# Patient Record
Sex: Female | Born: 1950
Health system: Southern US, Community
[De-identification: ages and names within clinical notes are randomized; demographics above are authoritative.]

## PROBLEM LIST (undated history)

## (undated) DIAGNOSIS — R519 Headache, unspecified: Secondary | ICD-10-CM

## (undated) DIAGNOSIS — C9000 Multiple myeloma not having achieved remission: Secondary | ICD-10-CM

## (undated) DIAGNOSIS — R51 Headache: Secondary | ICD-10-CM

## (undated) DIAGNOSIS — K56609 Unspecified intestinal obstruction, unspecified as to partial versus complete obstruction: Secondary | ICD-10-CM

## (undated) DIAGNOSIS — M545 Low back pain, unspecified: Secondary | ICD-10-CM

## (undated) DIAGNOSIS — D472 Monoclonal gammopathy: Secondary | ICD-10-CM

## (undated) HISTORY — PX: HERNIA REPAIR: SHX51

## (undated) HISTORY — PX: ABDOMINAL HYSTERECTOMY: SHX81

## (undated) HISTORY — PX: GASTRIC BYPASS: SHX52

## (undated) HISTORY — PX: BACK SURGERY: SHX140

## (undated) HISTORY — PX: TONSILLECTOMY: SUR1361

---

## 2008-10-15 DIAGNOSIS — K56609 Unspecified intestinal obstruction, unspecified as to partial versus complete obstruction: Secondary | ICD-10-CM

## 2008-10-15 HISTORY — DX: Unspecified intestinal obstruction, unspecified as to partial versus complete obstruction: K56.609

## 2009-06-04 HISTORY — PX: KNEE SURGERY: SHX244

## 2012-10-15 HISTORY — PX: OTHER SURGICAL HISTORY: SHX169

## 2014-03-04 ENCOUNTER — Ambulatory Visit: Payer: Self-pay | Admitting: Oncology

## 2014-03-09 LAB — URINE IEP, RANDOM

## 2014-03-10 LAB — KAPPA/LAMBDA FREE LIGHT CHAINS (ARMC)

## 2014-03-10 LAB — PROT IMMUNOELECTROPHORES(ARMC)

## 2014-03-15 ENCOUNTER — Ambulatory Visit: Payer: Self-pay | Admitting: Oncology

## 2014-05-26 ENCOUNTER — Encounter (HOSPITAL_COMMUNITY): Payer: Self-pay | Admitting: Emergency Medicine

## 2014-05-26 ENCOUNTER — Emergency Department (HOSPITAL_COMMUNITY)
Admission: EM | Admit: 2014-05-26 | Discharge: 2014-05-26 | Disposition: A | Discharge status: Home / Self Care | Attending: Emergency Medicine | Admitting: Emergency Medicine

## 2014-05-26 ENCOUNTER — Emergency Department (HOSPITAL_COMMUNITY)

## 2014-05-26 DIAGNOSIS — Z8719 Personal history of other diseases of the digestive system: Secondary | ICD-10-CM | POA: Insufficient documentation

## 2014-05-26 DIAGNOSIS — R519 Headache, unspecified: Secondary | ICD-10-CM

## 2014-05-26 DIAGNOSIS — R51 Headache: Secondary | ICD-10-CM | POA: Insufficient documentation

## 2014-05-26 DIAGNOSIS — Z8679 Personal history of other diseases of the circulatory system: Secondary | ICD-10-CM | POA: Insufficient documentation

## 2014-05-26 HISTORY — DX: Multiple myeloma not having achieved remission: C90.00

## 2014-05-26 HISTORY — DX: Unspecified intestinal obstruction, unspecified as to partial versus complete obstruction: K56.609

## 2014-05-26 HISTORY — DX: Monoclonal gammopathy: D47.2

## 2014-05-26 LAB — CBC WITH DIFFERENTIAL/PLATELET
BASOS ABS: 0 10*3/uL (ref 0.0–0.1)
Basophils Relative: 1 % (ref 0–1)
Eosinophils Absolute: 0.1 10*3/uL (ref 0.0–0.7)
Eosinophils Relative: 3 % (ref 0–5)
HEMATOCRIT: 34.9 % — AB (ref 36.0–46.0)
HEMOGLOBIN: 11.9 g/dL — AB (ref 12.0–15.0)
LYMPHS PCT: 47 % — AB (ref 12–46)
Lymphs Abs: 1.5 10*3/uL (ref 0.7–4.0)
MCH: 32.2 pg (ref 26.0–34.0)
MCHC: 34.1 g/dL (ref 30.0–36.0)
MCV: 94.3 fL (ref 78.0–100.0)
Monocytes Absolute: 0.2 10*3/uL (ref 0.1–1.0)
Monocytes Relative: 6 % (ref 3–12)
NEUTROS ABS: 1.3 10*3/uL — AB (ref 1.7–7.7)
Neutrophils Relative %: 43 % (ref 43–77)
Platelets: 151 10*3/uL (ref 150–400)
RBC: 3.7 MIL/uL — AB (ref 3.87–5.11)
RDW: 12.5 % (ref 11.5–15.5)
WBC: 3.1 10*3/uL — AB (ref 4.0–10.5)

## 2014-05-26 LAB — COMPREHENSIVE METABOLIC PANEL
ALT: 31 U/L (ref 0–35)
AST: 30 U/L (ref 0–37)
Albumin: 3.8 g/dL (ref 3.5–5.2)
Alkaline Phosphatase: 95 U/L (ref 39–117)
Anion gap: 10 (ref 5–15)
BILIRUBIN TOTAL: 0.6 mg/dL (ref 0.3–1.2)
BUN: 13 mg/dL (ref 6–23)
CHLORIDE: 105 meq/L (ref 96–112)
CO2: 25 meq/L (ref 19–32)
Calcium: 9.4 mg/dL (ref 8.4–10.5)
Creatinine, Ser: 0.9 mg/dL (ref 0.50–1.10)
GFR calc Af Amer: 78 mL/min — ABNORMAL LOW (ref >90)
GFR calc non Af Amer: 68 mL/min — ABNORMAL LOW (ref >90)
Glucose, Bld: 95 mg/dL (ref 70–99)
Potassium: 4.3 mEq/L (ref 3.7–5.3)
Sodium: 140 mEq/L (ref 137–147)
Total Protein: 7.3 g/dL (ref 6.0–8.3)

## 2014-05-26 MED ORDER — PREDNISONE 10 MG PO TABS: 20.0000 mg | ORAL_TABLET | Freq: Every day | ORAL | Status: DC

## 2014-05-26 NOTE — ED Provider Notes (Signed)
CSN: [id number]     Arrival date & time 05/26/14  4010 History  This chart was scribed for Madison Diego, MD by Starleen Arms, ED Scribe. This patient was seen in room APA04/APA04 and the patient's care was started at 9:06 AM.   Chief Complaint  Patient presents with  . Headache   Patient is a 63 y.o. female presenting with headaches. The history is provided by the patient. No language interpreter was used.  Headache Pain location:  R temporal Quality:  Sharp Radiates to:  R neck (right ear) Onset quality:  Gradual Duration:  1 day Timing:  Intermittent Progression:  Unchanged Chronicity:  New Similar to prior headaches: no (hx of migraines)   Relieved by:  NSAIDs Worsened by:  Nothing tried Associated symptoms: no abdominal pain, no back pain, no congestion, no cough, no diarrhea, no fatigue, no nausea, no photophobia, no seizures, no sinus pressure and no vomiting     HPI Comments: Carmeline Kowal is a 63 y.o. female with a history of migraines who presents to the Emergency Department complaining of intermittent (3 to 4 second episodes) of right temporal tenderness onset yesterday with associated "sharp waves" of pain that radiate to her left neck.  She states the radiation has changed to "shock waves" moving to her ears from her temple.  She also describes the pain as a light pressure.   Past Medical History  Diagnosis Date  . SBO (small bowel obstruction)    Past Surgical History  Procedure Laterality Date  . Knee surgery      both  . Abdominal hysterectomy    . Gastric bypass    . Hernia repair    . Abdominal surgery     No family history on file. History  Substance Use Topics  . Smoking status: Never Smoker   . Smokeless tobacco: Not on file  . Alcohol Use: Yes     Comment: occ   OB History   Grav Para Term Preterm Abortions TAB SAB Ect Mult Living                 Review of Systems  Constitutional: Negative for appetite change and fatigue.  HENT: Negative  for congestion, ear discharge and sinus pressure.   Eyes: Negative for photophobia, discharge and visual disturbance.  Respiratory: Negative for cough.   Cardiovascular: Negative for chest pain.  Gastrointestinal: Negative for nausea, vomiting, abdominal pain and diarrhea.  Genitourinary: Negative for frequency and hematuria.  Musculoskeletal: Negative for back pain.  Skin: Negative for rash.  Neurological: Positive for headaches. Negative for seizures.  Psychiatric/Behavioral: Negative for hallucinations.      Allergies  Review of patient's allergies indicates no known allergies.  Home Medications   Prior to Admission medications   Not on File   BP 165/95  Pulse 94  Temp(Src) 97.9 F (36.6 C) (Oral)  Resp 17  Ht 5\' 3"  (1.6 m)  Wt 230 lb (104.327 kg)  BMI 40.75 kg/m2  SpO2 99% Physical Exam  Nursing note and vitals reviewed. Constitutional: She is oriented to person, place, and time. She appears well-developed.  HENT:  Head: Normocephalic.  Eyes: Conjunctivae and EOM are normal. No scleral icterus.  Neck: Neck supple. No thyromegaly present.  Cardiovascular: Normal rate and regular rhythm.  Exam reveals no gallop and no friction rub.   No murmur heard. Pulmonary/Chest: No stridor. She has no wheezes. She has no rales. She exhibits no tenderness.  Abdominal: She exhibits no distension. There is  no tenderness. There is no rebound.  Musculoskeletal: Normal range of motion. She exhibits no edema.  Lymphadenopathy:    She has no cervical adenopathy.  Neurological: She is oriented to person, place, and time. She exhibits normal muscle tone. Coordination normal.  Skin: No rash noted. No erythema.  Psychiatric: She has a normal mood and affect. Her behavior is normal.    ED Course  Procedures (including critical care time)  DIAGNOSTIC STUDIES: Oxygen Saturation is 99% on RA, normal by my interpretation.    COORDINATION OF CARE:  9:13 AM Will order labs and imaging.   Patient acknowledges and agrees with plan.     Labs Review Labs Reviewed  CBC WITH DIFFERENTIAL  COMPREHENSIVE METABOLIC PANEL    Imaging Review No results found.   EKG Interpretation None      MDM   Final diagnoses:  None    The chart was scribed for me under my direct supervision.  I personally performed the history, physical, and medical decision making and all procedures in the evaluation of this patient.Madison Diego, MD 05/26/14 [phone]

## 2014-05-26 NOTE — ED Notes (Signed)
Pain and pressure to right side of head/temple starting yesterday.  Took ibuprofen with some relief, but did not go completely away.  Denies visual disturbances, denies n/v, denies light/sound sensitivity.

## 2014-05-26 NOTE — Discharge Instructions (Signed)
Follow up as needed with your md °

## 2014-06-03 ENCOUNTER — Ambulatory Visit: Payer: Self-pay | Admitting: Oncology

## 2014-06-03 LAB — BASIC METABOLIC PANEL
Anion Gap: 9 (ref 7–16)
BUN: 8 mg/dL (ref 7–18)
CHLORIDE: 105 mmol/L (ref 98–107)
Calcium, Total: 9.2 mg/dL (ref 8.5–10.1)
Co2: 28 mmol/L (ref 21–32)
Creatinine: 0.98 mg/dL (ref 0.60–1.30)
EGFR (African American): >60
EGFR (Non-African Amer.): >60
Glucose: 88 mg/dL (ref 65–99)
Osmolality: 281 (ref 275–301)
Potassium: 4 mmol/L (ref 3.5–5.1)
SODIUM: 142 mmol/L (ref 136–145)

## 2014-06-03 LAB — CBC CANCER CENTER
Basophil #: 0 x10 3/mm (ref 0.0–0.1)
Basophil %: 0.6 %
EOS ABS: 0.1 x10 3/mm (ref 0.0–0.7)
Eosinophil %: 2.3 %
HCT: 38.4 % (ref 35.0–47.0)
HGB: 12.5 g/dL (ref 12.0–16.0)
LYMPHS PCT: 40.3 %
Lymphocyte #: 1.3 x10 3/mm (ref 1.0–3.6)
MCH: 31.8 pg (ref 26.0–34.0)
MCHC: 32.5 g/dL (ref 32.0–36.0)
MCV: 98 fL (ref 80–100)
MONO ABS: 0.2 x10 3/mm (ref 0.2–0.9)
MONOS PCT: 7.6 %
Neutrophil #: 1.6 x10 3/mm (ref 1.4–6.5)
Neutrophil %: 49.2 %
PLATELETS: 171 x10 3/mm (ref 150–440)
RBC: 3.92 10*6/uL (ref 3.80–5.20)
RDW: 13.2 % (ref 11.5–14.5)
WBC: 3.2 x10 3/mm — ABNORMAL LOW (ref 3.6–11.0)

## 2014-06-04 LAB — URINE IEP, RANDOM

## 2014-06-07 LAB — PROT IMMUNOELECTROPHORES(ARMC)

## 2014-06-07 LAB — KAPPA/LAMBDA FREE LIGHT CHAINS (ARMC)

## 2014-06-15 ENCOUNTER — Ambulatory Visit: Payer: Self-pay | Admitting: Oncology

## 2014-08-17 ENCOUNTER — Other Ambulatory Visit (HOSPITAL_COMMUNITY): Payer: Self-pay | Admitting: Family Medicine

## 2014-08-17 DIAGNOSIS — Z1231 Encounter for screening mammogram for malignant neoplasm of breast: Secondary | ICD-10-CM

## 2014-08-27 ENCOUNTER — Ambulatory Visit (HOSPITAL_COMMUNITY)

## 2014-09-02 ENCOUNTER — Ambulatory Visit (HOSPITAL_COMMUNITY)

## 2014-09-28 ENCOUNTER — Ambulatory Visit: Payer: Self-pay | Admitting: Family Medicine

## 2014-12-09 ENCOUNTER — Ambulatory Visit: Payer: Self-pay | Admitting: Oncology

## 2014-12-14 ENCOUNTER — Ambulatory Visit
Admit: 2014-12-14 | Disposition: A | Discharge status: Home / Self Care | Payer: Self-pay | Attending: Oncology | Admitting: Oncology

## 2015-01-14 ENCOUNTER — Ambulatory Visit
Admit: 2015-01-14 | Disposition: A | Discharge status: Home / Self Care | Payer: Self-pay | Attending: Oncology | Admitting: Oncology

## 2015-06-15 ENCOUNTER — Other Ambulatory Visit: Payer: Self-pay | Admitting: Oncology

## 2015-06-15 DIAGNOSIS — D472 Monoclonal gammopathy: Secondary | ICD-10-CM

## 2015-06-16 ENCOUNTER — Inpatient Hospital Stay: Attending: Oncology

## 2015-07-28 ENCOUNTER — Inpatient Hospital Stay: Attending: Oncology

## 2015-07-28 DIAGNOSIS — C9000 Multiple myeloma not having achieved remission: Secondary | ICD-10-CM | POA: Diagnosis present

## 2015-07-28 DIAGNOSIS — D472 Monoclonal gammopathy: Secondary | ICD-10-CM

## 2015-07-28 LAB — BASIC METABOLIC PANEL
Anion gap: 6 (ref 5–15)
BUN: 13 mg/dL (ref 6–20)
CALCIUM: 8.6 mg/dL — AB (ref 8.9–10.3)
CO2: 24 mmol/L (ref 22–32)
CREATININE: 0.89 mg/dL (ref 0.44–1.00)
Chloride: 107 mmol/L (ref 101–111)
GFR calc non Af Amer: >60 mL/min (ref >60)
Glucose, Bld: 100 mg/dL — ABNORMAL HIGH (ref 65–99)
Potassium: 3.9 mmol/L (ref 3.5–5.1)
SODIUM: 137 mmol/L (ref 135–145)

## 2015-07-28 LAB — CBC
HCT: 35.4 % (ref 35.0–47.0)
Hemoglobin: 11.5 g/dL — ABNORMAL LOW (ref 12.0–16.0)
MCH: 30.7 pg (ref 26.0–34.0)
MCHC: 32.5 g/dL (ref 32.0–36.0)
MCV: 94.4 fL (ref 80.0–100.0)
Platelets: 137 10*3/uL — ABNORMAL LOW (ref 150–440)
RBC: 3.75 MIL/uL — ABNORMAL LOW (ref 3.80–5.20)
RDW: 13.8 % (ref 11.5–14.5)
WBC: 2.6 10*3/uL — ABNORMAL LOW (ref 3.6–11.0)

## 2015-07-29 LAB — KAPPA/LAMBDA LIGHT CHAINS
KAPPA, LAMDA LIGHT CHAIN RATIO: 19.41 — AB (ref 0.26–1.65)
Kappa free light chain: 321.83 mg/L — ABNORMAL HIGH (ref 3.30–19.40)
Lambda free light chains: 16.58 mg/L (ref 5.71–26.30)

## 2015-08-01 LAB — PROTEIN ELECTROPHORESIS, SERUM
A/G Ratio: 1.2 (ref 0.7–1.7)
ALPHA-2-GLOBULIN: 0.6 g/dL (ref 0.4–1.0)
Albumin ELP: 3.5 g/dL (ref 2.9–4.4)
Alpha-1-Globulin: 0.2 g/dL (ref 0.0–0.4)
Beta Globulin: 1.1 g/dL (ref 0.7–1.3)
GAMMA GLOBULIN: 1.1 g/dL (ref 0.4–1.8)
Globulin, Total: 3 g/dL (ref 2.2–3.9)
TOTAL PROTEIN ELP: 6.5 g/dL (ref 6.0–8.5)

## 2015-08-02 ENCOUNTER — Other Ambulatory Visit (HOSPITAL_COMMUNITY): Payer: Self-pay | Admitting: Family Medicine

## 2015-08-02 ENCOUNTER — Other Ambulatory Visit: Payer: Self-pay | Admitting: Family Medicine

## 2015-08-02 DIAGNOSIS — N951 Menopausal and female climacteric states: Secondary | ICD-10-CM

## 2015-08-02 DIAGNOSIS — Z1231 Encounter for screening mammogram for malignant neoplasm of breast: Secondary | ICD-10-CM

## 2015-08-31 ENCOUNTER — Telehealth: Payer: Self-pay | Admitting: *Deleted

## 2015-08-31 ENCOUNTER — Encounter (HOSPITAL_COMMUNITY): Payer: Self-pay | Admitting: Emergency Medicine

## 2015-08-31 ENCOUNTER — Emergency Department (HOSPITAL_COMMUNITY)
Admission: EM | Admit: 2015-08-31 | Discharge: 2015-08-31 | Disposition: A | Discharge status: Home / Self Care | Attending: Emergency Medicine | Admitting: Emergency Medicine

## 2015-08-31 ENCOUNTER — Emergency Department (HOSPITAL_COMMUNITY)

## 2015-08-31 DIAGNOSIS — Z8579 Personal history of other malignant neoplasms of lymphoid, hematopoietic and related tissues: Secondary | ICD-10-CM | POA: Diagnosis not present

## 2015-08-31 DIAGNOSIS — Z791 Long term (current) use of non-steroidal anti-inflammatories (NSAID): Secondary | ICD-10-CM | POA: Insufficient documentation

## 2015-08-31 DIAGNOSIS — N12 Tubulo-interstitial nephritis, not specified as acute or chronic: Secondary | ICD-10-CM | POA: Diagnosis not present

## 2015-08-31 DIAGNOSIS — R51 Headache: Secondary | ICD-10-CM | POA: Diagnosis not present

## 2015-08-31 DIAGNOSIS — R509 Fever, unspecified: Secondary | ICD-10-CM | POA: Diagnosis present

## 2015-08-31 HISTORY — DX: Headache, unspecified: R51.9

## 2015-08-31 HISTORY — DX: Low back pain: M54.5

## 2015-08-31 HISTORY — DX: Low back pain, unspecified: M54.50

## 2015-08-31 HISTORY — DX: Headache: R51

## 2015-08-31 LAB — CBC WITH DIFFERENTIAL/PLATELET
BASOS PCT: 0 %
Basophils Absolute: 0 10*3/uL (ref 0.0–0.1)
EOS ABS: 0 10*3/uL (ref 0.0–0.7)
EOS PCT: 0 %
HCT: 31.5 % — ABNORMAL LOW (ref 36.0–46.0)
Hemoglobin: 10.7 g/dL — ABNORMAL LOW (ref 12.0–15.0)
LYMPHS ABS: 1 10*3/uL (ref 0.7–4.0)
Lymphocytes Relative: 16 %
MCH: 32.6 pg (ref 26.0–34.0)
MCHC: 34 g/dL (ref 30.0–36.0)
MCV: 96 fL (ref 78.0–100.0)
MONOS PCT: 9 %
Monocytes Absolute: 0.6 10*3/uL (ref 0.1–1.0)
Neutro Abs: 4.6 10*3/uL (ref 1.7–7.7)
Neutrophils Relative %: 75 %
PLATELETS: 136 10*3/uL — AB (ref 150–400)
RBC: 3.28 MIL/uL — ABNORMAL LOW (ref 3.87–5.11)
RDW: 13.8 % (ref 11.5–15.5)
WBC: 6.1 10*3/uL (ref 4.0–10.5)

## 2015-08-31 LAB — URINALYSIS, ROUTINE W REFLEX MICROSCOPIC
BILIRUBIN URINE: NEGATIVE
Glucose, UA: NEGATIVE mg/dL
KETONES UR: 15 mg/dL — AB
Nitrite: NEGATIVE
Protein, ur: 100 mg/dL — AB
Specific Gravity, Urine: 1.025 (ref 1.005–1.030)
pH: 5.5 (ref 5.0–8.0)

## 2015-08-31 LAB — URINE MICROSCOPIC-ADD ON

## 2015-08-31 LAB — COMPREHENSIVE METABOLIC PANEL
ALBUMIN: 3.5 g/dL (ref 3.5–5.0)
ALK PHOS: 146 U/L — AB (ref 38–126)
ALT: 106 U/L — ABNORMAL HIGH (ref 14–54)
ANION GAP: 11 (ref 5–15)
AST: 84 U/L — ABNORMAL HIGH (ref 15–41)
BUN: 15 mg/dL (ref 6–20)
CALCIUM: 9.1 mg/dL (ref 8.9–10.3)
CHLORIDE: 102 mmol/L (ref 101–111)
CO2: 23 mmol/L (ref 22–32)
Creatinine, Ser: 0.85 mg/dL (ref 0.44–1.00)
GFR calc non Af Amer: >60 mL/min (ref >60)
Glucose, Bld: 114 mg/dL — ABNORMAL HIGH (ref 65–99)
POTASSIUM: 3.7 mmol/L (ref 3.5–5.1)
SODIUM: 136 mmol/L (ref 135–145)
Total Bilirubin: 1.4 mg/dL — ABNORMAL HIGH (ref 0.3–1.2)
Total Protein: 6.9 g/dL (ref 6.5–8.1)

## 2015-08-31 LAB — I-STAT CG4 LACTIC ACID, ED: Lactic Acid, Venous: 0.8 mmol/L (ref 0.5–2.0)

## 2015-08-31 MED ORDER — ACETAMINOPHEN 500 MG PO TABS
1000.0000 mg | ORAL_TABLET | Freq: Once | ORAL | Status: AC
  Administered 2015-08-31: 1000 mg via ORAL
  Filled 2015-08-31: qty 2

## 2015-08-31 MED ORDER — ONDANSETRON HCL 4 MG/2ML IJ SOLN
4.0000 mg | Freq: Once | INTRAMUSCULAR | Status: AC
  Administered 2015-08-31: 4 mg via INTRAVENOUS
  Filled 2015-08-31: qty 2

## 2015-08-31 MED ORDER — DEXTROSE 5 % IV SOLN
1.0000 g | Freq: Once | INTRAVENOUS | Status: AC
  Administered 2015-08-31: 1 g via INTRAVENOUS
  Filled 2015-08-31: qty 10

## 2015-08-31 MED ORDER — MORPHINE SULFATE (PF) 4 MG/ML IV SOLN
4.0000 mg | INTRAVENOUS | Status: DC | PRN
  Administered 2015-08-31 (×2): 4 mg via INTRAVENOUS
  Filled 2015-08-31 (×2): qty 1

## 2015-08-31 MED ORDER — ONDANSETRON 4 MG PO TBDP: 4.0000 mg | ORAL_TABLET | Freq: Three times a day (TID) | ORAL | Status: DC | PRN

## 2015-08-31 MED ORDER — ONDANSETRON HCL 4 MG/2ML IJ SOLN
INTRAMUSCULAR | Status: AC
  Filled 2015-08-31: qty 2

## 2015-08-31 MED ORDER — OXYCODONE-ACETAMINOPHEN 5-325 MG PO TABS: 2.0000 | ORAL_TABLET | ORAL | Status: DC | PRN

## 2015-08-31 MED ORDER — CIPROFLOXACIN HCL 500 MG PO TABS: 500.0000 mg | ORAL_TABLET | Freq: Two times a day (BID) | ORAL | Status: DC

## 2015-08-31 MED ORDER — SODIUM CHLORIDE 0.9 % IV BOLUS (SEPSIS)
1000.0000 mL | Freq: Once | INTRAVENOUS | Status: AC
  Administered 2015-08-31: 1000 mL via INTRAVENOUS

## 2015-08-31 MED ORDER — HYDROMORPHONE HCL 1 MG/ML IJ SOLN: 1.0000 mg | Freq: Once | INTRAMUSCULAR | Status: DC

## 2015-08-31 MED ORDER — SODIUM CHLORIDE 0.9 % IV SOLN
Freq: Once | INTRAVENOUS | Status: DC
Start: 2015-08-31 — End: 2015-08-31

## 2015-08-31 NOTE — Telephone Encounter (Signed)
Patient called back and states that she has had pain for a while, but it has been getting progressively worse and it is so bad this morning that she almost passed out with the pain in her low back

## 2015-08-31 NOTE — ED Notes (Signed)
Patient states to writer she does not need anything else for pain prior to d/c. Dr. Jeneen Rinks aware. No new orders given.

## 2015-08-31 NOTE — ED Provider Notes (Signed)
CSN: [id number]     Arrival date & time 08/31/15  1739 History   First MD Initiated Contact with Patient 08/31/15 1749     Chief Complaint  Patient presents with  . Back Pain  . Fever  . Headache      HPI  Patient persist evaluation of back pain fever, and headache.  Reports back pain for the last several months. Is followed with her primary care physician regarding this. Takes Celebrex daily. She reports that it has been worsening over the last several months. She attributes some of the pain to a bone biopsy obtained for evaluation for multiple myeloma a few years ago. She states she has been told that she has "smoldering multiple myeloma". Apparently they're following this with serial labs and has been stable.  Last 3 days she's had shakes and chills and fatigue. Has had headache since yesterday as well. Shakes and chills today home and her husband checked her temperature and found it was "100.7". Presents here 101.5 complaining of headache back pain and weakness.  She denies any pain with movement of her neck her neck difficulties. No sore throat no cough or difficult breathing no GI complaints.  Sates her urine had a strong odor this morning and yesterday as well.  Past Medical History  Diagnosis Date  . SBO (small bowel obstruction) ([place])   . Smoldering multiple myeloma ([place])   . Low back pain   . Headache    Past Surgical History  Procedure Laterality Date  . Knee surgery      both  . Abdominal hysterectomy    . Gastric bypass    . Hernia repair    . Abdominal surgery     History reviewed. No pertinent family history. Social History  Substance Use Topics  . Smoking status: Never Smoker   . Smokeless tobacco: None  . Alcohol Use: Yes     Comment: occ   OB History    No data available     Review of Systems  Constitutional: Positive for fever, chills and fatigue. Negative for diaphoresis and appetite change.  HENT: Negative for mouth sores, sore throat and  trouble swallowing.   Eyes: Negative for visual disturbance.  Respiratory: Negative for cough, chest tightness, shortness of breath and wheezing.   Cardiovascular: Negative for chest pain.  Gastrointestinal: Negative for nausea, vomiting, abdominal pain, diarrhea and abdominal distention.  Endocrine: Negative for polydipsia, polyphagia and polyuria.  Genitourinary: Negative for dysuria, frequency and hematuria.  Musculoskeletal: Positive for back pain. Negative for gait problem.  Skin: Negative for color change, pallor and rash.  Neurological: Positive for headaches. Negative for dizziness, syncope and light-headedness.  Hematological: Does not bruise/bleed easily.  Psychiatric/Behavioral: Negative for behavioral problems and confusion.      Allergies  Codeine  Home Medications   Prior to Admission medications   Medication Sig Start Date End Date Taking? Authorizing Provider  celecoxib (CELEBREX) 200 MG capsule Take 200 mg by mouth daily. 07/29/15  Yes Historical Provider, MD  ciprofloxacin (CIPRO) 500 MG tablet Take 1 tablet (500 mg total) by mouth every 12 (twelve) hours. 08/31/15   Tanna Furry, MD  ondansetron (ZOFRAN ODT) 4 MG disintegrating tablet Take 1 tablet (4 mg total) by mouth every 8 (eight) hours as needed for nausea. 08/31/15   Tanna Furry, MD  oxyCODONE-acetaminophen (PERCOCET/ROXICET) 5-325 MG tablet Take 2 tablets by mouth every 4 (four) hours as needed. 08/31/15   Tanna Furry, MD  predniSONE (DELTASONE) 10 MG tablet  Take 2 tablets (20 mg total) by mouth daily. Patient not taking: Reported on 08/31/2015 05/26/14   Milton Ferguson, MD   BP 142/68 mmHg  Pulse 117  Temp(Src) 101.5 F (38.6 C) (Oral)  Resp 18  Ht _0  (1.6 m)  Wt 235 lb (106.595 kg)  BMI 41.64 kg/m2  SpO2 100% Physical Exam  Constitutional: She is oriented to person, place, and time. She appears well-developed and well-nourished. No distress.  HENT:  Head: Normocephalic.  Normal HEENT exam. Reports  bifrontal headache. Supple neck. Normal pharynx.  Eyes: Conjunctivae are normal. Pupils are equal, round, and reactive to light. No scleral icterus.  Neck: Normal range of motion. Neck supple. No thyromegaly present.  No meningismus. Freely moves her neck during conversation, and with exam.  Cardiovascular: Normal rate and regular rhythm.  Exam reveals no gallop and no friction rub.   No murmur heard. Pulmonary/Chest: Effort normal and breath sounds normal. No respiratory distress. She has no wheezes. She has no rales.  Clear bilateral breath sounds. No focal diminished breath sound. No crackles or rales.  Abdominal: Soft. Bowel sounds are normal. She exhibits no distension. There is no tenderness. There is no rebound.  Musculoskeletal: Normal range of motion.       Back:  Diffuse paraspinal low back pain. No frank CVA tenderness.  Neurological: She is alert and oriented to person, place, and time.  Strength and sensation of the 4 extremity. Normal gait.  Skin: Skin is warm and dry. No rash noted.  Psychiatric: She has a normal mood and affect. Her behavior is normal.    ED Course  Procedures (including critical care time) Labs Review Labs Reviewed  CBC WITH DIFFERENTIAL/PLATELET - Abnormal; Notable for the following:    RBC 3.28 (*)    Hemoglobin 10.7 (*)    HCT 31.5 (*)    Platelets 136 (*)    All other components within normal limits  COMPREHENSIVE METABOLIC PANEL - Abnormal; Notable for the following:    Glucose, Bld 114 (*)    AST 84 (*)    ALT 106 (*)    Alkaline Phosphatase 146 (*)    Total Bilirubin 1.4 (*)    All other components within normal limits  URINALYSIS, ROUTINE W REFLEX MICROSCOPIC (NOT AT [facility]) - Abnormal; Notable for the following:    APPearance HAZY (*)    Hgb urine dipstick LARGE (*)    Ketones, ur 15 (*)    Protein, ur 100 (*)    Leukocytes, UA SMALL (*)    All other components within normal limits  URINE MICROSCOPIC-ADD ON - Abnormal; Notable for  the following:    Squamous Epithelial / LPF 0-5 (*)    Bacteria, UA MANY (*)    All other components within normal limits  URINE CULTURE  I-STAT CG4 LACTIC ACID, ED    Imaging Review Dg Chest 2 View  08/31/2015  CLINICAL DATA:  Fever, back pain. EXAM: CHEST  2 VIEW COMPARISON:  None. FINDINGS: The heart size and mediastinal contours are within normal limits. Both lungs are clear. No pneumothorax or pleural effusion is noted. The visualized skeletal structures are unremarkable. IMPRESSION: No active cardiopulmonary disease. Electronically Signed   By: Marijo Conception, M.D.   On: 08/31/2015 18:38   I have personally reviewed and evaluated these images and lab results as part of my medical decision-making.   EKG Interpretation None      MDM   Final diagnoses:  Pyelonephritis  Supple neck. No pain with movement. Doubt meningitis. Clear lungs. Normal x-ray. No evidence of pneumonia. Not hypoxemic. Benign abdomen. Her pain in her back seems chronic. She isn't have neurological symptoms. Urine appears infected and she is symptomatic describes frequency and abnormal odor. Given IV Rocephin. Plan will be home, rest, push fluids, pain control Percocet, primary care follow-up regarding her chronic back pain, Cipro for UTI.    Tanna Furry, MD 08/31/15 2018

## 2015-08-31 NOTE — Discharge Instructions (Signed)

## 2015-08-31 NOTE — Telephone Encounter (Signed)
Patient has not been seen since December 16, 2014, at which point it appeared that her MM was either in remission or  minimal disease.  She did not complain of back pain at that visit. She also requested less frequent follow up at that time.  Because her pain has been progressive and she nearly passed out, I think she should be evaluated by her PCP or an Urgent Care/ER.

## 2015-08-31 NOTE — Telephone Encounter (Signed)
Called Answering service at [phone] to report extreme back pain. I attempted calling her back and got her VM

## 2015-08-31 NOTE — Telephone Encounter (Signed)
Called patietn adn advised her to either go to ER, PCP or Urgent care per Dr Grayland Ormond and she thanked me for calling and said she will go get checked

## 2015-08-31 NOTE — ED Notes (Signed)
Having lower back pain for last 2 weeks, rates pain 9/10.  Seen by PCP in Oct for back pain and was given oxycodone and pt says she only took med three without relief.  C/o headache for last three days and fever today 100.0 at home.  Currently 101.5.

## 2015-09-02 LAB — URINE CULTURE

## 2015-10-03 ENCOUNTER — Ambulatory Visit

## 2015-10-03 ENCOUNTER — Encounter

## 2015-10-13 ENCOUNTER — Other Ambulatory Visit

## 2015-10-13 ENCOUNTER — Ambulatory Visit

## 2015-10-19 ENCOUNTER — Ambulatory Visit
Admission: RE | Admit: 2015-10-19 | Discharge: 2015-10-19 | Disposition: A | Discharge status: Home / Self Care | Source: Ambulatory Visit | Attending: Family Medicine | Admitting: Family Medicine

## 2015-10-19 DIAGNOSIS — N959 Unspecified menopausal and perimenopausal disorder: Secondary | ICD-10-CM | POA: Insufficient documentation

## 2015-10-19 DIAGNOSIS — Z1231 Encounter for screening mammogram for malignant neoplasm of breast: Secondary | ICD-10-CM | POA: Insufficient documentation

## 2015-10-19 DIAGNOSIS — N951 Menopausal and female climacteric states: Secondary | ICD-10-CM

## 2015-11-22 DIAGNOSIS — M25512 Pain in left shoulder: Secondary | ICD-10-CM | POA: Insufficient documentation

## 2015-11-22 DIAGNOSIS — M25552 Pain in left hip: Secondary | ICD-10-CM | POA: Insufficient documentation

## 2015-11-22 DIAGNOSIS — M47816 Spondylosis without myelopathy or radiculopathy, lumbar region: Secondary | ICD-10-CM | POA: Insufficient documentation

## 2015-11-22 DIAGNOSIS — D649 Anemia, unspecified: Secondary | ICD-10-CM | POA: Insufficient documentation

## 2015-12-08 ENCOUNTER — Inpatient Hospital Stay: Attending: Oncology

## 2015-12-08 ENCOUNTER — Other Ambulatory Visit: Payer: Self-pay | Admitting: *Deleted

## 2015-12-08 DIAGNOSIS — C9000 Multiple myeloma not having achieved remission: Secondary | ICD-10-CM | POA: Diagnosis not present

## 2015-12-08 LAB — BASIC METABOLIC PANEL
Anion gap: 6 (ref 5–15)
BUN: 10 mg/dL (ref 6–20)
CALCIUM: 9.2 mg/dL (ref 8.9–10.3)
CO2: 24 mmol/L (ref 22–32)
CREATININE: 0.76 mg/dL (ref 0.44–1.00)
Chloride: 107 mmol/L (ref 101–111)
GFR calc Af Amer: >60 mL/min (ref >60)
GLUCOSE: 97 mg/dL (ref 65–99)
POTASSIUM: 3.7 mmol/L (ref 3.5–5.1)
SODIUM: 137 mmol/L (ref 135–145)

## 2015-12-08 LAB — CBC WITH DIFFERENTIAL/PLATELET
Basophils Absolute: 0 10*3/uL (ref 0–0.1)
Basophils Relative: 1 %
EOS ABS: 0 10*3/uL (ref 0–0.7)
EOS PCT: 1 %
HCT: 33.4 % — ABNORMAL LOW (ref 35.0–47.0)
Hemoglobin: 11.1 g/dL — ABNORMAL LOW (ref 12.0–16.0)
LYMPHS ABS: 1.2 10*3/uL (ref 1.0–3.6)
LYMPHS PCT: 41 %
MCH: 31.4 pg (ref 26.0–34.0)
MCHC: 33.2 g/dL (ref 32.0–36.0)
MCV: 94.6 fL (ref 80.0–100.0)
MONO ABS: 0.2 10*3/uL (ref 0.2–0.9)
MONOS PCT: 6 %
Neutro Abs: 1.5 10*3/uL (ref 1.4–6.5)
Neutrophils Relative %: 51 %
PLATELETS: 140 10*3/uL — AB (ref 150–440)
RBC: 3.53 MIL/uL — ABNORMAL LOW (ref 3.80–5.20)
RDW: 14.6 % — AB (ref 11.5–14.5)
WBC: 2.9 10*3/uL — AB (ref 3.6–11.0)

## 2015-12-09 LAB — KAPPA/LAMBDA LIGHT CHAINS
Kappa free light chain: 310.31 mg/L — ABNORMAL HIGH (ref 3.30–19.40)
Kappa, lambda light chain ratio: 24.38 — ABNORMAL HIGH (ref 0.26–1.65)
Lambda free light chains: 12.73 mg/L (ref 5.71–26.30)

## 2015-12-09 LAB — PROTEIN ELECTROPHORESIS, SERUM
A/G Ratio: 1.3 (ref 0.7–1.7)
ALPHA-1-GLOBULIN: 0.2 g/dL (ref 0.0–0.4)
ALPHA-2-GLOBULIN: 0.7 g/dL (ref 0.4–1.0)
Albumin ELP: 3.5 g/dL (ref 2.9–4.4)
BETA GLOBULIN: 1.1 g/dL (ref 0.7–1.3)
GAMMA GLOBULIN: 0.8 g/dL (ref 0.4–1.8)
Globulin, Total: 2.8 g/dL (ref 2.2–3.9)
Total Protein ELP: 6.3 g/dL (ref 6.0–8.5)

## 2015-12-09 LAB — PROTEIN ELECTRO, RANDOM URINE
ALBUMIN ELP UR: 10.4 %
ALPHA-1-GLOBULIN, U: 1.9 %
ALPHA-2-GLOBULIN, U: 8.6 %
BETA GLOBULIN, U: 21.5 %
Gamma Globulin, U: 57.6 %
M SPIKE UR: 43.9 % — AB
Total Protein, Urine: 17.5 mg/dL

## 2015-12-15 ENCOUNTER — Inpatient Hospital Stay: Payer: PRIVATE HEALTH INSURANCE | Admitting: Oncology

## 2016-01-03 ENCOUNTER — Inpatient Hospital Stay: Admitting: Oncology

## 2016-01-18 DIAGNOSIS — M1612 Unilateral primary osteoarthritis, left hip: Secondary | ICD-10-CM | POA: Insufficient documentation

## 2016-02-02 DIAGNOSIS — Z9884 Bariatric surgery status: Secondary | ICD-10-CM | POA: Insufficient documentation

## 2016-11-08 ENCOUNTER — Ambulatory Visit (HOSPITAL_COMMUNITY)
Admission: RE | Admit: 2016-11-08 | Discharge: 2016-11-08 | Disposition: A | Discharge status: Home / Self Care | Source: Ambulatory Visit | Attending: Hematology | Admitting: Hematology

## 2016-11-08 ENCOUNTER — Ambulatory Visit (HOSPITAL_BASED_OUTPATIENT_CLINIC_OR_DEPARTMENT_OTHER): Admitting: Hematology

## 2016-11-08 ENCOUNTER — Telehealth: Payer: Self-pay | Admitting: Hematology

## 2016-11-08 ENCOUNTER — Encounter: Payer: Self-pay | Admitting: *Deleted

## 2016-11-08 ENCOUNTER — Encounter: Payer: Self-pay | Admitting: Hematology

## 2016-11-08 ENCOUNTER — Ambulatory Visit (HOSPITAL_BASED_OUTPATIENT_CLINIC_OR_DEPARTMENT_OTHER)

## 2016-11-08 VITALS — BP 141/79 | HR 76 | Temp 97.8°F | Resp 18 | Ht 63.0 in | Wt 215.8 lb

## 2016-11-08 DIAGNOSIS — C9000 Multiple myeloma not having achieved remission: Secondary | ICD-10-CM | POA: Diagnosis not present

## 2016-11-08 DIAGNOSIS — M899 Disorder of bone, unspecified: Secondary | ICD-10-CM | POA: Diagnosis not present

## 2016-11-08 DIAGNOSIS — M160 Bilateral primary osteoarthritis of hip: Secondary | ICD-10-CM | POA: Diagnosis not present

## 2016-11-08 DIAGNOSIS — Z9889 Other specified postprocedural states: Secondary | ICD-10-CM | POA: Diagnosis not present

## 2016-11-08 DIAGNOSIS — D709 Neutropenia, unspecified: Secondary | ICD-10-CM | POA: Diagnosis not present

## 2016-11-08 DIAGNOSIS — D472 Monoclonal gammopathy: Secondary | ICD-10-CM

## 2016-11-08 DIAGNOSIS — E559 Vitamin D deficiency, unspecified: Secondary | ICD-10-CM | POA: Diagnosis not present

## 2016-11-08 DIAGNOSIS — D649 Anemia, unspecified: Secondary | ICD-10-CM

## 2016-11-08 DIAGNOSIS — E039 Hypothyroidism, unspecified: Secondary | ICD-10-CM

## 2016-11-08 DIAGNOSIS — M5417 Radiculopathy, lumbosacral region: Secondary | ICD-10-CM | POA: Diagnosis not present

## 2016-11-08 LAB — CBC & DIFF AND RETIC
BASO%: 0.4 % (ref 0.0–2.0)
BASOS ABS: 0 10*3/uL (ref 0.0–0.1)
EOS%: 1.1 % (ref 0.0–7.0)
Eosinophils Absolute: 0 10*3/uL (ref 0.0–0.5)
HCT: 32.8 % — ABNORMAL LOW (ref 34.8–46.6)
HGB: 10.8 g/dL — ABNORMAL LOW (ref 11.6–15.9)
Immature Retic Fract: 10.3 % — ABNORMAL HIGH (ref 1.60–10.00)
LYMPH#: 1.4 10*3/uL (ref 0.9–3.3)
LYMPH%: 49.8 % — ABNORMAL HIGH (ref 14.0–49.7)
MCH: 30.7 pg (ref 25.1–34.0)
MCHC: 32.9 g/dL (ref 31.5–36.0)
MCV: 93.2 fL (ref 79.5–101.0)
MONO#: 0.1 10*3/uL (ref 0.1–0.9)
MONO%: 4.4 % (ref 0.0–14.0)
NEUT%: 44.3 % (ref 38.4–76.8)
NEUTROS ABS: 1.2 10*3/uL — AB (ref 1.5–6.5)
Platelets: 100 10*3/uL — ABNORMAL LOW (ref 145–400)
RBC: 3.52 10*6/uL — ABNORMAL LOW (ref 3.70–5.45)
RDW: 13.2 % (ref 11.2–14.5)
RETIC %: 1 % (ref 0.70–2.10)
RETIC CT ABS: 35.2 10*3/uL (ref 33.70–90.70)
WBC: 2.7 10*3/uL — AB (ref 3.9–10.3)

## 2016-11-08 LAB — COMPREHENSIVE METABOLIC PANEL
ALT: 21 U/L (ref 0–55)
AST: 25 U/L (ref 5–34)
Albumin: 4.3 g/dL (ref 3.5–5.0)
Alkaline Phosphatase: 117 U/L (ref 40–150)
Anion Gap: 11 mEq/L (ref 3–11)
BUN: 9.2 mg/dL (ref 7.0–26.0)
CHLORIDE: 107 meq/L (ref 98–109)
CO2: 22 meq/L (ref 22–29)
CREATININE: 0.9 mg/dL (ref 0.6–1.1)
Calcium: 9.7 mg/dL (ref 8.4–10.4)
EGFR: 70 mL/min/{1.73_m2} — ABNORMAL LOW (ref >90)
Glucose: 81 mg/dl (ref 70–140)
Potassium: 4.2 mEq/L (ref 3.5–5.1)
SODIUM: 139 meq/L (ref 136–145)
Total Bilirubin: 0.83 mg/dL (ref 0.20–1.20)
Total Protein: 7.4 g/dL (ref 6.4–8.3)

## 2016-11-08 LAB — LACTATE DEHYDROGENASE: LDH: 220 U/L (ref 125–245)

## 2016-11-08 LAB — FERRITIN: Ferritin: 112 ng/ml (ref 9–269)

## 2016-11-08 NOTE — Progress Notes (Signed)
Mar[facility] Kitchen    HEMATOLOGY/ONCOLOGY CONSULTATION NOTE  Date of Service: 11/08/2016  PCP: Marzetta Board MD  CHIEF COMPLAINTS/PURPOSE OF CONSULTATION:  Smoldering multiple myeloma  HISTORY OF PRESENTING ILLNESS:   Madison Parker is a wonderful 66 y.o. 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  Patient has a history of smoldering multiple myeloma which she reports she was diagnosed with in 2011 when she was evaluated by a hematologist in [place]. She reports that she presented with low blood counts (low WBC)   and after significant workup she had a bone marrow examination based on which she was told that she has smoldering multiple myeloma. Patient notes that she had a bone survey at that time which was negative. She was monitored there closely and then moved to [facility] in 2015 to stay with her son whose wife was being treated for breast cancer. Patient was following with Dr. Delight Hoh in [place]. She reports not having had any treatment for multiple myeloma.  Her last labs from about a year ago showed a SPEP with no OBSERVED M spike . Serum free light chains showed an elevation of kappa Free light chain 310 and lambda free light chain of 12.7 with an abnormal Kappa/ Lambda ratio of 24.38 ( up from 19 previously). Random urine showed an M protein complement of 44%.  She lives in [place] and requested transfer of care to [place]. She was offered follow-up but [place] in [place]  But prefers to  follow [place] in [place]. Patient reports her energy levels been stable. She has chronic back pain which has improved since the patient had her spinal surgery in April 2017 (L4-L5 interbody fusion). Patient notes she has had some chronic left hip pain which is somewhat more bothersome with the navy on the lateral aspect of her upper thighs that's painful. She also notes some right hip pain.  Over the last few weeks he notes some upper neck pain especially when bending her neck backwards and tingling numbness in her right upper extremity.  Has been working on losing some weight voluntarily.  No acute other new symptoms.   MEDICAL HISTORY:  Past Medical History:  Diagnosis Date  . Headache   . Low back pain   . SBO (small bowel obstruction)   . Smoldering multiple myeloma ([facility])   Previous history of hypothyroidism- not currently medications Anxiety Obesity .Body mass index is 38.23 kg/m. Vitamin D deficiency Smoldering multiple myeloma diagnosed in 2011 Lumbosacral radiculopathy Left hip pain  SURGICAL HISTORY: Past Surgical History:  Procedure Laterality Date  . ABDOMINAL HYSTERECTOMY    . ABDOMINAL SURGERY    . GASTRIC BYPASS    . HERNIA REPAIR    . KNEE SURGERY     both  Spinal surgery April 2017  SOCIAL HISTORY: Social History   Social History  . Marital status: Married    Spouse name: N/A  . Number of children: N/A  . Years of education: N/A   Occupational History  . Not on file.   Social History Main Topics  . Smoking status: Never Smoker  . Smokeless tobacco: Not on file  . Alcohol use Yes     Comment: occ  . Drug use: No  . Sexual activity: Not on file   Other Topics Concern  . Not on file   Social History Narrative  . No narrative on file  Occasional alcohol use  Former smoker and smoked 1 pack per week for about 9 years has since quit.  FAMILY HISTORY: No family history on file.  ALLERGIES:  is allergic to codeine.  MEDICATIONS:  Current Outpatient Prescriptions  Medication Sig Dispense Refill  . celecoxib (CELEBREX) 200 MG capsule Take 200 mg by mouth daily.    . ciprofloxacin (CIPRO) 500 MG tablet Take 1 tablet (500 mg total) by mouth every 12 (twelve) hours. 20 tablet 0  . ondansetron (ZOFRAN ODT) 4 MG disintegrating tablet Take 1 tablet (4 mg total) by mouth every 8 (eight) hours as needed for nausea. 6 tablet 0  .  oxyCODONE-acetaminophen (PERCOCET/ROXICET) 5-325 MG tablet Take 2 tablets by mouth every 4 (four) hours as needed. 20 tablet 0  . predniSONE (DELTASONE) 10 MG tablet Take 2 tablets (20 mg total) by mouth daily. (Patient not taking: Reported on 08/31/2015) 6 tablet 0   No current facility-administered medications for this visit.     REVIEW OF SYSTEMS:    10 Point review of Systems was done is negative except as noted above.  PHYSICAL EXAMINATION: ECOG PERFORMANCE STATUS: 1 - Symptomatic but completely ambulatory  . Vitals:   11/08/16 1115  BP: (!) 141/79  Pulse: 76  Resp: 18  Temp: 97.8 F (36.6 C)   Filed Weights   11/08/16 1115  Weight: 215 lb 12.8 oz (97.9 kg)   .Body mass index is 38.23 kg/m.  GENERAL:alert, in no acute distress and comfortable SKIN: skin color, texture, turgor are normal, no rashes or significant lesions EYES: normal, conjunctiva are pink and non-injected, sclera clear OROPHARYNX:no exudate, no erythema and lips, buccal mucosa, and tongue normal  NECK: supple, no JVD, thyroid normal size, non-tender, without nodularity LYMPH:  no palpable lymphadenopathy in the cervical, axillary or inguinal LUNGS: clear to auscultation with normal respiratory effort HEART: regular rate & rhythm,  no murmurs and no lower extremity edema ABDOMEN: abdomen soft, non-tender, normoactive bowel sounds  Musculoskeletal: no cyanosis of digits and no clubbing  PSYCH: alert & oriented x 3 with fluent speech NEURO: no focal motor/sensory deficits  LABORATORY DATA:  I have reviewed the data as listed  . CBC Latest Ref Rng & Units 12/08/2015 08/31/2015 07/28/2015  WBC 3.6 - 11.0 K/uL 2.9(L) 6.1 2.6(L)  Hemoglobin 12.0 - 16.0 g/dL 11.1(L) 10.7(L) 11.5(L)  Hematocrit 35.0 - 47.0 % 33.4(L) 31.5(L) 35.4  Platelets 150 - 440 K/uL 140(L) 136(L) 137(L)    . CMP Latest Ref Rng & Units 12/08/2015 08/31/2015 07/28/2015  Glucose 65 - 99 mg/dL 97 114(H) 100(H)  BUN 6 - 20 mg/dL 10 15  13   Creatinine 0.44 - 1.00 mg/dL 0.76 0.85 0.89  Sodium 135 - 145 mmol/L 137 136 137  Potassium 3.5 - 5.1 mmol/L 3.7 3.7 3.9  Chloride 101 - 111 mmol/L 107 102 107  CO2 22 - 32 mmol/L 24 23 24   Calcium 8.9 - 10.3 mg/dL 9.2 9.1 8.6(L)  Total Protein 6.5 - 8.1 g/dL - 6.9 -  Total Bilirubin 0.3 - 1.2 mg/dL - 1.4(H) -  Alkaline Phos 38 - 126 U/L - 146(H) -  AST 15 - 41 U/L - 84(H) -  ALT 14 - 54 U/L - 106(H) -         RADIOGRAPHIC STUDIES: I have personally reviewed the radiological images as listed and agreed with the findings in the report. No results found.  ASSESSMENT & PLAN:   66 year old very pleasant lady with history of   1) Smoldering Multiple Myeloma - (  appears light chain producing) This was apparently diagnosed in 2011 by her hematologist in [place]. Last couple of SPEP's showed no M spike but she had Increased serum kappa light chains with a K/L ratio of 24. UPEP with M protein. No treatment so far. Recent labs a primary care physician from 10/28/2015 included a CBC with hemoglobin of 12.4, WBC count of 2.5k with an ANC of 875, platelet count of 182k. CMP showed all normal. No 0.87, no hypercalcemia with calcium of 9.7 total protein 7.6 albumin 4.9. 2) Chronic leukopenia - ? Related to SMM vs other nutritional deficiencies (h/o gastric bypass surgery put her at risk for nutritional deficiencies) PLAN -We'll get repeat labs including CBC, CMP -Myeloma panel, serum free light chains, LDH, beta-2 microglobulin. -24h UPEP -Given her neck pain and hip pains will get a whole body skeletal survey. -Will try to contact her hematologist at [facility] to try to get her previous hematology records and on marrow biopsy results. -Given any concern on labs or skeletal survey might need to repeat a bone marrow biopsy.  We will set her up for return to clinic with Dr. Irene Limbo in 4 months, possibly earlier based on results of her labs and all body  survey.  All of the patients questions were answered with apparent satisfaction. The patient knows to call the clinic with any problems, questions or concerns.  I spent 45 minutes counseling the patient face to face. The total time spent in the appointment was 60 minutes and more than 50% was on counseling and direct patient cares.    Sullivan Lone MD [place] AAHIVMS [facility] Hematology/Oncology Physician [facility]  (Office):       [phone] (Work cell):  [phone] (Fax):           [phone]  11/08/2016 11:33 AM

## 2016-11-08 NOTE — Telephone Encounter (Signed)
Appointments scheduled per 1/25 LOS. Patient given AVS report and calendars with future scheduled appointments. °

## 2016-11-09 LAB — VITAMIN B12: VITAMIN B 12: 867 pg/mL (ref 232–1245)

## 2016-11-09 LAB — KAPPA/LAMBDA LIGHT CHAINS
Ig Kappa Free Light Chain: 423 mg/L — ABNORMAL HIGH (ref 3.3–19.4)
Ig Lambda Free Light Chain: 18.4 mg/L (ref 5.7–26.3)
KAPPA/LAMBDA FLC RATIO: 22.99 — AB (ref 0.26–1.65)

## 2016-11-09 LAB — SEDIMENTATION RATE: SED RATE: 17 mm/h (ref 0–40)

## 2016-11-09 LAB — BETA 2 MICROGLOBULIN, SERUM: Beta-2: 2.5 mg/L — ABNORMAL HIGH (ref 0.6–2.4)

## 2016-11-13 LAB — MULTIPLE MYELOMA PANEL, SERUM
ALBUMIN/GLOB SERPL: 1.3 (ref 0.7–1.7)
Albumin SerPl Elph-Mcnc: 3.8 g/dL (ref 2.9–4.4)
Alpha 1: 0.2 g/dL (ref 0.0–0.4)
Alpha2 Glob SerPl Elph-Mcnc: 0.6 g/dL (ref 0.4–1.0)
B-GLOBULIN SERPL ELPH-MCNC: 1 g/dL (ref 0.7–1.3)
Gamma Glob SerPl Elph-Mcnc: 1 g/dL (ref 0.4–1.8)
Globulin, Total: 3 g/dL (ref 2.2–3.9)
IgA, Qn, Serum: 76 mg/dL — ABNORMAL LOW (ref 87–352)
IgG, Qn, Serum: 953 mg/dL (ref 700–1600)
IgM, Qn, Serum: 46 mg/dL (ref 26–217)
M PROTEIN SERPL ELPH-MCNC: 0.3 g/dL — AB
TOTAL PROTEIN: 6.8 g/dL (ref 6.0–8.5)

## 2016-11-13 LAB — UPEP/TP, 24-HR URINE
ALBUMIN, U: 8.7 %
ALPHA-1-GLOBULIN, U: 0.8 %
Alpha-2-Globulin, U: 9.9 %
BETA GLOBULIN, U: 22.7 %
GAMMA GLOBULIN, U: 57.9 %
M-Spike, mg/24 hr: 47.1 mg/24 hr — ABNORMAL HIGH
M-spike, %: 41.2 % — ABNORMAL HIGH
PROTEIN 24H UR: 114 mg/(24.h) (ref 30–150)
PROTEIN,TOTAL,URINE: 17.6 mg/dL

## 2016-11-14 LAB — COPPER, SERUM: Copper: 120 ug/dL (ref 72–166)

## 2016-12-18 ENCOUNTER — Other Ambulatory Visit: Payer: Self-pay | Admitting: Family Medicine

## 2016-12-18 DIAGNOSIS — Z1231 Encounter for screening mammogram for malignant neoplasm of breast: Secondary | ICD-10-CM

## 2016-12-21 ENCOUNTER — Ambulatory Visit
Admission: RE | Admit: 2016-12-21 | Discharge: 2016-12-21 | Disposition: A | Discharge status: Home / Self Care | Source: Ambulatory Visit | Attending: Family Medicine | Admitting: Family Medicine

## 2016-12-21 DIAGNOSIS — Z1231 Encounter for screening mammogram for malignant neoplasm of breast: Secondary | ICD-10-CM

## 2017-01-06 ENCOUNTER — Other Ambulatory Visit: Payer: Self-pay | Admitting: Hematology

## 2017-01-06 DIAGNOSIS — D4989 Neoplasm of unspecified behavior of other specified sites: Secondary | ICD-10-CM

## 2017-01-07 ENCOUNTER — Other Ambulatory Visit: Payer: Self-pay | Admitting: *Deleted

## 2017-01-07 ENCOUNTER — Telehealth: Payer: Self-pay | Admitting: *Deleted

## 2017-01-07 ENCOUNTER — Telehealth: Payer: Self-pay | Admitting: Hematology

## 2017-01-07 DIAGNOSIS — C9000 Multiple myeloma not having achieved remission: Secondary | ICD-10-CM

## 2017-01-07 NOTE — Telephone Encounter (Signed)
Scheduled appt per schedule message from [facility]. Patient is aware of new appt date and time.

## 2017-01-07 NOTE — Telephone Encounter (Signed)
Per MD staff message, pt contacted to inform of lab results.  Pt agreed to biopsy, already scheduled for 4/5.  Informed pt to return to Dr. Irene Limbo about 1 week following biopsy to review results.  Pt verbalized understanding/thankful for call.

## 2017-01-16 ENCOUNTER — Other Ambulatory Visit: Payer: Self-pay | Admitting: Student

## 2017-01-17 ENCOUNTER — Encounter (HOSPITAL_COMMUNITY): Payer: Self-pay

## 2017-01-17 ENCOUNTER — Ambulatory Visit (HOSPITAL_COMMUNITY)
Admission: RE | Admit: 2017-01-17 | Discharge: 2017-01-17 | Disposition: A | Discharge status: Home / Self Care | Source: Ambulatory Visit | Attending: Hematology | Admitting: Hematology

## 2017-01-17 DIAGNOSIS — D61818 Other pancytopenia: Secondary | ICD-10-CM | POA: Insufficient documentation

## 2017-01-17 DIAGNOSIS — C9000 Multiple myeloma not having achieved remission: Secondary | ICD-10-CM | POA: Insufficient documentation

## 2017-01-17 DIAGNOSIS — D4989 Neoplasm of unspecified behavior of other specified sites: Secondary | ICD-10-CM

## 2017-01-17 DIAGNOSIS — D72822 Plasmacytosis: Secondary | ICD-10-CM | POA: Insufficient documentation

## 2017-01-17 DIAGNOSIS — Z87891 Personal history of nicotine dependence: Secondary | ICD-10-CM | POA: Insufficient documentation

## 2017-01-17 LAB — PROTIME-INR
INR: 0.88
Prothrombin Time: 11.9 seconds (ref 11.4–15.2)

## 2017-01-17 LAB — CBC
HEMATOCRIT: 33.8 % — AB (ref 36.0–46.0)
HEMOGLOBIN: 11.2 g/dL — AB (ref 12.0–15.0)
MCH: 31.1 pg (ref 26.0–34.0)
MCHC: 33.1 g/dL (ref 30.0–36.0)
MCV: 93.9 fL (ref 78.0–100.0)
Platelets: 147 10*3/uL — ABNORMAL LOW (ref 150–400)
RBC: 3.6 MIL/uL — AB (ref 3.87–5.11)
RDW: 14.3 % (ref 11.5–15.5)
WBC: 2.8 10*3/uL — AB (ref 4.0–10.5)

## 2017-01-17 LAB — APTT: aPTT: 32 seconds (ref 24–36)

## 2017-01-17 MED ORDER — SODIUM CHLORIDE 0.9 % IV SOLN
INTRAVENOUS | Status: DC
  Administered 2017-01-17: 09:00:00 via INTRAVENOUS

## 2017-01-17 MED ORDER — MIDAZOLAM HCL 2 MG/2ML IJ SOLN
INTRAMUSCULAR | Status: AC
  Filled 2017-01-17: qty 4

## 2017-01-17 MED ORDER — FENTANYL CITRATE (PF) 100 MCG/2ML IJ SOLN
INTRAMUSCULAR | Status: AC
  Filled 2017-01-17: qty 2

## 2017-01-17 MED ORDER — ACETAMINOPHEN 500 MG PO TABS
500.0000 mg | ORAL_TABLET | ORAL | Status: DC | PRN
  Filled 2017-01-17: qty 1

## 2017-01-17 MED ORDER — MIDAZOLAM HCL 2 MG/2ML IJ SOLN
INTRAMUSCULAR | Status: AC | PRN
  Administered 2017-01-17 (×2): 1 mg via INTRAVENOUS

## 2017-01-17 MED ORDER — FENTANYL CITRATE (PF) 100 MCG/2ML IJ SOLN
INTRAMUSCULAR | Status: AC | PRN
  Administered 2017-01-17 (×2): 50 ug via INTRAVENOUS

## 2017-01-17 NOTE — Procedures (Signed)
CT guided bone marrow biopsy.  2 aspirations and 1 core.  Minimal blood loss and no immediate complication.

## 2017-01-17 NOTE — Discharge Instructions (Signed)
Moderate Conscious Sedation, Adult, Care After These instructions provide you with information about caring for yourself after your procedure. Your health care provider may also give you more specific instructions. Your treatment has been planned according to current medical practices, but problems sometimes occur. Call your health care provider if you have any problems or questions after your procedure. What can I expect after the procedure? After your procedure, it is common:  To feel sleepy for several hours.  To feel clumsy and have poor balance for several hours.  To have poor judgment for several hours.  To vomit if you eat too soon. Follow these instructions at home: For at least 24 hours after the procedure:    Do not:  Participate in activities where you could fall or become injured.  Drive.  Use heavy machinery.  Drink alcohol.  Take sleeping pills or medicines that cause drowsiness.  Make important decisions or sign legal documents.  Take care of children on your own.  Rest. Eating and drinking   Follow the diet recommended by your health care provider.  If you vomit:  Drink water, juice, or soup when you can drink without vomiting.  Make sure you have little or no nausea before eating solid foods. General instructions   Have a responsible adult stay with you until you are awake and alert.  Take over-the-counter and prescription medicines only as told by your health care provider.  If you smoke, do not smoke without supervision.  Keep all follow-up visits as told by your health care provider. This is important. Contact a health care provider if:  You keep feeling nauseous or you keep vomiting.  You feel light-headed.  You develop a rash.  You have a fever. Get help right away if:  You have trouble breathing. This information is not intended to replace advice given to you by your health care provider. Make sure you discuss any questions you  have with your health care provider. Document Released: 07/22/2013 Document Revised: 03/05/2016 Document Reviewed: 01/21/2016 Elsevier Interactive Patient Education  2017 [place]. Bone Marrow Aspiration and Bone Marrow Biopsy, Adult, Care After This sheet gives you information about how to care for yourself after your procedure. Your health care provider may also give you more specific instructions. If you have problems or questions, contact your health care provider. What can I expect after the procedure? After the procedure, it is common to have:  Mild pain and tenderness.  Swelling.  Bruising. Follow these instructions at home:  Take over-the-counter or prescription medicines only as told by your health care provider.  Do not take baths, swim, or use a hot tub until your health care provider approves. Ask if you can take a shower or have a sponge bath.  Follow instructions from your health care provider about how to take care of the puncture site. Make sure you:  Wash your hands with soap and water before you change your bandage (dressing). If soap and water are not available, use hand sanitizer.  Change your dressing as told by your health care provider.  Check your puncture siteevery day for signs of infection. Check for:  More redness, swelling, or pain.  More fluid or blood.  Warmth.  Pus or a bad smell.  Return to your normal activities as told by your health care provider. Ask your health care provider what activities are safe for you.  Do not drive for 24 hours if you were given a medicine to help you relax (  sedative). °· Keep all follow-up visits as told by your health care provider. This is important. °Contact a health care provider if: °· You have more redness, swelling, or pain around the puncture site. °· You have more fluid or blood coming from the puncture site. °· Your puncture site feels warm to the touch. °· You have pus or a bad smell coming from the  puncture site. °· You have a fever. °· Your pain is not controlled with medicine. °This information is not intended to replace advice given to you by your health care provider. Make sure you discuss any questions you have with your health care provider. °Document Released: 04/20/2005 Document Revised: 04/20/2016 Document Reviewed: 03/14/2016 °Elsevier Interactive Patient Education © 2017 Elsevier Inc. ° °

## 2017-01-17 NOTE — H&P (Signed)
    Chief Complaint: multiple myeloma  Referring Physician:Dr. Sullivan Lone   Supervising Physician: Markus Daft  Patient Status: [facility] - Out-pt  HPI: Madison Parker is an 66 y.o. 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  She did not require treatment there apparently.  She moved to [facility] in 2015 and has been continued to be monitored.  She is now seeing Dr. Irene Parker.  She had a recent bone survey and blood work.  A request has been made for a bone marrow biopsy.  Therefore, the patient presents today for this procedure.  She denies any complaints such as CP, SOB, fevers, or chills.  Past Medical History:  Past Medical History:  Diagnosis Date  . Headache   . Low back pain   . SBO (small bowel obstruction)   . Smoldering multiple myeloma ([place])     Past Surgical History:  Past Surgical History:  Procedure Laterality Date  . ABDOMINAL HYSTERECTOMY    . ABDOMINAL SURGERY    . BACK SURGERY    . GASTRIC BYPASS    . HERNIA REPAIR    . KNEE SURGERY     both    Family History: History reviewed. No pertinent family history.  Social History:  reports that she has quit smoking. She has never used smokeless tobacco. She reports that she drinks alcohol. She reports that she does not use drugs.  Allergies:  Allergies  Allergen Reactions  . Codeine Anxiety    Medications: Medications reviewed in epic  Please HPI for pertinent positives, otherwise complete 10 system ROS negative.  Mallampati Score: MD Evaluation Airway: WNL Heart: WNL Abdomen: WNL Chest/ Lungs: WNL ASA  Classification: 2 Mallampati/Airway Score: One  Physical Exam: BP 136/72 (BP Location: Right Wrist)   Pulse 71   Temp 97.7 F (36.5 C) (Oral)   Resp 18   Ht '5\' 1"'$  (1.549 m)   Wt 209 lb 3.2 oz (94.9 kg)   SpO2 100%   BMI 39.53 kg/m  Body mass index is 39.53 kg/m. General: pleasant, WD, WN black female who is laying in bed in NAD HEENT: head is normocephalic, atraumatic.  Sclera are  noninjected.  PERRL.  Ears and nose without any masses or lesions.  Mouth is pink and moist Heart: regular, rate, and rhythm.  Normal s1,s2. No obvious murmurs, gallops, or rubs noted.  Palpable radial and pedal pulses bilaterally Lungs: CTAB, no wheezes, rhonchi, or rales noted.  Respiratory effort nonlabored Abd: soft, NT, ND, +BS, no masses, hernias, or organomegaly Psych: A&Ox3 with an appropriate affect.   Labs: Pending  Imaging: No results found.  Assessment/Plan 1. Smoldering multiple myeloma We will plan to proceed today with a bone marrow biopsy.  Her labs are currently pending, but her vitals have been reviewed. Risks and Benefits discussed with the patient including, but not limited to bleeding, infection, damage to adjacent structures or low yield requiring additional tests. All of the patient's questions were answered, patient is agreeable to proceed. Consent signed and in chart.   Thank you for this interesting consult.  I greatly enjoyed meeting Madison Parker and look forward to participating in their care.  A copy of this report was sent to the requesting provider on this date.  Electronically Signed: Henreitta Cea 01/17/2017, 9:34 AM   I spent a total of  30 Minutes   in face to face in clinical consultation, greater than 50% of which was counseling/coordinating care for smoldering multiple myeloma

## 2017-01-23 ENCOUNTER — Telehealth: Payer: Self-pay | Admitting: Hematology

## 2017-01-23 NOTE — Telephone Encounter (Signed)
[place] covering AP 5/25 - moved lab/fu to 5/24, however new order recently sent for lab/fu 4/17. Appointments for 5/24 will be addressed at next visit 4/17.

## 2017-01-29 ENCOUNTER — Encounter: Payer: Self-pay | Admitting: Hematology

## 2017-01-29 ENCOUNTER — Ambulatory Visit (HOSPITAL_BASED_OUTPATIENT_CLINIC_OR_DEPARTMENT_OTHER): Admitting: Hematology

## 2017-01-29 ENCOUNTER — Telehealth: Payer: Self-pay | Admitting: Hematology

## 2017-01-29 ENCOUNTER — Other Ambulatory Visit (HOSPITAL_BASED_OUTPATIENT_CLINIC_OR_DEPARTMENT_OTHER)

## 2017-01-29 VITALS — BP 134/65 | HR 65 | Temp 97.9°F | Resp 18 | Ht 61.0 in | Wt 209.1 lb

## 2017-01-29 DIAGNOSIS — D72818 Other decreased white blood cell count: Secondary | ICD-10-CM | POA: Diagnosis not present

## 2017-01-29 DIAGNOSIS — D472 Monoclonal gammopathy: Secondary | ICD-10-CM

## 2017-01-29 DIAGNOSIS — C9000 Multiple myeloma not having achieved remission: Secondary | ICD-10-CM

## 2017-01-29 DIAGNOSIS — M899 Disorder of bone, unspecified: Secondary | ICD-10-CM

## 2017-01-29 LAB — CBC WITH DIFFERENTIAL/PLATELET
BASO%: 0.7 % (ref 0.0–2.0)
Basophils Absolute: 0 10*3/uL (ref 0.0–0.1)
EOS%: 1.9 % (ref 0.0–7.0)
Eosinophils Absolute: 0.1 10*3/uL (ref 0.0–0.5)
HCT: 34.9 % (ref 34.8–46.6)
HGB: 11.3 g/dL — ABNORMAL LOW (ref 11.6–15.9)
LYMPH#: 1.2 10*3/uL (ref 0.9–3.3)
LYMPH%: 38.8 % (ref 14.0–49.7)
MCH: 31.2 pg (ref 25.1–34.0)
MCHC: 32.4 g/dL (ref 31.5–36.0)
MCV: 96.3 fL (ref 79.5–101.0)
MONO#: 0.2 10*3/uL (ref 0.1–0.9)
MONO%: 5.9 % (ref 0.0–14.0)
NEUT%: 52.7 % (ref 38.4–76.8)
NEUTROS ABS: 1.7 10*3/uL (ref 1.5–6.5)
PLATELETS: 160 10*3/uL (ref 145–400)
RBC: 3.62 10*6/uL — ABNORMAL LOW (ref 3.70–5.45)
RDW: 15.2 % — AB (ref 11.2–14.5)
WBC: 3.2 10*3/uL — ABNORMAL LOW (ref 3.9–10.3)

## 2017-01-29 LAB — COMPREHENSIVE METABOLIC PANEL
ALBUMIN: 4.3 g/dL (ref 3.5–5.0)
ALK PHOS: 119 U/L (ref 40–150)
ALT: 23 U/L (ref 0–55)
ANION GAP: 13 meq/L — AB (ref 3–11)
AST: 21 U/L (ref 5–34)
BILIRUBIN TOTAL: 0.67 mg/dL (ref 0.20–1.20)
BUN: 14.5 mg/dL (ref 7.0–26.0)
CALCIUM: 10.2 mg/dL (ref 8.4–10.4)
CO2: 22 meq/L (ref 22–29)
Chloride: 106 mEq/L (ref 98–109)
Creatinine: 0.8 mg/dL (ref 0.6–1.1)
EGFR: 78 mL/min/{1.73_m2} — ABNORMAL LOW (ref >90)
Glucose: 83 mg/dl (ref 70–140)
Potassium: 3.6 mEq/L (ref 3.5–5.1)
Sodium: 141 mEq/L (ref 136–145)
TOTAL PROTEIN: 7.6 g/dL (ref 6.4–8.3)

## 2017-01-29 LAB — CHROMOSOME ANALYSIS, BONE MARROW

## 2017-01-29 LAB — TISSUE HYBRIDIZATION (BONE MARROW)-NCBH

## 2017-01-29 NOTE — Telephone Encounter (Signed)
Appointments scheduled per 4.17.18 LOS. Patient given AVS report and calendars with future scheduled appointments. °

## 2017-01-30 LAB — KAPPA/LAMBDA LIGHT CHAINS
Ig Kappa Free Light Chain: 350.9 mg/L — ABNORMAL HIGH (ref 3.3–19.4)
Ig Lambda Free Light Chain: 18.5 mg/L (ref 5.7–26.3)
KAPPA/LAMBDA FLC RATIO: 18.97 — AB (ref 0.26–1.65)

## 2017-02-04 LAB — MULTIPLE MYELOMA PANEL, SERUM
ALBUMIN SERPL ELPH-MCNC: 4.2 g/dL (ref 2.9–4.4)
ALPHA 1: 0.2 g/dL (ref 0.0–0.4)
ALPHA2 GLOB SERPL ELPH-MCNC: 0.6 g/dL (ref 0.4–1.0)
Albumin/Glob SerPl: 1.6 (ref 0.7–1.7)
B-GLOBULIN SERPL ELPH-MCNC: 1 g/dL (ref 0.7–1.3)
Gamma Glob SerPl Elph-Mcnc: 0.9 g/dL (ref 0.4–1.8)
Globulin, Total: 2.7 g/dL (ref 2.2–3.9)
IGG (IMMUNOGLOBIN G), SERUM: 943 mg/dL (ref 700–1600)
IGM (IMMUNOGLOBIN M), SRM: 50 mg/dL (ref 26–217)
IgA, Qn, Serum: 91 mg/dL (ref 87–352)
M PROTEIN SERPL ELPH-MCNC: 0.3 g/dL — AB
TOTAL PROTEIN: 6.9 g/dL (ref 6.0–8.5)

## 2017-02-04 NOTE — Progress Notes (Signed)
[facility]    HEMATOLOGY/ONCOLOGY CLINIC NOTE  Date of Service: 02/04/2017  PCP: Marzetta Board MD  CHIEF COMPLAINTS/PURPOSE OF CONSULTATION:  Smoldering multiple myeloma  HISTORY OF PRESENTING ILLNESS:   Madison Parker is a wonderful 66 y.o. 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  Patient has a history of smoldering multiple myeloma which she reports she was diagnosed with in 2011 when she was evaluated by a hematologist in [place]. She reports that she presented with low blood counts (low WBC)   and after significant workup she had a bone marrow examination based on which she was told that she has smoldering multiple myeloma. Patient notes that she had a bone survey at that time which was negative. She was monitored there closely and then moved to [facility] in 2015 to stay with her son whose wife was being treated for breast cancer. Patient was following with Dr. Delight Hoh in [place]. She reports not having had any treatment for multiple myeloma.  Her last labs from about a year ago showed a SPEP with no OBSERVED M spike . Serum free light chains showed an elevation of kappa Free light chain 310 and lambda free light chain of 12.7 with an abnormal Kappa/ Lambda ratio of 24.38 ( up from 19 previously). Random urine showed an M protein complement of 44%.  She lives in [place] and requested transfer of care to [place]. She was offered follow-up but [facility] in [place]  But prefers to  follow [place] in [place]. Patient reports her energy levels been stable. She has chronic back pain which has improved since the patient had her spinal surgery in April 2017 (L4-L5 interbody fusion). Patient notes she has had some chronic left hip pain which is somewhat more bothersome with the navy on the lateral aspect of her upper thighs that's painful. She also notes some right hip pain. Over the  last few weeks he notes some upper neck pain especially when bending her neck backwards and tingling numbness in her right upper extremity.  Has been working on losing some weight voluntarily.  No acute other new symptoms.  INTERVAL HISTORY  Patient is here for f/u for her myeloma workup and BM examination. Bone survey showed possible small lytic lesions in the rt and left scapula. BM Bx results were discussed in details.  She notes no acute new symptoms.  MEDICAL HISTORY:  Past Medical History:  Diagnosis Date  . Headache   . Low back pain   . SBO (small bowel obstruction) ([place])   . Smoldering multiple myeloma ([facility])   Previous history of hypothyroidism- not currently medications Anxiety Obesity .Body mass index is 39.51 kg/m. Vitamin D deficiency Smoldering multiple myeloma diagnosed in 2011 Lumbosacral radiculopathy Left hip pain  SURGICAL HISTORY: Past Surgical History:  Procedure Laterality Date  . ABDOMINAL HYSTERECTOMY    . ABDOMINAL SURGERY    . BACK SURGERY    . GASTRIC BYPASS    . HERNIA REPAIR    . KNEE SURGERY     both  Spinal surgery April 2017  SOCIAL HISTORY: Social History   Social History  . Marital status: Married    Spouse name: N/A  . Number of children: N/A  . Years of education: N/A   Occupational History  . Not on file.   Social History Main Topics  . Smoking status: Former Research scientist (life sciences)  . Smokeless tobacco: Never Used  .  Alcohol use Yes     Comment: occ  . Drug use: No  . Sexual activity: Not on file   Other Topics Concern  . Not on file   Social History Narrative  . No narrative on file  Occasional alcohol use Former smoker and smoked 1 pack per week for about 9 years has since quit.  FAMILY HISTORY: History reviewed. No pertinent family history.  ALLERGIES:  is allergic to codeine.  MEDICATIONS:  Current Outpatient Prescriptions  Medication Sig Dispense Refill  . acetaminophen (TYLENOL) 650 MG CR tablet Take 650 mg by mouth  as needed.     . Calcium Carbonate-Vitamin D (CALCIUM 600+D) 600-200 MG-UNIT TABS Take by mouth.    . Cholecalciferol (VITAMIN D3) 2000 units TABS Take by mouth.    . gabapentin (NEURONTIN) 300 MG capsule     . vitamin B-12 (CYANOCOBALAMIN) 100 MCG tablet Take 100 mcg by mouth daily.     No current facility-administered medications for this visit.     REVIEW OF SYSTEMS:    10 Point review of Systems was done is negative except as noted above.  PHYSICAL EXAMINATION: ECOG PERFORMANCE STATUS: 1 - Symptomatic but completely ambulatory  . Vitals:   01/29/17 1348  BP: 134/65  Pulse: 65  Resp: 18  Temp: 97.9 F (36.6 C)   Filed Weights   01/29/17 1348  Weight: 209 lb 1.6 oz (94.8 kg)   .Body mass index is 39.51 kg/m.  GENERAL:alert, in no acute distress and comfortable SKIN: skin color, texture, turgor are normal, no rashes or significant lesions EYES: normal, conjunctiva are pink and non-injected, sclera clear OROPHARYNX:no exudate, no erythema and lips, buccal mucosa, and tongue normal  NECK: supple, no JVD, thyroid normal size, non-tender, without nodularity LYMPH:  no palpable lymphadenopathy in the cervical, axillary or inguinal LUNGS: clear to auscultation with normal respiratory effort HEART: regular rate & rhythm,  no murmurs and no lower extremity edema ABDOMEN: abdomen soft, non-tender, normoactive bowel sounds  Musculoskeletal: no cyanosis of digits and no clubbing  PSYCH: alert & oriented x 3 with fluent speech NEURO: no focal motor/sensory deficits  LABORATORY DATA:  I have reviewed the data as listed  . CBC Latest Ref Rng & Units 01/29/2017 01/17/2017 11/08/2016  WBC 3.9 - 10.3 10e3/uL 3.2(L) 2.8(L) 2.7(L)  Hemoglobin 11.6 - 15.9 g/dL 11.3(L) 11.2(L) 10.8(L)  Hematocrit 34.8 - 46.6 % 34.9 33.8(L) 32.8(L)  Platelets 145 - 400 10e3/uL 160 147(L) 100(L)    . CMP Latest Ref Rng & Units 01/29/2017 01/29/2017 11/08/2016  Glucose 70 - 140 mg/dl 83 - 81  BUN 7.0 -  26.0 mg/dL 14.5 - 9.2  Creatinine 0.6 - 1.1 mg/dL 0.8 - 0.9  Sodium 136 - 145 mEq/L 141 - 139  Potassium 3.5 - 5.1 mEq/L 3.6 - 4.2  Chloride 101 - 111 mmol/L - - -  CO2 22 - 29 mEq/L 22 - 22  Calcium 8.4 - 10.4 mg/dL 10.2 - 9.7  Total Protein 6.0 - 8.5 g/dL 7.6 6.9 7.4  Total Bilirubin 0.20 - 1.20 mg/dL 0.67 - 0.83  Alkaline Phos 40 - 150 U/L 119 - 117  AST 5 - 34 U/L 21 - 25  ALT 0 - 55 U/L 23 - 21   Component     Latest Ref Rng & Units 11/08/2016  LDH     125 - 245 U/L 220  Beta 2     0.6 - 2.4 mg/L 2.5 (H)  RADIOGRAPHIC STUDIES: I have personally reviewed the radiological images as listed and agreed with the findings in the report. Ct Biopsy  Result Date: 01/17/2017 INDICATION: 66 year old with smoldering multiple myeloma. EXAM: CT GUIDED BONE MARROW ASPIRATES AND BIOPSY Physician: Stephan Minister. Anselm Pancoast, MD MEDICATIONS: None. ANESTHESIA/SEDATION: Fentanyl 2.0 mcg IV; Versed 100 mg IV Moderate Sedation Time:  21 minutes The patient was continuously monitored during the procedure by the interventional radiology nurse under my direct supervision. COMPLICATIONS: None immediate. PROCEDURE: The procedure was explained to the patient. The risks and benefits of the procedure were discussed and the patient's questions were addressed. Informed consent was obtained from the patient. The patient was placed prone on CT scan. Images of the pelvis were obtained. The right side of back was prepped and draped in sterile fashion. The skin and right posterior iliac bone were anesthetized with 1% lidocaine. 11 gauge bone needle was directed into the right iliac bone with CT guidance. Two aspirates and two core biopsies obtained. Bandage placed over the puncture site. FINDINGS: Bone needle directed into the posterior right ilium. IMPRESSION: CT guided bone marrow aspirates and core biopsy. Electronically Signed   By: Markus Daft M.D.   On: 01/17/2017 17:37   Ct Bone Marrow Biopsy & Aspiration  Result  Date: 01/17/2017 INDICATION: 66 year old with smoldering multiple myeloma. EXAM: CT GUIDED BONE MARROW ASPIRATES AND BIOPSY Physician: Stephan Minister. Anselm Pancoast, MD MEDICATIONS: None. ANESTHESIA/SEDATION: Fentanyl 2.0 mcg IV; Versed 100 mg IV Moderate Sedation Time:  21 minutes The patient was continuously monitored during the procedure by the interventional radiology nurse under my direct supervision. COMPLICATIONS: None immediate. PROCEDURE: The procedure was explained to the patient. The risks and benefits of the procedure were discussed and the patient's questions were addressed. Informed consent was obtained from the patient. The patient was placed prone on CT scan. Images of the pelvis were obtained. The right side of back was prepped and draped in sterile fashion. The skin and right posterior iliac bone were anesthetized with 1% lidocaine. 11 gauge bone needle was directed into the right iliac bone with CT guidance. Two aspirates and two core biopsies obtained. Bandage placed over the puncture site. FINDINGS: Bone needle directed into the posterior right ilium. IMPRESSION: CT guided bone marrow aspirates and core biopsy. Electronically Signed   By: Markus Daft M.D.   On: 01/17/2017 17:37   Bone survey 11/08/2016 IMPRESSION: 1. Small lytic lesion in the right scapula. 2. Possible small lytic lesion in the left scapula. 3. Postoperative changes. 4. Significant degenerative changes in both hips, left greater than right. 5. No evidence for acute fracture   Electronically Signed   By: Nolon Nations M.D.   On: 11/08/2016 16:19   ASSESSMENT & PLAN:   66 year old very pleasant lady with history of   1) Smoldering Multiple Myeloma vs Multiple myeloma (concern for small lytic lesions in left and right scapulae) - (appears light chain producing) This was apparently diagnosed in 2011 by her hematologist in [place]. Last couple of SPEP's showed no M spike but she had Increased serum kappa light  chains with a K/L ratio of 24. Improved a little today. UPEP with M protein. No anemia/renal failure/hypercalcemia. Bone survey with concern fr possible small lytic lesions in B/L scapulae Bm Bx with 17%kappa restriction plasma cells consistent with plasma cell neoplasm. No treatment so far.  2) Chronic leukopenia - ? Related to SMM vs other nutritional deficiencies (h/o gastric bypass surgery put her at risk for nutritional deficiencies) PLAN -  lab and BM Bx , skeletal survey results discussed in details with the patient and her spouse. -skeletal lesions are indeterminate and would need to be evaluated further with a PET/CT since if bone lesions are present that was make this MM and would require consideration of immediate treatment. -PET/CT in 1 week RTC with Dr Irene Limbo in 3 months with labs. Earlier based on PET/CT results.  All of the patients questions were answered with apparent satisfaction. The patient knows to call the clinic with any problems, questions or concerns.  I spent 25 minutes counseling the patient face to face. The total time spent in the appointment was 30 minutes and more than 50% was on counseling and direct patient cares.    Sullivan Lone MD [place] AAHIVMS [facility] Hematology/Oncology Physician [facility]  (Office):       [phone] (Work cell):  [phone] (Fax):           [phone]

## 2017-02-08 ENCOUNTER — Encounter (HOSPITAL_COMMUNITY): Payer: Self-pay

## 2017-03-04 ENCOUNTER — Encounter (HOSPITAL_COMMUNITY)

## 2017-03-07 ENCOUNTER — Encounter: Payer: Self-pay | Admitting: Hematology

## 2017-03-07 ENCOUNTER — Telehealth: Payer: Self-pay | Admitting: Hematology

## 2017-03-07 ENCOUNTER — Other Ambulatory Visit (HOSPITAL_BASED_OUTPATIENT_CLINIC_OR_DEPARTMENT_OTHER)

## 2017-03-07 ENCOUNTER — Ambulatory Visit (HOSPITAL_BASED_OUTPATIENT_CLINIC_OR_DEPARTMENT_OTHER): Admitting: Hematology

## 2017-03-07 VITALS — BP 112/81 | HR 75 | Temp 97.9°F | Resp 18 | Ht 61.0 in | Wt 203.5 lb

## 2017-03-07 DIAGNOSIS — E039 Hypothyroidism, unspecified: Secondary | ICD-10-CM

## 2017-03-07 DIAGNOSIS — D649 Anemia, unspecified: Secondary | ICD-10-CM

## 2017-03-07 DIAGNOSIS — D472 Monoclonal gammopathy: Secondary | ICD-10-CM

## 2017-03-07 DIAGNOSIS — G629 Polyneuropathy, unspecified: Secondary | ICD-10-CM

## 2017-03-07 DIAGNOSIS — C9000 Multiple myeloma not having achieved remission: Secondary | ICD-10-CM | POA: Diagnosis not present

## 2017-03-07 DIAGNOSIS — R5383 Other fatigue: Secondary | ICD-10-CM

## 2017-03-07 DIAGNOSIS — M899 Disorder of bone, unspecified: Secondary | ICD-10-CM

## 2017-03-07 LAB — COMPREHENSIVE METABOLIC PANEL
ALBUMIN: 4.1 g/dL (ref 3.5–5.0)
ALK PHOS: 107 U/L (ref 40–150)
ALT: 26 U/L (ref 0–55)
AST: 22 U/L (ref 5–34)
Anion Gap: 7 mEq/L (ref 3–11)
BUN: 21.1 mg/dL (ref 7.0–26.0)
CHLORIDE: 109 meq/L (ref 98–109)
CO2: 24 mEq/L (ref 22–29)
Calcium: 9.8 mg/dL (ref 8.4–10.4)
Creatinine: 0.8 mg/dL (ref 0.6–1.1)
EGFR: 75 mL/min/{1.73_m2} — AB (ref >90)
GLUCOSE: 94 mg/dL (ref 70–140)
POTASSIUM: 3.8 meq/L (ref 3.5–5.1)
SODIUM: 140 meq/L (ref 136–145)
Total Bilirubin: 0.71 mg/dL (ref 0.20–1.20)
Total Protein: 7.1 g/dL (ref 6.4–8.3)

## 2017-03-07 LAB — CBC & DIFF AND RETIC
BASO%: 0.3 % (ref 0.0–2.0)
Basophils Absolute: 0 10*3/uL (ref 0.0–0.1)
EOS%: 1.2 % (ref 0.0–7.0)
Eosinophils Absolute: 0 10*3/uL (ref 0.0–0.5)
HCT: 32.6 % — ABNORMAL LOW (ref 34.8–46.6)
HEMOGLOBIN: 10.5 g/dL — AB (ref 11.6–15.9)
Immature Retic Fract: 10.4 % — ABNORMAL HIGH (ref 1.60–10.00)
LYMPH%: 39.8 % (ref 14.0–49.7)
MCH: 31.1 pg (ref 25.1–34.0)
MCHC: 32.2 g/dL (ref 31.5–36.0)
MCV: 96.4 fL (ref 79.5–101.0)
MONO#: 0.2 10*3/uL (ref 0.1–0.9)
MONO%: 7.1 % (ref 0.0–14.0)
NEUT%: 51.6 % (ref 38.4–76.8)
NEUTROS ABS: 1.7 10*3/uL (ref 1.5–6.5)
Platelets: 142 10*3/uL — ABNORMAL LOW (ref 145–400)
RBC: 3.38 10*6/uL — ABNORMAL LOW (ref 3.70–5.45)
RDW: 14.2 % (ref 11.2–14.5)
Retic %: 1.81 % (ref 0.70–2.10)
Retic Ct Abs: 61.18 10*3/uL (ref 33.70–90.70)
WBC: 3.2 10*3/uL — AB (ref 3.9–10.3)
lymph#: 1.3 10*3/uL (ref 0.9–3.3)

## 2017-03-07 MED ORDER — GABAPENTIN 300 MG PO CAPS: 300.0000 mg | ORAL_CAPSULE | Freq: Every day | ORAL | 1 refills | Status: DC

## 2017-03-07 MED ORDER — TRAMADOL HCL 50 MG PO TABS: 50.0000 mg | ORAL_TABLET | Freq: Four times a day (QID) | ORAL | 0 refills | Status: DC | PRN

## 2017-03-07 NOTE — Progress Notes (Signed)
Marland Kitchen    HEMATOLOGY/ONCOLOGY CLINIC NOTE  Date of Service: 03/07/2017  PCP: Marzetta Board MD  CHIEF COMPLAINTS/PURPOSE OF CONSULTATION:  Smoldering multiple myeloma  HISTORY OF PRESENTING ILLNESS:   Madison Parker is a wonderful 66 y.o. 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  Patient has a history of smoldering multiple myeloma which she reports she was diagnosed with in 2011 when she was evaluated by a hematologist in [place]. She reports that she presented with low blood counts (low WBC)   and after significant workup she had a bone marrow examination based on which she was told that she has smoldering multiple myeloma. Patient notes that she had a bone survey at that time which was negative. She was monitored there closely and then moved to [facility] in 2015 to stay with her son whose wife was being treated for breast cancer. Patient was following with Dr. Delight Hoh in [place]. She reports not having had any treatment for multiple myeloma.  Her last labs from about a year ago showed a SPEP with no OBSERVED M spike . Serum free light chains showed an elevation of kappa Free light chain 310 and lambda free light chain of 12.7 with an abnormal Kappa/ Lambda ratio of 24.38 ( up from 19 previously). Random urine showed an M protein complement of 44%.  She lives in [place] and requested transfer of care to [place]. She was offered follow-up but [facility] in [place]  But prefers to  follow [place] in [place]. Patient reports her energy levels been stable. She has chronic back pain which has improved since the patient had her spinal surgery in April 2017 (L4-L5 interbody fusion). Patient notes she has had some chronic left hip pain which is somewhat more bothersome with the navy on the lateral aspect of her upper thighs that's painful. She also notes some right hip pain. Over the  last few weeks he notes some upper neck pain especially when bending her neck backwards and tingling numbness in her right upper extremity.  Has been working on losing some weight voluntarily.  No acute other new symptoms.  INTERVAL HISTORY  Patient is here for f/u for her smoldering multiple myeloma. Labs show no hypercalcemia or renal failure. She notes some increased fatigue which is partly related to uncontrolled left hip pain for which she has been recommended surgery by orthopedics. Some increase in anemia with a hemoglobin down to 10.5. Has not been taking her B12 and B complex regularly and was recommended to do so. Previously Bone survey showed possible small lytic lesions in the rt and left scapula and a PET/CT was ordered which was delayed due to insurance approval issues but has been scheduled for 03/12/2017. Myeloma panel and serum free light chain from today are pending. She notes significant cramping in her left lower extremity related to her radiculopathy. Was previously on gabapentin but this was discontinued by orthopedic doctors and has made her much more uncomfortable. She was given a refill on her gabapentin and given tramadol for left hip pain and asked to follow-up with her primary care physician for continued management. BM Bx results were discussed in details.  She notes no acute new symptoms.   MEDICAL HISTORY:  Past Medical History:  Diagnosis Date  . Headache   . Low back pain   . SBO (small bowel obstruction) ([place])   . Smoldering multiple myeloma ([place])  Previous history of hypothyroidism- not currently medications Anxiety Obesity .Body mass index is 38.45 kg/m. Vitamin D deficiency Smoldering multiple myeloma diagnosed in 2011 Lumbosacral radiculopathy Left hip pain  SURGICAL HISTORY: Past Surgical History:  Procedure Laterality Date  . ABDOMINAL HYSTERECTOMY    . ABDOMINAL SURGERY    . BACK SURGERY    . GASTRIC BYPASS    . HERNIA REPAIR    . KNEE  SURGERY     both  Spinal surgery April 2017  SOCIAL HISTORY: Social History   Social History  . Marital status: Married    Spouse name: N/A  . Number of children: N/A  . Years of education: N/A   Occupational History  . Not on file.   Social History Main Topics  . Smoking status: Former Games developer  . Smokeless tobacco: Never Used  . Alcohol use Yes     Comment: occ  . Drug use: No  . Sexual activity: Not on file   Other Topics Concern  . Not on file   Social History Narrative  . No narrative on file  Occasional alcohol use Former smoker and smoked 1 pack per week for about 9 years has since quit.  FAMILY HISTORY: History reviewed. No pertinent family history.  ALLERGIES:  is allergic to codeine.  MEDICATIONS:  Current Outpatient Prescriptions  Medication Sig Dispense Refill  . acetaminophen (TYLENOL) 650 MG CR tablet Take 650 mg by mouth as needed.     . Calcium Carbonate-Vitamin D (CALCIUM 600+D) 600-200 MG-UNIT TABS Take by mouth.    . Cholecalciferol (VITAMIN D3) 2000 units TABS Take by mouth.    . vitamin B-12 (CYANOCOBALAMIN) 100 MCG tablet Take 100 mcg by mouth daily.     No current facility-administered medications for this visit.     REVIEW OF SYSTEMS:    10 Point review of Systems was done is negative except as noted above.  PHYSICAL EXAMINATION: ECOG PERFORMANCE STATUS: 1 - Symptomatic but completely ambulatory  . Vitals:   03/07/17 1038  BP: 112/81  Pulse: 75  Resp: 18  Temp: 97.9 F (36.6 C)   Filed Weights   03/07/17 1038  Weight: 203 lb 8 oz (92.3 kg)   .Body mass index is 38.45 kg/m.  GENERAL:alert, in no acute distress and comfortable SKIN: skin color, texture, turgor are normal, no rashes or significant lesions EYES: normal, conjunctiva are pink and non-injected, sclera clear OROPHARYNX:no exudate, no erythema and lips, buccal mucosa, and tongue normal  NECK: supple, no JVD, thyroid normal size, non-tender, without  nodularity LYMPH:  no palpable lymphadenopathy in the cervical, axillary or inguinal LUNGS: clear to auscultation with normal respiratory effort HEART: regular rate & rhythm,  no murmurs and no lower extremity edema ABDOMEN: abdomen soft, non-tender, normoactive bowel sounds  Musculoskeletal: no cyanosis of digits and no clubbing  PSYCH: alert & oriented x 3 with fluent speech NEURO: no focal motor/sensory deficits  LABORATORY DATA:  I have reviewed the data as listed  . CBC Latest Ref Rng & Units 03/07/2017 01/29/2017 01/17/2017  WBC 3.9 - 10.3 10e3/uL 3.2(L) 3.2(L) 2.8(L)  Hemoglobin 11.6 - 15.9 g/dL 10.5(L) 11.3(L) 11.2(L)  Hematocrit 34.8 - 46.6 % 32.6(L) 34.9 33.8(L)  Platelets 145 - 400 10e3/uL 142(L) 160 147(L)    . CMP Latest Ref Rng & Units 03/07/2017 01/29/2017 01/29/2017  Glucose 70 - 140 mg/dl 94 83 -  BUN 7.0 - 33.9 mg/dL 47.9 84.8 -  Creatinine 0.6 - 1.1 mg/dL 0.8 0.8 -  Sodium  136 - 145 mEq/L 140 141 -  Potassium 3.5 - 5.1 mEq/L 3.8 3.6 -  Chloride 101 - 111 mmol/L - - -  CO2 22 - 29 mEq/L 24 22 -  Calcium 8.4 - 10.4 mg/dL 9.8 10.2 -  Total Protein 6.4 - 8.3 g/dL 7.1 7.6 6.9  Total Bilirubin 0.20 - 1.20 mg/dL 0.71 0.67 -  Alkaline Phos 40 - 150 U/L 107 119 -  AST 5 - 34 U/L 22 21 -  ALT 0 - 55 U/L 26 23 -   Component     Latest Ref Rng & Units 11/08/2016  LDH     125 - 245 U/L 220  Beta 2     0.6 - 2.4 mg/L 2.5 (H)           RADIOGRAPHIC STUDIES: I have personally reviewed the radiological images as listed and agreed with the findings in the report. No results found. Bone survey 11/08/2016 IMPRESSION: 1. Small lytic lesion in the right scapula. 2. Possible small lytic lesion in the left scapula. 3. Postoperative changes. 4. Significant degenerative changes in both hips, left greater than right. 5. No evidence for acute fracture   Electronically Signed   By: Nolon Nations M.D.   On: 11/08/2016 16:19   ASSESSMENT & PLAN:   66 year old  very pleasant lady with history of   1) Smoldering Multiple Myeloma vs Multiple myeloma (concern for small lytic lesions in left and right scapulae) - (appears light chain producing) This was apparently diagnosed in 2011 by her hematologist in [place]. Last couple of SPEP's showed no M spike but she had Increased serum kappa light chains with a K/L ratio of 19. Improved a little today. UPEP with M protein. Patient has some mild but increasing anemia with a hemoglobin of 10.5 today and some increased fatigue. No renal failure/hypercalcemia. Bone survey with concern fr possible small lytic lesions in B/L scapulae. PET/CT scan pending due to insurance delays now scheduled for 03/12/2017. Bm Bx with 17%kappa restriction plasma cells consistent with plasma cell neoplasm. No treatment so far.  2) Chronic leukopenia - ? Related to SMM vs other nutritional deficiencies (h/o gastric bypass surgery put her at risk for nutritional deficiencies) PLAN -lab results discussed with the patient. -Increasing anemia and fatigue are somewhat concerning. However she has other etiologies for anemia including possible nutritional deficiencies in the setting of her gastric bypass surgery. -She was recommended to take her B complex and B12 regularly. -We will follow up on her myeloma panel and serum free light chains labs from today which are currently pending. -She will follow-up for her PET/CT scan as per plan on 03/12/2017. -If she has obvious lytic lesions on the PET scan then would meet the criteria to start treating for multiple myeloma. -Follow-up and optimize treatment for hypothyroidism with primary care physician.  3) Neuropathy/radiculopathy -Given a refill on her gabapentin 300 mg by mouth at bedtime -Tramadol 50 mg by mouth every 6 hours as needed for her left hip arthritis pain and neuropathy/radiculopathy.  RTC with Dr Irene Limbo in 2 months with labs. Earlier based on PET/CT results.  All of the  patients questions were answered with apparent satisfaction. The patient knows to call the clinic with any problems, questions or concerns.  I spent 25 minutes counseling the patient face to face. The total time spent in the appointment was 30 minutes and more than 50% was on counseling and direct patient cares.    Sullivan Lone MD MS  AAHIVMS [facility] Hematology/Oncology Physician [place]  (Office):       [phone] (Work cell):  [phone] (Fax):           [phone]

## 2017-03-07 NOTE — Telephone Encounter (Signed)
Appointments scheduled per 03/07/17 los. Patient was given a copy of the AVS report and appointment schedule per 03/07/17 los. °

## 2017-03-08 ENCOUNTER — Ambulatory Visit: Admitting: Hematology

## 2017-03-08 ENCOUNTER — Other Ambulatory Visit

## 2017-03-08 LAB — KAPPA/LAMBDA LIGHT CHAINS
IG KAPPA FREE LIGHT CHAIN: 365.7 mg/L — AB (ref 3.3–19.4)
Ig Lambda Free Light Chain: 15.8 mg/L (ref 5.7–26.3)
Kappa/Lambda FluidC Ratio: 23.15 — ABNORMAL HIGH (ref 0.26–1.65)

## 2017-03-11 LAB — MULTIPLE MYELOMA PANEL, SERUM
ALBUMIN SERPL ELPH-MCNC: 3.7 g/dL (ref 2.9–4.4)
Albumin/Glob SerPl: 1.3 (ref 0.7–1.7)
Alpha 1: 0.3 g/dL (ref 0.0–0.4)
Alpha2 Glob SerPl Elph-Mcnc: 0.6 g/dL (ref 0.4–1.0)
B-Globulin SerPl Elph-Mcnc: 1.1 g/dL (ref 0.7–1.3)
GAMMA GLOB SERPL ELPH-MCNC: 1 g/dL (ref 0.4–1.8)
GLOBULIN, TOTAL: 3 g/dL (ref 2.2–3.9)
IGA/IMMUNOGLOBULIN A, SERUM: 76 mg/dL — AB (ref 87–352)
IGM (IMMUNOGLOBIN M), SRM: 47 mg/dL (ref 26–217)
IgG, Qn, Serum: 869 mg/dL (ref 700–1600)
M Protein SerPl Elph-Mcnc: 0.2 g/dL — ABNORMAL HIGH
Total Protein: 6.7 g/dL (ref 6.0–8.5)

## 2017-03-12 ENCOUNTER — Encounter (HOSPITAL_COMMUNITY)
Admission: RE | Admit: 2017-03-12 | Discharge: 2017-03-12 | Disposition: A | Discharge status: Home / Self Care | Source: Ambulatory Visit | Attending: Hematology | Admitting: Hematology

## 2017-03-12 DIAGNOSIS — M899 Disorder of bone, unspecified: Secondary | ICD-10-CM | POA: Insufficient documentation

## 2017-03-12 DIAGNOSIS — C9000 Multiple myeloma not having achieved remission: Secondary | ICD-10-CM | POA: Diagnosis not present

## 2017-03-12 DIAGNOSIS — D472 Monoclonal gammopathy: Secondary | ICD-10-CM

## 2017-03-12 LAB — GLUCOSE, CAPILLARY: Glucose-Capillary: 82 mg/dL (ref 65–99)

## 2017-03-12 MED ORDER — FLUDEOXYGLUCOSE F - 18 (FDG) INJECTION
11.6000 | Freq: Once | INTRAVENOUS | Status: AC | PRN
  Administered 2017-03-12: 11.6 via INTRAVENOUS

## 2017-03-25 ENCOUNTER — Telehealth: Payer: Self-pay | Admitting: *Deleted

## 2017-03-25 NOTE — Telephone Encounter (Signed)
Informed patient of information below. Patient verbalized understanding.

## 2017-03-25 NOTE — Telephone Encounter (Signed)
-----   Message from Brunetta Genera, MD sent at 03/22/2017  1:02 PM EDT ----- Plz let patient know when you get a chance that her PET/CT did not show any concerning bone lesions suggestive of active myeloma at this time. thx GK

## 2017-04-24 ENCOUNTER — Telehealth: Payer: Self-pay

## 2017-04-24 ENCOUNTER — Other Ambulatory Visit: Payer: Self-pay

## 2017-04-24 MED ORDER — GABAPENTIN 300 MG PO CAPS: 300.0000 mg | ORAL_CAPSULE | Freq: Every day | ORAL | 0 refills | Status: DC

## 2017-04-24 NOTE — Telephone Encounter (Signed)
Gabapentin refilled and sent to preferred pharmacy. Pt verbalized understanding.

## 2017-04-30 ENCOUNTER — Telehealth: Payer: Self-pay | Admitting: Hematology

## 2017-04-30 ENCOUNTER — Other Ambulatory Visit (HOSPITAL_BASED_OUTPATIENT_CLINIC_OR_DEPARTMENT_OTHER)

## 2017-04-30 ENCOUNTER — Encounter: Payer: Self-pay | Admitting: Hematology

## 2017-04-30 ENCOUNTER — Ambulatory Visit (HOSPITAL_BASED_OUTPATIENT_CLINIC_OR_DEPARTMENT_OTHER): Admitting: Hematology

## 2017-04-30 VITALS — BP 139/70 | HR 76 | Temp 98.5°F | Resp 16 | Wt 206.1 lb

## 2017-04-30 DIAGNOSIS — D709 Neutropenia, unspecified: Secondary | ICD-10-CM | POA: Diagnosis not present

## 2017-04-30 DIAGNOSIS — D649 Anemia, unspecified: Secondary | ICD-10-CM

## 2017-04-30 DIAGNOSIS — G629 Polyneuropathy, unspecified: Secondary | ICD-10-CM | POA: Diagnosis not present

## 2017-04-30 DIAGNOSIS — C9000 Multiple myeloma not having achieved remission: Secondary | ICD-10-CM

## 2017-04-30 DIAGNOSIS — D472 Monoclonal gammopathy: Secondary | ICD-10-CM

## 2017-04-30 DIAGNOSIS — E538 Deficiency of other specified B group vitamins: Secondary | ICD-10-CM

## 2017-04-30 DIAGNOSIS — M899 Disorder of bone, unspecified: Secondary | ICD-10-CM

## 2017-04-30 DIAGNOSIS — M1612 Unilateral primary osteoarthritis, left hip: Secondary | ICD-10-CM

## 2017-04-30 LAB — COMPREHENSIVE METABOLIC PANEL
ALBUMIN: 4.1 g/dL (ref 3.5–5.0)
ALK PHOS: 101 U/L (ref 40–150)
ALT: 23 U/L (ref 0–55)
ANION GAP: 10 meq/L (ref 3–11)
AST: 25 U/L (ref 5–34)
BUN: 14.6 mg/dL (ref 7.0–26.0)
CALCIUM: 9.9 mg/dL (ref 8.4–10.4)
CO2: 23 mEq/L (ref 22–29)
Chloride: 108 mEq/L (ref 98–109)
Creatinine: 1 mg/dL (ref 0.6–1.1)
EGFR: 63 mL/min/{1.73_m2} — AB (ref >90)
GLUCOSE: 90 mg/dL (ref 70–140)
POTASSIUM: 3.7 meq/L (ref 3.5–5.1)
SODIUM: 142 meq/L (ref 136–145)
TOTAL PROTEIN: 7.2 g/dL (ref 6.4–8.3)
Total Bilirubin: 0.67 mg/dL (ref 0.20–1.20)

## 2017-04-30 LAB — CBC & DIFF AND RETIC
BASO%: 0.3 % (ref 0.0–2.0)
BASOS ABS: 0 10*3/uL (ref 0.0–0.1)
EOS ABS: 0 10*3/uL (ref 0.0–0.5)
EOS%: 0.8 % (ref 0.0–7.0)
HEMATOCRIT: 32.9 % — AB (ref 34.8–46.6)
HEMOGLOBIN: 10.6 g/dL — AB (ref 11.6–15.9)
IMMATURE RETIC FRACT: 15.4 % — AB (ref 1.60–10.00)
LYMPH%: 30.7 % (ref 14.0–49.7)
MCH: 30.5 pg (ref 25.1–34.0)
MCHC: 32.2 g/dL (ref 31.5–36.0)
MCV: 94.5 fL (ref 79.5–101.0)
MONO#: 0.2 10*3/uL (ref 0.1–0.9)
MONO%: 5.2 % (ref 0.0–14.0)
NEUT#: 2.4 10*3/uL (ref 1.5–6.5)
NEUT%: 63 % (ref 38.4–76.8)
NRBC: 0 % (ref 0–0)
PLATELETS: 139 10*3/uL — AB (ref 145–400)
RBC: 3.48 10*6/uL — ABNORMAL LOW (ref 3.70–5.45)
RDW: 14.7 % — AB (ref 11.2–14.5)
Retic %: 2.65 % — ABNORMAL HIGH (ref 0.70–2.10)
Retic Ct Abs: 92.22 10*3/uL — ABNORMAL HIGH (ref 33.70–90.70)
WBC: 3.9 10*3/uL (ref 3.9–10.3)
lymph#: 1.2 10*3/uL (ref 0.9–3.3)

## 2017-04-30 LAB — LACTATE DEHYDROGENASE: LDH: 186 U/L (ref 125–245)

## 2017-04-30 MED ORDER — TRAMADOL HCL 50 MG PO TABS: 50.0000 mg | ORAL_TABLET | Freq: Four times a day (QID) | ORAL | 0 refills | Status: DC | PRN

## 2017-04-30 NOTE — Telephone Encounter (Signed)
Gvpt appt for 07/30/17.

## 2017-05-01 LAB — BETA 2 MICROGLOBULIN, SERUM: Beta-2: 1.9 mg/L (ref 0.6–2.4)

## 2017-05-01 LAB — KAPPA/LAMBDA LIGHT CHAINS
Ig Kappa Free Light Chain: 274.6 mg/L — ABNORMAL HIGH (ref 3.3–19.4)
Ig Lambda Free Light Chain: 14.8 mg/L (ref 5.7–26.3)
Kappa/Lambda FluidC Ratio: 18.55 — ABNORMAL HIGH (ref 0.26–1.65)

## 2017-05-03 LAB — MULTIPLE MYELOMA PANEL, SERUM
ALPHA2 GLOB SERPL ELPH-MCNC: 0.6 g/dL (ref 0.4–1.0)
Albumin SerPl Elph-Mcnc: 3.9 g/dL (ref 2.9–4.4)
Albumin/Glob SerPl: 1.4 (ref 0.7–1.7)
Alpha 1: 0.3 g/dL (ref 0.0–0.4)
B-Globulin SerPl Elph-Mcnc: 1 g/dL (ref 0.7–1.3)
Gamma Glob SerPl Elph-Mcnc: 0.9 g/dL (ref 0.4–1.8)
Globulin, Total: 2.8 g/dL (ref 2.2–3.9)
IGA/IMMUNOGLOBULIN A, SERUM: 85 mg/dL — AB (ref 87–352)
IGG (IMMUNOGLOBIN G), SERUM: 906 mg/dL (ref 700–1600)
IGM (IMMUNOGLOBIN M), SRM: 48 mg/dL (ref 26–217)
M Protein SerPl Elph-Mcnc: 0.2 g/dL — ABNORMAL HIGH
TOTAL PROTEIN: 6.7 g/dL (ref 6.0–8.5)

## 2017-05-06 NOTE — Progress Notes (Signed)
Marland Kitchen    HEMATOLOGY/ONCOLOGY CLINIC NOTE  Date of Service: 04/30/2017  PCP: Marzetta Board MD  CHIEF COMPLAINTS/PURPOSE OF CONSULTATION:  Smoldering multiple myeloma  HISTORY OF PRESENTING ILLNESS:   Madison Parker is a wonderful 66 y.o. 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  Patient has a history of smoldering multiple myeloma which she reports she was diagnosed with in 2011 when she was evaluated by a hematologist in [place]. She reports that she presented with low blood counts (low WBC)   and after significant workup she had a bone marrow examination based on which she was told that she has smoldering multiple myeloma. Patient notes that she had a bone survey at that time which was negative. She was monitored there closely and then moved to [facility] in 2015 to stay with her son whose wife was being treated for breast cancer. Patient was following with Dr. Delight Hoh in [place]. She reports not having had any treatment for multiple myeloma.  Her last labs from about a year ago showed a SPEP with no OBSERVED M spike . Serum free light chains showed an elevation of kappa Free light chain 310 and lambda free light chain of 12.7 with an abnormal Kappa/ Lambda ratio of 24.38 ( up from 19 previously). Random urine showed an M protein complement of 44%.  She lives in [place] and requested transfer of care to [place]. She was offered follow-up but [facility] in [place]  But prefers to  follow [place] in [place]. Patient reports her energy levels been stable. She has chronic back pain which has improved since the patient had her spinal surgery in April 2017 (L4-L5 interbody fusion). Patient notes she has had some chronic left hip pain which is somewhat more bothersome with the navy on the lateral aspect of her upper thighs that's painful. She also notes some right hip pain. Over  the last few weeks he notes some upper neck pain especially when bending her neck backwards and tingling numbness in her right upper extremity.  Has been working on losing some weight voluntarily.  No acute other new symptoms.  INTERVAL HISTORY  Patient is here for f/u for her smoldering multiple myeloma. Labs show no hypercalcemia or renal failure.  No worsening/new anemia. Hemoglobin is stable in the mid-10 range. Previous PET CT scan on 03/13/2007 showed no evidence of concerning bone lesions suggestive of multiple myeloma. She notes no acute new symptoms.   MEDICAL HISTORY:  Past Medical History:  Diagnosis Date  . Headache   . Low back pain   . SBO (small bowel obstruction) ([place])   . Smoldering multiple myeloma ([facility])   Previous history of hypothyroidism- not currently medications Anxiety Obesity .Body mass index is 38.94 kg/m. Vitamin D deficiency Smoldering multiple myeloma diagnosed in 2011 Lumbosacral radiculopathy Left hip pain  SURGICAL HISTORY: Past Surgical History:  Procedure Laterality Date  . ABDOMINAL HYSTERECTOMY    . ABDOMINAL SURGERY    . BACK SURGERY    . GASTRIC BYPASS    . HERNIA REPAIR    . KNEE SURGERY     both  Spinal surgery April 2017  SOCIAL HISTORY: Social History   Social History  . Marital status: Married    Spouse name: N/A  . Number of children: N/A  . Years of education: N/A   Occupational History  . Not on file.   Social History Main  Topics  . Smoking status: Former Research scientist (life sciences)  . Smokeless tobacco: Never Used  . Alcohol use Yes     Comment: occ  . Drug use: No  . Sexual activity: Not on file   Other Topics Concern  . Not on file   Social History Narrative  . No narrative on file  Occasional alcohol use Former smoker and smoked 1 pack per week for about 9 years has since quit.  FAMILY HISTORY: History reviewed. No pertinent family history.  ALLERGIES:  is allergic to codeine.  MEDICATIONS:  Current Outpatient  Prescriptions  Medication Sig Dispense Refill  . acetaminophen (TYLENOL) 650 MG CR tablet Take 650 mg by mouth as needed.     . Calcium Carbonate-Vitamin D (CALCIUM 600+D) 600-200 MG-UNIT TABS Take by mouth.    . Cholecalciferol (VITAMIN D3) 2000 units TABS Take by mouth.    . diclofenac sodium (VOLTAREN) 1 % GEL Apply topically 4 (four) times daily.    Marland Kitchen gabapentin (NEURONTIN) 300 MG capsule Take 1 capsule (300 mg total) by mouth at bedtime. 30 capsule 0  . traMADol (ULTRAM) 50 MG tablet Take 1 tablet (50 mg total) by mouth every 6 (six) hours as needed. 60 tablet 0  . vitamin B-12 (CYANOCOBALAMIN) 100 MCG tablet Take 100 mcg by mouth daily.     No current facility-administered medications for this visit.     REVIEW OF SYSTEMS:    10 Point review of Systems was done is negative except as noted above.  PHYSICAL EXAMINATION: ECOG PERFORMANCE STATUS: 1 - Symptomatic but completely ambulatory  . Vitals:   04/30/17 1234  BP: 139/70  Pulse: 76  Resp: 16  Temp: 98.5 F (36.9 C)   Filed Weights   04/30/17 1234  Weight: 206 lb 1.6 oz (93.5 kg)   .Body mass index is 38.94 kg/m.  GENERAL:alert, in no acute distress and comfortable SKIN: skin color, texture, turgor are normal, no rashes or significant lesions EYES: normal, conjunctiva are pink and non-injected, sclera clear OROPHARYNX:no exudate, no erythema and lips, buccal mucosa, and tongue normal  NECK: supple, no JVD, thyroid normal size, non-tender, without nodularity LYMPH:  no palpable lymphadenopathy in the cervical, axillary or inguinal LUNGS: clear to auscultation with normal respiratory effort HEART: regular rate & rhythm,  no murmurs and no lower extremity edema ABDOMEN: abdomen soft, non-tender, normoactive bowel sounds  Musculoskeletal: no cyanosis of digits and no clubbing  PSYCH: alert & oriented x 3 with fluent speech NEURO: no focal motor/sensory deficits  LABORATORY DATA:  I have reviewed the data as  listed  . CBC Latest Ref Rng & Units 04/30/2017 03/07/2017 01/29/2017  WBC 3.9 - 10.3 10e3/uL 3.9 3.2(L) 3.2(L)  Hemoglobin 11.6 - 15.9 g/dL 10.6(L) 10.5(L) 11.3(L)  Hematocrit 34.8 - 46.6 % 32.9(L) 32.6(L) 34.9  Platelets 145 - 400 10e3/uL 139(L) 142(L) 160   . CBC    Component Value Date/Time   WBC 3.9 04/30/2017 1124   WBC 2.8 (L) 01/17/2017 0919   RBC 3.48 (L) 04/30/2017 1124   RBC 3.60 (L) 01/17/2017 0919   HGB 10.6 (L) 04/30/2017 1124   HCT 32.9 (L) 04/30/2017 1124   PLT 139 (L) 04/30/2017 1124   MCV 94.5 04/30/2017 1124   MCH 30.5 04/30/2017 1124   MCH 31.1 01/17/2017 0919   MCHC 32.2 04/30/2017 1124   MCHC 33.1 01/17/2017 0919   RDW 14.7 (H) 04/30/2017 1124   LYMPHSABS 1.2 04/30/2017 1124   MONOABS 0.2 04/30/2017 1124   EOSABS 0.0  04/30/2017 1124   EOSABS 0.1 06/03/2014 1003   BASOSABS 0.0 04/30/2017 1124     . CMP Latest Ref Rng & Units 04/30/2017 04/30/2017 03/07/2017  Glucose 70 - 140 mg/dl 90 - 94  BUN 7.0 - 26.0 mg/dL 14.6 - 21.1  Creatinine 0.6 - 1.1 mg/dL 1.0 - 0.8  Sodium 136 - 145 mEq/L 142 - 140  Potassium 3.5 - 5.1 mEq/L 3.7 - 3.8  Chloride 101 - 111 mmol/L - - -  CO2 22 - 29 mEq/L 23 - 24  Calcium 8.4 - 10.4 mg/dL 9.9 - 9.8  Total Protein 6.4 - 8.3 g/dL 7.2 6.7 7.1  Total Bilirubin 0.20 - 1.20 mg/dL 0.67 - 0.71  Alkaline Phos 40 - 150 U/L 101 - 107  AST 5 - 34 U/L 25 - 22  ALT 0 - 55 U/L 23 - 26   Component     Latest Ref Rng & Units 11/08/2016  LDH     125 - 245 U/L 220  Beta 2     0.6 - 2.4 mg/L 2.5 (H)           RADIOGRAPHIC STUDIES: I have personally reviewed the radiological images as listed and agreed with the findings in the report. No results found. Bone survey 11/08/2016 IMPRESSION: 1. Small lytic lesion in the right scapula. 2. Possible small lytic lesion in the left scapula. 3. Postoperative changes. 4. Significant degenerative changes in both hips, left greater than right. 5. No evidence for acute  fracture   Electronically Signed   By: Nolon Nations M.D.   On: 11/08/2016 16:19   ASSESSMENT & PLAN:   66 year old very pleasant lady with history of   1) Smoldering Multiple Myeloma vs Multiple myeloma (concern for small lytic lesions in left and right scapulae) - (appears light chain producing) This was apparently diagnosed in 2011 by her hematologist in [place]. Last couple of SPEP's showed no M spike but she had Increased serum kappa light chains with a K/L ratio of 19. Improved a little today. UPEP with M protein. Patient has some mild stable anemia with a hemoglobin of 10.5 today and some increased fatigue. No renal failure/hypercalcemia. Bone survey with concern fr possible small lytic lesions in B/L scapulae. PET/CT scan on 03/12/2017 showed no concerning bone lesions. Bm Bx with 17% kappa restriction plasma cells consistent with plasma cell neoplasm. No treatment so far.  2) IgG Lambda M spike of 0.2g/dl 3) Increased Kappa SFLC with increased K/L ratio 4) Chronic leukopenia - ? Related to SMM vs other nutritional deficiencies (h/o gastric bypass surgery put her at risk for nutritional deficiencies) PLAN -lab results discussed with the patient. -anemia and fatigue likely other etiologies for anemia including possible nutritional deficiencies in the setting of her gastric bypass surgery 0 B12 and ferritin ordered Thyroid function tests -She was recommended to take her B complex and B12 regularly. -Follow-up and optimize treatment for hypothyroidism with primary care physician.  3) Neuropathy/radiculopathy -continue on her gabapentin 300 mg by mouth at bedtime -Tramadol 50 mg by mouth every 6 hours as needed for her left hip arthritis pain and neuropathy/radiculopathy.  RTC with Dr Irene Limbo with labs in 73month  All of the patients questions were answered with apparent satisfaction. The patient knows to call the clinic with any problems, questions or  concerns.  I spent 20 minutes counseling the patient face to face. The total time spent in the appointment was 25 minutes and more than 50% was on counseling  and direct patient cares.    Sullivan Lone MD [place] AAHIVMS [facility] Hematology/Oncology Physician [facility]  (Office):       [phone] (Work cell):  [phone] (Fax):           [phone]

## 2017-07-02 DIAGNOSIS — M1612 Unilateral primary osteoarthritis, left hip: Secondary | ICD-10-CM | POA: Diagnosis not present

## 2017-07-10 DIAGNOSIS — Z01818 Encounter for other preprocedural examination: Secondary | ICD-10-CM | POA: Diagnosis not present

## 2017-07-12 ENCOUNTER — Other Ambulatory Visit: Payer: Self-pay

## 2017-07-12 DIAGNOSIS — C9000 Multiple myeloma not having achieved remission: Secondary | ICD-10-CM

## 2017-07-12 DIAGNOSIS — D472 Monoclonal gammopathy: Secondary | ICD-10-CM

## 2017-07-15 ENCOUNTER — Other Ambulatory Visit: Payer: Self-pay | Admitting: Hematology

## 2017-07-15 ENCOUNTER — Telehealth: Payer: Self-pay | Admitting: Hematology

## 2017-07-15 DIAGNOSIS — D472 Monoclonal gammopathy: Secondary | ICD-10-CM

## 2017-07-15 DIAGNOSIS — C9000 Multiple myeloma not having achieved remission: Secondary | ICD-10-CM

## 2017-07-15 NOTE — Telephone Encounter (Signed)
Scheduled appts per 9/28 sch msg. Spoke with patient regarding these apprs.

## 2017-07-18 ENCOUNTER — Telehealth: Payer: Self-pay

## 2017-07-18 ENCOUNTER — Other Ambulatory Visit: Payer: Self-pay | Admitting: Hematology

## 2017-07-18 NOTE — Telephone Encounter (Signed)
Spoke with patient regarding clearance for ortho surgery. Dr. Irene Limbo would like to see the results of her labs prior to clearing her. Pt rescheduled for 10/5 at 0915. Left VM with [facility] at [phone] to let them know that we received the fax for clearance and what Dr. Irene Limbo would like to review prior to clearing the pt for surgery.   Message sent to lab for labs ordered on 10/1 to be used for lab draw tomorrow (10/5).

## 2017-07-19 ENCOUNTER — Other Ambulatory Visit (HOSPITAL_BASED_OUTPATIENT_CLINIC_OR_DEPARTMENT_OTHER): Payer: Medicare Other

## 2017-07-19 DIAGNOSIS — C9000 Multiple myeloma not having achieved remission: Secondary | ICD-10-CM

## 2017-07-19 DIAGNOSIS — D72818 Other decreased white blood cell count: Secondary | ICD-10-CM | POA: Diagnosis not present

## 2017-07-19 DIAGNOSIS — D709 Neutropenia, unspecified: Secondary | ICD-10-CM | POA: Diagnosis not present

## 2017-07-19 DIAGNOSIS — D649 Anemia, unspecified: Secondary | ICD-10-CM | POA: Diagnosis not present

## 2017-07-19 DIAGNOSIS — R5383 Other fatigue: Secondary | ICD-10-CM | POA: Diagnosis not present

## 2017-07-19 DIAGNOSIS — D472 Monoclonal gammopathy: Secondary | ICD-10-CM

## 2017-07-19 DIAGNOSIS — E039 Hypothyroidism, unspecified: Secondary | ICD-10-CM

## 2017-07-19 LAB — COMPREHENSIVE METABOLIC PANEL
ALT: 42 U/L (ref 0–55)
ANION GAP: 9 meq/L (ref 3–11)
AST: 36 U/L — ABNORMAL HIGH (ref 5–34)
Albumin: 4.3 g/dL (ref 3.5–5.0)
Alkaline Phosphatase: 96 U/L (ref 40–150)
BILIRUBIN TOTAL: 1.03 mg/dL (ref 0.20–1.20)
BUN: 14.8 mg/dL (ref 7.0–26.0)
CALCIUM: 9.9 mg/dL (ref 8.4–10.4)
CO2: 25 mEq/L (ref 22–29)
CREATININE: 0.8 mg/dL (ref 0.6–1.1)
Chloride: 106 mEq/L (ref 98–109)
EGFR: 81 mL/min/{1.73_m2} — ABNORMAL LOW (ref >90)
Glucose: 86 mg/dl (ref 70–140)
Potassium: 3.7 mEq/L (ref 3.5–5.1)
Sodium: 140 mEq/L (ref 136–145)
TOTAL PROTEIN: 7.4 g/dL (ref 6.4–8.3)

## 2017-07-19 LAB — CBC & DIFF AND RETIC
BASO%: 0 % (ref 0.0–2.0)
BASOS ABS: 0 10*3/uL (ref 0.0–0.1)
EOS%: 0.8 % (ref 0.0–7.0)
Eosinophils Absolute: 0 10*3/uL (ref 0.0–0.5)
HEMATOCRIT: 34 % — AB (ref 34.8–46.6)
HGB: 11.3 g/dL — ABNORMAL LOW (ref 11.6–15.9)
IMMATURE RETIC FRACT: 11.6 % — AB (ref 1.60–10.00)
LYMPH#: 1.2 10*3/uL (ref 0.9–3.3)
LYMPH%: 51.3 % — ABNORMAL HIGH (ref 14.0–49.7)
MCH: 31.6 pg (ref 25.1–34.0)
MCHC: 33.2 g/dL (ref 31.5–36.0)
MCV: 95 fL (ref 79.5–101.0)
MONO#: 0.1 10*3/uL (ref 0.1–0.9)
MONO%: 5.1 % (ref 0.0–14.0)
NEUT#: 1 10*3/uL — ABNORMAL LOW (ref 1.5–6.5)
NEUT%: 42.8 % (ref 38.4–76.8)
PLATELETS: 143 10*3/uL — AB (ref 145–400)
RBC: 3.58 10*6/uL — ABNORMAL LOW (ref 3.70–5.45)
RDW: 13.6 % (ref 11.2–14.5)
RETIC CT ABS: 71.24 10*3/uL (ref 33.70–90.70)
Retic %: 1.99 % (ref 0.70–2.10)
WBC: 2.4 10*3/uL — ABNORMAL LOW (ref 3.9–10.3)

## 2017-07-19 LAB — LACTATE DEHYDROGENASE: LDH: 184 U/L (ref 125–245)

## 2017-07-19 LAB — PROTIME-INR
INR: 0.9 — AB (ref 2.00–3.50)
PROTIME: 10.8 s (ref 10.6–13.4)

## 2017-07-19 LAB — FERRITIN: Ferritin: 92 ng/ml (ref 9–269)

## 2017-07-19 LAB — TSH: TSH: 0.709 m(IU)/L (ref 0.308–3.960)

## 2017-07-20 LAB — T4, FREE: FREE T4: 1.19 ng/dL (ref 0.82–1.77)

## 2017-07-20 LAB — VITAMIN B12: Vitamin B12: 690 pg/mL (ref 232–1245)

## 2017-07-20 LAB — T3 UPTAKE
FREE THYROXINE INDEX: 1.8 (ref 1.2–4.9)
T3 Uptake Ratio: 29 % (ref 24–39)

## 2017-07-20 LAB — APTT: aPTT: 26 s (ref 24–33)

## 2017-07-20 LAB — T4: T4 TOTAL: 6.3 ug/dL (ref 4.5–12.0)

## 2017-07-21 LAB — THROMBIN TIME: Thrombin Time: 16.6 s (ref 0.0–23.0)

## 2017-07-22 LAB — KAPPA/LAMBDA LIGHT CHAINS
IG KAPPA FREE LIGHT CHAIN: 316.9 mg/L — AB (ref 3.3–19.4)
IG LAMBDA FREE LIGHT CHAIN: 14.2 mg/L (ref 5.7–26.3)
KAPPA/LAMBDA FLC RATIO: 22.32 — AB (ref 0.26–1.65)

## 2017-07-23 ENCOUNTER — Other Ambulatory Visit

## 2017-07-24 ENCOUNTER — Telehealth: Payer: Self-pay

## 2017-07-24 LAB — MULTIPLE MYELOMA PANEL, SERUM
ALPHA 1: 0.3 g/dL (ref 0.0–0.4)
ALPHA2 GLOB SERPL ELPH-MCNC: 0.6 g/dL (ref 0.4–1.0)
Albumin SerPl Elph-Mcnc: 3.9 g/dL (ref 2.9–4.4)
Albumin/Glob SerPl: 1.3 (ref 0.7–1.7)
B-GLOBULIN SERPL ELPH-MCNC: 1.2 g/dL (ref 0.7–1.3)
GLOBULIN, TOTAL: 3.1 g/dL (ref 2.2–3.9)
Gamma Glob SerPl Elph-Mcnc: 1 g/dL (ref 0.4–1.8)
IGG (IMMUNOGLOBIN G), SERUM: 856 mg/dL (ref 700–1600)
IgA, Qn, Serum: 78 mg/dL — ABNORMAL LOW (ref 87–352)
IgM, Qn, Serum: 43 mg/dL (ref 26–217)
M PROTEIN SERPL ELPH-MCNC: 0.3 g/dL — AB
Total Protein: 7 g/dL (ref 6.0–8.5)

## 2017-07-24 NOTE — Telephone Encounter (Signed)
Spoke with Madison Parker at [facility]. Not all lab values have resulted yet. Dr. Irene Limbo to discuss lab results and assessment with pt prior to providing clearance for surgery. Appt with Dr. Irene Limbo is scheduled for 10/16.

## 2017-07-30 ENCOUNTER — Encounter: Payer: Self-pay | Admitting: Hematology

## 2017-07-30 ENCOUNTER — Other Ambulatory Visit

## 2017-07-30 ENCOUNTER — Ambulatory Visit (HOSPITAL_BASED_OUTPATIENT_CLINIC_OR_DEPARTMENT_OTHER): Payer: Medicare Other | Admitting: Hematology

## 2017-07-30 ENCOUNTER — Ambulatory Visit (HOSPITAL_BASED_OUTPATIENT_CLINIC_OR_DEPARTMENT_OTHER): Payer: Medicare Other

## 2017-07-30 ENCOUNTER — Telehealth: Payer: Self-pay | Admitting: Hematology

## 2017-07-30 VITALS — BP 161/89 | HR 83 | Temp 98.3°F | Resp 18 | Ht 61.0 in | Wt 196.6 lb

## 2017-07-30 DIAGNOSIS — C9000 Multiple myeloma not having achieved remission: Secondary | ICD-10-CM

## 2017-07-30 DIAGNOSIS — D472 Monoclonal gammopathy: Secondary | ICD-10-CM

## 2017-07-30 DIAGNOSIS — E538 Deficiency of other specified B group vitamins: Secondary | ICD-10-CM | POA: Diagnosis not present

## 2017-07-30 DIAGNOSIS — D649 Anemia, unspecified: Secondary | ICD-10-CM

## 2017-07-30 LAB — CBC & DIFF AND RETIC
BASO%: 0.4 % (ref 0.0–2.0)
Basophils Absolute: 0 10*3/uL (ref 0.0–0.1)
EOS ABS: 0 10*3/uL (ref 0.0–0.5)
EOS%: 0.9 % (ref 0.0–7.0)
HCT: 34.4 % — ABNORMAL LOW (ref 34.8–46.6)
HEMOGLOBIN: 11.2 g/dL — AB (ref 11.6–15.9)
IMMATURE RETIC FRACT: 6.7 % (ref 1.60–10.00)
LYMPH#: 1.3 10*3/uL (ref 0.9–3.3)
LYMPH%: 56.2 % — ABNORMAL HIGH (ref 14.0–49.7)
MCH: 31.2 pg (ref 25.1–34.0)
MCHC: 32.6 g/dL (ref 31.5–36.0)
MCV: 95.8 fL (ref 79.5–101.0)
MONO#: 0.1 10*3/uL (ref 0.1–0.9)
MONO%: 4 % (ref 0.0–14.0)
NEUT%: 38.5 % (ref 38.4–76.8)
NEUTROS ABS: 0.9 10*3/uL — AB (ref 1.5–6.5)
Platelets: 145 10*3/uL (ref 145–400)
RBC: 3.59 10*6/uL — AB (ref 3.70–5.45)
RDW: 13.3 % (ref 11.2–14.5)
RETIC %: 1.15 % (ref 0.70–2.10)
RETIC CT ABS: 41.29 10*3/uL (ref 33.70–90.70)
WBC: 2.3 10*3/uL — AB (ref 3.9–10.3)

## 2017-07-30 LAB — COMPREHENSIVE METABOLIC PANEL
ALBUMIN: 4 g/dL (ref 3.5–5.0)
ALK PHOS: 99 U/L (ref 40–150)
ALT: 20 U/L (ref 0–55)
AST: 18 U/L (ref 5–34)
Anion Gap: 8 mEq/L (ref 3–11)
BILIRUBIN TOTAL: 0.88 mg/dL (ref 0.20–1.20)
BUN: 15.2 mg/dL (ref 7.0–26.0)
CHLORIDE: 108 meq/L (ref 98–109)
CO2: 23 mEq/L (ref 22–29)
CREATININE: 0.8 mg/dL (ref 0.6–1.1)
Calcium: 9.7 mg/dL (ref 8.4–10.4)
Glucose: 99 mg/dl (ref 70–140)
Potassium: 3.6 mEq/L (ref 3.5–5.1)
SODIUM: 139 meq/L (ref 136–145)
TOTAL PROTEIN: 7.2 g/dL (ref 6.4–8.3)

## 2017-07-30 LAB — FERRITIN: FERRITIN: 81 ng/mL (ref 9–269)

## 2017-07-30 NOTE — Progress Notes (Signed)
Marland Kitchen    HEMATOLOGY/ONCOLOGY CLINIC NOTE  Date of Service: 07/30/17   PCP: Marzetta Board MD  CHIEF COMPLAINTS/PURPOSE OF CONSULTATION:  Smoldering multiple myeloma  HISTORY OF PRESENTING ILLNESS:   Madison Parker is a wonderful 66 y.o. 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  Patient has a history of smoldering multiple myeloma which she reports she was diagnosed with in 2011 when she was evaluated by a hematologist in [place]. She reports that she presented with low blood counts (low WBC)   and after significant workup she had a bone marrow examination based on which she was told that she has smoldering multiple myeloma. Patient notes that she had a bone survey at that time which was negative. She was monitored there closely and then moved to [facility] in 2015 to stay with her son whose wife was being treated for breast cancer. Patient was following with Dr. Delight Hoh in [place]. She reports not having had any treatment for multiple myeloma.  Her last labs from about a year ago showed a SPEP with no OBSERVED M spike . Serum free light chains showed an elevation of kappa Free light chain 310 and lambda free light chain of 12.7 with an abnormal Kappa/ Lambda ratio of 24.38 ( up from 19 previously). Random urine showed an M protein complement of 44%.  She lives in [place] and requested transfer of care to [place]. She was offered follow-up but [facility] in [place]  But prefers to  follow [place] in [place]. Patient reports her energy levels been stable. She has chronic back pain which has improved since the patient had her spinal surgery in April 2017 (L4-L5 interbody fusion). Patient notes she has had some chronic left hip pain which is somewhat more bothersome with the navy on the lateral aspect of her upper thighs that's painful. She also notes some right hip pain. Over  the last few weeks he notes some upper neck pain especially when bending her neck backwards and tingling numbness in her right upper extremity.  Has been working on losing some weight voluntarily.  No acute other new symptoms.  INTERVAL HISTORY  Madison Parker is here for f/u for her smoldering multiple myeloma. She reports that she is doing well overall. She reports leg cramps and significant left hip discomfort due to severe arthritis. She has tried PT which did not relieve the pain. She will have an operation on her left hip first. Labs are stable and with no evidence of MM progression at this time. She was given Voltaren and is no longer taking it since 1 month ago. She is also off of the gabapentin for a month now. Has chronic potentially worsened by NSAIDS. SHe is very desirous to proceed with left hip surgery and paperwork for this was sent to her orthopedic surgeon.  On review of systems, pt reports severe hip pain, and denies fever, chills and any other accompanying symptoms.    MEDICAL HISTORY:  Past Medical History:  Diagnosis Date  . Headache   . Low back pain   . SBO (small bowel obstruction) ([place])   . Smoldering multiple myeloma (HCC)   Previous history of hypothyroidism- not currently medications Anxiety Obesity .Body mass index is 37.15 kg/m. Vitamin D deficiency Smoldering multiple myeloma diagnosed in 2011 Lumbosacral radiculopathy Left hip pain  SURGICAL HISTORY: Past Surgical History:  Procedure Laterality Date  . ABDOMINAL HYSTERECTOMY    .  ABDOMINAL SURGERY    . BACK SURGERY    . GASTRIC BYPASS    . HERNIA REPAIR    . KNEE SURGERY     both  Spinal surgery April 2017  SOCIAL HISTORY: Social History   Social History  . Marital status: Married    Spouse name: N/A  . Number of children: N/A  . Years of education: N/A   Occupational History  . Not on file.   Social History Main Topics  . Smoking status: Former Research scientist (life sciences)  . Smokeless tobacco: Never  Used  . Alcohol use Yes     Comment: occ  . Drug use: No  . Sexual activity: Not on file   Other Topics Concern  . Not on file   Social History Narrative  . No narrative on file  Occasional alcohol use Former smoker and smoked 1 pack per week for about 9 years has since quit.  FAMILY HISTORY: No family history on file.  ALLERGIES:  is allergic to codeine.  MEDICATIONS:  Current Outpatient Prescriptions  Medication Sig Dispense Refill  . acetaminophen (TYLENOL) 650 MG CR tablet Take 650 mg by mouth as needed.     . Calcium Carbonate-Vitamin D (CALCIUM 600+D) 600-200 MG-UNIT TABS Take by mouth.    . Cholecalciferol (VITAMIN D3) 2000 units TABS Take by mouth.    . diclofenac sodium (VOLTAREN) 1 % GEL Apply topically 4 (four) times daily.    Marland Kitchen gabapentin (NEURONTIN) 300 MG capsule Take 1 capsule (300 mg total) by mouth at bedtime. 30 capsule 0  . traMADol (ULTRAM) 50 MG tablet Take 1 tablet (50 mg total) by mouth every 6 (six) hours as needed. 60 tablet 0  . vitamin B-12 (CYANOCOBALAMIN) 100 MCG tablet Take 100 mcg by mouth daily.     No current facility-administered medications for this visit.     REVIEW OF SYSTEMS:    10 Point review of Systems was done is negative except as noted above.  PHYSICAL EXAMINATION: ECOG PERFORMANCE STATUS: 1 - Symptomatic but completely ambulatory  . Vitals:   07/30/17 1042  BP: (!) 161/89  Pulse: 83  Resp: 18  Temp: 98.3 F (36.8 C)  SpO2: 100%   Filed Weights   07/30/17 1042  Weight: 196 lb 9.6 oz (89.2 kg)   .Body mass index is 37.15 kg/m.  GENERAL:alert, in no acute distress and comfortable SKIN: skin color, texture, turgor are normal, no rashes or significant lesions EYES: normal, conjunctiva are pink and non-injected, sclera clear OROPHARYNX:no exudate, no erythema and lips, buccal mucosa, and tongue normal  NECK: supple, no JVD, thyroid normal size, non-tender, without nodularity LYMPH:  no palpable lymphadenopathy in the  cervical, axillary or inguinal LUNGS: clear to auscultation with normal respiratory effort HEART: regular rate & rhythm,  no murmurs and no lower extremity edema ABDOMEN: abdomen soft, non-tender, normoactive bowel sounds  Musculoskeletal: no cyanosis of digits and no clubbing  PSYCH: alert & oriented x 3 with fluent speech NEURO: no focal motor/sensory deficits  LABORATORY DATA:  I have reviewed the data as listed  . CBC Latest Ref Rng & Units 07/30/2017 07/19/2017 04/30/2017  WBC 3.9 - 10.3 10e3/uL 2.3(L) 2.4(L) 3.9  Hemoglobin 11.6 - 15.9 g/dL 11.2(L) 11.3(L) 10.6(L)  Hematocrit 34.8 - 46.6 % 34.4(L) 34.0(L) 32.9(L)  Platelets 145 - 400 10e3/uL 145 143(L) 139(L)  ANC 900 . CBC    Component Value Date/Time   WBC 2.3 (L) 07/30/2017 1131   WBC 2.8 (L) 01/17/2017 7353  RBC 3.59 (L) 07/30/2017 1131   RBC 3.60 (L) 01/17/2017 0919   HGB 11.2 (L) 07/30/2017 1131   HCT 34.4 (L) 07/30/2017 1131   PLT 145 07/30/2017 1131   MCV 95.8 07/30/2017 1131   MCH 31.2 07/30/2017 1131   MCH 31.1 01/17/2017 0919   MCHC 32.6 07/30/2017 1131   MCHC 33.1 01/17/2017 0919   RDW 13.3 07/30/2017 1131   LYMPHSABS 1.3 07/30/2017 1131   MONOABS 0.1 07/30/2017 1131   EOSABS 0.0 07/30/2017 1131   EOSABS 0.1 06/03/2014 1003   BASOSABS 0.0 07/30/2017 1131     . CMP Latest Ref Rng & Units 07/30/2017 07/30/2017 07/19/2017  Glucose 70 - 140 mg/dl 99 - 86  BUN 7.0 - 26.0 mg/dL 15.2 - 14.8  Creatinine 0.6 - 1.1 mg/dL 0.8 - 0.8  Sodium 136 - 145 mEq/L 139 - 140  Potassium 3.5 - 5.1 mEq/L 3.6 - 3.7  Chloride 101 - 111 mmol/L - - -  CO2 22 - 29 mEq/L 23 - 25  Calcium 8.4 - 10.4 mg/dL 9.7 - 9.9  Total Protein 6.4 - 8.3 g/dL 7.2 6.8 7.4  Total Bilirubin 0.20 - 1.20 mg/dL 0.88 - 1.03  Alkaline Phos 40 - 150 U/L 99 - 96  AST 5 - 34 U/L 18 - 36(H)  ALT 0 - 55 U/L 20 - 42   Component     Latest Ref Rng & Units 11/08/2016  LDH     125 - 245 U/L 220  Beta 2     0.6 - 2.4 mg/L 2.5 (H)   Component      Latest Ref Rng & Units 07/30/2017  Ferritin     9 - 269 ng/ml 81  Vitamin B12     232 - 1,245 pg/mL 594           RADIOGRAPHIC STUDIES: I have personally reviewed the radiological images as listed and agreed with the findings in the report. No results found. Bone survey 11/08/2016 IMPRESSION: 1. Small lytic lesion in the right scapula. 2. Possible small lytic lesion in the left scapula. 3. Postoperative changes. 4. Significant degenerative changes in both hips, left greater than right. 5. No evidence for acute fracture   Electronically Signed   By: Nolon Nations M.D.   On: 11/08/2016 16:19   ASSESSMENT & PLAN:   66 year old very pleasant lady with history of   1) Smoldering Multiple Myeloma vs Multiple myeloma (concern for small lytic lesions in left and right scapulae) - (appears light chain producing) This was apparently diagnosed in 2011 by her hematologist in [place]. Patient has some mild stable anemia with a hemoglobin of 11.2 today  No renal failure/hypercalcemia. Bone survey with concern fr possible small lytic lesions in B/L scapulae. PET/CT scan on 03/12/2017 showed no concerning bone lesions. Bm Bx with 17% kappa restriction plasma cells consistent with plasma cell neoplasm. No treatment so far.  2) IgG Lambda M spike of 0.2g/dl 3)Stable Kappa Lambda SFLC with increased K/L ratio 4) Chronic leukopenia - ? Related to SMM vs other nutritional deficiencies (h/o gastric bypass surgery put her at risk for nutritional deficiencies) B12 levels WNL Ferritin adequate ?additional factor -recent NSAID use. ?element of benign ethnic neutropenia. Has not had any issues with frequent infection. Anticipate will rise appropriately with surgery. PLAN -lab results discussed with the patient. -no overt contraindication to the patient upcoming Left hip THA. -she understand given SMM - she might be at higher risk of post-op infection and VTE  and  anemia. -would recommend appropriate peri-operative VTE prophylaxis -She was recommended to take her B complex and B12 regularly. -reasonable to take Iron polysaccharide 156m po daily to maintain Iron stores given anticipated blood loss with surgery. -Follow-up and optimize treatment for hypothyroidism with primary care physician.  3) Neuropathy/radiculopathy -continue on her gabapentin 300 mg by mouth at bedtime -Tramadol 50 mg by mouth every 6 hours as needed for her left hip arthritis pain and neuropathy/radiculopathy.   RTC with Dr KIrene Limbowith labs in 212monthwith rpt labs  Parker of the patients questions were answered with apparent satisfaction. The patient knows to call the clinic with any problems, questions or concerns.  I spent 20 minutes counseling the patient face to face. The total time spent in the appointment was 25 minutes and more than 50% was on counseling and direct patient cares.    GaSullivan LoneD MSAtlantisAHIVMS SCMillennium Surgery CenterTHardin County General Hospitalematology/Oncology Physician CoNeosho [place](Office):       [phone] cell):  [phone]):           [phone] document serves as a record of services personally performed by GaSullivan LoneMD. It was created on her behalf by LaAlean Rinnea trained medical scribe. The creation of this record is based on the scribe's personal observations and the provider's statements to them. This document has been checked and approved by the attending provider.

## 2017-07-30 NOTE — Telephone Encounter (Signed)
Gave avs and calendar for December  °

## 2017-07-31 LAB — VITAMIN B12: Vitamin B12: 594 pg/mL (ref 232–1245)

## 2017-08-05 LAB — MULTIPLE MYELOMA PANEL, SERUM
ALBUMIN SERPL ELPH-MCNC: 3.7 g/dL (ref 2.9–4.4)
ALPHA2 GLOB SERPL ELPH-MCNC: 0.7 g/dL (ref 0.4–1.0)
Albumin/Glob SerPl: 1.2 (ref 0.7–1.7)
Alpha 1: 0.2 g/dL (ref 0.0–0.4)
B-Globulin SerPl Elph-Mcnc: 1.1 g/dL (ref 0.7–1.3)
Gamma Glob SerPl Elph-Mcnc: 1 g/dL (ref 0.4–1.8)
Globulin, Total: 3.1 g/dL (ref 2.2–3.9)
IGG (IMMUNOGLOBIN G), SERUM: 847 mg/dL (ref 700–1600)
IGM (IMMUNOGLOBIN M), SRM: 43 mg/dL (ref 26–217)
IgA, Qn, Serum: 80 mg/dL — ABNORMAL LOW (ref 87–352)
M PROTEIN SERPL ELPH-MCNC: 0.2 g/dL — AB
TOTAL PROTEIN: 6.8 g/dL (ref 6.0–8.5)

## 2017-08-08 ENCOUNTER — Telehealth: Payer: Self-pay

## 2017-08-08 NOTE — Telephone Encounter (Signed)
Left VM with Anderson Malta, Network engineer at [employer] [place] [phone]. Pt requesting update on surgical clearance from Dr. Grier Mitts office. Pt met with physician and was cleared. Paperwork faxed to [facility] on 10/19 at 1120. Expecting a call back from office to confirm receipt and get back in touch with the pt.

## 2017-08-09 ENCOUNTER — Telehealth: Payer: Self-pay

## 2017-08-09 NOTE — Telephone Encounter (Signed)
"  This is [place] calling to notify nurse for Dr. Irene Limbo we did not receive the fax."  Call transferred.  Advised to confirm fax number.

## 2017-08-09 NOTE — Telephone Encounter (Signed)
Left VM with Anderson Malta, Network engineer to confirm fax number for Lacretia Nicks to send Surgical Clearance for pt. Attempt to send fax to [phone] and fax failed. Received call from patient and nurse today.

## 2017-08-09 NOTE — Telephone Encounter (Signed)
Left VM with secretary Anderson Malta to have Lacretia Nicks return my call to confirm fax number of [phone]. Attempted fax on 10/25 and fax failed. Continuing to receive calls during the day from pt and [place] at [employer]. No return call at this time from [place].

## 2017-08-12 ENCOUNTER — Telehealth: Payer: Self-pay | Admitting: *Deleted

## 2017-08-12 NOTE — Telephone Encounter (Signed)
Pt called to verify surgical clearance paper work was sent to [employer].  Per 10/26 phone note, waiting on fax number confirmation due to rejection of previously sent fax.  Pt returned call with correct fax number.  Paper work faxed to requested number, confirmation received.  Pt verbalized understanding.

## 2017-08-13 ENCOUNTER — Other Ambulatory Visit: Payer: Self-pay | Admitting: Orthopedic Surgery

## 2017-08-13 NOTE — Patient Instructions (Addendum)
Madison Parker  08/13/2017   Your procedure is scheduled on: Friday 08/16/2017  Report to [facility] Main  Entrance Take [place]  elevators to 3rd floor to  [place] at 0830  AM.     Call this number if you have problems the morning of surgery [phone]    Remember: ONLY 1 PERSON MAY GO WITH YOU TO SHORT STAY TO GET  READY MORNING OF [place].  Do not eat food or drink liquids :After Midnight.     Take these medicines the morning of surgery with A SIP OF WATER: NONE                                You may not have any metal on your body including hair pins and              piercings  Do not wear jewelry, make-up, lotions, powders or perfumes, deodorant             Do not wear nail polish.  Do not shave  48 hours prior to surgery.              Men may shave face and neck.   Do not bring valuables to the hospital. [place].  Contacts, dentures or bridgework may not be worn into surgery.  Leave suitcase in the car. After surgery it may be brought to your room.                  Please read over the following fact sheets you were given: _____________________________________________________________________             [facility] - Preparing for Surgery Before surgery, you can play an important role.  Because skin is not sterile, your skin needs to be as free of germs as possible.  You can reduce the number of germs on your skin by washing with CHG (chlorahexidine gluconate) soap before surgery.  CHG is an antiseptic cleaner which kills germs and bonds with the skin to continue killing germs even after washing. Please DO NOT use if you have an allergy to CHG or antibacterial soaps.  If your skin becomes reddened/irritated stop using the CHG and inform your nurse when you arrive at Short Stay. Do not shave (including legs and underarms) for at least 48 hours prior to the first CHG shower.  You may  shave your face/neck. Please follow these instructions carefully:  1.  Shower with CHG Soap the night before surgery and the  morning of Surgery.  2.  If you choose to wash your hair, wash your hair first as usual with your  normal  shampoo.  3.  After you shampoo, rinse your hair and body thoroughly to remove the  shampoo.                           4.  Use CHG as you would any other liquid soap.  You can apply chg directly  to the skin and wash                       Gently with a scrungie or clean washcloth.  5.  Apply the CHG Soap to your body ONLY FROM THE NECK DOWN.   Do not use on face/ open                           Wound or open sores. Avoid contact with eyes, ears mouth and genitals (private parts).                       Wash face,  Genitals (private parts) with your normal soap.             6.  Wash thoroughly, paying special attention to the area where your surgery  will be performed.  7.  Thoroughly rinse your body with warm water from the neck down.  8.  DO NOT shower/wash with your normal soap after using and rinsing off  the CHG Soap.                9.  Pat yourself dry with a clean towel.            10.  Wear clean pajamas.            11.  Place clean sheets on your bed the night of your first shower and do not  sleep with pets. Day of Surgery : Do not apply any lotions/deodorants the morning of surgery.  Please wear clean clothes to the hospital/surgery center.  FAILURE TO FOLLOW THESE INSTRUCTIONS MAY RESULT IN THE CANCELLATION OF YOUR SURGERY PATIENT SIGNATURE_________________________________  NURSE SIGNATURE__________________________________  ________________________________________________________________________   Madison Parker  An incentive spirometer is a tool that can help keep your lungs clear and active. This tool measures how well you are filling your lungs with each breath. Taking long deep breaths may help reverse or decrease the chance of developing  breathing (pulmonary) problems (especially infection) following:  A long period of time when you are unable to move or be active. BEFORE THE PROCEDURE   If the spirometer includes an indicator to show your best effort, your nurse or respiratory therapist will set it to a desired goal.  If possible, sit up straight or lean slightly forward. Try not to slouch.  Hold the incentive spirometer in an upright position. INSTRUCTIONS FOR USE  1. Sit on the edge of your bed if possible, or sit up as far as you can in bed or on a chair. 2. Hold the incentive spirometer in an upright position. 3. Breathe out normally. 4. Place the mouthpiece in your mouth and seal your lips tightly around it. 5. Breathe in slowly and as deeply as possible, raising the piston or the ball toward the top of the column. 6. Hold your breath for 3-5 seconds or for as long as possible. Allow the piston or ball to fall to the bottom of the column. 7. Remove the mouthpiece from your mouth and breathe out normally. 8. Rest for a few seconds and repeat Steps 1 through 7 at least 10 times every 1-2 hours when you are awake. Take your time and take a few normal breaths between deep breaths. 9. The spirometer may include an indicator to show your best effort. Use the indicator as a goal to work toward during each repetition. 10. After each set of 10 deep breaths, practice coughing to be sure your lungs are clear. If you have an incision (the cut made at the time of surgery), support your incision when coughing by placing a pillow or  rolled up towels firmly against it. Once you are able to get out of bed, walk around indoors and cough well. You may stop using the incentive spirometer when instructed by your caregiver.  RISKS AND COMPLICATIONS  Take your time so you do not get dizzy or light-headed.  If you are in pain, you may need to take or ask for pain medication before doing incentive spirometry. It is harder to take a deep  breath if you are having pain. AFTER USE  Rest and breathe slowly and easily.  It can be helpful to keep track of a log of your progress. Your caregiver can provide you with a simple table to help with this. If you are using the spirometer at home, follow these instructions: [place] IF:   You are having difficultly using the spirometer.  You have trouble using the spirometer as often as instructed.  Your pain medication is not giving enough relief while using the spirometer.  You develop fever of 100.5 F (38.1 C) or higher. SEEK IMMEDIATE MEDICAL CARE IF:   You cough up bloody sputum that had not been present before.  You develop fever of 102 F (38.9 C) or greater.  You develop worsening pain at or near the incision site. MAKE SURE YOU:   Understand these instructions.  Will watch your condition.  Will get help right away if you are not doing well or get worse. Document Released: 02/11/2007 Document Revised: 12/24/2011 Document Reviewed: 04/14/2007 ExitCare Patient Information 2014 ExitCare, [place].   ________________________________________________________________________  WHAT IS A BLOOD TRANSFUSION? Blood Transfusion Information  A transfusion is the replacement of blood or some of its parts. Blood is made up of multiple cells which provide different functions.  Red blood cells carry oxygen and are used for blood loss replacement.  White blood cells fight against infection.  Platelets control bleeding.  Plasma helps clot blood.  Other blood products are available for specialized needs, such as hemophilia or other clotting disorders. BEFORE THE TRANSFUSION  Who gives blood for transfusions?   Healthy volunteers who are fully evaluated to make sure their blood is safe. This is blood bank blood. Transfusion therapy is the safest it has ever been in the practice of medicine. Before blood is taken from a donor, a complete history is taken to make sure  that person has no history of diseases nor engages in risky social behavior (examples are intravenous drug use or sexual activity with multiple partners). The donor's travel history is screened to minimize risk of transmitting infections, such as malaria. The donated blood is tested for signs of infectious diseases, such as HIV and hepatitis. The blood is then tested to be sure it is compatible with you in order to minimize the chance of a transfusion reaction. If you or a relative donates blood, this is often done in anticipation of surgery and is not appropriate for emergency situations. It takes many days to process the donated blood. RISKS AND COMPLICATIONS Although transfusion therapy is very safe and saves many lives, the main dangers of transfusion include:   Getting an infectious disease.  Developing a transfusion reaction. This is an allergic reaction to something in the blood you were given. Every precaution is taken to prevent this. The decision to have a blood transfusion has been considered carefully by your caregiver before blood is given. Blood is not given unless the benefits outweigh the risks. AFTER THE TRANSFUSION  Right after receiving a blood transfusion, you will usually  feel much better and more energetic. This is especially true if your red blood cells have gotten low (anemic). The transfusion raises the level of the red blood cells which carry oxygen, and this usually causes an energy increase.  The nurse administering the transfusion will monitor you carefully for complications. HOME CARE INSTRUCTIONS  No special instructions are needed after a transfusion. You may find your energy is better. Speak with your caregiver about any limitations on activity for underlying diseases you may have. SEEK MEDICAL CARE IF:   Your condition is not improving after your transfusion.  You develop redness or irritation at the intravenous (IV) site. SEEK IMMEDIATE MEDICAL CARE IF:  Any of  the following symptoms occur over the next 12 hours:  Shaking chills.  You have a temperature by mouth above 102 F (38.9 C), not controlled by medicine.  Chest, back, or muscle pain.  People around you feel you are not acting correctly or are confused.  Shortness of breath or difficulty breathing.  Dizziness and fainting.  You get a rash or develop hives.  You have a decrease in urine output.  Your urine turns a dark color or changes to pink, red, or brown. Any of the following symptoms occur over the next 10 days:  You have a temperature by mouth above 102 F (38.9 C), not controlled by medicine.  Shortness of breath.  Weakness after normal activity.  The white part of the eye turns yellow (jaundice).  You have a decrease in the amount of urine or are urinating less often.  Your urine turns a dark color or changes to pink, red, or brown. Document Released: 09/28/2000 Document Revised: 12/24/2011 Document Reviewed: 05/17/2008 [facility] Patient Information 2014 [place], [place].  _______________________________________________________________________

## 2017-08-13 NOTE — Progress Notes (Signed)
07/10/2017- EKG on chart from [facility] 07/30/2017- noted in EPIC- labs- CBC and diff., Retic, Ferritin, Multiple myeloma, Vitamin B12, CMP

## 2017-08-14 ENCOUNTER — Encounter (HOSPITAL_COMMUNITY)
Admission: RE | Admit: 2017-08-14 | Discharge: 2017-08-14 | Disposition: A | Discharge status: Home / Self Care | Payer: Medicare Other | Source: Ambulatory Visit | Attending: Orthopedic Surgery | Admitting: Orthopedic Surgery

## 2017-08-14 ENCOUNTER — Other Ambulatory Visit (HOSPITAL_COMMUNITY): Payer: Self-pay | Admitting: *Deleted

## 2017-08-14 ENCOUNTER — Encounter (HOSPITAL_COMMUNITY): Payer: Self-pay

## 2017-08-14 LAB — CBC
HEMATOCRIT: 32.8 % — AB (ref 36.0–46.0)
HEMOGLOBIN: 10.9 g/dL — AB (ref 12.0–15.0)
MCH: 31.3 pg (ref 26.0–34.0)
MCHC: 33.2 g/dL (ref 30.0–36.0)
MCV: 94.3 fL (ref 78.0–100.0)
Platelets: 135 10*3/uL — ABNORMAL LOW (ref 150–400)
RBC: 3.48 MIL/uL — ABNORMAL LOW (ref 3.87–5.11)
RDW: 13.1 % (ref 11.5–15.5)
WBC: 3.1 10*3/uL — ABNORMAL LOW (ref 4.0–10.5)

## 2017-08-14 LAB — SURGICAL PCR SCREEN
MRSA, PCR: NEGATIVE
Staphylococcus aureus: NEGATIVE

## 2017-08-14 LAB — BASIC METABOLIC PANEL
Anion gap: 7 (ref 5–15)
BUN: 21 mg/dL — AB (ref 6–20)
CHLORIDE: 111 mmol/L (ref 101–111)
CO2: 24 mmol/L (ref 22–32)
Calcium: 9.5 mg/dL (ref 8.9–10.3)
Creatinine, Ser: 0.74 mg/dL (ref 0.44–1.00)
GFR calc Af Amer: >60 mL/min (ref >60)
GFR calc non Af Amer: >60 mL/min (ref >60)
GLUCOSE: 89 mg/dL (ref 65–99)
POTASSIUM: 4.4 mmol/L (ref 3.5–5.1)
SODIUM: 142 mmol/L (ref 135–145)

## 2017-08-14 LAB — ABO/RH: ABO/RH(D): B POS

## 2017-08-14 NOTE — Progress Notes (Signed)
ONCOLOGY SURGICAL CLEARANCE NOTE DR Irene Limbo ON CHART FOR 08-16-17 SURGERY

## 2017-08-15 NOTE — H&P (Addendum)
TOTAL HIP ADMISSION H&P  Patient is admitted for left total hip arthroplasty.  Subjective:  Chief Complaint: left hip pain  HPI: Madison Parker, 66 y.o. female, has a history of pain and functional disability in the left hip(s) due to arthritis and patient has failed non-surgical conservative treatments for greater than 12 weeks to include NSAID's and/or analgesics, flexibility and strengthening excercises, weight reduction as appropriate and activity modification.  Onset of symptoms was gradual starting 3 years ago with gradually worsening course since that time.The patient noted no past surgery on the left hip(s).  Patient currently rates pain in the left hip at 9 out of 10 with activity. Patient has night pain, worsening of pain with activity and weight bearing, trendelenberg gait, pain that interfers with activities of daily living, pain with passive range of motion, crepitus and joint swelling. Patient has evidence of subchondral cysts, subchondral sclerosis, joint subluxation and joint space narrowing by imaging studies. This condition presents safety issues increasing the risk of falls. This patient has had failure of all reasonable conservative care.  There is no current active infection.  Patient Active Problem List   Diagnosis Date Noted  . Hypothyroidism 11/08/2016  . Smoldering multiple myeloma ([place]) 11/08/2016  . H/O bariatric surgery 02/02/2016  . Degenerative joint disease of left hip 01/18/2016  . Anemia 11/22/2015  . Left hip pain 11/22/2015  . Left shoulder pain 11/22/2015  . Lumbar spondylosis 11/22/2015   Past Medical History:  Diagnosis Date  . Headache   . Low back pain   . SBO (small bowel obstruction) ([place]) 2010  . Smoldering multiple myeloma ([place])     Past Surgical History:  Procedure Laterality Date  . ABDOMINAL HYSTERECTOMY     complete  . BACK SURGERY     lower at [facility]  . colonscopy  2014  . GASTRIC BYPASS  yrs ago  . HERNIA REPAIR    . KNEE SURGERY   06/04/2009   both knees replaced   . TONSILLECTOMY  age 40    No current facility-administered medications for this encounter.    Current Outpatient Prescriptions  Medication Sig Dispense Refill Last Dose  . B Complex-C (B-COMPLEX WITH VITAMIN C) tablet Take 1 tablet by mouth daily.     . Calcium Carbonate-Vitamin D (CALCIUM 600+D) 600-200 MG-UNIT TABS Take 1 tablet by mouth daily.    Taking  . Cholecalciferol (VITAMIN D3) 2000 units TABS Take 2,000 Units by mouth daily.    Taking  . ibuprofen (ADVIL,MOTRIN) 200 MG tablet Take 200 mg by mouth 2 (two) times daily as needed (for pain.).     Marland Kitchen Potassium 99 MG TABS Take 99 mg by mouth daily.     . vitamin B-12 (CYANOCOBALAMIN) 1000 MCG tablet Take 1,000 mcg by mouth daily.     Marland Kitchen gabapentin (NEURONTIN) 300 MG capsule Take 1 capsule (300 mg total) by mouth at bedtime. (Patient not taking: Reported on 07/30/2017) 30 capsule 0 Not Taking  . traMADol (ULTRAM) 50 MG tablet Take 1 tablet (50 mg total) by mouth every 6 (six) hours as needed. (Patient not taking: Reported on 08/13/2017) 60 tablet 0 Not Taking at Unknown time   Allergies  Allergen Reactions  . Codeine Anxiety    Social History  Substance Use Topics  . Smoking status: Former Smoker    Packs/day: 0.50    Years: 10.00    Types: Cigarettes  . Smokeless tobacco: Never Used     Comment: quit 1977  . Alcohol use  Yes     Comment: occ    No family history on file.   ROS ROS: I have reviewed the patient's review of systems thoroughly and there are no positive responses as relates to the HPI. Objective:  Physical Exam  Vital signs in last 24 hours:   There were no vitals filed for this visit.  Well-developed well-nourished patient in no acute distress. Alert and oriented x3 HEENT:within normal limits Cardiac: Regular rate and rhythm Pulmonary: Lungs clear to auscultation Abdomen: Soft and nontender.  Normal active bowel sounds  Musculoskeletal: (Left hip: Limited internal  rotation.  Painful internal rotation.  Limited range of motion.  Neurovascular intact distally. Labs: Recent Results (from the past 2160 hour(s))  Ferritin     Status: None   Collection Time: 07/19/17  9:42 AM  Result Value Ref Range   Ferritin 92 9 - 269 ng/ml  CBC & Diff and Retic     Status: Abnormal   Collection Time: 07/19/17  9:42 AM  Result Value Ref Range   WBC 2.4 (L) 3.9 - 10.3 10e3/uL   NEUT# 1.0 (L) 1.5 - 6.5 10e3/uL   HGB 11.3 (L) 11.6 - 15.9 g/dL   HCT 34.0 (L) 34.8 - 46.6 %   Platelets 143 (L) 145 - 400 10e3/uL   MCV 95.0 79.5 - 101.0 fL   MCH 31.6 25.1 - 34.0 pg   MCHC 33.2 31.5 - 36.0 g/dL   RBC 3.58 (L) 3.70 - 5.45 10e6/uL   RDW 13.6 11.2 - 14.5 %   lymph# 1.2 0.9 - 3.3 10e3/uL   MONO# 0.1 0.1 - 0.9 10e3/uL   Eosinophils Absolute 0.0 0.0 - 0.5 10e3/uL   Basophils Absolute 0.0 0.0 - 0.1 10e3/uL   NEUT% 42.8 38.4 - 76.8 %   LYMPH% 51.3 (H) 14.0 - 49.7 %   MONO% 5.1 0.0 - 14.0 %   EOS% 0.8 0.0 - 7.0 %   BASO% 0.0 0.0 - 2.0 %   Retic % 1.99 0.70 - 2.10 %   Retic Ct Abs 71.24 33.70 - 90.70 10e3/uL   Immature Retic Fract 11.60 (H) 1.60 - 10.00 %  Protime-INR     Status: Abnormal   Collection Time: 07/19/17  9:42 AM  Result Value Ref Range   Protime 10.8 10.6 - 13.4 Seconds   INR 0.90 (L) 2.00 - 3.50    Comment: INR is useful only to assess adequacy of anticoagulation with coumadin when comparing results from different labs. It should not be used to estimate bleeding risk or presence/abscense of coagulopathy in patients not on coumadin. Expected INR ranges for  nontherapeutic patients is 0.88 - 1.12.    Lovenox No   Lactate dehydrogenase     Status: None   Collection Time: 07/19/17  9:42 AM  Result Value Ref Range   LDH 184 125 - 245 U/L  TSH     Status: None   Collection Time: 07/19/17  9:42 AM  Result Value Ref Range   TSH 0.709 0.308 - 3.960 m(IU)/L  Vitamin B12     Status: None   Collection Time: 07/19/17  9:43 AM  Result Value Ref Range   Vitamin  B12 690 232 - 1,245 pg/mL  APTT     Status: None   Collection Time: 07/19/17  9:43 AM  Result Value Ref Range   aPTT 26 24 - 33 sec    Comment: This test has not been validated for monitoring unfractionated heparin therapy. aPTT-based therapeutic ranges for  unfractionated heparin therapy have not been established. For general guidelines on Heparin monitoring, refer to the [employer].   Thrombin time     Status: None   Collection Time: 07/19/17  9:43 AM  Result Value Ref Range   Thrombin Time 16.6 0.0 - 23.0 sec  Multiple Myeloma Panel (SPEP&IFE w/QIG)     Status: Abnormal   Collection Time: 07/19/17  9:43 AM  Result Value Ref Range   IgG, Qn, Serum 856 700 - 1,600 mg/dL   IgA, Qn, Serum 78 (L) 87 - 352 mg/dL   IgM, Qn, Serum 43 26 - 217 mg/dL   Total Protein 7.0 6.0 - 8.5 g/dL   Albumin SerPl Elph-Mcnc 3.9 2.9 - 4.4 g/dL   Alpha 1 0.3 0.0 - 0.4 g/dL   Alpha2 Glob SerPl Elph-Mcnc 0.6 0.4 - 1.0 g/dL   B-Globulin SerPl Elph-Mcnc 1.2 0.7 - 1.3 g/dL   Gamma Glob SerPl Elph-Mcnc 1.0 0.4 - 1.8 g/dL   M Protein SerPl Elph-Mcnc 0.3 (H) Not Observed g/dL   Globulin, Total 3.1 2.2 - 3.9 g/dL   Albumin/Glob SerPl 1.3 0.7 - 1.7   IFE 1 Comment     Comment: Immunofixation shows IgG monoclonal protein with lambda light chain specificity.    Please Note Comment     Comment: Protein electrophoresis scan will follow via computer, mail, or courier delivery.   Kappa/lambda light chains     Status: Abnormal   Collection Time: 07/19/17  9:43 AM  Result Value Ref Range   Ig Kappa Free Light Chain 316.9 (H) 3.3 - 19.4 mg/L   Ig Lambda Free Light Chain 14.2 5.7 - 26.3 mg/L   Kappa/Lambda FluidC Ratio 22.32 (H) 0.26 - 1.65  T3 uptake     Status: None   Collection Time: 07/19/17  9:43 AM  Result Value Ref Range   T3 Uptake Ratio 29 24 - 39 %   Free Thyroxine Index 1.8 1.2 - 4.9  T4     Status: None   Collection Time: 07/19/17  9:43 AM  Result Value Ref Range   Thyroxine  (T4) 6.3 4.5 - 12.0 ug/dL  T4, free     Status: None   Collection Time: 07/19/17  9:43 AM  Result Value Ref Range   T4,Free(Direct) 1.19 0.82 - 1.77 ng/dL  Comprehensive metabolic panel     Status: Abnormal   Collection Time: 07/19/17  9:44 AM  Result Value Ref Range   Sodium 140 136 - 145 mEq/L   Potassium 3.7 3.5 - 5.1 mEq/L   Chloride 106 98 - 109 mEq/L   CO2 25 22 - 29 mEq/L   Glucose 86 70 - 140 mg/dl    Comment: Glucose reference range is for nonfasting patients. Fasting glucose reference range is 70- 100.   BUN 14.8 7.0 - 26.0 mg/dL   Creatinine 0.8 0.6 - 1.1 mg/dL   Total Bilirubin 1.03 0.20 - 1.20 mg/dL   Alkaline Phosphatase 96 40 - 150 U/L   AST 36 (H) 5 - 34 U/L   ALT 42 0 - 55 U/L   Total Protein 7.4 6.4 - 8.3 g/dL   Albumin 4.3 3.5 - 5.0 g/dL   Calcium 9.9 8.4 - 10.4 mg/dL   Anion Gap 9 3 - 11 mEq/L   EGFR 81 (L) >90 ml/min/1.73 m2    Comment: eGFR is calculated using the CKD-EPI Creatinine Equation (2009)  CBC & Diff and Retic     Status: Abnormal  Collection Time: 07/30/17 11:31 AM  Result Value Ref Range   WBC 2.3 (L) 3.9 - 10.3 10e3/uL   NEUT# 0.9 (L) 1.5 - 6.5 10e3/uL   HGB 11.2 (L) 11.6 - 15.9 g/dL   HCT 34.4 (L) 34.8 - 46.6 %   Platelets 145 145 - 400 10e3/uL   MCV 95.8 79.5 - 101.0 fL   MCH 31.2 25.1 - 34.0 pg   MCHC 32.6 31.5 - 36.0 g/dL   RBC 3.59 (L) 3.70 - 5.45 10e6/uL   RDW 13.3 11.2 - 14.5 %   lymph# 1.3 0.9 - 3.3 10e3/uL   MONO# 0.1 0.1 - 0.9 10e3/uL   Eosinophils Absolute 0.0 0.0 - 0.5 10e3/uL   Basophils Absolute 0.0 0.0 - 0.1 10e3/uL   NEUT% 38.5 38.4 - 76.8 %   LYMPH% 56.2 (H) 14.0 - 49.7 %   MONO% 4.0 0.0 - 14.0 %   EOS% 0.9 0.0 - 7.0 %   BASO% 0.4 0.0 - 2.0 %   Retic % 1.15 0.70 - 2.10 %   Retic Ct Abs 41.29 33.70 - 90.70 10e3/uL   Immature Retic Fract 6.70 1.60 - 10.00 %  Ferritin     Status: None   Collection Time: 07/30/17 11:31 AM  Result Value Ref Range   Ferritin 81 9 - 269 ng/ml  Multiple Myeloma Panel (SPEP&IFE w/QIG)      Status: Abnormal   Collection Time: 07/30/17 11:31 AM  Result Value Ref Range   IgG, Qn, Serum 847 700 - 1,600 mg/dL   IgA, Qn, Serum 80 (L) 87 - 352 mg/dL   IgM, Qn, Serum 43 26 - 217 mg/dL   Total Protein 6.8 6.0 - 8.5 g/dL   Albumin SerPl Elph-Mcnc 3.7 2.9 - 4.4 g/dL   Alpha 1 0.2 0.0 - 0.4 g/dL   Alpha2 Glob SerPl Elph-Mcnc 0.7 0.4 - 1.0 g/dL   B-Globulin SerPl Elph-Mcnc 1.1 0.7 - 1.3 g/dL   Gamma Glob SerPl Elph-Mcnc 1.0 0.4 - 1.8 g/dL   M Protein SerPl Elph-Mcnc 0.2 (H) Not Observed g/dL   Globulin, Total 3.1 2.2 - 3.9 g/dL   Albumin/Glob SerPl 1.2 0.7 - 1.7   IFE 1 Comment     Comment: Immunofixation shows IgG monoclonal protein with lambda light chain specificity.    Please Note Comment     Comment: Protein electrophoresis scan will follow via computer, mail, or courier delivery.   Vitamin B12     Status: None   Collection Time: 07/30/17 11:31 AM  Result Value Ref Range   Vitamin B12 594 232 - 1,245 pg/mL  Comprehensive metabolic panel     Status: None   Collection Time: 07/30/17 11:31 AM  Result Value Ref Range   Sodium 139 136 - 145 mEq/L   Potassium 3.6 3.5 - 5.1 mEq/L   Chloride 108 98 - 109 mEq/L   CO2 23 22 - 29 mEq/L   Glucose 99 70 - 140 mg/dl    Comment: Glucose reference range is for nonfasting patients. Fasting glucose reference range is 70- 100.   BUN 15.2 7.0 - 26.0 mg/dL   Creatinine 0.8 0.6 - 1.1 mg/dL   Total Bilirubin 0.88 0.20 - 1.20 mg/dL   Alkaline Phosphatase 99 40 - 150 U/L   AST 18 5 - 34 U/L   ALT 20 0 - 55 U/L   Total Protein 7.2 6.4 - 8.3 g/dL   Albumin 4.0 3.5 - 5.0 g/dL   Calcium 9.7 8.4 - 10.4 mg/dL  Anion Gap 8 3 - 11 mEq/L   EGFR >60 >60 ml/min/1.73 m2    Comment: eGFR is calculated using the CKD-EPI Creatinine Equation (2009)  Surgical pcr screen     Status: None   Collection Time: 08/14/17  3:00 PM  Result Value Ref Range   MRSA, PCR NEGATIVE NEGATIVE   Staphylococcus aureus NEGATIVE NEGATIVE    Comment: (NOTE) The  Xpert SA Assay (FDA approved for NASAL specimens in patients 22 years of age and older), is one component of a comprehensive surveillance program. It is not intended to diagnose infection nor to guide or monitor treatment.   CBC     Status: Abnormal   Collection Time: 08/14/17  3:34 PM  Result Value Ref Range   WBC 3.1 (L) 4.0 - 10.5 K/uL   RBC 3.48 (L) 3.87 - 5.11 MIL/uL   Hemoglobin 10.9 (L) 12.0 - 15.0 g/dL   HCT 32.8 (L) 36.0 - 46.0 %   MCV 94.3 78.0 - 100.0 fL   MCH 31.3 26.0 - 34.0 pg   MCHC 33.2 30.0 - 36.0 g/dL   RDW 13.1 11.5 - 15.5 %   Platelets 135 (L) 150 - 400 K/uL  Basic metabolic panel     Status: Abnormal   Collection Time: 08/14/17  3:34 PM  Result Value Ref Range   Sodium 142 135 - 145 mmol/L   Potassium 4.4 3.5 - 5.1 mmol/L   Chloride 111 101 - 111 mmol/L   CO2 24 22 - 32 mmol/L   Glucose, Bld 89 65 - 99 mg/dL   BUN 21 (H) 6 - 20 mg/dL   Creatinine, Ser 0.74 0.44 - 1.00 mg/dL   Calcium 9.5 8.9 - 10.3 mg/dL   GFR calc non Af Amer >60 >60 mL/min   GFR calc Af Amer >60 >60 mL/min    Comment: (NOTE) The eGFR has been calculated using the CKD EPI equation. This calculation has not been validated in all clinical situations. eGFR's persistently <60 mL/min signify possible Chronic Kidney Disease.    Anion gap 7 5 - 15  Type and screen All Cardiac and thoracic surgeries, spinal fusions, myomectomies, craniotomies, colon & liver resections, total joint revisions, same day c-section with placenta previa or accreta.     Status: None   Collection Time: 08/14/17  3:34 PM  Result Value Ref Range   ABO/RH(D) B POS    Antibody Screen NEG    Sample Expiration 08/28/2017    Extend sample reason NO TRANSFUSIONS OR PREGNANCY IN THE PAST 3 MONTHS   ABO/Rh     Status: None   Collection Time: 08/14/17  3:34 PM  Result Value Ref Range   ABO/RH(D) B POS     Estimated body mass index is 36.09 kg/m as calculated from the following:   Height as of 08/14/17: 5' 1"  (1.549  m).   Weight as of 08/14/17: 86.6 kg (191 lb).   Imaging Review Plain radiographs demonstrate severe degenerative joint disease of the left hip(s). The bone quality appears to be fair for age and reported activity level.  Assessment/Plan:  End stage arthritis, left hip(s)  The patient history, physical examination, clinical judgement of the provider and imaging studies are consistent with end stage degenerative joint disease of the left hip(s) and total hip arthroplasty is deemed medically necessary. The treatment options including medical management, injection therapy, arthroscopy and arthroplasty were discussed at length. The risks and benefits of total hip arthroplasty were presented and reviewed. The risks due  to aseptic loosening, infection, stiffness, dislocation/subluxation,  thromboembolic complications and other imponderables were discussed.  The patient acknowledged the explanation, agreed to proceed with the plan and consent was signed. Patient is being admitted for inpatient treatment for surgery, pain control, PT, OT, prophylactic antibiotics, VTE prophylaxis, progressive ambulation and ADL's and discharge planning.The patient is planning to be discharged home with home health services   No interval history change.

## 2017-08-16 ENCOUNTER — Encounter (HOSPITAL_COMMUNITY): Payer: Self-pay | Admitting: *Deleted

## 2017-08-16 ENCOUNTER — Inpatient Hospital Stay (HOSPITAL_COMMUNITY): Payer: Medicare Other

## 2017-08-16 ENCOUNTER — Inpatient Hospital Stay (HOSPITAL_COMMUNITY): Payer: Medicare Other | Admitting: Certified Registered Nurse Anesthetist

## 2017-08-16 ENCOUNTER — Inpatient Hospital Stay (HOSPITAL_COMMUNITY)
Admission: RE | Admit: 2017-08-16 | Discharge: 2017-08-18 | DRG: 470 | Disposition: A | Discharge status: Home Health | Payer: Medicare Other | Source: Ambulatory Visit | Attending: Orthopedic Surgery | Admitting: Orthopedic Surgery

## 2017-08-16 ENCOUNTER — Encounter (HOSPITAL_COMMUNITY)
Admission: RE | Disposition: A | Discharge status: Home Health | Payer: Self-pay | Source: Ambulatory Visit | Attending: Orthopedic Surgery

## 2017-08-16 DIAGNOSIS — Z87891 Personal history of nicotine dependence: Secondary | ICD-10-CM

## 2017-08-16 DIAGNOSIS — Z96642 Presence of left artificial hip joint: Secondary | ICD-10-CM | POA: Diagnosis not present

## 2017-08-16 DIAGNOSIS — Z471 Aftercare following joint replacement surgery: Secondary | ICD-10-CM | POA: Diagnosis not present

## 2017-08-16 DIAGNOSIS — C9000 Multiple myeloma not having achieved remission: Secondary | ICD-10-CM | POA: Diagnosis not present

## 2017-08-16 DIAGNOSIS — Z8639 Personal history of other endocrine, nutritional and metabolic disease: Secondary | ICD-10-CM

## 2017-08-16 DIAGNOSIS — E039 Hypothyroidism, unspecified: Secondary | ICD-10-CM | POA: Diagnosis not present

## 2017-08-16 DIAGNOSIS — Z96653 Presence of artificial knee joint, bilateral: Secondary | ICD-10-CM | POA: Diagnosis present

## 2017-08-16 DIAGNOSIS — M1612 Unilateral primary osteoarthritis, left hip: Principal | ICD-10-CM | POA: Diagnosis present

## 2017-08-16 DIAGNOSIS — M545 Low back pain: Secondary | ICD-10-CM | POA: Diagnosis present

## 2017-08-16 DIAGNOSIS — Z79899 Other long term (current) drug therapy: Secondary | ICD-10-CM

## 2017-08-16 DIAGNOSIS — M47816 Spondylosis without myelopathy or radiculopathy, lumbar region: Secondary | ICD-10-CM | POA: Diagnosis not present

## 2017-08-16 DIAGNOSIS — Z885 Allergy status to narcotic agent status: Secondary | ICD-10-CM

## 2017-08-16 DIAGNOSIS — Z9884 Bariatric surgery status: Secondary | ICD-10-CM | POA: Diagnosis not present

## 2017-08-16 DIAGNOSIS — Z419 Encounter for procedure for purposes other than remedying health state, unspecified: Secondary | ICD-10-CM

## 2017-08-16 DIAGNOSIS — D649 Anemia, unspecified: Secondary | ICD-10-CM | POA: Diagnosis not present

## 2017-08-16 HISTORY — PX: TOTAL HIP ARTHROPLASTY: SHX124

## 2017-08-16 LAB — TYPE AND SCREEN
ABO/RH(D): B POS
Antibody Screen: NEGATIVE

## 2017-08-16 LAB — URINALYSIS, ROUTINE W REFLEX MICROSCOPIC
BILIRUBIN URINE: NEGATIVE
GLUCOSE, UA: NEGATIVE mg/dL
Ketones, ur: 20 mg/dL — AB
LEUKOCYTES UA: NEGATIVE
NITRITE: NEGATIVE
PROTEIN: NEGATIVE mg/dL
Specific Gravity, Urine: 1.025 (ref 1.005–1.030)
pH: 5 (ref 5.0–8.0)

## 2017-08-16 SURGERY — ARTHROPLASTY, HIP, TOTAL, ANTERIOR APPROACH
Anesthesia: Spinal | Site: Hip | Laterality: Left

## 2017-08-16 MED ORDER — BISACODYL 5 MG PO TBEC: 5.0000 mg | DELAYED_RELEASE_TABLET | Freq: Every day | ORAL | Status: DC | PRN

## 2017-08-16 MED ORDER — PHENYLEPHRINE HCL 10 MG/ML IJ SOLN
INTRAVENOUS | Status: DC | PRN
  Administered 2017-08-16: 50 ug/min via INTRAVENOUS

## 2017-08-16 MED ORDER — SODIUM CHLORIDE 0.9 % IV SOLN
INTRAVENOUS | Status: AC | PRN
  Administered 2017-08-16: 2000 mg via TOPICAL

## 2017-08-16 MED ORDER — ALUM & MAG HYDROXIDE-SIMETH 200-200-20 MG/5ML PO SUSP: 30.0000 mL | ORAL | Status: DC | PRN

## 2017-08-16 MED ORDER — GABAPENTIN 300 MG PO CAPS
300.0000 mg | ORAL_CAPSULE | Freq: Two times a day (BID) | ORAL | Status: DC
  Administered 2017-08-16 – 2017-08-18 (×4): 300 mg via ORAL
  Filled 2017-08-16 (×4): qty 1

## 2017-08-16 MED ORDER — HYDROMORPHONE HCL 1 MG/ML IJ SOLN
INTRAMUSCULAR | Status: AC
  Administered 2017-08-16: 0.5 mg via INTRAVENOUS
  Filled 2017-08-16: qty 1

## 2017-08-16 MED ORDER — FENTANYL CITRATE (PF) 100 MCG/2ML IJ SOLN
INTRAMUSCULAR | Status: AC
  Filled 2017-08-16: qty 2

## 2017-08-16 MED ORDER — TRANEXAMIC ACID 1000 MG/10ML IV SOLN
1000.0000 mg | Freq: Once | INTRAVENOUS | Status: AC
  Administered 2017-08-16: 17:00:00 1000 mg via INTRAVENOUS
  Filled 2017-08-16: qty 1100

## 2017-08-16 MED ORDER — ASPIRIN EC 325 MG PO TBEC: 325.0000 mg | DELAYED_RELEASE_TABLET | Freq: Two times a day (BID) | ORAL | 0 refills | Status: DC

## 2017-08-16 MED ORDER — MIDAZOLAM HCL 2 MG/2ML IJ SOLN
INTRAMUSCULAR | Status: AC
  Filled 2017-08-16: qty 2

## 2017-08-16 MED ORDER — CELECOXIB 200 MG PO CAPS
200.0000 mg | ORAL_CAPSULE | Freq: Two times a day (BID) | ORAL | Status: DC
  Administered 2017-08-16 – 2017-08-18 (×4): 200 mg via ORAL
  Filled 2017-08-16 (×4): qty 1

## 2017-08-16 MED ORDER — MIDAZOLAM HCL 5 MG/5ML IJ SOLN
INTRAMUSCULAR | Status: DC | PRN
  Administered 2017-08-16 (×2): 1 mg via INTRAVENOUS

## 2017-08-16 MED ORDER — DEXAMETHASONE SODIUM PHOSPHATE 10 MG/ML IJ SOLN
INTRAMUSCULAR | Status: AC
  Filled 2017-08-16: qty 1

## 2017-08-16 MED ORDER — CEFAZOLIN SODIUM-DEXTROSE 2-4 GM/100ML-% IV SOLN
2.0000 g | Freq: Four times a day (QID) | INTRAVENOUS | Status: AC
  Administered 2017-08-16 (×2): 2 g via INTRAVENOUS
  Filled 2017-08-16 (×2): qty 100

## 2017-08-16 MED ORDER — OXYCODONE HCL 5 MG PO TABS
5.0000 mg | ORAL_TABLET | ORAL | Status: DC | PRN
  Administered 2017-08-16 – 2017-08-17 (×7): 10 mg via ORAL
  Administered 2017-08-18 (×2): 5 mg via ORAL
  Filled 2017-08-16 (×4): qty 2
  Filled 2017-08-16: qty 1
  Filled 2017-08-16 (×4): qty 2

## 2017-08-16 MED ORDER — DIPHENHYDRAMINE HCL 12.5 MG/5ML PO ELIX: 12.5000 mg | ORAL_SOLUTION | ORAL | Status: DC | PRN

## 2017-08-16 MED ORDER — PROMETHAZINE HCL 25 MG/ML IJ SOLN: 6.2500 mg | INTRAMUSCULAR | Status: DC | PRN

## 2017-08-16 MED ORDER — FENTANYL CITRATE (PF) 100 MCG/2ML IJ SOLN
50.0000 ug | INTRAMUSCULAR | Status: DC | PRN
  Administered 2017-08-16 (×2): 50 ug via INTRAVENOUS

## 2017-08-16 MED ORDER — PHENYLEPHRINE 40 MCG/ML (10ML) SYRINGE FOR IV PUSH (FOR BLOOD PRESSURE SUPPORT)
PREFILLED_SYRINGE | INTRAVENOUS | Status: AC
  Filled 2017-08-16: qty 10

## 2017-08-16 MED ORDER — DEXAMETHASONE SODIUM PHOSPHATE 10 MG/ML IJ SOLN
INTRAMUSCULAR | Status: DC | PRN
  Administered 2017-08-16: 10 mg via INTRAVENOUS

## 2017-08-16 MED ORDER — PROPOFOL 10 MG/ML IV BOLUS
INTRAVENOUS | Status: AC
  Filled 2017-08-16: qty 20

## 2017-08-16 MED ORDER — PROPOFOL 10 MG/ML IV BOLUS
INTRAVENOUS | Status: DC | PRN
  Administered 2017-08-16 (×4): 10 mg via INTRAVENOUS
  Administered 2017-08-16: 30 mg via INTRAVENOUS
  Administered 2017-08-16: 10 mg via INTRAVENOUS

## 2017-08-16 MED ORDER — ONDANSETRON HCL 4 MG/2ML IJ SOLN
INTRAMUSCULAR | Status: AC
  Filled 2017-08-16: qty 2

## 2017-08-16 MED ORDER — BUPIVACAINE HCL (PF) 0.25 % IJ SOLN
INTRAMUSCULAR | Status: DC | PRN
  Administered 2017-08-16: 30 mL

## 2017-08-16 MED ORDER — LIDOCAINE 2% (20 MG/ML) 5 ML SYRINGE
INTRAMUSCULAR | Status: AC
  Filled 2017-08-16: qty 5

## 2017-08-16 MED ORDER — TRANEXAMIC ACID 1000 MG/10ML IV SOLN
1000.0000 mg | INTRAVENOUS | Status: AC
  Administered 2017-08-16: 1000 mg via INTRAVENOUS
  Filled 2017-08-16: qty 1100

## 2017-08-16 MED ORDER — DEXTROSE 5 % IV SOLN
500.0000 mg | Freq: Four times a day (QID) | INTRAVENOUS | Status: DC | PRN
  Administered 2017-08-16 – 2017-08-17 (×2): 500 mg via INTRAVENOUS
  Filled 2017-08-16 (×2): qty 550

## 2017-08-16 MED ORDER — STERILE WATER FOR IRRIGATION IR SOLN
Status: DC | PRN
  Administered 2017-08-16: 2000 mL

## 2017-08-16 MED ORDER — DOCUSATE SODIUM 100 MG PO CAPS
100.0000 mg | ORAL_CAPSULE | Freq: Two times a day (BID) | ORAL | Status: DC
  Administered 2017-08-16 – 2017-08-18 (×4): 100 mg via ORAL
  Filled 2017-08-16 (×4): qty 1

## 2017-08-16 MED ORDER — CHLORHEXIDINE GLUCONATE 4 % EX LIQD: 60.0000 mL | Freq: Once | CUTANEOUS | Status: DC

## 2017-08-16 MED ORDER — ACETAMINOPHEN 650 MG RE SUPP: 650.0000 mg | RECTAL | Status: DC | PRN

## 2017-08-16 MED ORDER — ACETAMINOPHEN 325 MG PO TABS
650.0000 mg | ORAL_TABLET | ORAL | Status: DC | PRN
  Administered 2017-08-16 – 2017-08-17 (×2): 650 mg via ORAL
  Filled 2017-08-16 (×2): qty 2

## 2017-08-16 MED ORDER — BUPIVACAINE HCL (PF) 0.25 % IJ SOLN
INTRAMUSCULAR | Status: AC
  Filled 2017-08-16: qty 30

## 2017-08-16 MED ORDER — ONDANSETRON HCL 4 MG PO TABS: 4.0000 mg | ORAL_TABLET | Freq: Four times a day (QID) | ORAL | Status: DC | PRN

## 2017-08-16 MED ORDER — SODIUM CHLORIDE 0.9 % IR SOLN
Status: DC | PRN
  Administered 2017-08-16: 1000 mL

## 2017-08-16 MED ORDER — MAGNESIUM CITRATE PO SOLN: 1.0000 | Freq: Once | ORAL | Status: DC | PRN

## 2017-08-16 MED ORDER — DOCUSATE SODIUM 100 MG PO CAPS: 100.0000 mg | ORAL_CAPSULE | Freq: Two times a day (BID) | ORAL | 0 refills | Status: DC

## 2017-08-16 MED ORDER — POLYETHYLENE GLYCOL 3350 17 G PO PACK: 17.0000 g | PACK | Freq: Every day | ORAL | Status: DC | PRN

## 2017-08-16 MED ORDER — HYDROMORPHONE HCL 1 MG/ML IJ SOLN
0.5000 mg | INTRAMUSCULAR | Status: DC | PRN
  Administered 2017-08-16 (×2): 0.5 mg via INTRAVENOUS
  Administered 2017-08-17: 1 mg via INTRAVENOUS
  Filled 2017-08-16 (×4): qty 1

## 2017-08-16 MED ORDER — TIZANIDINE HCL 2 MG PO TABS: 2.0000 mg | ORAL_TABLET | Freq: Three times a day (TID) | ORAL | 0 refills | Status: DC | PRN

## 2017-08-16 MED ORDER — HYDROMORPHONE HCL 1 MG/ML IJ SOLN
0.2500 mg | INTRAMUSCULAR | Status: DC | PRN
  Administered 2017-08-16 (×3): 0.5 mg via INTRAVENOUS

## 2017-08-16 MED ORDER — PHENYLEPHRINE HCL 10 MG/ML IJ SOLN
INTRAMUSCULAR | Status: AC
  Filled 2017-08-16: qty 1

## 2017-08-16 MED ORDER — ASPIRIN EC 325 MG PO TBEC
325.0000 mg | DELAYED_RELEASE_TABLET | Freq: Two times a day (BID) | ORAL | Status: DC
  Administered 2017-08-17 – 2017-08-18 (×3): 325 mg via ORAL
  Filled 2017-08-16 (×3): qty 1

## 2017-08-16 MED ORDER — BUPIVACAINE IN DEXTROSE 0.75-8.25 % IT SOLN
INTRATHECAL | Status: DC | PRN
  Administered 2017-08-16: 2 mL via INTRATHECAL

## 2017-08-16 MED ORDER — METHOCARBAMOL 500 MG PO TABS
500.0000 mg | ORAL_TABLET | Freq: Four times a day (QID) | ORAL | Status: DC | PRN
  Administered 2017-08-16 – 2017-08-18 (×4): 500 mg via ORAL
  Filled 2017-08-16 (×4): qty 1

## 2017-08-16 MED ORDER — PROPOFOL 10 MG/ML IV BOLUS
INTRAVENOUS | Status: AC
  Filled 2017-08-16: qty 40

## 2017-08-16 MED ORDER — LACTATED RINGERS IV SOLN
INTRAVENOUS | Status: DC
  Administered 2017-08-16 (×2): via INTRAVENOUS

## 2017-08-16 MED ORDER — SODIUM CHLORIDE 0.9 % IV SOLN
INTRAVENOUS | Status: DC
  Administered 2017-08-16 (×2): via INTRAVENOUS

## 2017-08-16 MED ORDER — CEFAZOLIN SODIUM-DEXTROSE 2-4 GM/100ML-% IV SOLN
2.0000 g | INTRAVENOUS | Status: AC
  Administered 2017-08-16: 2 g via INTRAVENOUS
  Filled 2017-08-16: qty 100

## 2017-08-16 MED ORDER — TRANEXAMIC ACID 1000 MG/10ML IV SOLN
2000.0000 mg | INTRAVENOUS | Status: DC
  Filled 2017-08-16: qty 20

## 2017-08-16 MED ORDER — PROPOFOL 500 MG/50ML IV EMUL
INTRAVENOUS | Status: DC | PRN
  Administered 2017-08-16: 50 ug/kg/min via INTRAVENOUS

## 2017-08-16 MED ORDER — PHENYLEPHRINE HCL 10 MG/ML IJ SOLN
INTRAMUSCULAR | Status: DC | PRN
  Administered 2017-08-16 (×3): 80 ug via INTRAVENOUS
  Administered 2017-08-16: 40 ug via INTRAVENOUS
  Administered 2017-08-16: 120 ug via INTRAVENOUS
  Administered 2017-08-16: 40 ug via INTRAVENOUS
  Administered 2017-08-16: 120 ug via INTRAVENOUS

## 2017-08-16 MED ORDER — OXYCODONE-ACETAMINOPHEN 5-325 MG PO TABS: 1.0000 | ORAL_TABLET | ORAL | 0 refills | Status: DC | PRN

## 2017-08-16 MED ORDER — DEXAMETHASONE SODIUM PHOSPHATE 10 MG/ML IJ SOLN
10.0000 mg | Freq: Two times a day (BID) | INTRAMUSCULAR | Status: AC
  Administered 2017-08-16 – 2017-08-17 (×3): 10 mg via INTRAVENOUS
  Filled 2017-08-16 (×3): qty 1

## 2017-08-16 MED ORDER — ONDANSETRON HCL 4 MG/2ML IJ SOLN
4.0000 mg | Freq: Four times a day (QID) | INTRAMUSCULAR | Status: DC | PRN
  Administered 2017-08-16: 4 mg via INTRAVENOUS
  Filled 2017-08-16: qty 2

## 2017-08-16 MED ORDER — LIDOCAINE HCL (CARDIAC) 20 MG/ML IV SOLN
INTRAVENOUS | Status: DC | PRN
  Administered 2017-08-16: 50 mg via INTRAVENOUS

## 2017-08-16 SURGICAL SUPPLY — 39 items
BAG ZIPLOCK 12X15 (MISCELLANEOUS) ×3 IMPLANT
BENZOIN TINCTURE PRP APPL 2/3 (GAUZE/BANDAGES/DRESSINGS) ×3 IMPLANT
BLADE SAW SGTL 18X1.27X75 (BLADE) ×2 IMPLANT
BLADE SAW SGTL 18X1.27X75MM (BLADE) ×1
CAPT HIP TOTAL 2 ×3 IMPLANT
CELLS DAT CNTRL 66122 CELL SVR (MISCELLANEOUS) ×1 IMPLANT
CLOSURE WOUND 1/2 X4 (GAUZE/BANDAGES/DRESSINGS) ×1
COVER PERINEAL POST (MISCELLANEOUS) ×3 IMPLANT
COVER SURGICAL LIGHT HANDLE (MISCELLANEOUS) ×3 IMPLANT
DRAPE STERI IOBAN 125X83 (DRAPES) ×3 IMPLANT
DRAPE U-SHAPE 47X51 STRL (DRAPES) ×6 IMPLANT
DRESSING AQUACEL AG SP 3.5X6 (GAUZE/BANDAGES/DRESSINGS) ×1 IMPLANT
DRSG AQUACEL AG SP 3.5X6 (GAUZE/BANDAGES/DRESSINGS) ×3
DURAPREP 26ML APPLICATOR (WOUND CARE) ×3 IMPLANT
ELECT REM PT RETURN 15FT ADLT (MISCELLANEOUS) ×3 IMPLANT
GLOVE BIOGEL PI IND STRL 7.0 (GLOVE) ×3 IMPLANT
GLOVE BIOGEL PI IND STRL 7.5 (GLOVE) ×2 IMPLANT
GLOVE BIOGEL PI IND STRL 8 (GLOVE) ×2 IMPLANT
GLOVE BIOGEL PI INDICATOR 7.0 (GLOVE) ×6
GLOVE BIOGEL PI INDICATOR 7.5 (GLOVE) ×4
GLOVE BIOGEL PI INDICATOR 8 (GLOVE) ×4
GLOVE ECLIPSE 6.5 STRL STRAW (GLOVE) ×3 IMPLANT
GLOVE ECLIPSE 7.5 STRL STRAW (GLOVE) ×6 IMPLANT
GLOVE INDICATOR 6.5 STRL GRN (GLOVE) ×3 IMPLANT
GOWN STRL REUS W/TWL XL LVL3 (GOWN DISPOSABLE) ×12 IMPLANT
HOLDER FOLEY CATH W/STRAP (MISCELLANEOUS) ×3 IMPLANT
HOOD PEEL AWAY FLYTE STAYCOOL (MISCELLANEOUS) ×6 IMPLANT
PACK ANTERIOR HIP CUSTOM (KITS) ×3 IMPLANT
RTRCTR WOUND ALEXIS 18CM MED (MISCELLANEOUS) ×3
STAPLER VISISTAT 35W (STAPLE) IMPLANT
STRIP CLOSURE SKIN 1/2X4 (GAUZE/BANDAGES/DRESSINGS) ×2 IMPLANT
SUT ETHIBOND NAB CT1 #1 30IN (SUTURE) ×6 IMPLANT
SUT MNCRL AB 3-0 PS2 18 (SUTURE) IMPLANT
SUT VIC AB 0 CT1 36 (SUTURE) ×3 IMPLANT
SUT VIC AB 1 CT1 36 (SUTURE) ×6 IMPLANT
SUT VIC AB 2-0 CT1 27 (SUTURE) ×4
SUT VIC AB 2-0 CT1 TAPERPNT 27 (SUTURE) ×2 IMPLANT
TRAY FOLEY CATH SILVER 14FR (SET/KITS/TRAYS/PACK) ×3 IMPLANT
YANKAUER SUCT BULB TIP NO VENT (SUCTIONS) ×3 IMPLANT

## 2017-08-16 NOTE — Anesthesia Postprocedure Evaluation (Signed)
Anesthesia Post Note  Patient: Madison Parker  Procedure(s) Performed: LEFT TOTAL HIP ARTHROPLASTY ANTERIOR APPROACH (Left Hip)     Patient location during evaluation: PACU Anesthesia Type: Spinal Level of consciousness: oriented and awake and alert Pain management: pain level controlled Vital Signs Assessment: post-procedure vital signs reviewed and stable Respiratory status: spontaneous breathing, respiratory function stable and patient connected to nasal cannula oxygen Cardiovascular status: blood pressure returned to baseline and stable Postop Assessment: no headache, no backache and no apparent nausea or vomiting Anesthetic complications: no    Last Vitals:  Vitals:   08/16/17 1330 08/16/17 1345  BP: 100/65 104/68  Pulse: 70 64  Resp: 19 16  Temp:    SpO2: 98% 100%    Last Pain:  Vitals:   08/16/17 1323  PainSc: 0-No pain    LLE Motor Response: Purposeful movement (08/16/17 1345) LLE Sensation: Numbness (08/16/17 1345) RLE Motor Response: Purposeful movement (08/16/17 1345) RLE Sensation: Numbness (08/16/17 1345) L Sensory Level: L1-Inguinal (groin) region (08/16/17 1345) R Sensory Level: L2-Upper inner thigh, upper buttock (08/16/17 1345)  Felice Deem S

## 2017-08-16 NOTE — Transfer of Care (Signed)
Immediate Anesthesia Transfer of Care Note  Patient: Madison Parker  Procedure(s) Performed: Procedure(s): LEFT TOTAL HIP ARTHROPLASTY ANTERIOR APPROACH (Left)  Patient Location: PACU  Anesthesia Type:MAC and Spinal  Level of Consciousness: Patient easily awoken, sedated, comfortable, cooperative, following commands, responds to stimulation.   Airway & Oxygen Therapy: Patient spontaneously breathing, ventilating well, oxygen via simple oxygen mask.  Post-op Assessment: Report given to PACU RN, vital signs reviewed and stable.   Post vital signs: Reviewed and stable.  Complications: No apparent anesthesia complications  Last Vitals: There were no vitals filed for this visit.  Last Pain:  Vitals:   08/16/17 1100  PainSc: 5       Patients Stated Pain Goal: 4 (34/35/68 6168)  Complications: No apparent anesthesia complications

## 2017-08-16 NOTE — Anesthesia Procedure Notes (Signed)
Spinal  Patient location during procedure: OR Start time: 08/16/2017 11:25 AM End time: 08/16/2017 11:32 AM Staffing Anesthesiologist: Luciano Cinquemani, Iona Beard Performed: anesthesiologist  Preanesthetic Checklist Completed: patient identified, site marked, surgical consent, pre-op evaluation, timeout performed, IV checked, risks and benefits discussed and monitors and equipment checked Spinal Block Patient position: sitting Prep: Betadine Patient monitoring: heart rate, continuous pulse ox and blood pressure Location: L3-4 Injection technique: single-shot Needle Needle type: Spinocan  Needle gauge: 22 G Needle length: 9 cm Additional Notes Expiration date of kit checked and confirmed. Patient tolerated procedure well, without complications.

## 2017-08-16 NOTE — Discharge Instructions (Signed)

## 2017-08-16 NOTE — Anesthesia Preprocedure Evaluation (Signed)
Anesthesia Evaluation  Patient identified by MRN, date of birth, ID band Patient awake    Reviewed: Allergy & Precautions, NPO status , Patient's Chart, lab work & pertinent test results  Airway Mallampati: II  TM Distance: >3 FB Neck ROM: Full    Dental no notable dental hx.    Pulmonary neg pulmonary ROS, former smoker,    Pulmonary exam normal breath sounds clear to auscultation       Cardiovascular negative cardio ROS Normal cardiovascular exam Rhythm:Regular Rate:Normal     Neuro/Psych negative neurological ROS  negative psych ROS   GI/Hepatic negative GI ROS, Neg liver ROS,   Endo/Other  Hypothyroidism   Renal/GU negative Renal ROS  negative genitourinary   Musculoskeletal negative musculoskeletal ROS (+)   Abdominal   Peds negative pediatric ROS (+)  Hematology  (+) anemia ,   Anesthesia Other Findings   Reproductive/Obstetrics negative OB ROS                             Anesthesia Physical Anesthesia Plan  ASA: II  Anesthesia Plan: Spinal   Post-op Pain Management:    Induction: Intravenous  PONV Risk Score and Plan: 1 and Ondansetron and Dexamethasone  Airway Management Planned: Simple Face Mask  Additional Equipment:   Intra-op Plan:   Post-operative Plan:   Informed Consent: I have reviewed the patients History and Physical, chart, labs and discussed the procedure including the risks, benefits and alternatives for the proposed anesthesia with the patient or authorized representative who has indicated his/her understanding and acceptance.   Dental advisory given  Plan Discussed with: CRNA and Surgeon  Anesthesia Plan Comments:         Anesthesia Quick Evaluation

## 2017-08-17 LAB — CBC
HEMATOCRIT: 28.9 % — AB (ref 36.0–46.0)
HEMOGLOBIN: 9.8 g/dL — AB (ref 12.0–15.0)
MCH: 31.5 pg (ref 26.0–34.0)
MCHC: 33.9 g/dL (ref 30.0–36.0)
MCV: 92.9 fL (ref 78.0–100.0)
Platelets: 114 10*3/uL — ABNORMAL LOW (ref 150–400)
RBC: 3.11 MIL/uL — ABNORMAL LOW (ref 3.87–5.11)
RDW: 12.6 % (ref 11.5–15.5)
WBC: 5.5 10*3/uL (ref 4.0–10.5)

## 2017-08-17 NOTE — Progress Notes (Signed)
   08/17/17 1400  PT Visit Information  Last PT Received On 08/17/17  History of Present Illness s/p L DA THA  Pt is progressing very well, with some d/o pain but able to work with therapy; ready for D/C from PT standpoint; practiced up/down one step with husband present; reviewed HEP; all questions answered  Subjective Data  Patient Stated Goal home soon, less pain in hip with activity  Precautions  Precautions Fall  Restrictions  Weight Bearing Restrictions No  Other Position/Activity Restrictions WBAT  Pain Assessment  Pain Assessment 0-10  Pain Score 6  Pain Location L hip  Pain Descriptors / Indicators Sore;Throbbing  Pain Intervention(s) Monitored during session;Premedicated before session  Cognition  Arousal/Alertness Awake/alert  Behavior During Therapy WFL for tasks assessed/performed  Overall Cognitive Status Within Functional Limits for tasks assessed  Bed Mobility  Overal bed mobility Needs Assistance  Bed Mobility Supine to Sit  Supine to sit Min guard  General bed mobility comments facilitation LLE--husband assisting as needed  Transfers  Overall transfer level Needs assistance  Equipment used Rolling walker (2 wheeled)  Transfers Sit to/from Stand  Sit to Stand Min guard;Supervision  General transfer comment cues for hand placement  Ambulation/Gait  Ambulation/Gait assistance Min guard;Supervision  Ambulation Distance (Feet) 85 Feet (10' more)  Assistive device Rolling walker (2 wheeled)  Gait Pattern/deviations Step-through pattern;Step-to pattern;Antalgic  General Gait Details cues for sequence, RW position and progression to step through  Stairs Yes  Stairs assistance Min guard  Stair Management No rails;Step to pattern;Forwards;With walker  Number of Stairs 1  General stair comments cues for sequence adn safe technique  Total Joint Exercises  Long Arc Quad AROM;Left;10 reps;Seated  PT - End of Session  Equipment Utilized During Treatment Gait belt   Activity Tolerance Patient tolerated treatment well  Patient left in bed;with call bell/phone within reach;with family/visitor present  PT - Assessment/Plan  PT Plan Current plan remains appropriate  PT Visit Diagnosis Difficulty in walking, not elsewhere classified (R26.2);Pain  Pain - Right/Left Left  Pain - part of body Hip  PT Frequency (ACUTE ONLY) 7X/week  Follow Up Recommendations DC plan and follow up therapy as arranged by surgeon  PT equipment None recommended by PT  AM-PAC PT "6 Clicks" Daily Activity Outcome Measure  Difficulty turning over in bed (including adjusting bedclothes, sheets and blankets)? 1  Difficulty moving from lying on back to sitting on the side of the bed?  1  Difficulty sitting down on and standing up from a chair with arms (e.g., wheelchair, bedside commode, etc,.)? 1  Help needed moving to and from a bed to chair (including a wheelchair)? 4  Help needed walking in hospital room? 3  Help needed climbing 3-5 steps with a railing?  3  6 Click Score 13  Mobility G Code  CK  PT Goal Progression  Progress towards PT goals Progressing toward goals  Acute Rehab PT Goals  PT Goal Formulation With patient  Time For Goal Achievement 08/19/17  Potential to Achieve Goals Good  PT Time Calculation  PT Start Time (ACUTE ONLY) 1354  PT Stop Time (ACUTE ONLY) 1420  PT Time Calculation (min) (ACUTE ONLY) 26 min  PT General Charges  $$ ACUTE PT VISIT 1 Visit  PT Treatments  $Gait Training 23-37 mins

## 2017-08-17 NOTE — Evaluation (Signed)
Physical Therapy Evaluation Patient Details Name: Madison Parker MRN: [id number] DOB: 1951/10/15 Today's Date: 08/17/2017   History of Present Illness  s/p L DA THA  Clinical Impression  Pt is s/p THA resulting in the deficits listed below (see PT Problem List).  Pt will benefit from skilled PT to increase their independence and safety with mobility to allow discharge to the venue listed below.      Follow Up Recommendations DC plan and follow up therapy as arranged by surgeon    Equipment Recommendations  None recommended by PT    Recommendations for Other Services       Precautions / Restrictions Precautions Precautions: Fall Restrictions Weight Bearing Restrictions: No Other Position/Activity Restrictions: WBAT      Mobility  Bed Mobility Overal bed mobility: Needs Assistance Bed Mobility: Supine to Sit     Supine to sit: Min guard     General bed mobility comments: facilitation LLE  Transfers Overall transfer level: Needs assistance Equipment used: Rolling walker (2 wheeled) Transfers: Sit to/from Stand Sit to Stand: Min guard         General transfer comment: cues for hand placement  Ambulation/Gait Ambulation/Gait assistance: Min guard Ambulation Distance (Feet): 80 Feet Assistive device: Rolling walker (2 wheeled) Gait Pattern/deviations: Step-through pattern;Step-to pattern;Antalgic     General Gait Details: cues for sequence, RW position and progression to step through  Stairs            Wheelchair Mobility    Modified Rankin (Stroke Patients Only)       Balance Overall balance assessment: Needs assistance   Sitting balance-Leahy Scale: Good       Standing balance-Leahy Scale: Fair                               Pertinent Vitals/Pain Pain Assessment: 0-10 Pain Score: 4  Pain Location: L hip Pain Descriptors / Indicators: Sore Pain Intervention(s): Limited activity within patient's tolerance;Monitored during  session;Premedicated before session;Ice applied    Home Living Family/patient expects to be discharged to:: Private residence Living Arrangements: Spouse/significant other Available Help at Discharge: Family Type of Home: House Home Access: Stairs to enter   Technical brewer of Steps: 1 Home Layout: One level Home Equipment: Environmental consultant - 2 wheels      Prior Function Level of Independence: Independent               Hand Dominance        Extremity/Trunk Assessment   Upper Extremity Assessment Upper Extremity Assessment: Overall WFL for tasks assessed    Lower Extremity Assessment Lower Extremity Assessment: LLE deficits/detail LLE Deficits / Details: hip flexion 2+/5; knee 3/5; AAROM grossly WFL        Communication   Communication: No difficulties  Cognition Arousal/Alertness: Awake/alert Behavior During Therapy: WFL for tasks assessed/performed Overall Cognitive Status: Within Functional Limits for tasks assessed                                        General Comments      Exercises Total Joint Exercises Ankle Circles/Pumps: AROM;Both;10 reps Quad Sets: Both;5 reps;AROM Heel Slides: AROM;5 reps;Left   Assessment/Plan    PT Assessment Patient needs continued PT services  PT Problem List Decreased strength;Decreased range of motion;Decreased activity tolerance;Decreased mobility;Pain       PT Treatment Interventions DME instruction;Gait  training;Stair training;Functional mobility training;Therapeutic exercise;Therapeutic activities;Patient/family education    PT Goals (Current goals can be found in the Care Plan section)  Acute Rehab PT Goals Patient Stated Goal: home soon, less pain in hip with activity PT Goal Formulation: With patient Time For Goal Achievement: 08/19/17 Potential to Achieve Goals: Good    Frequency 7X/week   Barriers to discharge        Co-evaluation               AM-PAC PT "6 Clicks" Daily  Activity  Outcome Measure Difficulty turning over in bed (including adjusting bedclothes, sheets and blankets)?: Unable Difficulty moving from lying on back to sitting on the side of the bed? : Unable Difficulty sitting down on and standing up from a chair with arms (e.g., wheelchair, bedside commode, etc,.)?: Unable Help needed moving to and from a bed to chair (including a wheelchair)?: A Little Help needed walking in hospital room?: A Little Help needed climbing 3-5 steps with a railing? : A Little 6 Click Score: 12    End of Session Equipment Utilized During Treatment: Gait belt Activity Tolerance: Patient tolerated treatment well Patient left: with call bell/phone within reach;in chair;with family/visitor present   PT Visit Diagnosis: Difficulty in walking, not elsewhere classified (R26.2);Pain Pain - Right/Left: Left Pain - part of body: Hip    Time: 6962-9528 PT Time Calculation (min) (ACUTE ONLY): 21 min   Charges:   PT Evaluation $PT Eval Low Complexity: 1 Low     PT G Codes:          Elhadji Pecore 2017/08/23, 1:22 PM

## 2017-08-17 NOTE — Progress Notes (Addendum)
Subjective: 1 Day Post-Op Procedure(s) (LRB): LEFT TOTAL HIP ARTHROPLASTY ANTERIOR APPROACH (Left) Patient reports pain as mild.  Some nausea this morning.  Has not been up yet.  Foley catheter in place.  Getting ready to be removed.  Objective: Vital signs in last 24 hours: Temp:  [97.4 F (36.3 C)-98 F (36.7 C)] 97.5 F (36.4 C) (11/03 0620) Pulse Rate:  [57-83] 76 (11/03 0620) Resp:  [12-25] 16 (11/03 0620) BP: (96-130)/(53-76) 106/53 (11/03 0620) SpO2:  [96 %-100 %] 100 % (11/03 0620) Weight:  [86.6 kg (191 lb)] 86.6 kg (191 lb) (11/02 1600)  Intake/Output from previous day: 11/02 0701 - 11/03 0700 In: 4110 [P.O.:480; I.V.:3475; IV Piggyback:155] Out: 1749 [Urine:1425; Blood:300] Intake/Output this shift: No intake/output data recorded.   Recent Labs  08/14/17 1534 08/17/17 0647  HGB 10.9* 9.8*    Recent Labs  08/14/17 1534 08/17/17 0647  WBC 3.1* 5.5  RBC 3.48* 3.11*  HCT 32.8* 28.9*  PLT 135* 114*    Recent Labs  08/14/17 1534  NA 142  K 4.4  CL 111  CO2 24  BUN 21*  CREATININE 0.74  GLUCOSE 89  CALCIUM 9.5   No results for input(s): LABPT, INR in the last 72 hours. Left hip exam: Neurovascular intact Sensation intact distally Intact pulses distally Dorsiflexion/Plantar flexion intact Incision: dressing C/D/I Compartment soft  Assessment/Plan: 1 Day Post-Op Procedure(s) (LRB): LEFT TOTAL HIP ARTHROPLASTY ANTERIOR APPROACH (Left) Plan: Discontinue Foley this a.m. Weight-bear as tolerated on left without hip precautions. Aspirin 325 mg twice daily for DVT prophylaxis. Up with therapy Discharge home with home health if passes physical therapy this morning. If not able to ambulate safely we will keep her until Sunday morning and then discharge her home.  Lyden Redner G 08/17/2017, 8:57 AM

## 2017-08-18 ENCOUNTER — Other Ambulatory Visit: Payer: Self-pay

## 2017-08-18 LAB — CBC
HEMATOCRIT: 29.4 % — AB (ref 36.0–46.0)
HEMOGLOBIN: 9.9 g/dL — AB (ref 12.0–15.0)
MCH: 31.2 pg (ref 26.0–34.0)
MCHC: 33.7 g/dL (ref 30.0–36.0)
MCV: 92.7 fL (ref 78.0–100.0)
Platelets: 119 10*3/uL — ABNORMAL LOW (ref 150–400)
RBC: 3.17 MIL/uL — AB (ref 3.87–5.11)
RDW: 12.9 % (ref 11.5–15.5)
WBC: 6 10*3/uL (ref 4.0–10.5)

## 2017-08-18 NOTE — Plan of Care (Signed)
All progression outcomes are met or adequate for discharge.

## 2017-08-18 NOTE — Discharge Summary (Signed)
Patient ID: Madison Parker MRN: [id number] DOB/AGE: November 25, 1950 66 y.o.  Admit date: 08/16/2017 Discharge date: 08/18/2017  Admission Diagnoses:  Principal Problem:   Primary osteoarthritis of left hip   Discharge Diagnoses:  Same  Past Medical History:  Diagnosis Date  . Headache   . Low back pain   . SBO (small bowel obstruction) ([place]) 2010  . Smoldering multiple myeloma ([place])     Surgeries: Procedure(s): LEFT TOTAL HIP ARTHROPLASTY ANTERIOR APPROACH on 08/16/2017   Discharged Condition: Improved  Hospital Course: Madison Parker is an 66 y.o. female who was admitted 08/16/2017 for operative treatment ofPrimary osteoarthritis of left hip. Patient has severe unremitting pain that affects sleep, daily activities, and work/hobbies. After pre-op clearance the patient was taken to the operating room on 08/16/2017 and underwent  Procedure(s): LEFT TOTAL HIP ARTHROPLASTY ANTERIOR APPROACH.    Patient was given perioperative antibiotics:  Anti-infectives (From admission, onward)   Start     Dose/Rate Route Frequency Ordered Stop   08/16/17 1700  ceFAZolin (ANCEF) IVPB 2g/100 mL premix     2 g 200 mL/hr over 30 Minutes Intravenous Every 6 hours 08/16/17 1609 08/16/17 2347   08/16/17 0911  ceFAZolin (ANCEF) IVPB 2g/100 mL premix     2 g 200 mL/hr over 30 Minutes Intravenous On call to O.R. 08/16/17 0911 08/16/17 1116       Patient was given sequential compression devices, early ambulation, and chemoprophylaxis to prevent DVT.  Patient benefited maximally from hospital stay and there were no complications.    Recent vital signs:  Patient Vitals for the past 24 hrs:  BP Temp Temp src Pulse Resp SpO2  08/17/17 2151 (!) 88/60 97.8 F (36.6 C) Oral 60 14 97 %  08/17/17 1500 (!) 109/59 97.6 F (36.4 C) Oral 68 17 96 %  08/17/17 0920 (!) 119/57 97.6 F (36.4 C) Oral 72 16 100 %     Recent laboratory studies:  Recent Labs    08/17/17 0647 08/18/17 0610  WBC 5.5 6.0  HGB 9.8*  9.9*  HCT 28.9* 29.4*  PLT 114* 119*     Discharge Medications:   Allergies as of 08/18/2017      Reactions   Codeine Anxiety      Medication List    STOP taking these medications   gabapentin 300 MG capsule Commonly known as:  NEURONTIN   ibuprofen 200 MG tablet Commonly known as:  ADVIL,MOTRIN   traMADol 50 MG tablet Commonly known as:  ULTRAM     TAKE these medications   aspirin EC 325 MG tablet Take 1 tablet (325 mg total) by mouth 2 (two) times daily after a meal. Take x 1 month post op to decrease risk of blood clots.   B-complex with vitamin C tablet Take 1 tablet by mouth daily.   CALCIUM 600+D 600-200 MG-UNIT Tabs Generic drug:  Calcium Carbonate-Vitamin D Take 1 tablet by mouth daily.   docusate sodium 100 MG capsule Commonly known as:  COLACE Take 1 capsule (100 mg total) by mouth 2 (two) times daily.   oxyCODONE-acetaminophen 5-325 MG tablet Commonly known as:  PERCOCET/ROXICET Take 1-2 tablets by mouth every 4 (four) hours as needed for severe pain.   Potassium 99 MG Tabs Take 99 mg by mouth daily.   tiZANidine 2 MG tablet Commonly known as:  ZANAFLEX Take 1 tablet (2 mg total) by mouth every 8 (eight) hours as needed for muscle spasms.   vitamin B-12 1000 MCG tablet Commonly known as:  CYANOCOBALAMIN Take 1,000 mcg by mouth daily.   Vitamin D3 2000 units Tabs Take 2,000 Units by mouth daily.            Discharge Care Instructions  (From admission, onward)        Start     Ordered   08/18/17 0000  Weight bearing as tolerated    Question Answer Comment  Laterality left   Extremity Lower      08/18/17 0832      Diagnostic Studies: Dg C-arm 1-60 Min-no Report  Result Date: 08/16/2017 Fluoroscopy was utilized by the requesting physician.  No radiographic interpretation.   Dg Hip Operative Unilat W Or W/o Pelvis Left  Result Date: 08/16/2017 CLINICAL DATA:  Status post left hip arthroplasty. EXAM: OPERATIVE left HIP (WITH  PELVIS IF PERFORMED) 2 VIEWS TECHNIQUE: Fluoroscopic spot image(s) were submitted for interpretation post-operatively. FLUOROSCOPY TIME:  16 seconds. COMPARISON:  None. FINDINGS: Two intraoperative fluoroscopic images demonstrate the patient be status post left total hip arthroplasty. The femoral and acetabular components appear to be well situated. Expected postoperative changes are noted in the surrounding soft tissues. IMPRESSION: Status post left total hip arthroplasty. Electronically Signed   By: Marijo Conception, M.D.   On: 08/16/2017 13:06    Disposition: 01-Home or Self Care  Discharge Instructions    Call MD / Call 911   Complete by:  As directed    If you experience chest pain or shortness of breath, CALL 911 and be transported to the hospital emergency room.  If you develope a fever above 101 F, pus (white drainage) or increased drainage or redness at the wound, or calf pain, call your surgeon's office.   Constipation Prevention   Complete by:  As directed    Drink plenty of fluids.  Prune juice may be helpful.  You may use a stool softener, such as Colace (over the counter) 100 mg twice a day.  Use MiraLax (over the counter) for constipation as needed.   Diet general   Complete by:  As directed    Do not sit on low chairs, stoools or toilet seats, as it may be difficult to get up from low surfaces   Complete by:  As directed    Increase activity slowly as tolerated   Complete by:  As directed    Weight bearing as tolerated   Complete by:  As directed    Laterality:  left   Extremity:  Lower      Follow-up Information    Dorna Leitz, MD. Schedule an appointment as soon as possible for a visit in 2 weeks.   Specialty:  Orthopedic Surgery Contact information: [place] [address] [phone]            Signed: Erlene Senters 08/18/2017, 8:32 AM

## 2017-08-18 NOTE — Brief Op Note (Signed)
08/16/2017  8:09 AM  PATIENT:  Madison Parker  66 y.o. female  PRE-OPERATIVE DIAGNOSIS:  OSTEOARTHRITIS LEFT HIP  POST-OPERATIVE DIAGNOSIS:  OSTEOARTHRITIS LEFT HIP  PROCEDURE:  Procedure(s): LEFT TOTAL HIP ARTHROPLASTY ANTERIOR APPROACH (Left)  SURGEON:  Surgeon(s) and Role:    Dorna Leitz, MD - Primary  PHYSICIAN ASSISTANT:   ASSISTANTS: bethune   ANESTHESIA:   spinal  EBL:  300 mL   BLOOD ADMINISTERED:none  DRAINS: none   LOCAL MEDICATIONS USED:  MARCAINE     SPECIMEN:  No Specimen  DISPOSITION OF SPECIMEN:  N/A  COUNTS:  YES  TOURNIQUET:  * No tourniquets in log *  DICTATION: .Other Dictation: Dictation Number [id number]  PLAN OF CARE: Admit to inpatient   PATIENT DISPOSITION:  PACU - hemodynamically stable.   Delay start of Pharmacological VTE agent (>24hrs) due to surgical blood loss or risk of bleeding: no

## 2017-08-18 NOTE — Plan of Care (Signed)
  Education: Knowledge of the prescribed therapeutic regimen will improve 08/18/2017 1101 - Completed/Met by Lenice Pressman, RN   Education: Understanding of discharge needs will improve 08/18/2017 1101 - Completed/Met by Lenice Pressman, RN

## 2017-08-18 NOTE — Progress Notes (Signed)
Discharge instructions reviewed with patient using teach back method no questions at this time.  Patient  Being discharged to home.

## 2017-08-18 NOTE — Progress Notes (Signed)
Subjective: 2 Days Post-Op Procedure(s) (LRB): LEFT TOTAL HIP ARTHROPLASTY ANTERIOR APPROACH (Left) Patient reports pain as mild.  Taking by mouth and voiding okay.  Making good progress with physical therapy.  Objective: Vital signs in last 24 hours: Temp:  [97.6 F (36.4 C)-97.8 F (36.6 C)] 97.8 F (36.6 C) (11/03 2151) Pulse Rate:  [60-72] 60 (11/03 2151) Resp:  [14-17] 14 (11/03 2151) BP: (88-119)/(57-60) 88/60 (11/03 2151) SpO2:  [96 %-100 %] 97 % (11/03 2151)  Intake/Output from previous day: 11/03 0701 - 11/04 0700 In: 2248.3 [P.O.:1060; I.V.:1188.3] Out: 900 [Urine:900] Intake/Output this shift: No intake/output data recorded.  Recent Labs    08/17/17 0647 08/18/17 0610  HGB 9.8* 9.9*   Recent Labs    08/17/17 0647 08/18/17 0610  WBC 5.5 6.0  RBC 3.11* 3.17*  HCT 28.9* 29.4*  PLT 114* 119*   No results for input(s): NA, K, CL, CO2, BUN, CREATININE, GLUCOSE, CALCIUM in the last 72 hours. No results for input(s): LABPT, INR in the last 72 hours. Eft hip exam: Neurovascular intact Sensation intact distally Intact pulses distally Dorsiflexion/Plantar flexion intact Incision: dressing C/D/I Compartment soft  Assessment/Plan: 2 Days Post-Op Procedure(s) (LRB): LEFT TOTAL HIP ARTHROPLASTY ANTERIOR APPROACH (Left) Plan: Weight-bear as tolerated on left without hip precautions. Aspirin 325 mg enteric-coated twice daily times 1 month postop for DVT prophylaxis. Up with therapy Discharge home with home health Follow-up with Dr. Berenice Primas in the office in 2 weeks.  Madison Parker 08/18/2017, 8:29 AM

## 2017-08-18 NOTE — Care Management Note (Signed)
Case Management Note  Patient Details  Name: Madison Parker MRN: [id number] Date of Birth: 1951-09-26  Subjective/Objective:                  LEFT TOTAL HIP ARTHROPLASTY    Action/Plan: CM spoke with the patient at the bedside. Patient states she was set up pre-operatively with home health PT by her surgeon's office. She is unable to recall the name of the home health provider but states they are on [address] in [place]. She is able to verbalize her discharge plan. States she will have 5 in-home PT visits, then follow-up with her surgeon and transition to outpatient physical therapy. She has a rolling walker at home. SCDs and a 3N1 will be delivered to her home tomorrow. She denies any additional needs.   Expected Discharge Date:  08/18/17               Expected Discharge Plan:  [place]  In-House Referral:     Discharge planning Services  CM Consult  Post Acute Care Choice:  NA Choice offered to:  NA  DME Arranged:  N/A DME Agency:  NA  HH Arranged:  NA HH Agency:  NA  Status of Service:  Completed, signed off  If discussed at [place] of Stay Meetings, dates discussed:    Additional Comments:  Apolonio Schneiders, RN 08/18/2017, 8:48 AM

## 2017-08-18 NOTE — Progress Notes (Addendum)
Physical Therapy Treatment Patient Details Name: Madison Parker MRN: [id number] DOB: 1951/08/19 Today's Date: 08/18/2017    History of Present Illness s/p L DA THA    PT Comments    Pt progressing well; pt and husband feel comfortable with plan for D.C today; husband very supportive and return demo's pt assist as needed  Follow Up Recommendations  DC plan and follow up therapy as arranged by surgeon     Equipment Recommendations  None recommended by PT    Recommendations for Other Services       Precautions / Restrictions Precautions Precautions: Fall Restrictions Weight Bearing Restrictions: No Other Position/Activity Restrictions: WBAT    Mobility  Bed Mobility         Supine to sit: Min guard;Supervision     General bed mobility comments: facilitation LLE--husband assisting as needed  Transfers Overall transfer level: Needs assistance Equipment used: Rolling walker (2 wheeled) Transfers: Sit to/from Stand Sit to Stand: Supervision;Modified independent (Device/Increase time)         General transfer comment: initial cues for hand placement  Ambulation/Gait Ambulation/Gait assistance: Supervision Ambulation Distance (Feet): 120 Feet Assistive device: Rolling walker (2 wheeled) Gait Pattern/deviations: Step-through pattern;Step-to pattern;Antalgic     General Gait Details: cues for sequence, RW position and progression to step through, equal wt shift   Stairs            Wheelchair Mobility    Modified Rankin (Stroke Patients Only)       Balance                                            Cognition Arousal/Alertness: Awake/alert Behavior During Therapy: WFL for tasks assessed/performed Overall Cognitive Status: Within Functional Limits for tasks assessed                                        Exercises Total Joint Exercises Quad Sets: Both;5 reps;AROM Gluteal Sets: AROM;Both;5 reps Hip  ABduction/ADduction: AROM;Strengthening;Left;10 reps;Standing Knee Flexion: AROM;Strengthening;Left;10 reps;Standing Marching in Standing: AROM;Left;10 reps;Standing;Strengthening Standing Hip Extension: AROM;Left;Strengthening;10 reps;Standing    General Comments        Pertinent Vitals/Pain Pain Assessment: 0-10 Pain Score: 4  Pain Location: L hip Pain Descriptors / Indicators: Sore Pain Intervention(s): Limited activity within patient's tolerance;Monitored during session;Premedicated before session;Ice applied    Home Living                      Prior Function            PT Goals (current goals can now be found in the care plan section) Acute Rehab PT Goals Patient Stated Goal: home soon, less pain in hip with activity PT Goal Formulation: With patient Time For Goal Achievement: 08/19/17 Potential to Achieve Goals: Good Progress towards PT goals: Progressing toward goals    Frequency    7X/week      PT Plan Current plan remains appropriate    Co-evaluation              AM-PAC PT "6 Clicks" Daily Activity  Outcome Measure  Difficulty turning over in bed (including adjusting bedclothes, sheets and blankets)?: A Little Difficulty moving from lying on back to sitting on the side of the bed? : A Little Difficulty sitting down on and  standing up from a chair with arms (e.g., wheelchair, bedside commode, etc,.)?: A Little Help needed moving to and from a bed to chair (including a wheelchair)?: A Little Help needed walking in hospital room?: A Little Help needed climbing 3-5 steps with a railing? : A Little 6 Click Score: 18    End of Session Equipment Utilized During Treatment: Gait belt Activity Tolerance: Patient tolerated treatment well Patient left: Other (comment);with call bell/phone within reach;with family/visitor present(EOB)   PT Visit Diagnosis: Difficulty in walking, not elsewhere classified (R26.2);Pain Pain - Right/Left: Left Pain -  part of body: Hip     Time: 2575-0518 PT Time Calculation (min) (ACUTE ONLY): 23 min  Charges:   $Therapeutic Exercise: 8-22 mins                    G Codes:          Kinston Magnan 09-16-2017, 1:06 PM

## 2017-08-19 DIAGNOSIS — D649 Anemia, unspecified: Secondary | ICD-10-CM | POA: Diagnosis not present

## 2017-08-19 DIAGNOSIS — M47816 Spondylosis without myelopathy or radiculopathy, lumbar region: Secondary | ICD-10-CM | POA: Diagnosis not present

## 2017-08-19 DIAGNOSIS — Z96642 Presence of left artificial hip joint: Secondary | ICD-10-CM | POA: Diagnosis not present

## 2017-08-19 DIAGNOSIS — C9000 Multiple myeloma not having achieved remission: Secondary | ICD-10-CM | POA: Diagnosis not present

## 2017-08-19 DIAGNOSIS — Z471 Aftercare following joint replacement surgery: Secondary | ICD-10-CM | POA: Diagnosis not present

## 2017-08-19 DIAGNOSIS — Z96653 Presence of artificial knee joint, bilateral: Secondary | ICD-10-CM | POA: Diagnosis not present

## 2017-08-19 DIAGNOSIS — E039 Hypothyroidism, unspecified: Secondary | ICD-10-CM | POA: Diagnosis not present

## 2017-08-19 DIAGNOSIS — Z9884 Bariatric surgery status: Secondary | ICD-10-CM | POA: Diagnosis not present

## 2017-08-19 NOTE — Op Note (Signed)
NAME:  MIOSHA, BEHE                    ACCOUNT NO.:  MEDICAL RECORD NO.:  [id number]  LOCATION:                                 FACILITY:  PHYSICIAN:  Al[facility] Corning, M.D.        DATE OF BIRTH:  DATE OF PROCEDURE:  08/16/2017 DATE OF DISCHARGE:                              OPERATIVE REPORT   PREOPERATIVE DIAGNOSIS:  End-stage degenerative joint disease, left hip.  POSTOPERATIVE DIAGNOSIS:  End-stage degenerative joint disease, left hip.  PROCEDURE: 1. Left total hip replacement with a Corail stem size 10 standard     offset, 52 mm Gription porous-coated cup, 36 mm +5 delta ceramic     hip ball, and a 36 mm neutral liner for a 52 mm cup. 2. Interpretation of multiple intraoperative fluoroscopic images.  SURGEON:  Alta Corning, M.D.  ASSISTANCE:  Gary Fleet, P.A.  ANESTHESIA:  General.  BRIEF HISTORY:  Ms. Hunn is a 66 year old female with a long history of significant complaints of left hip pain.  She had been treated conservatively for prolonged period of time.  X-ray showed severe end- stage degenerative joint disease, with bone-on-bone change.  The patient was having night pain and light activity pain.  She was taken to the operating room for evaluation and procedure.  DESCRIPTION OF PROCEDURE:  The patient was taken to the operating room. After adequate anesthesia was obtained with spinal anesthetic, the patient was placed on the Hana bed and all bony prominences were well padded.  Attention was then turned to the left hip where after routine prep and drape, an incision was made for an anterior approach to the hip, subcutaneous tissue down the level of the tensor fascia, which was identified.  Incision was made in the fascia and the muscle was finger dissected free.  Retractors were put in place above and below the hip and the vessels along the anterior aspect of the hip were cauterized at length.  When this was completed, attention was turned to the  anterior hip where the capsule was opened and tagged.  The provisional neck cut was made and the head removed.  Retractors were put in place around the acetabulum.  It was sequentially reamed to a level of 51 mm, and a 52 mm Gription cup was hammered into place 45 degrees of lateral opening, 30 degrees of anteversion.  Once this was completed, attention was turned towards putting the hip in an extended and externally rotated position. Posterior capsule was released and the stem side was sequentially rasped up to a level of a Corail size 10.  A standard neck was chosen with a +0 ball and we made a trial reduction.  The leg lengths were assessed at that time, and felt to be just slightly short.  At that point, we went ahead and dislocated the hip.  The 10 showed an excellent fit.  A 10 size K was opened in place.  We then trialed a +5 ball and looked at that leg length and felt that was more symmetric and she did have an arthritic hip on the other side and we are going to have to lengthen  her slightly on the other side at some point, so we did go with the +5 ball. At this point, the hip was irrigated thoroughly, suctioned dry.  The final ball was placed after a dislocation and placement of the new ball. Final fluoro images were taken.  At this point, the hip capsule was closed with 1 Vicryl running, this tensor fascia was closed with 1 Vicryl running, skin with 0 and 2-0 Vicryl and 3-0 Monocryl subcuticular.  Benzoin and Steri- Strips were applied.  Sterile compressive dressing was applied.  The patient was taken to the recovery room and she was noted to be in satisfactory condition.  Estimated blood loss for the procedure was minimal.     [facility] Corning, M.D.     Corliss Skains  D:  08/18/2017  T:  08/18/2017  Job:  [id number]

## 2017-08-21 DIAGNOSIS — Z96642 Presence of left artificial hip joint: Secondary | ICD-10-CM | POA: Diagnosis not present

## 2017-08-21 DIAGNOSIS — M47816 Spondylosis without myelopathy or radiculopathy, lumbar region: Secondary | ICD-10-CM | POA: Diagnosis not present

## 2017-08-21 DIAGNOSIS — C9000 Multiple myeloma not having achieved remission: Secondary | ICD-10-CM | POA: Diagnosis not present

## 2017-08-21 DIAGNOSIS — E039 Hypothyroidism, unspecified: Secondary | ICD-10-CM | POA: Diagnosis not present

## 2017-08-21 DIAGNOSIS — Z471 Aftercare following joint replacement surgery: Secondary | ICD-10-CM | POA: Diagnosis not present

## 2017-08-21 DIAGNOSIS — D649 Anemia, unspecified: Secondary | ICD-10-CM | POA: Diagnosis not present

## 2017-08-23 DIAGNOSIS — E039 Hypothyroidism, unspecified: Secondary | ICD-10-CM | POA: Diagnosis not present

## 2017-08-23 DIAGNOSIS — M47816 Spondylosis without myelopathy or radiculopathy, lumbar region: Secondary | ICD-10-CM | POA: Diagnosis not present

## 2017-08-23 DIAGNOSIS — C9000 Multiple myeloma not having achieved remission: Secondary | ICD-10-CM | POA: Diagnosis not present

## 2017-08-23 DIAGNOSIS — D649 Anemia, unspecified: Secondary | ICD-10-CM | POA: Diagnosis not present

## 2017-08-23 DIAGNOSIS — Z96642 Presence of left artificial hip joint: Secondary | ICD-10-CM | POA: Diagnosis not present

## 2017-08-23 DIAGNOSIS — Z471 Aftercare following joint replacement surgery: Secondary | ICD-10-CM | POA: Diagnosis not present

## 2017-08-26 DIAGNOSIS — M25652 Stiffness of left hip, not elsewhere classified: Secondary | ICD-10-CM | POA: Diagnosis not present

## 2017-08-26 DIAGNOSIS — M1612 Unilateral primary osteoarthritis, left hip: Secondary | ICD-10-CM | POA: Diagnosis not present

## 2017-08-26 DIAGNOSIS — M25462 Effusion, left knee: Secondary | ICD-10-CM | POA: Diagnosis not present

## 2017-08-26 DIAGNOSIS — Z96642 Presence of left artificial hip joint: Secondary | ICD-10-CM | POA: Diagnosis not present

## 2017-08-26 DIAGNOSIS — M25552 Pain in left hip: Secondary | ICD-10-CM | POA: Diagnosis not present

## 2017-08-28 DIAGNOSIS — M25462 Effusion, left knee: Secondary | ICD-10-CM | POA: Diagnosis not present

## 2017-08-28 DIAGNOSIS — M25652 Stiffness of left hip, not elsewhere classified: Secondary | ICD-10-CM | POA: Diagnosis not present

## 2017-08-28 DIAGNOSIS — Z96642 Presence of left artificial hip joint: Secondary | ICD-10-CM | POA: Diagnosis not present

## 2017-08-28 DIAGNOSIS — M25552 Pain in left hip: Secondary | ICD-10-CM | POA: Diagnosis not present

## 2017-08-30 DIAGNOSIS — M25652 Stiffness of left hip, not elsewhere classified: Secondary | ICD-10-CM | POA: Diagnosis not present

## 2017-08-30 DIAGNOSIS — M25462 Effusion, left knee: Secondary | ICD-10-CM | POA: Diagnosis not present

## 2017-08-30 DIAGNOSIS — Z96642 Presence of left artificial hip joint: Secondary | ICD-10-CM | POA: Diagnosis not present

## 2017-08-30 DIAGNOSIS — M25552 Pain in left hip: Secondary | ICD-10-CM | POA: Diagnosis not present

## 2017-09-02 DIAGNOSIS — Z96642 Presence of left artificial hip joint: Secondary | ICD-10-CM | POA: Diagnosis not present

## 2017-09-02 DIAGNOSIS — M25462 Effusion, left knee: Secondary | ICD-10-CM | POA: Diagnosis not present

## 2017-09-02 DIAGNOSIS — M25552 Pain in left hip: Secondary | ICD-10-CM | POA: Diagnosis not present

## 2017-09-02 DIAGNOSIS — M25652 Stiffness of left hip, not elsewhere classified: Secondary | ICD-10-CM | POA: Diagnosis not present

## 2017-09-04 DIAGNOSIS — Z96642 Presence of left artificial hip joint: Secondary | ICD-10-CM | POA: Diagnosis not present

## 2017-09-04 DIAGNOSIS — M25552 Pain in left hip: Secondary | ICD-10-CM | POA: Diagnosis not present

## 2017-09-04 DIAGNOSIS — M25462 Effusion, left knee: Secondary | ICD-10-CM | POA: Diagnosis not present

## 2017-09-04 DIAGNOSIS — M25652 Stiffness of left hip, not elsewhere classified: Secondary | ICD-10-CM | POA: Diagnosis not present

## 2017-09-09 DIAGNOSIS — M25552 Pain in left hip: Secondary | ICD-10-CM | POA: Diagnosis not present

## 2017-09-09 DIAGNOSIS — Z9889 Other specified postprocedural states: Secondary | ICD-10-CM | POA: Diagnosis not present

## 2017-09-09 DIAGNOSIS — Z96642 Presence of left artificial hip joint: Secondary | ICD-10-CM | POA: Diagnosis not present

## 2017-09-09 DIAGNOSIS — M25652 Stiffness of left hip, not elsewhere classified: Secondary | ICD-10-CM | POA: Diagnosis not present

## 2017-09-09 DIAGNOSIS — M25462 Effusion, left knee: Secondary | ICD-10-CM | POA: Diagnosis not present

## 2017-09-09 DIAGNOSIS — M1612 Unilateral primary osteoarthritis, left hip: Secondary | ICD-10-CM | POA: Diagnosis not present

## 2017-09-11 DIAGNOSIS — M25552 Pain in left hip: Secondary | ICD-10-CM | POA: Diagnosis not present

## 2017-09-11 DIAGNOSIS — M25452 Effusion, left hip: Secondary | ICD-10-CM | POA: Diagnosis not present

## 2017-09-11 DIAGNOSIS — M25652 Stiffness of left hip, not elsewhere classified: Secondary | ICD-10-CM | POA: Diagnosis not present

## 2017-09-11 DIAGNOSIS — Z96642 Presence of left artificial hip joint: Secondary | ICD-10-CM | POA: Diagnosis not present

## 2017-09-16 DIAGNOSIS — Z96642 Presence of left artificial hip joint: Secondary | ICD-10-CM | POA: Diagnosis not present

## 2017-09-16 DIAGNOSIS — M25452 Effusion, left hip: Secondary | ICD-10-CM | POA: Diagnosis not present

## 2017-09-16 DIAGNOSIS — M25652 Stiffness of left hip, not elsewhere classified: Secondary | ICD-10-CM | POA: Diagnosis not present

## 2017-09-16 DIAGNOSIS — M25552 Pain in left hip: Secondary | ICD-10-CM | POA: Diagnosis not present

## 2017-09-18 DIAGNOSIS — M25452 Effusion, left hip: Secondary | ICD-10-CM | POA: Diagnosis not present

## 2017-09-18 DIAGNOSIS — M25552 Pain in left hip: Secondary | ICD-10-CM | POA: Diagnosis not present

## 2017-09-18 DIAGNOSIS — M25652 Stiffness of left hip, not elsewhere classified: Secondary | ICD-10-CM | POA: Diagnosis not present

## 2017-09-18 DIAGNOSIS — Z96642 Presence of left artificial hip joint: Secondary | ICD-10-CM | POA: Diagnosis not present

## 2017-09-25 DIAGNOSIS — M25552 Pain in left hip: Secondary | ICD-10-CM | POA: Diagnosis not present

## 2017-09-25 DIAGNOSIS — M25452 Effusion, left hip: Secondary | ICD-10-CM | POA: Diagnosis not present

## 2017-09-25 DIAGNOSIS — M25652 Stiffness of left hip, not elsewhere classified: Secondary | ICD-10-CM | POA: Diagnosis not present

## 2017-09-25 DIAGNOSIS — Z96642 Presence of left artificial hip joint: Secondary | ICD-10-CM | POA: Diagnosis not present

## 2017-09-27 DIAGNOSIS — Z96642 Presence of left artificial hip joint: Secondary | ICD-10-CM | POA: Diagnosis not present

## 2017-09-27 DIAGNOSIS — L03019 Cellulitis of unspecified finger: Secondary | ICD-10-CM | POA: Diagnosis not present

## 2017-09-27 DIAGNOSIS — Z6834 Body mass index (BMI) 34.0-34.9, adult: Secondary | ICD-10-CM | POA: Diagnosis not present

## 2017-09-30 ENCOUNTER — Ambulatory Visit (HOSPITAL_BASED_OUTPATIENT_CLINIC_OR_DEPARTMENT_OTHER): Payer: Medicare Other | Admitting: Hematology

## 2017-09-30 ENCOUNTER — Encounter: Payer: Self-pay | Admitting: Hematology

## 2017-09-30 ENCOUNTER — Telehealth: Payer: Self-pay | Admitting: Hematology

## 2017-09-30 ENCOUNTER — Other Ambulatory Visit (HOSPITAL_BASED_OUTPATIENT_CLINIC_OR_DEPARTMENT_OTHER): Payer: Medicare Other

## 2017-09-30 VITALS — BP 136/75 | HR 87 | Temp 98.5°F | Resp 18 | Ht 63.0 in | Wt 199.3 lb

## 2017-09-30 DIAGNOSIS — M25652 Stiffness of left hip, not elsewhere classified: Secondary | ICD-10-CM | POA: Diagnosis not present

## 2017-09-30 DIAGNOSIS — D472 Monoclonal gammopathy: Secondary | ICD-10-CM

## 2017-09-30 DIAGNOSIS — C9000 Multiple myeloma not having achieved remission: Secondary | ICD-10-CM

## 2017-09-30 DIAGNOSIS — Z96642 Presence of left artificial hip joint: Secondary | ICD-10-CM | POA: Diagnosis not present

## 2017-09-30 DIAGNOSIS — T4145XA Adverse effect of unspecified anesthetic, initial encounter: Secondary | ICD-10-CM | POA: Diagnosis not present

## 2017-09-30 DIAGNOSIS — D649 Anemia, unspecified: Secondary | ICD-10-CM | POA: Diagnosis not present

## 2017-09-30 DIAGNOSIS — M25552 Pain in left hip: Secondary | ICD-10-CM | POA: Diagnosis not present

## 2017-09-30 DIAGNOSIS — M545 Low back pain: Secondary | ICD-10-CM | POA: Diagnosis not present

## 2017-09-30 DIAGNOSIS — Z9889 Other specified postprocedural states: Secondary | ICD-10-CM | POA: Diagnosis not present

## 2017-09-30 DIAGNOSIS — M899 Disorder of bone, unspecified: Secondary | ICD-10-CM | POA: Diagnosis not present

## 2017-09-30 DIAGNOSIS — M25452 Effusion, left hip: Secondary | ICD-10-CM | POA: Diagnosis not present

## 2017-09-30 DIAGNOSIS — M1612 Unilateral primary osteoarthritis, left hip: Secondary | ICD-10-CM | POA: Diagnosis not present

## 2017-09-30 LAB — CBC & DIFF AND RETIC
BASO%: 0.3 % (ref 0.0–2.0)
Basophils Absolute: 0 10*3/uL (ref 0.0–0.1)
EOS%: 0.7 % (ref 0.0–7.0)
Eosinophils Absolute: 0 10*3/uL (ref 0.0–0.5)
HCT: 35.1 % (ref 34.8–46.6)
HGB: 11.3 g/dL — ABNORMAL LOW (ref 11.6–15.9)
Immature Retic Fract: 10.4 % — ABNORMAL HIGH (ref 1.60–10.00)
LYMPH#: 1.3 10*3/uL (ref 0.9–3.3)
LYMPH%: 42.6 % (ref 14.0–49.7)
MCH: 31.4 pg (ref 25.1–34.0)
MCHC: 32.2 g/dL (ref 31.5–36.0)
MCV: 97.5 fL (ref 79.5–101.0)
MONO#: 0.2 10*3/uL (ref 0.1–0.9)
MONO%: 5.1 % (ref 0.0–14.0)
NEUT%: 51.3 % (ref 38.4–76.8)
NEUTROS ABS: 1.5 10*3/uL (ref 1.5–6.5)
Platelets: 172 10*3/uL (ref 145–400)
RBC: 3.6 10*6/uL — AB (ref 3.70–5.45)
RDW: 14 % (ref 11.2–14.5)
RETIC CT ABS: 65.88 10*3/uL (ref 33.70–90.70)
Retic %: 1.83 % (ref 0.70–2.10)
WBC: 3 10*3/uL — AB (ref 3.9–10.3)

## 2017-09-30 LAB — COMPREHENSIVE METABOLIC PANEL
ALBUMIN: 4.2 g/dL (ref 3.5–5.0)
ALK PHOS: 134 U/L (ref 40–150)
ALT: 16 U/L (ref 0–55)
AST: 19 U/L (ref 5–34)
Anion Gap: 9 mEq/L (ref 3–11)
BUN: 18.9 mg/dL (ref 7.0–26.0)
CHLORIDE: 108 meq/L (ref 98–109)
CO2: 23 mEq/L (ref 22–29)
Calcium: 9.5 mg/dL (ref 8.4–10.4)
Creatinine: 0.8 mg/dL (ref 0.6–1.1)
GLUCOSE: 82 mg/dL (ref 70–140)
POTASSIUM: 3.9 meq/L (ref 3.5–5.1)
SODIUM: 140 meq/L (ref 136–145)
Total Bilirubin: 0.63 mg/dL (ref 0.20–1.20)
Total Protein: 7.4 g/dL (ref 6.4–8.3)

## 2017-09-30 NOTE — Telephone Encounter (Signed)
Gave avs and calendar for April 2019 °

## 2017-10-01 LAB — THROMBIN TIME: THROMBIN TIME: 16.2 s (ref 0.0–23.0)

## 2017-10-01 LAB — KAPPA/LAMBDA LIGHT CHAINS
IG KAPPA FREE LIGHT CHAIN: 315.1 mg/L — AB (ref 3.3–19.4)
IG LAMBDA FREE LIGHT CHAIN: 10.7 mg/L (ref 5.7–26.3)
Kappa/Lambda FluidC Ratio: 29.45 — ABNORMAL HIGH (ref 0.26–1.65)

## 2017-10-02 DIAGNOSIS — M25452 Effusion, left hip: Secondary | ICD-10-CM | POA: Diagnosis not present

## 2017-10-02 DIAGNOSIS — M25552 Pain in left hip: Secondary | ICD-10-CM | POA: Diagnosis not present

## 2017-10-02 DIAGNOSIS — Z96642 Presence of left artificial hip joint: Secondary | ICD-10-CM | POA: Diagnosis not present

## 2017-10-02 DIAGNOSIS — M25652 Stiffness of left hip, not elsewhere classified: Secondary | ICD-10-CM | POA: Diagnosis not present

## 2017-10-02 LAB — PTT FACTOR INHIBITOR (MIXING STUDY)

## 2017-10-04 LAB — MULTIPLE MYELOMA PANEL, SERUM
ALBUMIN/GLOB SERPL: 1.4 (ref 0.7–1.7)
Albumin SerPl Elph-Mcnc: 3.9 g/dL (ref 2.9–4.4)
Alpha 1: 0.2 g/dL (ref 0.0–0.4)
Alpha2 Glob SerPl Elph-Mcnc: 0.7 g/dL (ref 0.4–1.0)
B-GLOBULIN SERPL ELPH-MCNC: 1.1 g/dL (ref 0.7–1.3)
GAMMA GLOB SERPL ELPH-MCNC: 0.8 g/dL (ref 0.4–1.8)
GLOBULIN, TOTAL: 2.9 g/dL (ref 2.2–3.9)
IgA, Qn, Serum: 59 mg/dL — ABNORMAL LOW (ref 87–352)
IgG, Qn, Serum: 823 mg/dL (ref 700–1600)
IgM, Qn, Serum: 44 mg/dL (ref 26–217)
Total Protein: 6.8 g/dL (ref 6.0–8.5)

## 2017-10-07 DIAGNOSIS — M25452 Effusion, left hip: Secondary | ICD-10-CM | POA: Diagnosis not present

## 2017-10-07 DIAGNOSIS — Z96642 Presence of left artificial hip joint: Secondary | ICD-10-CM | POA: Diagnosis not present

## 2017-10-07 DIAGNOSIS — M25552 Pain in left hip: Secondary | ICD-10-CM | POA: Diagnosis not present

## 2017-10-07 DIAGNOSIS — M25652 Stiffness of left hip, not elsewhere classified: Secondary | ICD-10-CM | POA: Diagnosis not present

## 2017-10-14 DIAGNOSIS — M25452 Effusion, left hip: Secondary | ICD-10-CM | POA: Diagnosis not present

## 2017-10-14 DIAGNOSIS — M25652 Stiffness of left hip, not elsewhere classified: Secondary | ICD-10-CM | POA: Diagnosis not present

## 2017-10-14 DIAGNOSIS — M25552 Pain in left hip: Secondary | ICD-10-CM | POA: Diagnosis not present

## 2017-10-14 DIAGNOSIS — Z96642 Presence of left artificial hip joint: Secondary | ICD-10-CM | POA: Diagnosis not present

## 2017-10-14 NOTE — Progress Notes (Signed)
[facility]    HEMATOLOGY/ONCOLOGY CLINIC NOTE  Date of Service: .09/30/2017   PCP: Marzetta Board MD  CHIEF COMPLAINTS/PURPOSE OF CONSULTATION:  Smoldering multiple myeloma  HISTORY OF PRESENTING ILLNESS:   Madison Parker is a wonderful 66 y.o. 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  Patient has a history of smoldering multiple myeloma which she reports she was diagnosed with in 2011 when she was evaluated by a hematologist in [place]. She reports that she presented with low blood counts (low WBC)   and after significant workup she had a bone marrow examination based on which she was told that she has smoldering multiple myeloma. Patient notes that she had a bone survey at that time which was negative. She was monitored there closely and then moved to [facility] in 2015 to stay with her son whose wife was being treated for breast cancer. Patient was following with Dr. Delight Hoh in [place]. She reports not having had any treatment for multiple myeloma.  Her last labs from about a year ago showed a SPEP with no OBSERVED M spike . Serum free light chains showed an elevation of kappa Free light chain 310 and lambda free light chain of 12.7 with an abnormal Kappa/ Lambda ratio of 24.38 ( up from 19 previously). Random urine showed an M protein complement of 44%.  She lives in [place] and requested transfer of care to [place]. She was offered follow-up but [facility] in [place]  But prefers to  follow [place] in [place]. Patient reports her energy levels been stable. She has chronic back pain which has improved since the patient had her spinal surgery in April 2017 (L4-L5 interbody fusion). Patient notes she has had some chronic left hip pain which is somewhat more bothersome with the navy on the lateral aspect of her upper thighs that's painful. She also notes some right hip pain. Over  the last few weeks he notes some upper neck pain especially when bending her neck backwards and tingling numbness in her right upper extremity.  Has been working on losing some weight voluntarily.  No acute other new symptoms.  INTERVAL HISTORY  [name] is here for f/u for her smoldering multiple myeloma. She reports that she is doing well overall and has been recovering very well after her left THA. She notes no issues with operative complications.Working with PT and notes that she is ambulating better. In good spirits. No other acute new focal symptoms.  On review of systems, pt reports severe hip pain, and denies fever, chills and any other accompanying symptoms.    MEDICAL HISTORY:  Past Medical History:  Diagnosis Date  . Headache   . Low back pain   . SBO (small bowel obstruction) ([facility]) 2010  . Smoldering multiple myeloma ([facility])   Previous history of hypothyroidism- not currently medications Anxiety Obesity .Body mass index is 35.3 kg/m. Vitamin D deficiency Smoldering multiple myeloma diagnosed in 2011 Lumbosacral radiculopathy Left hip pain  SURGICAL HISTORY: Past Surgical History:  Procedure Laterality Date  . ABDOMINAL HYSTERECTOMY     complete  . BACK SURGERY     lower at [facility]  . colonscopy  2014  . GASTRIC BYPASS  yrs ago  . HERNIA REPAIR    . KNEE SURGERY  06/04/2009   both knees replaced   . TONSILLECTOMY  age 89  . TOTAL HIP ARTHROPLASTY Left 08/16/2017   Procedure:  LEFT TOTAL HIP ARTHROPLASTY ANTERIOR APPROACH;  Surgeon: Dorna Leitz, MD;  Location: [facility];  Service: Orthopedics;  Laterality: Left;  Spinal surgery April 2017  SOCIAL HISTORY: Social History   Socioeconomic History  . Marital status: Married    Spouse name: Not on file  . Number of children: Not on file  . Years of education: Not on file  . Highest education level: Not on file  Social Needs  . Financial resource strain: Not on file  . Food insecurity - worry: Not on file    . Food insecurity - inability: Not on file  . Transportation needs - medical: Not on file  . Transportation needs - non-medical: Not on file  Occupational History  . Not on file  Tobacco Use  . Smoking status: Former Smoker    Packs/day: 0.50    Years: 10.00    Pack years: 5.00    Types: Cigarettes  . Smokeless tobacco: Never Used  . Tobacco comment: quit 1977  Substance and Sexual Activity  . Alcohol use: Yes    Comment: occ  . Drug use: No  . Sexual activity: Not on file  Other Topics Concern  . Not on file  Social History Narrative  . Not on file  Occasional alcohol use Former smoker and smoked 1 pack per week for about 9 years has since quit.  FAMILY HISTORY: History reviewed. No pertinent family history.  ALLERGIES:  is allergic to codeine.  MEDICATIONS:  Current Outpatient Medications  Medication Sig Dispense Refill  . aspirin EC 325 MG tablet Take 1 tablet (325 mg total) by mouth 2 (two) times daily after a meal. Take x 1 month post op to decrease risk of blood clots. 60 tablet 0  . B Complex-C (B-COMPLEX WITH VITAMIN C) tablet Take 1 tablet by mouth daily.    . Calcium Carbonate-Vitamin D (CALCIUM 600+D) 600-200 MG-UNIT TABS Take 1 tablet by mouth daily.     . Cholecalciferol (VITAMIN D3) 2000 units TABS Take 2,000 Units by mouth daily.     . Potassium 99 MG TABS Take 99 mg by mouth daily.    . vitamin B-12 (CYANOCOBALAMIN) 1000 MCG tablet Take 1,000 mcg by mouth daily.     No current facility-administered medications for this visit.     REVIEW OF SYSTEMS:    10 Point review of Systems was done is negative except as noted above.  PHYSICAL EXAMINATION: ECOG PERFORMANCE STATUS: 1 - Symptomatic but completely ambulatory  . Vitals:   09/30/17 1249  BP: 136/75  Pulse: 87  Resp: 18  Temp: 98.5 F (36.9 C)  SpO2: 100%   Filed Weights   09/30/17 1249  Weight: 199 lb 4.8 oz (90.4 kg)   .Body mass index is 35.3 kg/m.  GENERAL:alert, in no acute  distress and comfortable SKIN: skin color, texture, turgor are normal, no rashes or significant lesions EYES: normal, conjunctiva are pink and non-injected, sclera clear OROPHARYNX:no exudate, no erythema and lips, buccal mucosa, and tongue normal  NECK: supple, no JVD, thyroid normal size, non-tender, without nodularity LYMPH:  no palpable lymphadenopathy in the cervical, axillary or inguinal LUNGS: clear to auscultation with normal respiratory effort HEART: regular rate & rhythm,  no murmurs and no lower extremity edema ABDOMEN: abdomen soft, non-tender, normoactive bowel sounds  Musculoskeletal: no cyanosis of digits and no clubbing  PSYCH: alert & oriented x 3 with fluent speech NEURO: no focal motor/sensory deficits  LABORATORY DATA:  I have reviewed the data as  listed  . CBC Latest Ref Rng & Units 09/30/2017 08/18/2017 08/17/2017  WBC 3.9 - 10.3 10e3/uL 3.0(L) 6.0 5.5  Hemoglobin 11.6 - 15.9 g/dL 11.3(L) 9.9(L) 9.8(L)  Hematocrit 34.8 - 46.6 % 35.1 29.4(L) 28.9(L)  Platelets 145 - 400 10e3/uL 172 119(L) 114(L)  ANC 1500 . CBC    Component Value Date/Time   WBC 3.0 (L) 09/30/2017 1145   WBC 6.0 08/18/2017 0610   RBC 3.60 (L) 09/30/2017 1145   RBC 3.17 (L) 08/18/2017 0610   HGB 11.3 (L) 09/30/2017 1145   HCT 35.1 09/30/2017 1145   PLT 172 09/30/2017 1145   MCV 97.5 09/30/2017 1145   MCH 31.4 09/30/2017 1145   MCH 31.2 08/18/2017 0610   MCHC 32.2 09/30/2017 1145   MCHC 33.7 08/18/2017 0610   RDW 14.0 09/30/2017 1145   LYMPHSABS 1.3 09/30/2017 1145   MONOABS 0.2 09/30/2017 1145   EOSABS 0.0 09/30/2017 1145   EOSABS 0.1 06/03/2014 1003   BASOSABS 0.0 09/30/2017 1145     . CMP Latest Ref Rng & Units 09/30/2017 09/30/2017 08/14/2017  Glucose 70 - 140 mg/dl 82 - 89  BUN 7.0 - 26.0 mg/dL 18.9 - 21(H)  Creatinine 0.6 - 1.1 mg/dL 0.8 - 0.74  Sodium 136 - 145 mEq/L 140 - 142  Potassium 3.5 - 5.1 mEq/L 3.9 - 4.4  Chloride 101 - 111 mmol/L - - 111  CO2 22 - 29 mEq/L 23 -  24  Calcium 8.4 - 10.4 mg/dL 9.5 - 9.5  Total Protein 6.0 - 8.5 g/dL 7.4 6.8 -  Total Bilirubin 0.20 - 1.20 mg/dL 0.63 - -  Alkaline Phos 40 - 150 U/L 134 - -  AST 5 - 34 U/L 19 - -  ALT 0 - 55 U/L 16 - -   Component     Latest Ref Rng & Units 11/08/2016  LDH     125 - 245 U/L 220  Beta 2     0.6 - 2.4 mg/L 2.5 (H)   Component     Latest Ref Rng & Units 07/30/2017  Ferritin     9 - 269 ng/ml 81  Vitamin B12     232 - 1,245 pg/mL 594           RADIOGRAPHIC STUDIES: I have personally reviewed the radiological images as listed and agreed with the findings in the report. No results found.   Bone survey 11/08/2016 IMPRESSION: 1. Small lytic lesion in the right scapula. 2. Possible small lytic lesion in the left scapula. 3. Postoperative changes. 4. Significant degenerative changes in both hips, left greater than right. 5. No evidence for acute fracture   Electronically Signed   By: Nolon Nations M.D.   On: 11/08/2016 16:19   ASSESSMENT & PLAN:   66 year old very pleasant lady with history of   1) Smoldering Multiple Myeloma vs Multiple myeloma (concern for small lytic lesions in left and right scapulae) - (appears light chain producing) This was apparently diagnosed in 2011 by her hematologist in [place]. Patient has some mild stable anemia with a hemoglobin of 11.2 today  No renal failure/hypercalcemia. Bone survey with concern fr possible small lytic lesions in B/L scapulae. PET/CT scan on 03/12/2017 showed no concerning bone lesions. Bm Bx with 17% kappa restriction plasma cells consistent with plasma cell neoplasm. No treatment so far.  2) IgG Lambda M spike of 0.2g/dl -- on labs today M spike -- undetectable. 3)Stable Kappa Lambda SFLC with increased K/L ratio 4) Chronic  leukopenia - ? Related to SMM vs other nutritional deficiencies (h/o gastric bypass surgery put her at risk for nutritional deficiencies) B12 levels WNL Ferritin  adequate ?additional factor -recent NSAID use. ?element of benign ethnic neutropenia. Has not had any issues with frequent infection. Anticipate will rise appropriately with surgery. ANC post-operative normalized today at 1500 PLAN -lab results discussed with the patient. -she is doing well after her Left hip THA and not had issues with bleeding/clotting or infectious issues. -no overt evidence ofprogression of her SMM at this time to warrant treatment considerations -would recommend appropriate peri-operative VTE prophylaxis -She was recommended to take her B complex and B12 regularly. -reasonable to take Iron polysaccharide 162m po daily to maintain Iron stores given anticipated blood loss with surgery. -Follow-up and optimize treatment for hypothyroidism with primary care physician.  3) Neuropathy/radiculopathy -continue on her gabapentin 300 mg by mouth at bedtime -Tramadol 50 mg by mouth every 6 hours as needed for her left hip arthritis pain and neuropathy/radiculopathy.   RTC with Dr KIrene Limbowith labs in 445monthwith rpt labs  All of the patients questions were answered with apparent satisfaction. The patient knows to call the clinic with any problems, questions or concerns.  I spent 20 minutes counseling the patient face to face. The total time spent in the appointment was 25 minutes and more than 50% was on counseling and direct patient cares.    GaSullivan LoneD MSCoral SpringsAHIVMS SCDurham Va Medical CenterTFaith Community Hospitalematology/Oncology Physician [facility](Office):       [phone] cell):  [phone]):           [phone]

## 2017-10-17 DIAGNOSIS — E039 Hypothyroidism, unspecified: Secondary | ICD-10-CM | POA: Diagnosis not present

## 2017-10-17 DIAGNOSIS — Z0000 Encounter for general adult medical examination without abnormal findings: Secondary | ICD-10-CM | POA: Diagnosis not present

## 2017-10-18 DIAGNOSIS — C9000 Multiple myeloma not having achieved remission: Secondary | ICD-10-CM | POA: Diagnosis not present

## 2017-10-18 DIAGNOSIS — E7800 Pure hypercholesterolemia, unspecified: Secondary | ICD-10-CM | POA: Diagnosis not present

## 2017-10-18 DIAGNOSIS — M169 Osteoarthritis of hip, unspecified: Secondary | ICD-10-CM | POA: Diagnosis not present

## 2017-10-18 DIAGNOSIS — Z6834 Body mass index (BMI) 34.0-34.9, adult: Secondary | ICD-10-CM | POA: Diagnosis not present

## 2017-10-18 DIAGNOSIS — E039 Hypothyroidism, unspecified: Secondary | ICD-10-CM | POA: Diagnosis not present

## 2017-10-18 DIAGNOSIS — G9009 Other idiopathic peripheral autonomic neuropathy: Secondary | ICD-10-CM | POA: Diagnosis not present

## 2018-01-22 NOTE — Progress Notes (Signed)
Marland Kitchen    HEMATOLOGY/ONCOLOGY CLINIC NOTE  Date of Service: 01/23/18   PCP: Marzetta Board MD  CHIEF COMPLAINTS/PURPOSE OF CONSULTATION:  Continued f/u for smoldering myeloma  HISTORY OF PRESENTING ILLNESS:   Madison Parker is a wonderful 67 y.o. 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  Patient has a history of smoldering multiple myeloma which she reports she was diagnosed with in 2011 when she was evaluated by a hematologist in [place]. She reports that she presented with low blood counts (low WBC)   and after significant workup she had a bone marrow examination based on which she was told that she has smoldering multiple myeloma. Patient notes that she had a bone survey at that time which was negative. She was monitored there closely and then moved to [facility] in 2015 to stay with her son whose wife was being treated for breast cancer. Patient was following with Dr. Delight Hoh in [place]. She reports not having had any treatment for multiple myeloma.  Her last labs from about a year ago showed a SPEP with no OBSERVED M spike . Serum free light chains showed an elevation of kappa Free light chain 310 and lambda free light chain of 12.7 with an abnormal Kappa/ Lambda ratio of 24.38 ( up from 19 previously). Random urine showed an M protein complement of 44%.  She lives in [place] and requested transfer of care to [place]. She was offered follow-up but [facility] in [place]  But prefers to  follow [place] in [place]. Patient reports her energy levels been stable. She has chronic back pain which has improved since the patient had her spinal surgery in April 2017 (L4-L5 interbody fusion). Patient notes she has had some chronic left hip pain which is somewhat more bothersome with the navy on the lateral aspect of her upper thighs that's painful. She also notes some right hip pain.  Over the last few weeks he notes some upper neck pain especially when bending her neck backwards and tingling numbness in her right upper extremity.  Has been working on losing some weight voluntarily.  No acute other new symptoms.  INTERVAL HISTORY  Madison Parker is here for f/u for her smoldering multiple myeloma. The patient's last visit with [place] was on 09/30/17. She is accompanied today by her husband. The pt reports that she is doing well overall.   The pt reports no new concerns and has had no medication changes.   Lab results today (01/23/18) of CBC, CMP, and Reticulocytes is as follows: all values are WNL except for WBC at 3.4k, RBC at 3.58, Platelets at 142k.  MMP and Kappa/Lambda 01/23/18 are both pending.   On review of systems, pt reports stable weight, remaining active, and denies new bone pains, bowel irregularities, changes in appetite, pain along the spine, headaches, abdominal pain, leg swelling, and any other symptoms.  MEDICAL HISTORY:  Past Medical History:  Diagnosis Date  . Headache   . Low back pain   . SBO (small bowel obstruction) ([facility]) 2010  . Smoldering multiple myeloma ([facility])   Previous history of hypothyroidism- not currently medications Anxiety Obesity .Body mass index is 36.54 kg/m. Vitamin D deficiency Smoldering multiple myeloma diagnosed in 2011 Lumbosacral radiculopathy Left hip pain  SURGICAL HISTORY: Past Surgical History:  Procedure Laterality Date  . ABDOMINAL HYSTERECTOMY     complete  . BACK SURGERY  lower at [facility]  . colonscopy  2014  . GASTRIC BYPASS  yrs ago  . HERNIA REPAIR    . KNEE SURGERY  06/04/2009   both knees replaced   . TONSILLECTOMY  age 87  . TOTAL HIP ARTHROPLASTY Left 08/16/2017   Procedure: LEFT TOTAL HIP ARTHROPLASTY ANTERIOR APPROACH;  Surgeon: Dorna Leitz, MD;  Location: [facility];  Service: Orthopedics;  Laterality: Left;  Spinal surgery April 2017  SOCIAL HISTORY: Social History   Socioeconomic History    . Marital status: Married    Spouse name: Not on file  . Number of children: Not on file  . Years of education: Not on file  . Highest education level: Not on file  Occupational History  . Not on file  Social Needs  . Financial resource strain: Not on file  . Food insecurity:    Worry: Not on file    Inability: Not on file  . Transportation needs:    Medical: Not on file    Non-medical: Not on file  Tobacco Use  . Smoking status: Former Smoker    Packs/day: 0.50    Years: 10.00    Pack years: 5.00    Types: Cigarettes  . Smokeless tobacco: Never Used  . Tobacco comment: quit 1977  Substance and Sexual Activity  . Alcohol use: Yes    Comment: occ  . Drug use: No  . Sexual activity: Not on file  Lifestyle  . Physical activity:    Days per week: Not on file    Minutes per session: Not on file  . Stress: Not on file  Relationships  . Social connections:    Talks on phone: Not on file    Gets together: Not on file    Attends religious service: Not on file    Active member of club or organization: Not on file    Attends meetings of clubs or organizations: Not on file    Relationship status: Not on file  . Intimate partner violence:    Fear of current or ex partner: Not on file    Emotionally abused: Not on file    Physically abused: Not on file    Forced sexual activity: Not on file  Other Topics Concern  . Not on file  Social History Narrative  . Not on file  Occasional alcohol use Former smoker and smoked 1 pack per week for about 9 years has since quit.  FAMILY HISTORY: No family history on file.  ALLERGIES:  is allergic to codeine.  MEDICATIONS:  Current Outpatient Medications  Medication Sig Dispense Refill  . aspirin EC 325 MG tablet Take 1 tablet (325 mg total) by mouth 2 (two) times daily after a meal. Take x 1 month post op to decrease risk of blood clots. 60 tablet 0  . B Complex-C (B-COMPLEX WITH VITAMIN C) tablet Take 1 tablet by mouth daily.     . Calcium Carbonate-Vitamin D (CALCIUM 600+D) 600-200 MG-UNIT TABS Take 1 tablet by mouth daily.     . Cholecalciferol (VITAMIN D3) 2000 units TABS Take 2,000 Units by mouth daily.     . Potassium 99 MG TABS Take 99 mg by mouth daily.    . vitamin B-12 (CYANOCOBALAMIN) 1000 MCG tablet Take 1,000 mcg by mouth daily.     No current facility-administered medications for this visit.     REVIEW OF SYSTEMS:    10 Point review of Systems was done is negative except as noted above.  PHYSICAL EXAMINATION: ECOG PERFORMANCE STATUS: 1 - Symptomatic but completely ambulatory  . Vitals:   01/23/18 1458  BP: (!) 141/72  Pulse: 76  Resp: 18  Temp: 98.6 F (37 C)  SpO2: 100%   Filed Weights   01/23/18 1458  Weight: 206 lb 4.8 oz (93.6 kg)   .Body mass index is 36.54 kg/m.  GENERAL:alert, in no acute distress and comfortable SKIN: no acute rashes, no significant lesions EYES: conjunctiva are pink and non-injected, sclera anicteric OROPHARYNX: MMM, no exudates, no oropharyngeal erythema or ulceration NECK: supple, no JVD LYMPH:  no palpable lymphadenopathy in the cervical, axillary or inguinal regions LUNGS: clear to auscultation b/l with normal respiratory effort HEART: regular rate & rhythm ABDOMEN:  normoactive bowel sounds , non tender, not distended. Extremity: no pedal edema PSYCH: alert & oriented x 3 with fluent speech NEURO: no focal motor/sensory deficits   LABORATORY DATA:  I have reviewed the data as listed  . CBC Latest Ref Rng & Units 01/23/2018 09/30/2017 08/18/2017  WBC 3.9 - 10.3 K/uL 3.4(L) 3.0(L) 6.0  Hemoglobin 11.6 - 15.9 g/dL 11.6 11.3(L) 9.9(L)  Hematocrit 34.8 - 46.6 % 35.4 35.1 29.4(L)  Platelets 145 - 400 K/uL 142(L) 172 119(L)  ANC 1700 . CBC    Component Value Date/Time   WBC 3.4 (L) 01/23/2018 1335   WBC 3.0 (L) 09/30/2017 1145   WBC 6.0 08/18/2017 0610   RBC 3.58 (L) 01/23/2018 1335   RBC 3.58 (L) 01/23/2018 1335   HGB 11.3 (L) 09/30/2017  1145   HCT 35.4 01/23/2018 1335   HCT 35.1 09/30/2017 1145   PLT 142 (L) 01/23/2018 1335   PLT 172 09/30/2017 1145   MCV 98.9 01/23/2018 1335   MCV 97.5 09/30/2017 1145   MCH 32.4 01/23/2018 1335   MCHC 32.8 01/23/2018 1335   RDW 13.4 01/23/2018 1335   RDW 14.0 09/30/2017 1145   LYMPHSABS 1.4 01/23/2018 1335   LYMPHSABS 1.3 09/30/2017 1145   MONOABS 0.2 01/23/2018 1335   MONOABS 0.2 09/30/2017 1145   EOSABS 0.0 01/23/2018 1335   EOSABS 0.0 09/30/2017 1145   EOSABS 0.1 06/03/2014 1003   BASOSABS 0.0 01/23/2018 1335   BASOSABS 0.0 09/30/2017 1145     . CMP Latest Ref Rng & Units 01/23/2018 09/30/2017 09/30/2017  Glucose 70 - 140 mg/dL 87 82 -  BUN 7 - 26 mg/dL 23 18.9 -  Creatinine 0.60 - 1.10 mg/dL 0.96 0.8 -  Sodium 136 - 145 mmol/L 142 140 -  Potassium 3.5 - 5.1 mmol/L 3.9 3.9 -  Chloride 98 - 109 mmol/L 109 - -  CO2 22 - 29 mmol/L 23 23 -  Calcium 8.4 - 10.4 mg/dL 10.0 9.5 -  Total Protein 6.4 - 8.3 g/dL 7.2 7.4 6.8  Total Bilirubin 0.2 - 1.2 mg/dL 0.6 0.63 -  Alkaline Phos 40 - 150 U/L 114 134 -  AST 5 - 34 U/L 27 19 -  ALT 0 - 55 U/L 21 16 -   Component     Latest Ref Rng & Units 11/08/2016  LDH     125 - 245 U/L 220  Beta 2     0.6 - 2.4 mg/L 2.5 (H)   Component     Latest Ref Rng & Units 07/30/2017  Ferritin     9 - 269 ng/ml 81  Vitamin B12     232 - 1,245 pg/mL 594           RADIOGRAPHIC STUDIES: I have personally  reviewed the radiological images as listed and agreed with the findings in the report. No results found.   Bone survey 11/08/2016 IMPRESSION: 1. Small lytic lesion in the right scapula. 2. Possible small lytic lesion in the left scapula. 3. Postoperative changes. 4. Significant degenerative changes in both hips, left greater than right. 5. No evidence for acute fracture   Electronically Signed   By: Nolon Nations M.D.   On: 11/08/2016 16:19   ASSESSMENT & PLAN:   67 year old very pleasant lady with history of   1)  Smoldering Multiple Myeloma vs Multiple myeloma (concern for small lytic lesions in left and right scapulae) - (appears light chain producing) This was apparently diagnosed in 2011 by her hematologist in [place]. Patient has some mild stable anemia with a hemoglobin of 11.2 today  No renal failure/hypercalcemia. Bone survey with concern fr possible small lytic lesions in B/L scapulae. PET/CT scan on 03/12/2017 showed no concerning bone lesions. Bm Bx with 17% kappa restricted plasma cells consistent with plasma cell neoplasm. No treatment so far.  2) IgG Lambda M spike of 0.2g/dl on labs today - stable. 3)Stable Kappa Lambda SFLC with increased K/L ratio 4) Chronic leukopenia - ? Related to SMM vs other nutritional deficiencies (h/o gastric bypass surgery put her at risk for nutritional deficiencies) B12 levels WNL Ferritin adequate ?additional factor -recent NSAID use. ?element of benign ethnic neutropenia. Has not had any issues with frequent infection. Anticipate will rise appropriately with surgery. ANC post-operative normalized today at 1700 PLAN  -Discussed pt labwork today; blood counts and chemistries are all stable and improved.  -MMP and Kappa/lambda are stable and not suggestive of overt progression -Will see pt back in 4 months  --She was recommended to take her B complex and B12 regularly. -reasonable to take Iron polysaccharide 14m po daily to maintain Iron stores given anticipated blood loss with surgery. -Follow-up and optimize treatment for hypothyroidism with primary care physician.  3) Neuropathy/radiculopathy -continue on her gabapentin 300 mg by mouth at bedtime -Tramadol 50 mg by mouth every 6 hours as needed for her left hip arthritis pain and neuropathy/radiculopathy.   RTC with Dr kIrene Limbowith labs in 4 months   All of the patients questions were answered with apparent satisfaction. The patient knows to call the clinic with any problems, questions or  concerns.  . The total time spent in the appointment was 15 minutes and more than 50% was on counseling and direct patient cares.    GSullivan LoneMD MSiglervilleAAHIVMS SGrace Medical CenterCEncompass Health Rehabilitation Hospital The WoodlandsHematology/Oncology Physician [place]ah[place] (Office):       [phone](Work cell):  [phone](Fax):           [phone] This document serves as a record of services personally performed by GSullivan Lone MD. It was created on his behalf by SBaldwin Jamaica a trained medical scribe. The creation of this record is based on the scribe's personal observations and the provider's statements to them.   .I have reviewed the above documentation for accuracy and completeness, and I agree with the above. .Brunetta GeneraMD

## 2018-01-23 ENCOUNTER — Telehealth: Payer: Self-pay

## 2018-01-23 ENCOUNTER — Inpatient Hospital Stay (HOSPITAL_BASED_OUTPATIENT_CLINIC_OR_DEPARTMENT_OTHER): Payer: Medicare Other | Admitting: Hematology

## 2018-01-23 ENCOUNTER — Inpatient Hospital Stay: Payer: Medicare Other | Attending: Hematology

## 2018-01-23 ENCOUNTER — Other Ambulatory Visit: Payer: Self-pay | Admitting: Internal Medicine

## 2018-01-23 ENCOUNTER — Encounter: Payer: Self-pay | Admitting: Hematology

## 2018-01-23 VITALS — BP 141/72 | HR 76 | Temp 98.6°F | Resp 18 | Ht 63.0 in | Wt 206.3 lb

## 2018-01-23 DIAGNOSIS — C9000 Multiple myeloma not having achieved remission: Secondary | ICD-10-CM | POA: Insufficient documentation

## 2018-01-23 DIAGNOSIS — D472 Monoclonal gammopathy: Secondary | ICD-10-CM

## 2018-01-23 DIAGNOSIS — Z1231 Encounter for screening mammogram for malignant neoplasm of breast: Secondary | ICD-10-CM

## 2018-01-23 DIAGNOSIS — D649 Anemia, unspecified: Secondary | ICD-10-CM

## 2018-01-23 DIAGNOSIS — E538 Deficiency of other specified B group vitamins: Secondary | ICD-10-CM

## 2018-01-23 LAB — COMPREHENSIVE METABOLIC PANEL
ALK PHOS: 114 U/L (ref 40–150)
ALT: 21 U/L (ref 0–55)
AST: 27 U/L (ref 5–34)
Albumin: 4.1 g/dL (ref 3.5–5.0)
Anion gap: 10 (ref 3–11)
BUN: 23 mg/dL (ref 7–26)
CHLORIDE: 109 mmol/L (ref 98–109)
CO2: 23 mmol/L (ref 22–29)
CREATININE: 0.96 mg/dL (ref 0.60–1.10)
Calcium: 10 mg/dL (ref 8.4–10.4)
Glucose, Bld: 87 mg/dL (ref 70–140)
Potassium: 3.9 mmol/L (ref 3.5–5.1)
Sodium: 142 mmol/L (ref 136–145)
Total Bilirubin: 0.6 mg/dL (ref 0.2–1.2)
Total Protein: 7.2 g/dL (ref 6.4–8.3)

## 2018-01-23 LAB — CBC WITH DIFFERENTIAL (CANCER CENTER ONLY)
Basophils Absolute: 0 10*3/uL (ref 0.0–0.1)
Basophils Relative: 0 %
EOS ABS: 0 10*3/uL (ref 0.0–0.5)
EOS PCT: 1 %
HCT: 35.4 % (ref 34.8–46.6)
Hemoglobin: 11.6 g/dL (ref 11.6–15.9)
LYMPHS ABS: 1.4 10*3/uL (ref 0.9–3.3)
Lymphocytes Relative: 43 %
MCH: 32.4 pg (ref 25.1–34.0)
MCHC: 32.8 g/dL (ref 31.5–36.0)
MCV: 98.9 fL (ref 79.5–101.0)
MONOS PCT: 6 %
Monocytes Absolute: 0.2 10*3/uL (ref 0.1–0.9)
Neutro Abs: 1.7 10*3/uL (ref 1.5–6.5)
Neutrophils Relative %: 50 %
PLATELETS: 142 10*3/uL — AB (ref 145–400)
RBC: 3.58 MIL/uL — AB (ref 3.70–5.45)
RDW: 13.4 % (ref 11.2–14.5)
WBC: 3.4 10*3/uL — AB (ref 3.9–10.3)
nRBC: 1 /100 WBC — ABNORMAL HIGH

## 2018-01-23 LAB — RETICULOCYTES
RBC.: 3.58 MIL/uL — ABNORMAL LOW (ref 3.70–5.45)
RETIC CT PCT: 2 % (ref 0.7–2.1)
Retic Count, Absolute: 71.6 10*3/uL (ref 33.7–90.7)

## 2018-01-23 NOTE — Telephone Encounter (Signed)
Printed avs and calender of upcoming appointments. Per 4/11 los

## 2018-01-23 NOTE — Patient Instructions (Signed)
Thank you for choosing [facility] to provide your oncology and hematology care.  To afford each patient quality time with our providers, please arrive 30 minutes before your scheduled appointment time.  If you arrive late for your appointment, you may be asked to reschedule.  We strive to give you quality time with our providers, and arriving late affects you and other patients whose appointments are after yours.   If you are a no show for multiple scheduled visits, you may be dismissed from the clinic at the providers discretion.    Again, thank you for choosing [facility], our hope is that these requests will decrease the amount of time that you wait before being seen by our physicians.  ______________________________________________________________________  Should you have questions after your visit to the [facility], please contact our office at [phone] between the hours of 8:30 and 4:30 p.m.    Voicemails left after 4:30p.m will not be returned until the following business day.    For prescription refill requests, please have your pharmacy contact us directly.  Please also try to allow 48 hours for prescription requests.    Please contact the scheduling department for questions regarding scheduling.  For scheduling of procedures such as PET scans, CT scans, MRI, Ultrasound, etc please contact central scheduling at [phone].    Resources For Cancer Patients and Caregivers:   Oncolink.org:  A wonderful resource for patients and healthcare providers for information regarding your disease, ways to tract your treatment, what to expect, etc.     American Cancer Society:  800-227-2345  Can help patients locate various types of support and financial assistance  Cancer Care: 1-800-813-HOPE (4673) Provides financial assistance, online support groups, medication/co-pay assistance.    [place] DSS:  336-641-3447 Where to apply for food  stamps, Medicaid, and utility assistance  Medicare Rights Center: 800-333-4114 Helps people with Medicare understand their rights and benefits, navigate the Medicare system, and secure the quality healthcare they deserve  SCAT: 336-333-6589 [place] [employer]'s shared-ride transportation service for eligible riders who have a disability that prevents them from riding the fixed route bus.    For additional information on assistance programs please contact our social worker:   Grier Hock/Abigail Elmore:  [phone]            

## 2018-01-24 LAB — KAPPA/LAMBDA LIGHT CHAINS
Kappa free light chain: 321.3 mg/L — ABNORMAL HIGH (ref 3.3–19.4)
Kappa, lambda light chain ratio: 23.45 — ABNORMAL HIGH (ref 0.26–1.65)
LAMDA FREE LIGHT CHAINS: 13.7 mg/L (ref 5.7–26.3)

## 2018-01-27 LAB — MULTIPLE MYELOMA PANEL, SERUM
ALBUMIN SERPL ELPH-MCNC: 3.9 g/dL (ref 2.9–4.4)
ALPHA 1: 0.2 g/dL (ref 0.0–0.4)
ALPHA2 GLOB SERPL ELPH-MCNC: 0.6 g/dL (ref 0.4–1.0)
Albumin/Glob SerPl: 1.4 (ref 0.7–1.7)
B-GLOBULIN SERPL ELPH-MCNC: 1.2 g/dL (ref 0.7–1.3)
Gamma Glob SerPl Elph-Mcnc: 0.9 g/dL (ref 0.4–1.8)
Globulin, Total: 2.8 g/dL (ref 2.2–3.9)
IGG (IMMUNOGLOBIN G), SERUM: 815 mg/dL (ref 700–1600)
IgA: 60 mg/dL — ABNORMAL LOW (ref 87–352)
IgM (Immunoglobulin M), Srm: 39 mg/dL (ref 26–217)
M PROTEIN SERPL ELPH-MCNC: 0.2 g/dL — AB
TOTAL PROTEIN ELP: 6.7 g/dL (ref 6.0–8.5)

## 2018-01-28 ENCOUNTER — Ambulatory Visit: Payer: Medicare Other | Admitting: Hematology

## 2018-01-28 ENCOUNTER — Other Ambulatory Visit: Payer: Medicare Other

## 2018-02-20 ENCOUNTER — Ambulatory Visit
Admission: RE | Admit: 2018-02-20 | Discharge: 2018-02-20 | Disposition: A | Discharge status: Home / Self Care | Payer: Medicare Other | Source: Ambulatory Visit | Attending: Internal Medicine | Admitting: Internal Medicine

## 2018-02-20 DIAGNOSIS — Z1231 Encounter for screening mammogram for malignant neoplasm of breast: Secondary | ICD-10-CM | POA: Diagnosis not present

## 2018-04-12 DIAGNOSIS — N390 Urinary tract infection, site not specified: Secondary | ICD-10-CM | POA: Diagnosis not present

## 2018-04-21 DIAGNOSIS — C9000 Multiple myeloma not having achieved remission: Secondary | ICD-10-CM | POA: Diagnosis not present

## 2018-04-21 DIAGNOSIS — E7800 Pure hypercholesterolemia, unspecified: Secondary | ICD-10-CM | POA: Diagnosis not present

## 2018-04-21 DIAGNOSIS — Z0000 Encounter for general adult medical examination without abnormal findings: Secondary | ICD-10-CM | POA: Diagnosis not present

## 2018-04-21 DIAGNOSIS — E039 Hypothyroidism, unspecified: Secondary | ICD-10-CM | POA: Diagnosis not present

## 2018-04-24 DIAGNOSIS — M1612 Unilateral primary osteoarthritis, left hip: Secondary | ICD-10-CM | POA: Diagnosis not present

## 2018-04-24 DIAGNOSIS — Z96642 Presence of left artificial hip joint: Secondary | ICD-10-CM | POA: Diagnosis not present

## 2018-04-24 DIAGNOSIS — M545 Low back pain: Secondary | ICD-10-CM | POA: Diagnosis not present

## 2018-04-28 DIAGNOSIS — C9000 Multiple myeloma not having achieved remission: Secondary | ICD-10-CM | POA: Diagnosis not present

## 2018-04-28 DIAGNOSIS — M25559 Pain in unspecified hip: Secondary | ICD-10-CM | POA: Diagnosis not present

## 2018-04-28 DIAGNOSIS — Z6836 Body mass index (BMI) 36.0-36.9, adult: Secondary | ICD-10-CM | POA: Diagnosis not present

## 2018-04-28 DIAGNOSIS — E039 Hypothyroidism, unspecified: Secondary | ICD-10-CM | POA: Diagnosis not present

## 2018-04-28 DIAGNOSIS — G9009 Other idiopathic peripheral autonomic neuropathy: Secondary | ICD-10-CM | POA: Diagnosis not present

## 2018-05-21 NOTE — Progress Notes (Signed)
Marland Kitchen    HEMATOLOGY/ONCOLOGY CLINIC NOTE  Date of Service: 05/22/18     PCP: Marzetta Board MD  CHIEF COMPLAINTS/PURPOSE OF CONSULTATION:  Continued f/u for smoldering myeloma  HISTORY OF PRESENTING ILLNESS:   Madison Parker is a wonderful 67 y.o. 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  Patient has a history of smoldering multiple myeloma which she reports she was diagnosed with in 2011 when she was evaluated by a hematologist in [place]. She reports that she presented with low blood counts (low WBC)   and after significant workup she had a bone marrow examination based on which she was told that she has smoldering multiple myeloma. Patient notes that she had a bone survey at that time which was negative. She was monitored there closely and then moved to [facility] in 2015 to stay with her son whose wife was being treated for breast cancer. Patient was following with Dr. Delight Hoh in [place]. She reports not having had any treatment for multiple myeloma.  Her last labs from about a year ago showed a SPEP with no OBSERVED M spike . Serum free light chains showed an elevation of kappa Free light chain 310 and lambda free light chain of 12.7 with an abnormal Kappa/ Lambda ratio of 24.38 ( up from 19 previously). Random urine showed an M protein complement of 44%.  She lives in [place] and requested transfer of care to [place]. She was offered follow-up but [facility] in [place]  But prefers to  follow [place] in [place]. Patient reports her energy levels been stable. She has chronic back pain which has improved since the patient had her spinal surgery in April 2017 (L4-L5 interbody fusion). Patient notes she has had some chronic left hip pain which is somewhat more bothersome with the navy on the lateral aspect of her upper thighs that's painful. She also notes some right hip  pain. Over the last few weeks he notes some upper neck pain especially when bending her neck backwards and tingling numbness in her right upper extremity.  Has been working on losing some weight voluntarily.  No acute other new symptoms.  INTERVAL HISTORY  Madison Parker is here for f/u for her smoldering multiple myeloma. The patient's last visit with [place] was on 01/23/18. She is accompanied today by her husband. The pt reports that she is doing well overall.   The pt reports that she has had a recent UTI and finished an antibiotic course successfully. She notes that she has developed a new pain in her mid to lower back that has not dissipated since presenting a couple weeks ago. She notes that the pain is muscular and wraps around her right side a little bit. She attributes this to her age.   She hasn't developed any constitutional symptoms in the interim, or new bone pains.   Lab results today (05/22/18) of CBC w/diff and Reticulocytes is as follows: all values are WNL except for WBC at 2.7k, RBC at 3.38, HGB at 11.1, HCT at 33.8, PLT at 120k, ANC at 1.3k. 05/22/18 CMP, MMP, and Kappa/Lambda are pending.   On review of systems, pt reports stable joint pains, good energy levels, resolved UTI, back muscle pain, and denies fevers, chills, night sweats, unexpected weight loss, new bone pains, abdominal pains, and any other symptoms.   MEDICAL HISTORY:  Past Medical History:  Diagnosis Date  . Headache   .  Low back pain   . SBO (small bowel obstruction) ([facility]) 2010  . Smoldering multiple myeloma ([facility])   Previous history of hypothyroidism- not currently medications Anxiety Obesity .There is no height or weight on file to calculate BMI. Vitamin D deficiency Smoldering multiple myeloma diagnosed in 2011 Lumbosacral radiculopathy Left hip pain  SURGICAL HISTORY: Past Surgical History:  Procedure Laterality Date  . ABDOMINAL HYSTERECTOMY     complete  . BACK SURGERY     lower at [facility]  .  colonscopy  2014  . GASTRIC BYPASS  yrs ago  . HERNIA REPAIR    . KNEE SURGERY  06/04/2009   both knees replaced   . TONSILLECTOMY  age 67  . TOTAL HIP ARTHROPLASTY Left 08/16/2017   Procedure: LEFT TOTAL HIP ARTHROPLASTY ANTERIOR APPROACH;  Surgeon: Dorna Leitz, MD;  Location: [facility];  Service: Orthopedics;  Laterality: Left;  Spinal surgery April 2017  SOCIAL HISTORY: Social History   Socioeconomic History  . Marital status: Married    Spouse name: Not on file  . Number of children: Not on file  . Years of education: Not on file  . Highest education level: Not on file  Occupational History  . Not on file  Social Needs  . Financial resource strain: Not on file  . Food insecurity:    Worry: Not on file    Inability: Not on file  . Transportation needs:    Medical: Not on file    Non-medical: Not on file  Tobacco Use  . Smoking status: Former Smoker    Packs/day: 0.50    Years: 10.00    Pack years: 5.00    Types: Cigarettes  . Smokeless tobacco: Never Used  . Tobacco comment: quit 1977  Substance and Sexual Activity  . Alcohol use: Yes    Comment: occ  . Drug use: No  . Sexual activity: Not on file  Lifestyle  . Physical activity:    Days per week: Not on file    Minutes per session: Not on file  . Stress: Not on file  Relationships  . Social connections:    Talks on phone: Not on file    Gets together: Not on file    Attends religious service: Not on file    Active member of club or organization: Not on file    Attends meetings of clubs or organizations: Not on file    Relationship status: Not on file  . Intimate partner violence:    Fear of current or ex partner: Not on file    Emotionally abused: Not on file    Physically abused: Not on file    Forced sexual activity: Not on file  Other Topics Concern  . Not on file  Social History Narrative  . Not on file  Occasional alcohol use Former smoker and smoked 1 pack per week for about 9 years has since  quit.  FAMILY HISTORY: No family history on file.  ALLERGIES:  is allergic to codeine.  MEDICATIONS:  Current Outpatient Medications  Medication Sig Dispense Refill  . B Complex-C (B-COMPLEX WITH VITAMIN C) tablet Take 1 tablet by mouth daily.    . Calcium Carbonate-Vitamin D (CALCIUM 600+D) 600-200 MG-UNIT TABS Take 1 tablet by mouth daily.     . Cholecalciferol (VITAMIN D3) 2000 units TABS Take 2,000 Units by mouth daily.     . Potassium 99 MG TABS Take 99 mg by mouth daily.    . vitamin B-12 (CYANOCOBALAMIN) 1000  MCG tablet Take 5,000 mcg by mouth daily.      No current facility-administered medications for this visit.     REVIEW OF SYSTEMS:    A 10+ POINT REVIEW OF SYSTEMS WAS OBTAINED including neurology, dermatology, psychiatry, cardiac, respiratory, lymph, extremities, GI, GU, Musculoskeletal, constitutional, breasts, reproductive, HEENT.  All pertinent positives are noted in the HPI.  All others are negative.   PHYSICAL EXAMINATION: ECOG PERFORMANCE STATUS: 1 - Symptomatic but completely ambulatory  . There were no vitals filed for this visit. There were no vitals filed for this visit. .There is no height or weight on file to calculate BMI.  GENERAL:alert, in no acute distress and comfortable SKIN: no acute rashes, no significant lesions EYES: conjunctiva are pink and non-injected, sclera anicteric OROPHARYNX: MMM, no exudates, no oropharyngeal erythema or ulceration NECK: supple, no JVD LYMPH:  no palpable lymphadenopathy in the cervical, axillary or inguinal regions LUNGS: clear to auscultation b/l with normal respiratory effort HEART: regular rate & rhythm ABDOMEN:  normoactive bowel sounds , non tender, not distended. No palpable hepatosplenomegaly.  Extremity: no pedal edema PSYCH: alert & oriented x 3 with fluent speech NEURO: no focal motor/sensory deficits   LABORATORY DATA:  I have reviewed the data as listed  . CBC Latest Ref Rng & Units 05/22/2018  01/23/2018 09/30/2017  WBC 3.9 - 10.3 K/uL 2.7(L) 3.4(L) 3.0(L)  Hemoglobin 11.6 - 15.9 g/dL 11.1(L) 11.6 11.3(L)  Hematocrit 34.8 - 46.6 % 33.8(L) 35.4 35.1  Platelets 145 - 400 K/uL 120(L) 142(L) 172  ANC 1300 . CBC    Component Value Date/Time   WBC 2.7 (L) 05/22/2018 1142   WBC 3.0 (L) 09/30/2017 1145   WBC 6.0 08/18/2017 0610   RBC 3.38 (L) 05/22/2018 1142   RBC 3.38 (L) 05/22/2018 1142   HGB 11.1 (L) 05/22/2018 1142   HGB 11.3 (L) 09/30/2017 1145   HCT 33.8 (L) 05/22/2018 1142   HCT 35.1 09/30/2017 1145   PLT 120 (L) 05/22/2018 1142   PLT 172 09/30/2017 1145   MCV 100.0 05/22/2018 1142   MCV 97.5 09/30/2017 1145   MCH 32.8 05/22/2018 1142   MCHC 32.8 05/22/2018 1142   RDW 12.7 05/22/2018 1142   RDW 14.0 09/30/2017 1145   LYMPHSABS 1.2 05/22/2018 1142   LYMPHSABS 1.3 09/30/2017 1145   MONOABS 0.2 05/22/2018 1142   MONOABS 0.2 09/30/2017 1145   EOSABS 0.0 05/22/2018 1142   EOSABS 0.0 09/30/2017 1145   EOSABS 0.1 06/03/2014 1003   BASOSABS 0.0 05/22/2018 1142   BASOSABS 0.0 09/30/2017 1145     . CMP Latest Ref Rng & Units 05/22/2018 01/23/2018 09/30/2017  Glucose 70 - 99 mg/dL 95 87 82  BUN 8 - 23 mg/dL 18 23 18.9  Creatinine 0.44 - 1.00 mg/dL 0.82 0.96 0.8  Sodium 135 - 145 mmol/L 140 142 140  Potassium 3.5 - 5.1 mmol/L 4.1 3.9 3.9  Chloride 98 - 111 mmol/L 107 109 -  CO2 22 - 32 mmol/L _0 Calcium 8.9 - 10.3 mg/dL 9.8 10.0 9.5  Total Protein 6.5 - 8.1 g/dL 7.2 7.2 7.4  Total Bilirubin 0.3 - 1.2 mg/dL 1.1 0.6 0.63  Alkaline Phos 38 - 126 U/L 100 114 134  AST 15 - 41 U/L _1 ALT 0 - 44 U/L _2 Component     Latest Ref Rng & Units 11/08/2016  LDH     125 - 245 U/L 220  Beta 2  0.6 - 2.4 mg/L 2.5 (H)   Component     Latest Ref Rng & Units 07/30/2017  Ferritin     9 - 269 ng/ml 81  Vitamin B12     232 - 1,245 pg/mL 594         RADIOGRAPHIC STUDIES: I have personally reviewed the radiological images as listed and agreed with  the findings in the report. No results found.   Bone survey 11/08/2016 IMPRESSION: 1. Small lytic lesion in the right scapula. 2. Possible small lytic lesion in the left scapula. 3. Postoperative changes. 4. Significant degenerative changes in both hips, left greater than right. 5. No evidence for acute fracture   Electronically Signed   By: Nolon Nations M.D.   On: 11/08/2016 16:19   ASSESSMENT & PLAN:   67 y.o. very pleasant lady with history of   1) Smoldering Multiple Myeloma vs Multiple myeloma (concern for small lytic lesions in left and right scapulae) - (appears light chain producing) This was apparently diagnosed in 2011 by her hematologist in [place]. Patient has some mild stable anemia with a hemoglobin of 11.2 today  No renal failure/hypercalcemia. Bone survey with concern fr possible small lytic lesions in B/L scapulae. PET/CT scan on 03/12/2017 showed no concerning bone lesions. Bm Bx with 17% kappa restricted plasma cells consistent with plasma cell neoplasm. No treatment so far.  2) IgG Lambda M spike of 0.2g/dl on labs today - stable. 3)Stable Kappa Lambda SFLC with increased K/L ratio - gradual increase 4) Chronic leukopenia - ? Related to SMM vs other nutritional deficiencies (h/o gastric bypass surgery put her at risk for nutritional deficiencies) B12 levels WNL Ferritin adequate ?additional factor -recent NSAID use. ?element of benign ethnic neutropenia. Has not had any issues with frequent infection.  ANC post-operative normalized today at 1700  PLAN:  -Discussed pt labwork today, 05/22/18; HGB slightly decreased to 11.1, PLT decreased to 120k, ANC decreased slightly to 1.3k. -Chemistries, Kappa/Lambda, and MMP reviewed as noted above -No new bone pains -The pt shows no clinical or lab progression of smoldering Myeloma at this time.  -No indication for indicating active treatment at this time. -Continue with Iron Polysaccharide, Vitamin B  complex and Vitamin B12 -Will see pt back in 4 months   3) Neuropathy/radiculopathy -continue on her gabapentin 300 mg by mouth at bedtime -Tramadol 50 mg by mouth every 6 hours as needed for her left hip arthritis pain and neuropathy/radiculopathy.   RTC with Dr Irene Limbo in 4 months with labs (Plz schedule labs 1 week prior to clinic visit)   All of the patients questions were answered with apparent satisfaction. The patient knows to call the clinic with any problems, questions or concerns.  The total time spent in the appt was 25 minutes and more than 50% was on counseling and direct patient cares and communicating with PCP.     Sullivan Lone MD MS AAHIVMS [facility] Hematology/Oncology Physician [facility]  (Office):       [phone] (Work cell):  [phone] (Fax):           [phone]  I, Baldwin Jamaica, am acting as a scribe for Dr. Irene Limbo  .I have reviewed the above documentation for accuracy and completeness, and I agree with the above. Brunetta Genera MD

## 2018-05-22 ENCOUNTER — Inpatient Hospital Stay: Payer: Medicare Other | Attending: Hematology | Admitting: Hematology

## 2018-05-22 ENCOUNTER — Inpatient Hospital Stay: Payer: Medicare Other

## 2018-05-22 ENCOUNTER — Encounter: Payer: Self-pay | Admitting: Hematology

## 2018-05-22 ENCOUNTER — Telehealth: Payer: Self-pay | Admitting: Hematology

## 2018-05-22 DIAGNOSIS — Z9884 Bariatric surgery status: Secondary | ICD-10-CM | POA: Diagnosis not present

## 2018-05-22 DIAGNOSIS — C9000 Multiple myeloma not having achieved remission: Secondary | ICD-10-CM

## 2018-05-22 DIAGNOSIS — G629 Polyneuropathy, unspecified: Secondary | ICD-10-CM | POA: Diagnosis not present

## 2018-05-22 DIAGNOSIS — D472 Monoclonal gammopathy: Secondary | ICD-10-CM

## 2018-05-22 DIAGNOSIS — M5410 Radiculopathy, site unspecified: Secondary | ICD-10-CM | POA: Diagnosis not present

## 2018-05-22 LAB — CMP (CANCER CENTER ONLY)
ALT: 23 U/L (ref 0–44)
ANION GAP: 11 (ref 5–15)
AST: 27 U/L (ref 15–41)
Albumin: 4.2 g/dL (ref 3.5–5.0)
Alkaline Phosphatase: 100 U/L (ref 38–126)
BILIRUBIN TOTAL: 1.1 mg/dL (ref 0.3–1.2)
BUN: 18 mg/dL (ref 8–23)
CO2: 22 mmol/L (ref 22–32)
Calcium: 9.8 mg/dL (ref 8.9–10.3)
Chloride: 107 mmol/L (ref 98–111)
Creatinine: 0.82 mg/dL (ref 0.44–1.00)
GFR, Est AFR Am: >60 mL/min (ref >60)
Glucose, Bld: 95 mg/dL (ref 70–99)
POTASSIUM: 4.1 mmol/L (ref 3.5–5.1)
Sodium: 140 mmol/L (ref 135–145)
TOTAL PROTEIN: 7.2 g/dL (ref 6.5–8.1)

## 2018-05-22 LAB — CBC WITH DIFFERENTIAL (CANCER CENTER ONLY)
BASOS ABS: 0 10*3/uL (ref 0.0–0.1)
Basophils Relative: 0 %
EOS PCT: 0 %
Eosinophils Absolute: 0 10*3/uL (ref 0.0–0.5)
HEMATOCRIT: 33.8 % — AB (ref 34.8–46.6)
Hemoglobin: 11.1 g/dL — ABNORMAL LOW (ref 11.6–15.9)
Lymphocytes Relative: 45 %
Lymphs Abs: 1.2 10*3/uL (ref 0.9–3.3)
MCH: 32.8 pg (ref 25.1–34.0)
MCHC: 32.8 g/dL (ref 31.5–36.0)
MCV: 100 fL (ref 79.5–101.0)
Monocytes Absolute: 0.2 10*3/uL (ref 0.1–0.9)
Monocytes Relative: 7 %
NEUTROS ABS: 1.3 10*3/uL — AB (ref 1.5–6.5)
Neutrophils Relative %: 48 %
PLATELETS: 120 10*3/uL — AB (ref 145–400)
RBC: 3.38 MIL/uL — AB (ref 3.70–5.45)
RDW: 12.7 % (ref 11.2–14.5)
WBC: 2.7 10*3/uL — AB (ref 3.9–10.3)

## 2018-05-22 LAB — RETICULOCYTES
RBC.: 3.38 MIL/uL — AB (ref 3.70–5.45)
RETIC COUNT ABSOLUTE: 57.5 10*3/uL (ref 33.7–90.7)
Retic Ct Pct: 1.7 % (ref 0.7–2.1)

## 2018-05-22 NOTE — Telephone Encounter (Signed)
Scheduled appt per 8/8 los - pt aware - per patient no print out wanted.

## 2018-05-23 LAB — KAPPA/LAMBDA LIGHT CHAINS
KAPPA, LAMDA LIGHT CHAIN RATIO: 31.48 — AB (ref 0.26–1.65)
Kappa free light chain: 358.9 mg/L — ABNORMAL HIGH (ref 3.3–19.4)
LAMDA FREE LIGHT CHAINS: 11.4 mg/L (ref 5.7–26.3)

## 2018-05-24 LAB — MULTIPLE MYELOMA PANEL, SERUM
ALBUMIN/GLOB SERPL: 1.4 (ref 0.7–1.7)
ALPHA2 GLOB SERPL ELPH-MCNC: 0.7 g/dL (ref 0.4–1.0)
Albumin SerPl Elph-Mcnc: 3.8 g/dL (ref 2.9–4.4)
Alpha 1: 0.2 g/dL (ref 0.0–0.4)
B-GLOBULIN SERPL ELPH-MCNC: 1.2 g/dL (ref 0.7–1.3)
Gamma Glob SerPl Elph-Mcnc: 0.9 g/dL (ref 0.4–1.8)
Globulin, Total: 2.9 g/dL (ref 2.2–3.9)
IGG (IMMUNOGLOBIN G), SERUM: 855 mg/dL (ref 700–1600)
IgA: 57 mg/dL — ABNORMAL LOW (ref 87–352)
IgM (Immunoglobulin M), Srm: 36 mg/dL (ref 26–217)
M PROTEIN SERPL ELPH-MCNC: 0.2 g/dL — AB
Total Protein ELP: 6.7 g/dL (ref 6.0–8.5)

## 2018-08-14 DIAGNOSIS — Z8579 Personal history of other malignant neoplasms of lymphoid, hematopoietic and related tissues: Secondary | ICD-10-CM | POA: Diagnosis not present

## 2018-08-14 DIAGNOSIS — M799 Soft tissue disorder, unspecified: Secondary | ICD-10-CM | POA: Diagnosis not present

## 2018-08-15 ENCOUNTER — Ambulatory Visit (HOSPITAL_COMMUNITY)
Admission: RE | Admit: 2018-08-15 | Discharge: 2018-08-15 | Disposition: A | Discharge status: Home / Self Care | Payer: Medicare Other | Source: Ambulatory Visit | Attending: Nurse Practitioner | Admitting: Nurse Practitioner

## 2018-08-15 ENCOUNTER — Other Ambulatory Visit (HOSPITAL_COMMUNITY): Payer: Self-pay | Admitting: Nurse Practitioner

## 2018-08-15 DIAGNOSIS — R1900 Intra-abdominal and pelvic swelling, mass and lump, unspecified site: Secondary | ICD-10-CM

## 2018-08-15 DIAGNOSIS — R109 Unspecified abdominal pain: Secondary | ICD-10-CM | POA: Diagnosis not present

## 2018-08-15 DIAGNOSIS — R1013 Epigastric pain: Secondary | ICD-10-CM

## 2018-09-15 ENCOUNTER — Inpatient Hospital Stay: Payer: Medicare Other | Attending: Hematology

## 2018-09-15 DIAGNOSIS — C9000 Multiple myeloma not having achieved remission: Secondary | ICD-10-CM | POA: Diagnosis not present

## 2018-09-15 DIAGNOSIS — D472 Monoclonal gammopathy: Secondary | ICD-10-CM

## 2018-09-15 LAB — CBC WITH DIFFERENTIAL/PLATELET
Abs Immature Granulocytes: 0.01 10*3/uL (ref 0.00–0.07)
Basophils Absolute: 0 10*3/uL (ref 0.0–0.1)
Basophils Relative: 0 %
Eosinophils Absolute: 0 10*3/uL (ref 0.0–0.5)
Eosinophils Relative: 1 %
HCT: 31.9 % — ABNORMAL LOW (ref 36.0–46.0)
Hemoglobin: 10.4 g/dL — ABNORMAL LOW (ref 12.0–15.0)
Immature Granulocytes: 0 %
LYMPHS PCT: 42 %
Lymphs Abs: 1.1 10*3/uL (ref 0.7–4.0)
MCH: 32.8 pg (ref 26.0–34.0)
MCHC: 32.6 g/dL (ref 30.0–36.0)
MCV: 100.6 fL — AB (ref 80.0–100.0)
Monocytes Absolute: 0.1 10*3/uL (ref 0.1–1.0)
Monocytes Relative: 5 %
Neutro Abs: 1.3 10*3/uL — ABNORMAL LOW (ref 1.7–7.7)
Neutrophils Relative %: 52 %
Platelets: 119 10*3/uL — ABNORMAL LOW (ref 150–400)
RBC: 3.17 MIL/uL — ABNORMAL LOW (ref 3.87–5.11)
RDW: 12.9 % (ref 11.5–15.5)
WBC: 2.6 10*3/uL — ABNORMAL LOW (ref 4.0–10.5)
nRBC: 0 % (ref 0.0–0.2)

## 2018-09-15 LAB — CMP (CANCER CENTER ONLY)
ALBUMIN: 3.8 g/dL (ref 3.5–5.0)
ALK PHOS: 81 U/L (ref 38–126)
ALT: 24 U/L (ref 0–44)
ANION GAP: 6 (ref 5–15)
AST: 25 U/L (ref 15–41)
BILIRUBIN TOTAL: 0.8 mg/dL (ref 0.3–1.2)
BUN: 12 mg/dL (ref 8–23)
CALCIUM: 8.9 mg/dL (ref 8.9–10.3)
CO2: 24 mmol/L (ref 22–32)
Chloride: 113 mmol/L — ABNORMAL HIGH (ref 98–111)
Creatinine: 0.94 mg/dL (ref 0.44–1.00)
GFR, Est AFR Am: >60 mL/min (ref >60)
GFR, Estimated: >60 mL/min (ref >60)
GLUCOSE: 93 mg/dL (ref 70–99)
Potassium: 3.6 mmol/L (ref 3.5–5.1)
SODIUM: 143 mmol/L (ref 135–145)
Total Protein: 6.7 g/dL (ref 6.5–8.1)

## 2018-09-16 LAB — KAPPA/LAMBDA LIGHT CHAINS
KAPPA FREE LGHT CHN: 386.1 mg/L — AB (ref 3.3–19.4)
Kappa, lambda light chain ratio: 33.28 — ABNORMAL HIGH (ref 0.26–1.65)
Lambda free light chains: 11.6 mg/L (ref 5.7–26.3)

## 2018-09-17 LAB — MULTIPLE MYELOMA PANEL, SERUM
ALBUMIN SERPL ELPH-MCNC: 3.4 g/dL (ref 2.9–4.4)
ALBUMIN/GLOB SERPL: 1.4 (ref 0.7–1.7)
Alpha 1: 0.2 g/dL (ref 0.0–0.4)
Alpha2 Glob SerPl Elph-Mcnc: 0.5 g/dL (ref 0.4–1.0)
B-Globulin SerPl Elph-Mcnc: 1 g/dL (ref 0.7–1.3)
GAMMA GLOB SERPL ELPH-MCNC: 0.7 g/dL (ref 0.4–1.8)
GLOBULIN, TOTAL: 2.5 g/dL (ref 2.2–3.9)
IGA: 59 mg/dL — AB (ref 87–352)
IGM (IMMUNOGLOBULIN M), SRM: 34 mg/dL (ref 26–217)
IgG (Immunoglobin G), Serum: 810 mg/dL (ref 700–1600)
M Protein SerPl Elph-Mcnc: 0.2 g/dL — ABNORMAL HIGH
Total Protein ELP: 5.9 g/dL — ABNORMAL LOW (ref 6.0–8.5)

## 2018-09-19 NOTE — Progress Notes (Signed)
Marland Kitchen    HEMATOLOGY/ONCOLOGY CLINIC NOTE  Date of Service: 09/22/18     PCP: Marzetta Board MD  CHIEF COMPLAINTS/PURPOSE OF CONSULTATION:  Continued f/u for smoldering myeloma  HISTORY OF PRESENTING ILLNESS:   Madison Parker is a wonderful 67 y.o. 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  Patient has a history of smoldering multiple myeloma which she reports she was diagnosed with in 2011 when she was evaluated by a hematologist in [place]. She reports that she presented with low blood counts (low WBC)   and after significant workup she had a bone marrow examination based on which she was told that she has smoldering multiple myeloma. Patient notes that she had a bone survey at that time which was negative. She was monitored there closely and then moved to [facility] in 2015 to stay with her son whose wife was being treated for breast cancer. Patient was following with Dr. Delight Hoh in [place]. She reports not having had any treatment for multiple myeloma.  Her last labs from about a year ago showed a SPEP with no OBSERVED M spike . Serum free light chains showed an elevation of kappa Free light chain 310 and lambda free light chain of 12.7 with an abnormal Kappa/ Lambda ratio of 24.38 ( up from 19 previously). Random urine showed an M protein complement of 44%.  She lives in [place] and requested transfer of care to [place]. She was offered follow-up but [facility] in [place]  But prefers to  follow [place] in [place]. Patient reports her energy levels been stable. She has chronic back pain which has improved since the patient had her spinal surgery in April 2017 (L4-L5 interbody fusion). Patient notes she has had some chronic left hip pain which is somewhat more bothersome with the navy on the lateral aspect of her upper thighs that's painful. She also notes some right hip  pain. Over the last few weeks he notes some upper neck pain especially when bending her neck backwards and tingling numbness in her right upper extremity.  Has been working on losing some weight voluntarily.  No acute other new symptoms.  INTERVAL HISTORY  [name] is here for f/u for her smoldering multiple myeloma. The patient's last visit with [place] was on 05/22/18. The pt reports that she is doing well overall.   The pt reports that she has had some continued tingling near her tailbone, which resolves after she completes the exercises she was given for lower back muscular pain.   The pt endorses fluctuating energy levels, but good energy levels overall. She denies any new bone pains.   The pt has recently had a UTI which resolved after antibiotic treatment and denies any other concerns for infections.   Lab results (09/15/18) of CBC w/diff and CMP is as follows: all values are WNL except for WBC at 2.6k, RBC at 3.17, HGB at 10.4, HCT at 31.9, MCV at 100.6, PLT at 119k, ANC at 1.3k, Chloride at 113. 09/22/18 SFLC revealed all values WNL except for Kappa at 386.1, K:L ratio at 33.28 09/22/18 MMP revealed all values WNL except for IgA at 59, Total Protein at 5.9, M Protein at 0.2  On review of systems, pt reports normal and regular bowel movements, recent resolved UTI, lower back muscular pain, and denies new bone pains, other concerns for recent infections, fevers, chills, night sweats, abdominal pains, and  any other symptoms.   MEDICAL HISTORY:  Past Medical History:  Diagnosis Date  . Headache   . Low back pain   . SBO (small bowel obstruction) ([place]) 2010  . Smoldering multiple myeloma ([facility])   Previous history of hypothyroidism- not currently medications Anxiety Obesity .Body mass index is 38.33 kg/m. Vitamin D deficiency Smoldering multiple myeloma diagnosed in 2011 Lumbosacral radiculopathy Left hip pain  SURGICAL HISTORY: Past Surgical History:  Procedure Laterality Date    . ABDOMINAL HYSTERECTOMY     complete  . BACK SURGERY     lower at [facility]  . colonscopy  2014  . GASTRIC BYPASS  yrs ago  . HERNIA REPAIR    . KNEE SURGERY  06/04/2009   both knees replaced   . TONSILLECTOMY  age 59  . TOTAL HIP ARTHROPLASTY Left 08/16/2017   Procedure: LEFT TOTAL HIP ARTHROPLASTY ANTERIOR APPROACH;  Surgeon: Dorna Leitz, MD;  Location: [facility];  Service: Orthopedics;  Laterality: Left;  Spinal surgery April 2017  SOCIAL HISTORY: Social History   Socioeconomic History  . Marital status: Married    Spouse name: Not on file  . Number of children: Not on file  . Years of education: Not on file  . Highest education level: Not on file  Occupational History  . Not on file  Social Needs  . Financial resource strain: Not on file  . Food insecurity:    Worry: Not on file    Inability: Not on file  . Transportation needs:    Medical: Not on file    Non-medical: Not on file  Tobacco Use  . Smoking status: Former Smoker    Packs/day: 0.50    Years: 10.00    Pack years: 5.00    Types: Cigarettes  . Smokeless tobacco: Never Used  . Tobacco comment: quit 1977  Substance and Sexual Activity  . Alcohol use: Yes    Comment: occ  . Drug use: No  . Sexual activity: Not on file  Lifestyle  . Physical activity:    Days per week: Not on file    Minutes per session: Not on file  . Stress: Not on file  Relationships  . Social connections:    Talks on phone: Not on file    Gets together: Not on file    Attends religious service: Not on file    Active member of club or organization: Not on file    Attends meetings of clubs or organizations: Not on file    Relationship status: Not on file  . Intimate partner violence:    Fear of current or ex partner: Not on file    Emotionally abused: Not on file    Physically abused: Not on file    Forced sexual activity: Not on file  Other Topics Concern  . Not on file  Social History Narrative  . Not on file  Occasional  alcohol use Former smoker and smoked 1 pack per week for about 9 years has since quit.  FAMILY HISTORY: No family history on file.  ALLERGIES:  is allergic to codeine.  MEDICATIONS:  Current Outpatient Medications  Medication Sig Dispense Refill  . B Complex-C (B-COMPLEX WITH VITAMIN C) tablet Take 1 tablet by mouth daily.    . Calcium Carbonate-Vitamin D (CALCIUM 600+D) 600-200 MG-UNIT TABS Take 1 tablet by mouth daily.     . Cholecalciferol (VITAMIN D3) 2000 units TABS Take 2,000 Units by mouth daily.     . Potassium 99  MG TABS Take 99 mg by mouth daily.    . vitamin B-12 (CYANOCOBALAMIN) 1000 MCG tablet Take 5,000 mcg by mouth daily.      No current facility-administered medications for this visit.     REVIEW OF SYSTEMS:    A 10+ POINT REVIEW OF SYSTEMS WAS OBTAINED including neurology, dermatology, psychiatry, cardiac, respiratory, lymph, extremities, GI, GU, Musculoskeletal, constitutional, breasts, reproductive, HEENT.  All pertinent positives are noted in the HPI.  All others are negative.   PHYSICAL EXAMINATION: ECOG PERFORMANCE STATUS: 1 - Symptomatic but completely ambulatory  . Vitals:   09/22/18 1001  BP: (!) 150/78  Pulse: 81  Resp: 18  Temp: 97.8 F (36.6 C)  SpO2: 97%   Filed Weights   09/22/18 1001  Weight: 216 lb 6.4 oz (98.2 kg)   .Body mass index is 38.33 kg/m.  GENERAL:alert, in no acute distress and comfortable SKIN: no acute rashes, no significant lesions EYES: conjunctiva are pink and non-injected, sclera anicteric OROPHARYNX: MMM, no exudates, no oropharyngeal erythema or ulceration NECK: supple, no JVD LYMPH:  no palpable lymphadenopathy in the cervical, axillary or inguinal regions LUNGS: clear to auscultation b/l with normal respiratory effort HEART: regular rate & rhythm ABDOMEN:  normoactive bowel sounds , non tender, not distended. No palpable hepatosplenomegaly.  Extremity: no pedal edema PSYCH: alert & oriented x 3 with fluent  speech NEURO: no focal motor/sensory deficits   LABORATORY DATA:  I have reviewed the data as listed  . CBC Latest Ref Rng & Units 09/15/2018 05/22/2018 01/23/2018  WBC 4.0 - 10.5 K/uL 2.6(L) 2.7(L) 3.4(L)  Hemoglobin 12.0 - 15.0 g/dL 10.4(L) 11.1(L) 11.6  Hematocrit 36.0 - 46.0 % 31.9(L) 33.8(L) 35.4  Platelets 150 - 400 K/uL 119(L) 120(L) 142(L)  ANC 1300 . CBC    Component Value Date/Time   WBC 2.6 (L) 09/15/2018 1143   RBC 3.17 (L) 09/15/2018 1143   HGB 10.4 (L) 09/15/2018 1143   HGB 11.1 (L) 05/22/2018 1142   HGB 11.3 (L) 09/30/2017 1145   HCT 31.9 (L) 09/15/2018 1143   HCT 35.1 09/30/2017 1145   PLT 119 (L) 09/15/2018 1143   PLT 120 (L) 05/22/2018 1142   PLT 172 09/30/2017 1145   MCV 100.6 (H) 09/15/2018 1143   MCV 97.5 09/30/2017 1145   MCH 32.8 09/15/2018 1143   MCHC 32.6 09/15/2018 1143   RDW 12.9 09/15/2018 1143   RDW 14.0 09/30/2017 1145   LYMPHSABS 1.1 09/15/2018 1143   LYMPHSABS 1.3 09/30/2017 1145   MONOABS 0.1 09/15/2018 1143   MONOABS 0.2 09/30/2017 1145   EOSABS 0.0 09/15/2018 1143   EOSABS 0.0 09/30/2017 1145   EOSABS 0.1 06/03/2014 1003   BASOSABS 0.0 09/15/2018 1143   BASOSABS 0.0 09/30/2017 1145     . CMP Latest Ref Rng & Units 09/15/2018 05/22/2018 01/23/2018  Glucose 70 - 99 mg/dL 93 95 87  BUN 8 - 23 mg/dL _0 Creatinine 0.44 - 1.00 mg/dL 0.94 0.82 0.96  Sodium 135 - 145 mmol/L 143 140 142  Potassium 3.5 - 5.1 mmol/L 3.6 4.1 3.9  Chloride 98 - 111 mmol/L 113(H) 107 109  CO2 22 - 32 mmol/L _1 Calcium 8.9 - 10.3 mg/dL 8.9 9.8 10.0  Total Protein 6.5 - 8.1 g/dL 6.7 7.2 7.2  Total Bilirubin 0.3 - 1.2 mg/dL 0.8 1.1 0.6  Alkaline Phos 38 - 126 U/L 81 100 114  AST 15 - 41 U/L _2 ALT 0 - 44  U/L _0 Component     Latest Ref Rng & Units 11/08/2016  LDH     125 - 245 U/L 220  Beta 2     0.6 - 2.4 mg/L 2.5 (H)   Component     Latest Ref Rng & Units 07/30/2017  Ferritin     9 - 269 ng/ml 81  Vitamin B12     232 -  1,245 pg/mL 594         RADIOGRAPHIC STUDIES: I have personally reviewed the radiological images as listed and agreed with the findings in the report. No results found.   Bone survey 11/08/2016 IMPRESSION: 1. Small lytic lesion in the right scapula. 2. Possible small lytic lesion in the left scapula. 3. Postoperative changes. 4. Significant degenerative changes in both hips, left greater than right. 5. No evidence for acute fracture   Electronically Signed   By: Nolon Nations M.D.   On: 11/08/2016 16:19   ASSESSMENT & PLAN:   67 y.o. very pleasant lady with history of   1) Smoldering Multiple Myeloma vs Multiple myeloma (concern for small lytic lesions in left and right scapulae) - (appears light chain producing) This was apparently diagnosed in 2011 by her hematologist in [place]. Patient has some mild stable anemia with a hemoglobin of 11.2 today  No renal failure/hypercalcemia. Bone survey with concern fr possible small lytic lesions in B/L scapulae. PET/CT scan on 03/12/2017 showed no concerning bone lesions. Bm Bx with 17% kappa restricted plasma cells consistent with plasma cell neoplasm. No treatment so far.  2) IgG Lambda M spike of 0.2g/dl on labs today - stable. 3)Stable Kappa Lambda SFLC with increased K/L ratio - gradual increase 4) Chronic leukopenia - ? Related to SMM vs other nutritional deficiencies (h/o gastric bypass surgery put her at risk for nutritional deficiencies) B12 levels WNL Ferritin adequate ?additional factor -recent NSAID use. ?element of benign ethnic neutropenia. Has not had any issues with frequent infection.  ANC post-operative normalized today at 1700  PLAN:  -Discussed pt labwork from 09/15/18; some mild anemia with HGB at 10.4, stable mild neutropenia with ANC at 1.3k, mild thrombocytopenia with PLT at 119k -09/15/18 SFLC have been relatively stable with Kappa at 386.1. M Protein stable at 0.2.  -No progressive  anemia, unless Hgb drops beneath 10 without alternative explanations -The pt shows no clinical or lab progression of Smoldering Myeloma to Multiple Myeloma at this time.  -No indication for initiating treatment at this time.  -Continue with Iron Polysaccharide, Vitamin B complex and Vitamin B12 -Will see the pt back in 10 weeks  3) Neuropathy/radiculopathy -continue on her gabapentin 300 mg by mouth at bedtime -Tramadol 50 mg by mouth every 6 hours as needed for her left hip arthritis pain and neuropathy/radiculopathy.   RTC with Dr Irene Limbo with labs in 10 weeks (Labs 1 week prior to clinic visit)   All of the patients questions were answered with apparent satisfaction. The patient knows to call the clinic with any problems, questions or concerns.  The total time spent in the appt was 20 minutes and more than 50% was on counseling and direct patient cares.     Sullivan Lone MD [place] AAHIVMS [facility] Hematology/Oncology Physician [facility]  (Office):       [phone] (Work cell):  [phone] (Fax):           [phone]  I, Baldwin [place], am acting as a scribe for Dr. Suzan Slick  [place].   .I have reviewed the above documentation for accuracy and completeness, and I agree with the above. Brunetta Genera MD

## 2018-09-22 ENCOUNTER — Telehealth: Payer: Self-pay

## 2018-09-22 ENCOUNTER — Inpatient Hospital Stay (HOSPITAL_BASED_OUTPATIENT_CLINIC_OR_DEPARTMENT_OTHER): Payer: Medicare Other | Admitting: Hematology

## 2018-09-22 VITALS — BP 150/78 | HR 81 | Temp 97.8°F | Resp 18 | Ht 63.0 in | Wt 216.4 lb

## 2018-09-22 DIAGNOSIS — R030 Elevated blood-pressure reading, without diagnosis of hypertension: Secondary | ICD-10-CM | POA: Diagnosis not present

## 2018-09-22 DIAGNOSIS — C9000 Multiple myeloma not having achieved remission: Secondary | ICD-10-CM

## 2018-09-22 DIAGNOSIS — G9009 Other idiopathic peripheral autonomic neuropathy: Secondary | ICD-10-CM | POA: Diagnosis not present

## 2018-09-22 DIAGNOSIS — Z96642 Presence of left artificial hip joint: Secondary | ICD-10-CM | POA: Diagnosis not present

## 2018-09-22 DIAGNOSIS — Z6837 Body mass index (BMI) 37.0-37.9, adult: Secondary | ICD-10-CM | POA: Diagnosis not present

## 2018-09-22 DIAGNOSIS — D472 Monoclonal gammopathy: Secondary | ICD-10-CM

## 2018-09-22 DIAGNOSIS — D649 Anemia, unspecified: Secondary | ICD-10-CM

## 2018-09-22 DIAGNOSIS — E538 Deficiency of other specified B group vitamins: Secondary | ICD-10-CM

## 2018-09-22 DIAGNOSIS — E559 Vitamin D deficiency, unspecified: Secondary | ICD-10-CM

## 2018-09-22 NOTE — Telephone Encounter (Signed)
Printed avs and calender of upcoming appointment. Per 1/29 los 

## 2018-11-24 ENCOUNTER — Inpatient Hospital Stay: Payer: Medicare Other | Attending: Hematology

## 2018-11-24 DIAGNOSIS — D72819 Decreased white blood cell count, unspecified: Secondary | ICD-10-CM | POA: Insufficient documentation

## 2018-11-24 DIAGNOSIS — D649 Anemia, unspecified: Secondary | ICD-10-CM

## 2018-11-24 DIAGNOSIS — C9000 Multiple myeloma not having achieved remission: Secondary | ICD-10-CM | POA: Diagnosis not present

## 2018-11-24 DIAGNOSIS — D472 Monoclonal gammopathy: Secondary | ICD-10-CM

## 2018-11-24 DIAGNOSIS — E538 Deficiency of other specified B group vitamins: Secondary | ICD-10-CM

## 2018-11-24 DIAGNOSIS — E559 Vitamin D deficiency, unspecified: Secondary | ICD-10-CM

## 2018-11-24 LAB — CMP (CANCER CENTER ONLY)
ALT: 30 U/L (ref 0–44)
AST: 26 U/L (ref 15–41)
Albumin: 4.2 g/dL (ref 3.5–5.0)
Alkaline Phosphatase: 104 U/L (ref 38–126)
Anion gap: 10 (ref 5–15)
BUN: 14 mg/dL (ref 8–23)
CO2: 24 mmol/L (ref 22–32)
Calcium: 9.5 mg/dL (ref 8.9–10.3)
Chloride: 107 mmol/L (ref 98–111)
Creatinine: 0.95 mg/dL (ref 0.44–1.00)
GFR, Est AFR Am: >60 mL/min (ref >60)
GFR, Estimated: >60 mL/min (ref >60)
Glucose, Bld: 88 mg/dL (ref 70–99)
Potassium: 4 mmol/L (ref 3.5–5.1)
Sodium: 141 mmol/L (ref 135–145)
Total Bilirubin: 0.7 mg/dL (ref 0.3–1.2)
Total Protein: 6.8 g/dL (ref 6.5–8.1)

## 2018-11-24 LAB — VITAMIN B12: Vitamin B-12: 1108 pg/mL — ABNORMAL HIGH (ref 180–914)

## 2018-11-25 ENCOUNTER — Telehealth: Payer: Self-pay | Admitting: *Deleted

## 2018-11-25 DIAGNOSIS — D472 Monoclonal gammopathy: Secondary | ICD-10-CM

## 2018-11-25 DIAGNOSIS — C9000 Multiple myeloma not having achieved remission: Secondary | ICD-10-CM

## 2018-11-25 LAB — KAPPA/LAMBDA LIGHT CHAINS
Kappa free light chain: 570.8 mg/L — ABNORMAL HIGH (ref 3.3–19.4)
Kappa, lambda light chain ratio: 55.42 — ABNORMAL HIGH (ref 0.26–1.65)
Lambda free light chains: 10.3 mg/L (ref 5.7–26.3)

## 2018-11-25 LAB — VITAMIN D 25 HYDROXY (VIT D DEFICIENCY, FRACTURES): Vit D, 25-Hydroxy: 20.2 ng/mL — ABNORMAL LOW (ref 30.0–100.0)

## 2018-11-25 NOTE — Telephone Encounter (Signed)
CBC ordered for 2/17

## 2018-11-25 NOTE — Telephone Encounter (Signed)
Lab notified office that CBC drawn 11/24/2018 was not an acceptable sample. Per Dr. Irene Limbo, can have CBC repeated prior to appt on 2/17.  Contacted patient to advise that CBC could not be completed and needs to be repeated on Monday 2/17 prior to seeing Dr. Irene Limbo. Advised her that lab appt will be at 1:15, prior to 1:40 MD appt. She verbalized understanding and stated she will be here thern

## 2018-11-26 LAB — MULTIPLE MYELOMA PANEL, SERUM
ALPHA 1: 0.2 g/dL (ref 0.0–0.4)
Albumin SerPl Elph-Mcnc: 4 g/dL (ref 2.9–4.4)
Albumin/Glob SerPl: 1.5 (ref 0.7–1.7)
Alpha2 Glob SerPl Elph-Mcnc: 0.7 g/dL (ref 0.4–1.0)
B-Globulin SerPl Elph-Mcnc: 1 g/dL (ref 0.7–1.3)
Gamma Glob SerPl Elph-Mcnc: 0.8 g/dL (ref 0.4–1.8)
Globulin, Total: 2.8 g/dL (ref 2.2–3.9)
IGG (IMMUNOGLOBIN G), SERUM: 878 mg/dL (ref 700–1600)
IgA: 52 mg/dL — ABNORMAL LOW (ref 87–352)
IgM (Immunoglobulin M), Srm: 34 mg/dL (ref 26–217)
Total Protein ELP: 6.8 g/dL (ref 6.0–8.5)

## 2018-11-28 NOTE — Progress Notes (Signed)
Marland Kitchen    HEMATOLOGY/ONCOLOGY CLINIC NOTE  Date of Service: 12/01/18     PCP: Marzetta Board MD  CHIEF COMPLAINTS/PURPOSE OF CONSULTATION:  Continued f/u for smoldering myeloma  HISTORY OF PRESENTING ILLNESS:   Madison Parker is a wonderful 68 y.o. 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  Patient has a history of smoldering multiple myeloma which she reports she was diagnosed with in 2011 when she was evaluated by a hematologist in [place]. She reports that she presented with low blood counts (low WBC)   and after significant workup she had a bone marrow examination based on which she was told that she has smoldering multiple myeloma. Patient notes that she had a bone survey at that time which was negative. She was monitored there closely and then moved to [facility] in 2015 to stay with her son whose wife was being treated for breast cancer. Patient was following with Dr. Delight Hoh in [place]. She reports not having had any treatment for multiple myeloma.  Her last labs from about a year ago showed a SPEP with no OBSERVED M spike . Serum free light chains showed an elevation of kappa Free light chain 310 and lambda free light chain of 12.7 with an abnormal Kappa/ Lambda ratio of 24.38 ( up from 19 previously). Random urine showed an M protein complement of 44%.  She lives in [place] and requested transfer of care to [place]. She was offered follow-up but [facility] in [place]  But prefers to  follow [place] in [place]. Patient reports her energy levels been stable. She has chronic back pain which has improved since the patient had her spinal surgery in April 2017 (L4-L5 interbody fusion). Patient notes she has had some chronic left hip pain which is somewhat more bothersome with the navy on the lateral aspect of her upper thighs that's painful. She also notes some right hip  pain. Over the last few weeks he notes some upper neck pain especially when bending her neck backwards and tingling numbness in her right upper extremity.  Has been working on losing some weight voluntarily.  No acute other new symptoms.  INTERVAL HISTORY  Madison Parker is here for f/u for her smoldering multiple myeloma. The patient's last visit with [place] was on 09/22/18. The pt reports that she is doing well overall.   The pt reports that she recently went on a cruise. She notes that she continues to have lower back pain, worsened by standing for long periods of time, and describes the pain as muscular and improves after massage.   The pt notes that she sometimes takes her Vitamin D replacement, which she notes is supposed to be daily.   She also notes that her energy levels have been good, and has gained 3 pounds in the interim. She also notes that her left leg neuropathy/radiculopathy continues and she is following with orthopedics and is not taking any medication for this.   Lab results (11/24/18) of CBC w/diff is as follows: all values are WNL except for WBC at 3.1k, RBC at 3.32, HGB at 10.5, HCT at 32.3, PLT at 123k, nRBC at 1.0%, ANC at 1.1k. 11/24/18 MMP revealed all values WNL except for IgA at 52 11/24/18 SFLC revealed Kappa at 570.8 and K:L ratio of 55.42 11/24/18 Vitamin D low at 20.2  On review of systems, pt reports lower back pain, good energy levels,  stable weight, and denies abdominal pains, new bone pains, leg swelling, and any other symptoms.    MEDICAL HISTORY:  Past Medical History:  Diagnosis Date  . Headache   . Low back pain   . SBO (small bowel obstruction) ([place]) 2010  . Smoldering multiple myeloma ([facility])   Previous history of hypothyroidism- not currently medications Anxiety Obesity .Body mass index is 38.9 kg/m. Vitamin D deficiency Smoldering multiple myeloma diagnosed in 2011 Lumbosacral radiculopathy Left hip pain  SURGICAL HISTORY: Past Surgical  History:  Procedure Laterality Date  . ABDOMINAL HYSTERECTOMY     complete  . BACK SURGERY     lower at [facility]  . colonscopy  2014  . GASTRIC BYPASS  yrs ago  . HERNIA REPAIR    . KNEE SURGERY  06/04/2009   both knees replaced   . TONSILLECTOMY  age 87  . TOTAL HIP ARTHROPLASTY Left 08/16/2017   Procedure: LEFT TOTAL HIP ARTHROPLASTY ANTERIOR APPROACH;  Surgeon: Dorna Leitz, MD;  Location: [facility];  Service: Orthopedics;  Laterality: Left;  Spinal surgery April 2017  SOCIAL HISTORY: Social History   Socioeconomic History  . Marital status: Married    Spouse name: Not on file  . Number of children: Not on file  . Years of education: Not on file  . Highest education level: Not on file  Occupational History  . Not on file  Social Needs  . Financial resource strain: Not on file  . Food insecurity:    Worry: Not on file    Inability: Not on file  . Transportation needs:    Medical: Not on file    Non-medical: Not on file  Tobacco Use  . Smoking status: Former Smoker    Packs/day: 0.50    Years: 10.00    Pack years: 5.00    Types: Cigarettes  . Smokeless tobacco: Never Used  . Tobacco comment: quit 1977  Substance and Sexual Activity  . Alcohol use: Yes    Comment: occ  . Drug use: No  . Sexual activity: Not on file  Lifestyle  . Physical activity:    Days per week: Not on file    Minutes per session: Not on file  . Stress: Not on file  Relationships  . Social connections:    Talks on phone: Not on file    Gets together: Not on file    Attends religious service: Not on file    Active member of club or organization: Not on file    Attends meetings of clubs or organizations: Not on file    Relationship status: Not on file  . Intimate partner violence:    Fear of current or ex partner: Not on file    Emotionally abused: Not on file    Physically abused: Not on file    Forced sexual activity: Not on file  Other Topics Concern  . Not on file  Social History  Narrative  . Not on file  Occasional alcohol use Former smoker and smoked 1 pack per week for about 9 years has since quit.  FAMILY HISTORY: No family history on file.  ALLERGIES:  is allergic to codeine.  MEDICATIONS:  Current Outpatient Medications  Medication Sig Dispense Refill  . B Complex-C (B-COMPLEX WITH VITAMIN C) tablet Take 1 tablet by mouth daily.    . Potassium 99 MG TABS Take 99 mg by mouth daily.    . vitamin B-12 (CYANOCOBALAMIN) 1000 MCG tablet Take 5,000 mcg by mouth daily.  No current facility-administered medications for this visit.     REVIEW OF SYSTEMS:    A 10+ POINT REVIEW OF SYSTEMS WAS OBTAINED including neurology, dermatology, psychiatry, cardiac, respiratory, lymph, extremities, GI, GU, Musculoskeletal, constitutional, breasts, reproductive, HEENT.  All pertinent positives are noted in the HPI.  All others are negative.   PHYSICAL EXAMINATION: ECOG PERFORMANCE STATUS: 1 - Symptomatic but completely ambulatory  . Vitals:   12/01/18 1347  BP: 139/75  Pulse: 81  Resp: 18  Temp: (!) 97.5 F (36.4 C)  SpO2: 96%   Filed Weights   12/01/18 1347  Weight: 219 lb 9.6 oz (99.6 kg)   .Body mass index is 38.9 kg/m.  GENERAL:alert, in no acute distress and comfortable SKIN: no acute rashes, no significant lesions EYES: conjunctiva are pink and non-injected, sclera anicteric OROPHARYNX: MMM, no exudates, no oropharyngeal erythema or ulceration NECK: supple, no JVD LYMPH:  no palpable lymphadenopathy in the cervical, axillary or inguinal regions LUNGS: clear to auscultation b/l with normal respiratory effort HEART: regular rate & rhythm ABDOMEN:  normoactive bowel sounds , non tender, not distended. No palpable hepatosplenomegaly.  Extremity: no pedal edema PSYCH: alert & oriented x 3 with fluent speech NEURO: no focal motor/sensory deficits   LABORATORY DATA:  I have reviewed the data as listed  . CBC Latest Ref Rng & Units 12/01/2018  09/15/2018 05/22/2018  WBC 4.0 - 10.5 K/uL 3.1(L) 2.6(L) 2.7(L)  Hemoglobin 12.0 - 15.0 g/dL 10.5(L) 10.4(L) 11.1(L)  Hematocrit 36.0 - 46.0 % 32.3(L) 31.9(L) 33.8(L)  Platelets 150 - 400 K/uL 123(L) 119(L) 120(L)  ANC 1300 . CBC    Component Value Date/Time   WBC 3.1 (L) 12/01/2018 1256   WBC 2.6 (L) 09/15/2018 1143   RBC 3.32 (L) 12/01/2018 1256   HGB 10.5 (L) 12/01/2018 1256   HGB 11.3 (L) 09/30/2017 1145   HCT 32.3 (L) 12/01/2018 1256   HCT 35.1 09/30/2017 1145   PLT 123 (L) 12/01/2018 1256   PLT 172 09/30/2017 1145   MCV 97.3 12/01/2018 1256   MCV 97.5 09/30/2017 1145   MCH 31.6 12/01/2018 1256   MCHC 32.5 12/01/2018 1256   RDW 12.5 12/01/2018 1256   RDW 14.0 09/30/2017 1145   LYMPHSABS 1.8 12/01/2018 1256   LYMPHSABS 1.3 09/30/2017 1145   MONOABS 0.2 12/01/2018 1256   MONOABS 0.2 09/30/2017 1145   EOSABS 0.0 12/01/2018 1256   EOSABS 0.0 09/30/2017 1145   EOSABS 0.1 06/03/2014 1003   BASOSABS 0.0 12/01/2018 1256   BASOSABS 0.0 09/30/2017 1145     . CMP Latest Ref Rng & Units 11/24/2018 09/15/2018 05/22/2018  Glucose 70 - 99 mg/dL 88 93 95  BUN 8 - 23 mg/dL _0 Creatinine 0.44 - 1.00 mg/dL 0.95 0.94 0.82  Sodium 135 - 145 mmol/L 141 143 140  Potassium 3.5 - 5.1 mmol/L 4.0 3.6 4.1  Chloride 98 - 111 mmol/L 107 113(H) 107  CO2 22 - 32 mmol/L _1 Calcium 8.9 - 10.3 mg/dL 9.5 8.9 9.8  Total Protein 6.5 - 8.1 g/dL 6.8 6.7 7.2  Total Bilirubin 0.3 - 1.2 mg/dL 0.7 0.8 1.1  Alkaline Phos 38 - 126 U/L 104 81 100  AST 15 - 41 U/L _2 ALT 0 - 44 U/L _3 Component     Latest Ref Rng & Units 11/08/2016  LDH     125 - 245 U/L 220  Beta 2     0.6 -  2.4 mg/L 2.5 (H)   Component     Latest Ref Rng & Units 07/30/2017  Ferritin     9 - 269 ng/ml 81  Vitamin B12     232 - 1,245 pg/mL 594         RADIOGRAPHIC STUDIES: I have personally reviewed the radiological images as listed and agreed with the findings in the report. No results found.    Bone survey 11/08/2016 IMPRESSION: 1. Small lytic lesion in the right scapula. 2. Possible small lytic lesion in the left scapula. 3. Postoperative changes. 4. Significant degenerative changes in both hips, left greater than right. 5. No evidence for acute fracture   Electronically Signed   By: Nolon Nations M.D.   On: 11/08/2016 16:19   ASSESSMENT & PLAN:   68 y.o. very pleasant lady with history of   1) Smoldering Multiple Myeloma vs Multiple myeloma (concern for small lytic lesions in left and right scapulae) - (appears light chain producing) This was apparently diagnosed in 2011 by her hematologist in [place]. Patient has some mild stable anemia with a hemoglobin of 11.2 today  No renal failure/hypercalcemia. Bone survey with concern fr possible small lytic lesions in B/L scapulae. PET/CT scan on 03/12/2017 showed no concerning bone lesions. Bm Bx with 17% kappa restricted plasma cells consistent with plasma cell neoplasm. No treatment so far.  2) IgG Lambda M spike of 0.2g/dl on labs today - stable. 3)Stable Kappa Lambda SFLC with increased K/L ratio - gradual increase 4) Chronic leukopenia - ? Related to SMM vs other nutritional deficiencies (h/o gastric bypass surgery put her at risk for nutritional deficiencies) B12 levels WNL Ferritin adequate ?additional factor -recent NSAID use. ?element of benign ethnic neutropenia. Has not had any issues with frequent infection.  ANC post-operative normalized today at 1700  PLAN:  -Discussed pt labwork from 12/01/18; HGB stable at 10.5, blood counts stable. 11/24/18 CMP was normal. Vitamin D low at 20.2. Kappa light chains increased to 570 with a K:L ratio of 55.42 -Begin 50k Ergocalciferol weekly -Will continue to watch Kappa light chains closely, chemistries remain normal including normal renal functions and normal calcium levels. No new bone pains. -No progressive anemia, unless Hgb drops beneath 10 without  alternative explanations -No indication for initiating treatment at this time.  -Continue with Iron Polysaccharide, Vitamin B complex and Vitamin B12 -Will see the pt back in 3 months  3) Neuropathy/radiculopathy -Continue follow up with orthopedics   RTC with Dr Irene Limbo in 3 months with labs (plz schedule labs 7 days prior to clinic visit)   All of the patients questions were answered with apparent satisfaction. The patient knows to call the clinic with any problems, questions or concerns.  The total time spent in the appt was 25 minutes and more than 50% was on counseling and direct patient cares.     Sullivan Lone MD [place] AAHIVMS [facility] Hematology/Oncology Physician [facility]  (Office):       [phone] (Work cell):  [phone] (Fax):           [phone]  I, Baldwin Jamaica, am acting as a scribe for Dr. Sullivan Lone.   .I have reviewed the above documentation for accuracy and completeness, and I agree with the above. Brunetta Genera MD

## 2018-12-01 ENCOUNTER — Inpatient Hospital Stay: Payer: Medicare Other

## 2018-12-01 ENCOUNTER — Inpatient Hospital Stay (HOSPITAL_BASED_OUTPATIENT_CLINIC_OR_DEPARTMENT_OTHER): Payer: Medicare Other | Admitting: Hematology

## 2018-12-01 ENCOUNTER — Telehealth: Payer: Self-pay | Admitting: Hematology

## 2018-12-01 VITALS — BP 139/75 | HR 81 | Temp 97.5°F | Resp 18 | Ht 63.0 in | Wt 219.6 lb

## 2018-12-01 DIAGNOSIS — C9000 Multiple myeloma not having achieved remission: Secondary | ICD-10-CM

## 2018-12-01 DIAGNOSIS — E559 Vitamin D deficiency, unspecified: Secondary | ICD-10-CM

## 2018-12-01 DIAGNOSIS — D72819 Decreased white blood cell count, unspecified: Secondary | ICD-10-CM

## 2018-12-01 DIAGNOSIS — D472 Monoclonal gammopathy: Secondary | ICD-10-CM

## 2018-12-01 DIAGNOSIS — E538 Deficiency of other specified B group vitamins: Secondary | ICD-10-CM

## 2018-12-01 LAB — CBC WITH DIFFERENTIAL (CANCER CENTER ONLY)
ABS IMMATURE GRANULOCYTES: 0.01 10*3/uL (ref 0.00–0.07)
BASOS ABS: 0 10*3/uL (ref 0.0–0.1)
Basophils Relative: 0 %
Eosinophils Absolute: 0 10*3/uL (ref 0.0–0.5)
Eosinophils Relative: 0 %
HCT: 32.3 % — ABNORMAL LOW (ref 36.0–46.0)
Hemoglobin: 10.5 g/dL — ABNORMAL LOW (ref 12.0–15.0)
IMMATURE GRANULOCYTES: 0 %
Lymphocytes Relative: 58 %
Lymphs Abs: 1.8 10*3/uL (ref 0.7–4.0)
MCH: 31.6 pg (ref 26.0–34.0)
MCHC: 32.5 g/dL (ref 30.0–36.0)
MCV: 97.3 fL (ref 80.0–100.0)
Monocytes Absolute: 0.2 10*3/uL (ref 0.1–1.0)
Monocytes Relative: 7 %
NEUTROS PCT: 35 %
NRBC: 1 % — AB (ref 0.0–0.2)
Neutro Abs: 1.1 10*3/uL — ABNORMAL LOW (ref 1.7–7.7)
Platelet Count: 123 10*3/uL — ABNORMAL LOW (ref 150–400)
RBC: 3.32 MIL/uL — ABNORMAL LOW (ref 3.87–5.11)
RDW: 12.5 % (ref 11.5–15.5)
WBC Count: 3.1 10*3/uL — ABNORMAL LOW (ref 4.0–10.5)

## 2018-12-01 MED ORDER — ERGOCALCIFEROL 1.25 MG (50000 UT) PO CAPS: 50000.0000 [IU] | ORAL_CAPSULE | ORAL | 1 refills | Status: DC

## 2018-12-01 NOTE — Telephone Encounter (Signed)
Scheduled appt per 02/17 los. Printed calendar and avs.  °

## 2019-02-02 ENCOUNTER — Other Ambulatory Visit: Payer: Self-pay | Admitting: Internal Medicine

## 2019-02-02 DIAGNOSIS — E785 Hyperlipidemia, unspecified: Secondary | ICD-10-CM | POA: Diagnosis not present

## 2019-02-02 DIAGNOSIS — E039 Hypothyroidism, unspecified: Secondary | ICD-10-CM | POA: Diagnosis not present

## 2019-02-02 DIAGNOSIS — N281 Cyst of kidney, acquired: Secondary | ICD-10-CM | POA: Diagnosis not present

## 2019-02-02 DIAGNOSIS — E2839 Other primary ovarian failure: Secondary | ICD-10-CM

## 2019-02-02 DIAGNOSIS — C9000 Multiple myeloma not having achieved remission: Secondary | ICD-10-CM | POA: Diagnosis not present

## 2019-02-02 DIAGNOSIS — G9009 Other idiopathic peripheral autonomic neuropathy: Secondary | ICD-10-CM | POA: Diagnosis not present

## 2019-02-04 ENCOUNTER — Encounter: Payer: Self-pay | Admitting: Gastroenterology

## 2019-02-23 ENCOUNTER — Inpatient Hospital Stay: Payer: Medicare Other | Attending: Hematology

## 2019-02-23 ENCOUNTER — Other Ambulatory Visit: Payer: Self-pay

## 2019-02-23 DIAGNOSIS — Z79899 Other long term (current) drug therapy: Secondary | ICD-10-CM | POA: Insufficient documentation

## 2019-02-23 DIAGNOSIS — E039 Hypothyroidism, unspecified: Secondary | ICD-10-CM | POA: Diagnosis not present

## 2019-02-23 DIAGNOSIS — Z6837 Body mass index (BMI) 37.0-37.9, adult: Secondary | ICD-10-CM | POA: Diagnosis not present

## 2019-02-23 DIAGNOSIS — G629 Polyneuropathy, unspecified: Secondary | ICD-10-CM | POA: Insufficient documentation

## 2019-02-23 DIAGNOSIS — D72819 Decreased white blood cell count, unspecified: Secondary | ICD-10-CM | POA: Diagnosis not present

## 2019-02-23 DIAGNOSIS — Z8579 Personal history of other malignant neoplasms of lymphoid, hematopoietic and related tissues: Secondary | ICD-10-CM | POA: Diagnosis not present

## 2019-02-23 DIAGNOSIS — E7800 Pure hypercholesterolemia, unspecified: Secondary | ICD-10-CM | POA: Diagnosis not present

## 2019-02-23 DIAGNOSIS — D472 Monoclonal gammopathy: Secondary | ICD-10-CM

## 2019-02-23 DIAGNOSIS — Z9884 Bariatric surgery status: Secondary | ICD-10-CM | POA: Insufficient documentation

## 2019-02-23 DIAGNOSIS — M169 Osteoarthritis of hip, unspecified: Secondary | ICD-10-CM | POA: Diagnosis not present

## 2019-02-23 DIAGNOSIS — C9000 Multiple myeloma not having achieved remission: Secondary | ICD-10-CM

## 2019-02-23 DIAGNOSIS — Z87891 Personal history of nicotine dependence: Secondary | ICD-10-CM | POA: Insufficient documentation

## 2019-02-23 DIAGNOSIS — M799 Soft tissue disorder, unspecified: Secondary | ICD-10-CM | POA: Diagnosis not present

## 2019-02-23 DIAGNOSIS — G9009 Other idiopathic peripheral autonomic neuropathy: Secondary | ICD-10-CM | POA: Diagnosis not present

## 2019-02-23 LAB — CMP (CANCER CENTER ONLY)
ALT: 38 U/L (ref 0–44)
AST: 29 U/L (ref 15–41)
Albumin: 4.1 g/dL (ref 3.5–5.0)
Alkaline Phosphatase: 92 U/L (ref 38–126)
Anion gap: 9 (ref 5–15)
BUN: 13 mg/dL (ref 8–23)
CO2: 24 mmol/L (ref 22–32)
Calcium: 9.4 mg/dL (ref 8.9–10.3)
Chloride: 107 mmol/L (ref 98–111)
Creatinine: 0.84 mg/dL (ref 0.44–1.00)
GFR, Est AFR Am: >60 mL/min (ref >60)
GFR, Estimated: >60 mL/min (ref >60)
Glucose, Bld: 81 mg/dL (ref 70–99)
Potassium: 4.3 mmol/L (ref 3.5–5.1)
Sodium: 140 mmol/L (ref 135–145)
Total Bilirubin: 0.8 mg/dL (ref 0.3–1.2)
Total Protein: 7 g/dL (ref 6.5–8.1)

## 2019-02-23 LAB — CBC WITH DIFFERENTIAL/PLATELET
Abs Immature Granulocytes: 0 10*3/uL (ref 0.00–0.07)
Basophils Absolute: 0 10*3/uL (ref 0.0–0.1)
Basophils Relative: 0 %
Eosinophils Absolute: 0 10*3/uL (ref 0.0–0.5)
Eosinophils Relative: 1 %
HCT: 31.9 % — ABNORMAL LOW (ref 36.0–46.0)
Hemoglobin: 10.1 g/dL — ABNORMAL LOW (ref 12.0–15.0)
Immature Granulocytes: 0 %
Lymphocytes Relative: 60 %
Lymphs Abs: 1.5 10*3/uL (ref 0.7–4.0)
MCH: 31.1 pg (ref 26.0–34.0)
MCHC: 31.7 g/dL (ref 30.0–36.0)
MCV: 98.2 fL (ref 80.0–100.0)
Monocytes Absolute: 0.2 10*3/uL (ref 0.1–1.0)
Monocytes Relative: 7 %
Neutro Abs: 0.8 10*3/uL — ABNORMAL LOW (ref 1.7–7.7)
Neutrophils Relative %: 32 %
Platelets: 115 10*3/uL — ABNORMAL LOW (ref 150–400)
RBC: 3.25 MIL/uL — ABNORMAL LOW (ref 3.87–5.11)
RDW: 13.9 % (ref 11.5–15.5)
WBC: 2.5 10*3/uL — ABNORMAL LOW (ref 4.0–10.5)
nRBC: 0.8 % — ABNORMAL HIGH (ref 0.0–0.2)

## 2019-02-24 LAB — MULTIPLE MYELOMA PANEL, SERUM
Albumin SerPl Elph-Mcnc: 4 g/dL (ref 2.9–4.4)
Albumin/Glob SerPl: 1.5 (ref 0.7–1.7)
Alpha 1: 0.2 g/dL (ref 0.0–0.4)
Alpha2 Glob SerPl Elph-Mcnc: 0.6 g/dL (ref 0.4–1.0)
B-Globulin SerPl Elph-Mcnc: 1 g/dL (ref 0.7–1.3)
Gamma Glob SerPl Elph-Mcnc: 0.8 g/dL (ref 0.4–1.8)
Globulin, Total: 2.7 g/dL (ref 2.2–3.9)
IgA: 56 mg/dL — ABNORMAL LOW (ref 87–352)
IgG (Immunoglobin G), Serum: 851 mg/dL (ref 586–1602)
IgM (Immunoglobulin M), Srm: 36 mg/dL (ref 26–217)
M Protein SerPl Elph-Mcnc: 0.3 g/dL — ABNORMAL HIGH
Total Protein ELP: 6.7 g/dL (ref 6.0–8.5)

## 2019-02-24 LAB — KAPPA/LAMBDA LIGHT CHAINS
Kappa free light chain: 577.2 mg/L — ABNORMAL HIGH (ref 3.3–19.4)
Kappa, lambda light chain ratio: 46.18 — ABNORMAL HIGH (ref 0.26–1.65)
Lambda free light chains: 12.5 mg/L (ref 5.7–26.3)

## 2019-02-26 NOTE — Progress Notes (Signed)
Marland Kitchen    HEMATOLOGY/ONCOLOGY CLINIC NOTE  Date of Service: 03/02/19     PCP: Marzetta Board MD  CHIEF COMPLAINTS/PURPOSE OF CONSULTATION:  Continued f/u for smoldering myeloma  HISTORY OF PRESENTING ILLNESS:   Madison Parker is a wonderful 68 y.o. 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  Patient has a history of smoldering multiple myeloma which she reports she was diagnosed with in 2011 when she was evaluated by a hematologist in [place]. She reports that she presented with low blood counts (low WBC)   and after significant workup she had a bone marrow examination based on which she was told that she has smoldering multiple myeloma. Patient notes that she had a bone survey at that time which was negative. She was monitored there closely and then moved to [facility] in 2015 to stay with her son whose wife was being treated for breast cancer. Patient was following with Dr. Delight Hoh in [place]. She reports not having had any treatment for multiple myeloma.  Her last labs from about a year ago showed a SPEP with no OBSERVED M spike . Serum free light chains showed an elevation of kappa Free light chain 310 and lambda free light chain of 12.7 with an abnormal Kappa/ Lambda ratio of 24.38 ( up from 19 previously). Random urine showed an M protein complement of 44%.  She lives in [place] and requested transfer of care to [place]. She was offered follow-up but [facility] in [place]  But prefers to  follow [place] in [place]. Patient reports her energy levels been stable. She has chronic back pain which has improved since the patient had her spinal surgery in April 2017 (L4-L5 interbody fusion). Patient notes she has had some chronic left hip pain which is somewhat more bothersome with the navy on the lateral aspect of her upper thighs that's painful. She also notes some right hip  pain. Over the last few weeks he notes some upper neck pain especially when bending her neck backwards and tingling numbness in her right upper extremity.  Has been working on losing some weight voluntarily.  No acute other new symptoms.  INTERVAL HISTORY  Madison Parker is here for f/u for her smoldering multiple myeloma.  The patient's last visit with [place] was on 12/01/18. The pt reports that she is doing well overall.  The pt reports that she did follow up with her PCP regarding her renal cyst and is anticipating repeat imaging but notes that this hasn't been scheduled yet. She endorses lower, right sided back pain which she notes she was also able to discuss. She had lower lumbar fusion surgery 3 years ago, and notes that her back pain is similar and has gradually returned. She notes that her back pain has become constant and she takes Tylenol, which is no longer relieving her pain. She notes that sometimes her leg will go numb with positional elements endorsed.  The pt denies concerns for bleeding or blood loss in the interim.  Lab results today (02/23/19) of CBC w/diff and CMP is as follows: all values are WNL except for WBC at 2.5k, RBC at 3.25, HGB at 10.1, HCT at 31.9, PLT at 115k, nRBC at 0.8%, ANC at 800. 02/23/19 MMP revealed all values WNL except for IgA at 56, and M Protein at 0.3g 02/23/19 SFLC revealed Kappa at 577.2 and K:L ratio of 46.18  On review  of systems, pt reports worsened lower right back pain occasional leg numbness, moving her bowels well, stable energy levels, and denies blood loss, abdominal pains, leg swelling, and any other symptoms.   MEDICAL HISTORY:  Past Medical History:  Diagnosis Date  . Headache   . Low back pain   . SBO (small bowel obstruction) ([place]) 2010  . Smoldering multiple myeloma ([facility])   Previous history of hypothyroidism- not currently medications Anxiety Obesity .Body mass index is 38.23 kg/m. Vitamin D deficiency Smoldering multiple myeloma  diagnosed in 2011 Lumbosacral radiculopathy Left hip pain  SURGICAL HISTORY: Past Surgical History:  Procedure Laterality Date  . ABDOMINAL HYSTERECTOMY     complete  . BACK SURGERY     lower at [facility]  . colonscopy  2014  . GASTRIC BYPASS  yrs ago  . HERNIA REPAIR    . KNEE SURGERY  06/04/2009   both knees replaced   . TONSILLECTOMY  age 80  . TOTAL HIP ARTHROPLASTY Left 08/16/2017   Procedure: LEFT TOTAL HIP ARTHROPLASTY ANTERIOR APPROACH;  Surgeon: Dorna Leitz, MD;  Location: [facility];  Service: Orthopedics;  Laterality: Left;  Spinal surgery April 2017  SOCIAL HISTORY: Social History   Socioeconomic History  . Marital status: Married    Spouse name: Not on file  . Number of children: Not on file  . Years of education: Not on file  . Highest education level: Not on file  Occupational History  . Not on file  Social Needs  . Financial resource strain: Not on file  . Food insecurity:    Worry: Not on file    Inability: Not on file  . Transportation needs:    Medical: Not on file    Non-medical: Not on file  Tobacco Use  . Smoking status: Former Smoker    Packs/day: 0.50    Years: 10.00    Pack years: 5.00    Types: Cigarettes  . Smokeless tobacco: Never Used  . Tobacco comment: quit 1977  Substance and Sexual Activity  . Alcohol use: Yes    Comment: occ  . Drug use: No  . Sexual activity: Not on file  Lifestyle  . Physical activity:    Days per week: Not on file    Minutes per session: Not on file  . Stress: Not on file  Relationships  . Social connections:    Talks on phone: Not on file    Gets together: Not on file    Attends religious service: Not on file    Active member of club or organization: Not on file    Attends meetings of clubs or organizations: Not on file    Relationship status: Not on file  . Intimate partner violence:    Fear of current or ex partner: Not on file    Emotionally abused: Not on file    Physically abused: Not on file     Forced sexual activity: Not on file  Other Topics Concern  . Not on file  Social History Narrative  . Not on file  Occasional alcohol use Former smoker and smoked 1 pack per week for about 9 years has since quit.  FAMILY HISTORY: No family history on file.  ALLERGIES:  is allergic to codeine.  MEDICATIONS:  Current Outpatient Medications  Medication Sig Dispense Refill  . B Complex-C (B-COMPLEX WITH VITAMIN C) tablet Take 1 tablet by mouth daily.    . ergocalciferol (VITAMIN D2) 1.25 MG (50000 UT) capsule Take 1 capsule (  50,000 Units total) by mouth once a week. 12 capsule 1  . Potassium 99 MG TABS Take 99 mg by mouth daily.    . vitamin B-12 (CYANOCOBALAMIN) 1000 MCG tablet Take 5,000 mcg by mouth daily.      No current facility-administered medications for this visit.     REVIEW OF SYSTEMS:    A 10+ POINT REVIEW OF SYSTEMS WAS OBTAINED including neurology, dermatology, psychiatry, cardiac, respiratory, lymph, extremities, GI, GU, Musculoskeletal, constitutional, breasts, reproductive, HEENT.  All pertinent positives are noted in the HPI.  All others are negative.   PHYSICAL EXAMINATION: ECOG PERFORMANCE STATUS: 1 - Symptomatic but completely ambulatory  . Vitals:   03/02/19 1206  BP: 134/78  Pulse: 78  Resp: 18  Temp: 98.9 F (37.2 C)  SpO2: 100%   Filed Weights   03/02/19 1206  Weight: 215 lb 12.8 oz (97.9 kg)   .Body mass index is 38.23 kg/m.  GENERAL:alert, in no acute distress and comfortable SKIN: no acute rashes, no significant lesions EYES: conjunctiva are pink and non-injected, sclera anicteric OROPHARYNX: MMM, no exudates, no oropharyngeal erythema or ulceration NECK: supple, no JVD LYMPH:  no palpable lymphadenopathy in the cervical, axillary or inguinal regions LUNGS: clear to auscultation b/l with normal respiratory effort HEART: regular rate & rhythm ABDOMEN:  normoactive bowel sounds , non tender, not distended. No palpable hepatosplenomegaly.   Extremity: no pedal edema PSYCH: alert & oriented x 3 with fluent speech NEURO: no focal motor/sensory deficits   LABORATORY DATA:  I have reviewed the data as listed  . CBC Latest Ref Rng & Units 02/23/2019 12/01/2018 09/15/2018  WBC 4.0 - 10.5 K/uL 2.5(L) 3.1(L) 2.6(L)  Hemoglobin 12.0 - 15.0 g/dL 10.1(L) 10.5(L) 10.4(L)  Hematocrit 36.0 - 46.0 % 31.9(L) 32.3(L) 31.9(L)  Platelets 150 - 400 K/uL 115(L) 123(L) 119(L)  ANC 1300 . CBC    Component Value Date/Time   WBC 2.5 (L) 02/23/2019 1113   RBC 3.25 (L) 02/23/2019 1113   HGB 10.1 (L) 02/23/2019 1113   HGB 10.5 (L) 12/01/2018 1256   HGB 11.3 (L) 09/30/2017 1145   HCT 31.9 (L) 02/23/2019 1113   HCT 35.1 09/30/2017 1145   PLT 115 (L) 02/23/2019 1113   PLT 123 (L) 12/01/2018 1256   PLT 172 09/30/2017 1145   MCV 98.2 02/23/2019 1113   MCV 97.5 09/30/2017 1145   MCH 31.1 02/23/2019 1113   MCHC 31.7 02/23/2019 1113   RDW 13.9 02/23/2019 1113   RDW 14.0 09/30/2017 1145   LYMPHSABS 1.5 02/23/2019 1113   LYMPHSABS 1.3 09/30/2017 1145   MONOABS 0.2 02/23/2019 1113   MONOABS 0.2 09/30/2017 1145   EOSABS 0.0 02/23/2019 1113   EOSABS 0.0 09/30/2017 1145   EOSABS 0.1 06/03/2014 1003   BASOSABS 0.0 02/23/2019 1113   BASOSABS 0.0 09/30/2017 1145     . CMP Latest Ref Rng & Units 02/23/2019 11/24/2018 09/15/2018  Glucose 70 - 99 mg/dL 81 88 93  BUN 8 - 23 mg/dL 13 14 12   Creatinine 0.44 - 1.00 mg/dL 0.84 0.95 0.94  Sodium 135 - 145 mmol/L 140 141 143  Potassium 3.5 - 5.1 mmol/L 4.3 4.0 3.6  Chloride 98 - 111 mmol/L 107 107 113(H)  CO2 22 - 32 mmol/L 24 24 24   Calcium 8.9 - 10.3 mg/dL 9.4 9.5 8.9  Total Protein 6.5 - 8.1 g/dL 7.0 6.8 6.7  Total Bilirubin 0.3 - 1.2 mg/dL 0.8 0.7 0.8  Alkaline Phos 38 - 126 U/L 92 104 81  AST 15 - 41 U/L 29 26 25   ALT 0 - 44 U/L 38 30 24   Component     Latest Ref Rng & Units 11/08/2016  LDH     125 - 245 U/L 220  Beta 2     0.6 - 2.4 mg/L 2.5 (H)   Component     Latest Ref Rng & Units  07/30/2017  Ferritin     9 - 269 ng/ml 81  Vitamin B12     232 - 1,245 pg/mL 594         RADIOGRAPHIC STUDIES: I have personally reviewed the radiological images as listed and agreed with the findings in the report. No results found.   Bone survey 11/08/2016 IMPRESSION: 1. Small lytic lesion in the right scapula. 2. Possible small lytic lesion in the left scapula. 3. Postoperative changes. 4. Significant degenerative changes in both hips, left greater than right. 5. No evidence for acute fracture   Electronically Signed   By: Nolon Nations M.D.   On: 11/08/2016 16:19   ASSESSMENT & PLAN:   68 y.o. very pleasant lady with history of   1) Smoldering Multiple Myeloma vs Multiple myeloma (concern for small lytic lesions in left and right scapulae) - (appears light chain producing) This was apparently diagnosed in 2011 by her hematologist in [place]. No renal failure/hypercalcemia. Bone survey with concern fr possible small lytic lesions in B/L scapulae. PET/CT scan on 03/12/2017 showed no concerning bone lesions. Bm Bx with 17% kappa restricted plasma cells consistent with plasma cell neoplasm. No treatment so far.  2) Stable Kappa Lambda SFLC with increased K/L ratio - gradual increase  3) Chronic leukopenia - ? Related to SMM vs other nutritional deficiencies (h/o gastric bypass surgery put her at risk for nutritional deficiencies) B12 levels WNL Ferritin adequate ?additional factor -recent NSAID use. ?element of benign ethnic neutropenia. Has not had any issues with frequent infection.  ANC post-operative normalized today at 1700  4) Neuropathy/radiculopathy -Continue follow up with orthopedics  PLAN:  -Discussed pt labwork today, 02/23/19; Kappa light chains stable at 577.49m with K:L ratio at 46.18. Chemistries normal. HGB trending down, now to 10.1. ANC slightly lower at 800 with overall WBC lower at 2.5k. M protein at 0.3g. -Discussed CRAB  criteria: no hypercalcemia, normal renal function, however hemoglobin has been slowly trending down, and no history of bone tumors but worsened lower right back pain is noted -Regarding worsened back pain- will order MRI Lumbosacral -Regarding progressively worsening anemia- discussed repeating BM Bx (last was 2 years ago) vs continuing to observe with labs. Pt prefers to repeat BM Bx for more conclusive evaluation. -Continue 50k Ergocalciferol weekly -Continue with Iron Polysaccharide, Vitamin B complex and Vitamin B12 -Will speak with the pt again in 2 weeks with phone visit   MRI Lumbosacral spine in 1 week CT bone marrow biopsy in 1 week Telephone visit with Dr KIrene Limboin 2 weeks   All of the patients questions were answered with apparent satisfaction. The patient knows to call the clinic with any problems, questions or concerns.  The total time spent in the appt was 25 minutes and more than 50% was on counseling and direct patient cares.    GSullivan LoneMD MBenton RidgeAAHIVMS STexas Institute For Surgery At Texas Health Presbyterian DallasCLatimer County General HospitalHematology/Oncology Physician CK[facility] (Office):       [phone](Work cell):  [phone](Fax):           [phone] I, SBaldwin Jamaica am acting as  a scribe for Dr. Sullivan Lone.   .I have reviewed the above documentation for accuracy and completeness, and I agree with the above. Brunetta Genera MD

## 2019-03-02 ENCOUNTER — Other Ambulatory Visit: Payer: Self-pay

## 2019-03-02 ENCOUNTER — Telehealth (HOSPITAL_COMMUNITY): Payer: Self-pay

## 2019-03-02 ENCOUNTER — Telehealth: Payer: Self-pay | Admitting: Hematology

## 2019-03-02 ENCOUNTER — Inpatient Hospital Stay (HOSPITAL_BASED_OUTPATIENT_CLINIC_OR_DEPARTMENT_OTHER): Payer: Medicare Other | Admitting: Hematology

## 2019-03-02 VITALS — BP 134/78 | HR 78 | Temp 98.9°F | Resp 18 | Ht 63.0 in | Wt 215.8 lb

## 2019-03-02 DIAGNOSIS — G629 Polyneuropathy, unspecified: Secondary | ICD-10-CM

## 2019-03-02 DIAGNOSIS — E039 Hypothyroidism, unspecified: Secondary | ICD-10-CM

## 2019-03-02 DIAGNOSIS — Z9884 Bariatric surgery status: Secondary | ICD-10-CM | POA: Diagnosis not present

## 2019-03-02 DIAGNOSIS — M545 Low back pain, unspecified: Secondary | ICD-10-CM

## 2019-03-02 DIAGNOSIS — D472 Monoclonal gammopathy: Secondary | ICD-10-CM

## 2019-03-02 DIAGNOSIS — D649 Anemia, unspecified: Secondary | ICD-10-CM

## 2019-03-02 DIAGNOSIS — C9000 Multiple myeloma not having achieved remission: Secondary | ICD-10-CM

## 2019-03-02 DIAGNOSIS — Z79899 Other long term (current) drug therapy: Secondary | ICD-10-CM

## 2019-03-02 DIAGNOSIS — Z87891 Personal history of nicotine dependence: Secondary | ICD-10-CM

## 2019-03-02 DIAGNOSIS — D72819 Decreased white blood cell count, unspecified: Secondary | ICD-10-CM

## 2019-03-02 NOTE — Telephone Encounter (Signed)
Scheduled appt per 5/18 los. ° °Patient aware of appt date and time. °

## 2019-03-09 ENCOUNTER — Other Ambulatory Visit: Payer: Self-pay | Admitting: Radiology

## 2019-03-10 ENCOUNTER — Other Ambulatory Visit: Payer: Self-pay | Admitting: Student

## 2019-03-11 ENCOUNTER — Encounter (HOSPITAL_COMMUNITY): Payer: Self-pay

## 2019-03-11 ENCOUNTER — Other Ambulatory Visit: Payer: Self-pay

## 2019-03-11 ENCOUNTER — Ambulatory Visit (HOSPITAL_COMMUNITY)
Admission: RE | Admit: 2019-03-11 | Discharge: 2019-03-11 | Disposition: A | Discharge status: Home / Self Care | Payer: Medicare Other | Source: Ambulatory Visit | Attending: Hematology | Admitting: Hematology

## 2019-03-11 DIAGNOSIS — D649 Anemia, unspecified: Secondary | ICD-10-CM | POA: Diagnosis not present

## 2019-03-11 DIAGNOSIS — D72822 Plasmacytosis: Secondary | ICD-10-CM | POA: Insufficient documentation

## 2019-03-11 DIAGNOSIS — D472 Monoclonal gammopathy: Secondary | ICD-10-CM

## 2019-03-11 DIAGNOSIS — Z79899 Other long term (current) drug therapy: Secondary | ICD-10-CM | POA: Diagnosis not present

## 2019-03-11 DIAGNOSIS — C9000 Multiple myeloma not having achieved remission: Secondary | ICD-10-CM | POA: Diagnosis not present

## 2019-03-11 DIAGNOSIS — D61818 Other pancytopenia: Secondary | ICD-10-CM | POA: Diagnosis not present

## 2019-03-11 LAB — CBC WITH DIFFERENTIAL/PLATELET
Abs Immature Granulocytes: 0.01 10*3/uL (ref 0.00–0.07)
Basophils Absolute: 0 10*3/uL (ref 0.0–0.1)
Basophils Relative: 0 %
Eosinophils Absolute: 0 10*3/uL (ref 0.0–0.5)
Eosinophils Relative: 1 %
HCT: 32.3 % — ABNORMAL LOW (ref 36.0–46.0)
Hemoglobin: 10.3 g/dL — ABNORMAL LOW (ref 12.0–15.0)
Immature Granulocytes: 0 %
Lymphocytes Relative: 55 %
Lymphs Abs: 1.3 10*3/uL (ref 0.7–4.0)
MCH: 31.8 pg (ref 26.0–34.0)
MCHC: 31.9 g/dL (ref 30.0–36.0)
MCV: 99.7 fL (ref 80.0–100.0)
Monocytes Absolute: 0.2 10*3/uL (ref 0.1–1.0)
Monocytes Relative: 7 %
Neutro Abs: 0.9 10*3/uL — ABNORMAL LOW (ref 1.7–7.7)
Neutrophils Relative %: 37 %
Platelets: 124 10*3/uL — ABNORMAL LOW (ref 150–400)
RBC: 3.24 MIL/uL — ABNORMAL LOW (ref 3.87–5.11)
RDW: 14.6 % (ref 11.5–15.5)
WBC: 2.5 10*3/uL — ABNORMAL LOW (ref 4.0–10.5)
nRBC: 0 % (ref 0.0–0.2)

## 2019-03-11 LAB — PROTIME-INR
INR: 0.9 (ref 0.8–1.2)
Prothrombin Time: 12.3 seconds (ref 11.4–15.2)

## 2019-03-11 MED ORDER — SODIUM CHLORIDE 0.9 % IV SOLN
INTRAVENOUS | Status: DC
  Administered 2019-03-11: 10:00:00 via INTRAVENOUS

## 2019-03-11 MED ORDER — MIDAZOLAM HCL 2 MG/2ML IJ SOLN
INTRAMUSCULAR | Status: AC | PRN
  Administered 2019-03-11 (×3): 1 mg via INTRAVENOUS

## 2019-03-11 MED ORDER — FENTANYL CITRATE (PF) 100 MCG/2ML IJ SOLN
INTRAMUSCULAR | Status: AC | PRN
  Administered 2019-03-11 (×2): 50 ug via INTRAVENOUS

## 2019-03-11 MED ORDER — FENTANYL CITRATE (PF) 100 MCG/2ML IJ SOLN
INTRAMUSCULAR | Status: AC
  Filled 2019-03-11: qty 2

## 2019-03-11 MED ORDER — LIDOCAINE-EPINEPHRINE (PF) 1 %-1:200000 IJ SOLN
INTRAMUSCULAR | Status: AC | PRN
  Administered 2019-03-11: 10 mL

## 2019-03-11 MED ORDER — MIDAZOLAM HCL 2 MG/2ML IJ SOLN
INTRAMUSCULAR | Status: AC
  Filled 2019-03-11: qty 4

## 2019-03-11 NOTE — Procedures (Signed)
Pre-procedure Diagnosis: Multiple Myeloma Post-procedure Diagnosis: Same  Technically successful CT guided bone marrow aspiration and biopsy of left iliac crest.   Complications: None Immediate  EBL: None  Signed: Sandi Mariscal Pager: [phone] 03/11/2019, 11:30 AM

## 2019-03-11 NOTE — Discharge Instructions (Signed)
Bone Marrow Aspiration and Bone Marrow Biopsy, Adult, Care After This sheet gives you information about how to care for yourself after your procedure. Your health care provider may also give you more specific instructions. If you have problems or questions, contact your health care provider. What can I expect after the procedure? After the procedure, it is common to have:  Mild pain and tenderness.  Swelling.  Bruising. Follow these instructions at home: Puncture site care      Follow instructions from your health care provider about how to take care of the puncture site. Make sure you: ? Wash your hands with soap and water before you change your bandage (dressing). If soap and water are not available, use hand sanitizer. ? Change your dressing as told by your health care provider.  Check your puncture siteevery day for signs of infection. Check for: ? More redness, swelling, or pain. ? More fluid or blood. ? Warmth. ? Pus or a bad smell. General instructions  Take over-the-counter and prescription medicines only as told by your health care provider.  Do not take baths, swim, or use a hot tub until your health care provider approves. Ask if you can take a shower or have a sponge bath.  Return to your normal activities as told by your health care provider. Ask your health care provider what activities are safe for you.  Do not drive for 24 hours if you were given a medicine to help you relax (sedative) during your procedure.  Keep all follow-up visits as told by your health care provider. This is important. Contact a health care provider if:  Your pain is not controlled with medicine. Get help right away if:  You have a fever.  You have more redness, swelling, or pain around the puncture site.  You have more fluid or blood coming from the puncture site.  Your puncture site feels warm to the touch.  You have pus or a bad smell coming from the puncture site. These  symptoms may represent a serious problem that is an emergency. Do not wait to see if the symptoms will go away. Get medical help right away. Call your local emergency services (911 in the [place] Do not drive yourself to the hospital. Summary  After the procedure, it is common to have mild pain, tenderness, swelling, and bruising.  Follow instructions from your health care provider about how to take care of the puncture site.  Get help right away if you have any symptoms of infection or if you have more blood or fluid coming from the puncture site. This information is not intended to replace advice given to you by your health care provider. Make sure you discuss any questions you have with your health care provider. Document Released: 04/20/2005 Document Revised: 01/14/2018 Document Reviewed: 03/14/2016 Elsevier Interactive Patient Education  2019 [employer]. Moderate Conscious Sedation, Adult, Care After These instructions provide you with information about caring for yourself after your procedure. Your health care provider may also give you more specific instructions. Your treatment has been planned according to current medical practices, but problems sometimes occur. Call your health care provider if you have any problems or questions after your procedure. What can I expect after the procedure? After your procedure, it is common:  To feel sleepy for several hours.  To feel clumsy and have poor balance for several hours.  To have poor judgment for several hours.  To vomit if you eat too soon. Follow these instructions  at home: For at least 24 hours after the procedure:   Do not: ? Participate in activities where you could fall or become injured. ? Drive. ? Use heavy machinery. ? Drink alcohol. ? Take sleeping pills or medicines that cause drowsiness. ? Make important decisions or sign legal documents. ? Take care of children on your own.  Rest. Eating and drinking  Follow  the diet recommended by your health care provider.  If you vomit: ? Drink water, juice, or soup when you can drink without vomiting. ? Make sure you have little or no nausea before eating solid foods. General instructions  Have a responsible adult stay with you until you are awake and alert.  Take over-the-counter and prescription medicines only as told by your health care provider.  If you smoke, do not smoke without supervision.  Keep all follow-up visits as told by your health care provider. This is important. Contact a health care provider if:  You keep feeling nauseous or you keep vomiting.  You feel light-headed.  You develop a rash.  You have a fever. Get help right away if:  You have trouble breathing. This information is not intended to replace advice given to you by your health care provider. Make sure you discuss any questions you have with your health care provider. Document Released: 07/22/2013 Document Revised: 03/05/2016 Document Reviewed: 01/21/2016 Elsevier Interactive Patient Education  2019 [employer].

## 2019-03-11 NOTE — H&P (Signed)
Referring Physician(s): Brunetta Genera  Supervising Physician: Sandi Mariscal  Patient Status:  [facility] OP  Chief Complaint:  "I'm having a bone marrow biopsy"  Subjective: Patient familiar to IR service from prior bone marrow biopsy in 2018.  She has a history of smoldering multiple myeloma and now with worsening anemia.  She presents again today for repeat CT-guided bone marrow biopsy to rule out progression to active myeloma.  She currently denies fever, headache, chest pain, dyspnea, cough, abdominal pain, nausea, vomiting or bleeding.  She does have some intermittent back pain.   Past Medical History:  Diagnosis Date  . Headache   . Low back pain   . SBO (small bowel obstruction) ([place]) 2010  . Smoldering multiple myeloma ([place])    Past Surgical History:  Procedure Laterality Date  . ABDOMINAL HYSTERECTOMY     complete  . BACK SURGERY     lower at [facility]  . colonscopy  2014  . GASTRIC BYPASS  yrs ago  . HERNIA REPAIR    . KNEE SURGERY  06/04/2009   both knees replaced   . TONSILLECTOMY  age 22  . TOTAL HIP ARTHROPLASTY Left 08/16/2017   Procedure: LEFT TOTAL HIP ARTHROPLASTY ANTERIOR APPROACH;  Surgeon: Dorna Leitz, MD;  Location: [facility];  Service: Orthopedics;  Laterality: Left;     Allergies: Codeine  Medications: Prior to Admission medications   Medication Sig Start Date End Date Taking? Authorizing Provider  B Complex-C (B-COMPLEX WITH VITAMIN C) tablet Take 1 tablet by mouth daily.    [provider]  ergocalciferol (VITAMIN D2) 1.25 MG (50000 UT) capsule Take 1 capsule (50,000 Units total) by mouth once a week. 12/01/18   Brunetta Genera, MD  Potassium 99 MG TABS Take 99 mg by mouth daily.    [provider]  vitamin B-12 (CYANOCOBALAMIN) 1000 MCG tablet Take 5,000 mcg by mouth daily.     [provider]     Vital Signs: Blood pressure 128/77, heart rate 64, respirations 18, O2 sat 100% room air, temp 98.1   Physical  Exam awake, alert.  Chest clear to auscultation bilaterally.  Heart with regular rate and rhythm.  Abdomen soft, positive bowel sounds, nontender.  No lower extremity edema.  Imaging: No results found.  Labs:  CBC: Recent Labs    05/22/18 1142 09/15/18 1143 12/01/18 1256 02/23/19 1113  WBC 2.7* 2.6* 3.1* 2.5*  HGB 11.1* 10.4* 10.5* 10.1*  HCT 33.8* 31.9* 32.3* 31.9*  PLT 120* 119* 123* 115*    COAGS: No results for input(s): INR, APTT in the last 8760 hours.  BMP: Recent Labs    05/22/18 1142 09/15/18 1143 11/24/18 1351 02/23/19 1113  NA 140 143 141 140  K 4.1 3.6 4.0 4.3  CL 107 113* 107 107  CO2 _0 GLUCOSE 95 93 88 81  BUN _1 CALCIUM 9.8 8.9 9.5 9.4  CREATININE 0.82 0.94 0.95 0.84  GFRNONAA >60 >60 >60 >60  GFRAA >60 >60 >60 >60    LIVER FUNCTION TESTS: Recent Labs    05/22/18 1142 09/15/18 1143 11/24/18 1351 02/23/19 1113  BILITOT 1.1 0.8 0.7 0.8  AST _2 ALT _3 38  ALKPHOS 100 81 104 92  PROT 7.2 6.7 6.8 7.0  ALBUMIN 4.2 3.8 4.2 4.1    Assessment and Plan: Patient with history of smoldering myeloma; now with worsening anemia; presents today for CT-guided bone marrow biopsy  to rule out progression to active myeloma.Risks and benefits of procedure was discussed with the patient  including, but not limited to bleeding, infection, damage to adjacent structures or low yield requiring additional tests.  All of the questions were answered and there is agreement to proceed.  Consent signed and in chart.  LABS PENDING   Electronically Signed: D. Rowe Robert, PA-C 03/11/2019, 9:06 AM   I spent a total of 20 minutes at the the patient's bedside AND on the patient's hospital floor or unit, greater than 50% of which was counseling/coordinating care for CT-guided bone marrow biopsy

## 2019-03-16 ENCOUNTER — Ambulatory Visit (HOSPITAL_COMMUNITY)
Admission: RE | Admit: 2019-03-16 | Discharge: 2019-03-16 | Disposition: A | Discharge status: Home / Self Care | Payer: Medicare Other | Source: Ambulatory Visit | Attending: Hematology | Admitting: Hematology

## 2019-03-16 ENCOUNTER — Other Ambulatory Visit: Payer: Self-pay

## 2019-03-16 DIAGNOSIS — M545 Low back pain, unspecified: Secondary | ICD-10-CM

## 2019-03-16 DIAGNOSIS — D472 Monoclonal gammopathy: Secondary | ICD-10-CM

## 2019-03-16 DIAGNOSIS — M48061 Spinal stenosis, lumbar region without neurogenic claudication: Secondary | ICD-10-CM | POA: Diagnosis not present

## 2019-03-16 DIAGNOSIS — C9000 Multiple myeloma not having achieved remission: Secondary | ICD-10-CM

## 2019-03-16 DIAGNOSIS — M4804 Spinal stenosis, thoracic region: Secondary | ICD-10-CM | POA: Diagnosis not present

## 2019-03-16 MED ORDER — GADOBUTROL 1 MMOL/ML IV SOLN
10.0000 mL | Freq: Once | INTRAVENOUS | Status: AC | PRN
  Administered 2019-03-16: 10 mL via INTRAVENOUS

## 2019-03-17 NOTE — Progress Notes (Signed)
Marland Kitchen    HEMATOLOGY/ONCOLOGY CLINIC NOTE  Date of Service: 03/18/19     PCP: Marzetta Board MD  CHIEF COMPLAINTS/PURPOSE OF CONSULTATION:  Continued f/u for smoldering myeloma  HISTORY OF PRESENTING ILLNESS:   Madison Parker is a wonderful 68 y.o. 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  Patient has a history of smoldering multiple myeloma which she reports she was diagnosed with in 2011 when she was evaluated by a hematologist in [place]. She reports that she presented with low blood counts (low WBC)   and after significant workup she had a bone marrow examination based on which she was told that she has smoldering multiple myeloma. Patient notes that she had a bone survey at that time which was negative. She was monitored there closely and then moved to [facility] in 2015 to stay with her son whose wife was being treated for breast cancer. Patient was following with Dr. Delight Hoh in [place]. She reports not having had any treatment for multiple myeloma.  Her last labs from about a year ago showed a SPEP with no OBSERVED M spike . Serum free light chains showed an elevation of kappa Free light chain 310 and lambda free light chain of 12.7 with an abnormal Kappa/ Lambda ratio of 24.38 ( up from 19 previously). Random urine showed an M protein complement of 44%.  She lives in [place] and requested transfer of care to [place]. She was offered follow-up but [facility] in [place]  But prefers to  follow [place] in [place]. Patient reports her energy levels been stable. She has chronic back pain which has improved since the patient had her spinal surgery in April 2017 (L4-L5 interbody fusion). Patient notes she has had some chronic left hip pain which is somewhat more bothersome with the navy on the lateral aspect of her upper thighs that's painful. She also notes some right hip  pain. Over the last few weeks he notes some upper neck pain especially when bending her neck backwards and tingling numbness in her right upper extremity.  Has been working on losing some weight voluntarily.  No acute other new symptoms.  INTERVAL HISTORY  I connected with [name] on 03/18/19 at  9:00 AM EDT by telephone and verified that I am speaking with the correct person using two identifiers.  I discussed the limitations, risks, security and privacy concerns of performing an evaluation and management service by telemedicine and the availability of in-person appointments. I also discussed with the patient that there may be a patient responsible charge related to this service. The patient expressed understanding and agreed to proceed.   Other persons participating in the visit and their role in the encounter: none  Patient's location: her home Provider's location: my office at the [place] is called today for f/u for her smoldering multiple myeloma. The patient's last visit with [place] was on 03/02/19. The pt reports that she is doing well overall.  The pt reports that she has been "pushing through" her lower back pain to accomplish her daily activities. The pt notes that the bottom of her feet do feel numbness and occasional tingling, when she lies down. She previously took Tazanidine for this but no longer takes this because it dropped her BP. The pt denies ever having had a blood clot.   The pt endorses stable energy levels overall.  Of note since the patient's last visit, pt has had an MRI Lumbar spine completed on 03/16/19 with results revealing "No evidence of multiple myeloma. 2. Severe spinal canal and moderate to severe right neuroforaminal stenosis at L2-L3. 3. Severe left and moderate right neuroforaminal stenosis at L4-L5. 4. Prior L3-L4 PLIF with residual mild to moderate right neuroforaminal stenosis. 5. Severe left neuroforaminal stenosis at  T11-T12 due to facet arthropathy."  Lab results (03/11/19) of CBC w/diff is as follows: all values are WNL except for WBC at 2.5k, RBC at 3.24, HGB at 10.3, HCT at 32.3, PLT at 124k, ANC at 900.  On review of systems, pt reports stable lower back pain, stable energy levels, and denies concerns for infections, and any other symptoms.   MEDICAL HISTORY:  Past Medical History:  Diagnosis Date   Headache    Low back pain    SBO (small bowel obstruction) ([place]) 2010   Smoldering multiple myeloma ([place])   Previous history of hypothyroidism- not currently medications Anxiety Obesity .There is no height or weight on file to calculate BMI. Vitamin D deficiency Smoldering multiple myeloma diagnosed in 2011 Lumbosacral radiculopathy Left hip pain  SURGICAL HISTORY: Past Surgical History:  Procedure Laterality Date   ABDOMINAL HYSTERECTOMY     complete   BACK SURGERY     lower at [facility]   colonscopy  2014   GASTRIC BYPASS  yrs ago   [place]  06/04/2009   both knees replaced    TONSILLECTOMY  age 79   TOTAL HIP ARTHROPLASTY Left 08/16/2017   Procedure: LEFT TOTAL HIP ARTHROPLASTY ANTERIOR APPROACH;  Surgeon: Dorna Leitz, MD;  Location: [facility];  Service: Orthopedics;  Laterality: Left;  Spinal surgery April 2017  SOCIAL HISTORY: Social History   Socioeconomic History   Marital status: Married    Spouse name: Not on file   Number of children: Not on file   Years of education: Not on file   Highest education level: Not on file  Occupational History   Not on file  Social Needs   Financial resource strain: Not on file   Food insecurity:    Worry: Not on file    Inability: Not on file   Transportation needs:    Medical: Not on file    Non-medical: Not on file  Tobacco Use   Smoking status: Former Smoker    Packs/day: 0.50    Years: 10.00    Pack years: 5.00    Types: Cigarettes   Smokeless tobacco: Never Used   Tobacco comment:  quit 1977  Substance and Sexual Activity   Alcohol use: Yes    Comment: occ   Drug use: No   Sexual activity: Not on file  Lifestyle   Physical activity:    Days per week: Not on file    Minutes per session: Not on file   Stress: Not on file  Relationships   Social connections:    Talks on phone: Not on file    Gets together: Not on file    Attends religious service: Not on file    Active member of club or organization: Not on file    Attends meetings of clubs or organizations: Not on file    Relationship status: Not on file   Intimate partner violence:    Fear of current or ex partner: Not on file    Emotionally abused: Not on file    Physically abused: Not on file  Forced sexual activity: Not on file  Other Topics Concern   Not on file  Social History Narrative   Not on file  Occasional alcohol use Former smoker and smoked 1 pack per week for about 9 years has since quit.  FAMILY HISTORY: No family history on file.  ALLERGIES:  is allergic to codeine.  MEDICATIONS:  Current Outpatient Medications  Medication Sig Dispense Refill   B Complex-C (B-COMPLEX WITH VITAMIN C) tablet Take 1 tablet by mouth daily.     ergocalciferol (VITAMIN D2) 1.25 MG (50000 UT) capsule Take 1 capsule (50,000 Units total) by mouth once a week. 12 capsule 1   Potassium 99 MG TABS Take 99 mg by mouth daily.     vitamin B-12 (CYANOCOBALAMIN) 1000 MCG tablet Take 5,000 mcg by mouth daily.      No current facility-administered medications for this visit.     REVIEW OF SYSTEMS:    A 10+ POINT REVIEW OF SYSTEMS WAS OBTAINED including neurology, dermatology, psychiatry, cardiac, respiratory, lymph, extremities, GI, GU, Musculoskeletal, constitutional, breasts, reproductive, HEENT.  All pertinent positives are noted in the HPI.  All others are negative.   PHYSICAL EXAMINATION: ECOG PERFORMANCE STATUS: 1 - Symptomatic but completely ambulatory  There were no vitals filed for this  visit. There were no vitals filed for this visit. .There is no height or weight on file to calculate BMI.  Phone visit  LABORATORY DATA:  I have reviewed the data as listed  . CBC Latest Ref Rng & Units 03/11/2019 02/23/2019 12/01/2018  WBC 4.0 - 10.5 K/uL 2.5(L) 2.5(L) 3.1(L)  Hemoglobin 12.0 - 15.0 g/dL 10.3(L) 10.1(L) 10.5(L)  Hematocrit 36.0 - 46.0 % 32.3(L) 31.9(L) 32.3(L)  Platelets 150 - 400 K/uL 124(L) 115(L) 123(L)  ANC 1300 . CBC    Component Value Date/Time   WBC 2.5 (L) 03/11/2019 0938   RBC 3.24 (L) 03/11/2019 0938   HGB 10.3 (L) 03/11/2019 0938   HGB 10.5 (L) 12/01/2018 1256   HGB 11.3 (L) 09/30/2017 1145   HCT 32.3 (L) 03/11/2019 0938   HCT 35.1 09/30/2017 1145   PLT 124 (L) 03/11/2019 0938   PLT 123 (L) 12/01/2018 1256   PLT 172 09/30/2017 1145   MCV 99.7 03/11/2019 0938   MCV 97.5 09/30/2017 1145   MCH 31.8 03/11/2019 0938   MCHC 31.9 03/11/2019 0938   RDW 14.6 03/11/2019 0938   RDW 14.0 09/30/2017 1145   LYMPHSABS 1.3 03/11/2019 0938   LYMPHSABS 1.3 09/30/2017 1145   MONOABS 0.2 03/11/2019 0938   MONOABS 0.2 09/30/2017 1145   EOSABS 0.0 03/11/2019 0938   EOSABS 0.0 09/30/2017 1145   EOSABS 0.1 06/03/2014 1003   BASOSABS 0.0 03/11/2019 0938   BASOSABS 0.0 09/30/2017 1145     . CMP Latest Ref Rng & Units 02/23/2019 11/24/2018 09/15/2018  Glucose 70 - 99 mg/dL 81 88 93  BUN 8 - 23 mg/dL 13 14 12   Creatinine 0.44 - 1.00 mg/dL 0.84 0.95 0.94  Sodium 135 - 145 mmol/L 140 141 143  Potassium 3.5 - 5.1 mmol/L 4.3 4.0 3.6  Chloride 98 - 111 mmol/L 107 107 113(H)  CO2 22 - 32 mmol/L 24 24 24   Calcium 8.9 - 10.3 mg/dL 9.4 9.5 8.9  Total Protein 6.5 - 8.1 g/dL 7.0 6.8 6.7  Total Bilirubin 0.3 - 1.2 mg/dL 0.8 0.7 0.8  Alkaline Phos 38 - 126 U/L 92 104 81  AST 15 - 41 U/L 29 26 25   ALT 0 - 44 U/L  38 30 24   Component     Latest Ref Rng & Units 11/08/2016  LDH     125 - 245 U/L 220  Beta 2     0.6 - 2.4 mg/L 2.5 (H)   Component     Latest Ref Rng &  Units 07/30/2017  Ferritin     9 - 269 ng/ml 81  Vitamin B12     232 - 1,245 pg/mL 594    03/11/19 BM Bx:       RADIOGRAPHIC STUDIES: I have personally reviewed the radiological images as listed and agreed with the findings in the report. Mr Lumbar Spine W Wo Contrast  Result Date: 03/16/2019 CLINICAL DATA:  Chronic back pain radiating into the buttocks for the past 6 months. Prior fusion 3 years ago. History of smoldering multiple myeloma. EXAM: MRI LUMBAR SPINE WITHOUT AND WITH CONTRAST TECHNIQUE: Multiplanar and multiecho pulse sequences of the lumbar spine were obtained without and with intravenous contrast. CONTRAST:  10 mL Gadavist intravenous contrast. COMPARISON:  PET-CT dated Mar 12, 2017. FINDINGS: Segmentation: Transitional lumbosacral anatomy with sacralization of L5 and rudimentary L5-S1 disc space. Alignment:  Trace anterolisthesis at L2-L3, L3-L4, and L4-L5. Vertebrae: Prior L3-L4 PLIF. No fracture, evidence of discitis, or focal bone lesion. Degenerative endplate marrow edema at L2-L3 and L4-L5. Conus medullaris and cauda equina: Conus extends to the L1 level. Conus and cauda equina appear normal. Paraspinal and other soft tissues: Negative. Disc levels: T9-T10: Negative disc.  Left facet arthropathy.  No stenosis. T10-T11: Negative disc.  Right facet arthropathy.  No stenosis. T11-T12: Tiny central disc protrusion. Left greater than right facet arthropathy. Severe left neuroforaminal stenosis. No spinal canal or right neuroforaminal stenosis. T12-L1: No significant disc bulge or herniation. Mild bilateral facet arthropathy. No stenosis. L1-L2: No significant disc bulge or herniation. Mild bilateral facet arthropathy. Borderline mild bilateral neuroforaminal stenosis. No spinal canal stenosis. L2-L3: Disc bulging asymmetric to the right and severe bilateral facet arthropathy. Severe spinal canal stenosis. Moderate to severe right and mild left neuroforaminal stenosis. L3-L4: Prior  PLIF. Residual mild to moderate right neuroforaminal stenosis due to bony hypertrophy. No spinal canal or left neuroforaminal stenosis. L4-L5: Diffuse disc bulging with superimposed left foraminal disc protrusion. Severe bilateral facet arthropathy. Mild spinal canal stenosis. Severe left and moderate right neuroforaminal stenosis. L5-S1:  Rudimentary disc space.  No stenosis. IMPRESSION: 1. No evidence of multiple myeloma. 2. Severe spinal canal and moderate to severe right neuroforaminal stenosis at L2-L3. 3. Severe left and moderate right neuroforaminal stenosis at L4-L5. 4. Prior L3-L4 PLIF with residual mild to moderate right neuroforaminal stenosis. 5. Severe left neuroforaminal stenosis at T11-T12 due to facet arthropathy. Electronically Signed   By: Titus Dubin M.D.   On: 03/16/2019 14:16   Ct Biopsy  Result Date: 03/11/2019 INDICATION: History of multiple myeloma. Please perform CT-guided bone marrow biopsy for tissue diagnostic purposes. EXAM: CT-GUIDED BONE MARROW BIOPSY AND ASPIRATION MEDICATIONS: None ANESTHESIA/SEDATION: Fentanyl 100 mcg IV; Versed 3 mg IV Sedation Time: 10 Minutes; The patient was continuously monitored during the procedure by the interventional radiology nurse under my direct supervision. COMPLICATIONS: None immediate. PROCEDURE: Informed consent was obtained from the patient following an explanation of the procedure, risks, benefits and alternatives. The patient understands, agrees and consents for the procedure. All questions were addressed. A time out was performed prior to the initiation of the procedure. The patient was positioned prone and non-contrast localization CT was performed of the pelvis to demonstrate the iliac marrow  spaces. The operative site was prepped and draped in the usual sterile fashion. Under sterile conditions and local anesthesia, a 22 gauge spinal needle was utilized for procedural planning. Next, an 11 gauge coaxial bone biopsy needle was advanced  into the left iliac marrow space. Needle position was confirmed with CT imaging. Initially, bone marrow aspiration was performed. Next, a bone marrow biopsy was obtained with the 11 gauge outer bone marrow device. Samples were prepared with the cytotechnologist and deemed adequate. The needle was removed intact. Hemostasis was obtained with compression and a dressing was placed. The patient tolerated the procedure well without immediate post procedural complication. IMPRESSION: Successful CT guided left iliac bone marrow aspiration and core biopsy. Electronically Signed   By: Sandi Mariscal M.D.   On: 03/11/2019 12:20   Ct Bone Marrow Biopsy & Aspiration  Result Date: 03/11/2019 INDICATION: History of multiple myeloma. Please perform CT-guided bone marrow biopsy for tissue diagnostic purposes. EXAM: CT-GUIDED BONE MARROW BIOPSY AND ASPIRATION MEDICATIONS: None ANESTHESIA/SEDATION: Fentanyl 100 mcg IV; Versed 3 mg IV Sedation Time: 10 Minutes; The patient was continuously monitored during the procedure by the interventional radiology nurse under my direct supervision. COMPLICATIONS: None immediate. PROCEDURE: Informed consent was obtained from the patient following an explanation of the procedure, risks, benefits and alternatives. The patient understands, agrees and consents for the procedure. All questions were addressed. A time out was performed prior to the initiation of the procedure. The patient was positioned prone and non-contrast localization CT was performed of the pelvis to demonstrate the iliac marrow spaces. The operative site was prepped and draped in the usual sterile fashion. Under sterile conditions and local anesthesia, a 22 gauge spinal needle was utilized for procedural planning. Next, an 11 gauge coaxial bone biopsy needle was advanced into the left iliac marrow space. Needle position was confirmed with CT imaging. Initially, bone marrow aspiration was performed. Next, a bone marrow biopsy was  obtained with the 11 gauge outer bone marrow device. Samples were prepared with the cytotechnologist and deemed adequate. The needle was removed intact. Hemostasis was obtained with compression and a dressing was placed. The patient tolerated the procedure well without immediate post procedural complication. IMPRESSION: Successful CT guided left iliac bone marrow aspiration and core biopsy. Electronically Signed   By: Sandi Mariscal M.D.   On: 03/11/2019 12:20     Bone survey 11/08/2016 IMPRESSION: 1. Small lytic lesion in the right scapula. 2. Possible small lytic lesion in the left scapula. 3. Postoperative changes. 4. Significant degenerative changes in both hips, left greater than right. 5. No evidence for acute fracture   Electronically Signed   By: Nolon Nations M.D.   On: 11/08/2016 16:19   ASSESSMENT & PLAN:   68 y.o. very pleasant lady with history of   1) Smoldering Multiple Myeloma vs Multiple myeloma (concern for small lytic lesions in left and right scapulae) - (appears light chain producing) This was apparently diagnosed in 2011 by her hematologist in [place]. No renal failure/hypercalcemia. Bone survey with concern for possible small lytic lesions in B/L scapulae. PET/CT scan on 03/12/2017 showed no concerning bone lesions. Bm Bx with 17% kappa restricted plasma cells consistent with plasma cell neoplasm. No treatment so far.  2) Stable Kappa Lambda SFLC with increased K/L ratio - gradual increase  3) Chronic leukopenia - ? Related to SMM vs other nutritional deficiencies (h/o gastric bypass surgery put her at risk for nutritional deficiencies) B12 levels WNL Ferritin adequate ?additional factor -recent NSAID  use. ?element of benign ethnic neutropenia. Has not had any issues with frequent infection.  ANC post-operative normalized today at 1700  4) Neuropathy/radiculopathy -Continue follow up with orthopedics  PLAN:  -Labwork from 02/23/19; Kappa light  chains stable at 577.51m with K:L ratio at 46.18. Chemistries normal. HGB trending down, now to 10.1. ANC slightly lower at 800 with overall WBC lower at 2.5k. M protein at 0.3g. -Discussed that her light chain ratio has increased, alongside slowly decreasing HGB -Discussed the 03/16/19 MRI Lumbar which revealed "No evidence of multiple myeloma. 2. Severe spinal canal and moderate to severe right neuroforaminal stenosis at L2-L3. 3. Severe left and moderate right neuroforaminal stenosis at L4-L5. 4. Prior L3-L4 PLIF with residual mild to moderate right neuroforaminal stenosis. 5. Severe left neuroforaminal stenosis at T11-T12 due to facet arthropathy." -Discussed the 03/11/19 BM Bx which revealed involvement by 50% plasma cells; genetics are pending, previous genetics from April 2018 BM Bx were standard risk. -Discussed that the MRI Lumbar reveals several explanations for her lower back pain including L2-L3 stenosis, but reassuringly, there was not evidence of involvement by multiple myeloma -Pt is already established with orthopedics and will discuss these findings with her orthopedist  -Discussed CRAB criteria: no hypercalcemia, normal renal function, however hemoglobin has been slowly trending down, and no history or current evidence of bone tumors -Discussed that the patient now meets criteria to consider initiating treatment give her worsening anemia alongside increased plasma cell bone marrow involvement between the April 2018 and June 2020 BM biopsies -Discussed that final treatment recommendation will be informed by obtaining PET/CT which I will order, and to rule out bone tumors -Discussed that treatment choice will also consider her radiculopathy, and would likely include Ninlaro and Dexamethasone, and possibly Revlimid as well after initial tolerance is displayed. Discussed possible side effects of these treatments including shingles outbreaks and blood clots; risks which will be mitigated by  prophylactic Acyclovir BID and 857maspirin respectively. -Discussed that the goals of treatment would be to decrease the plasma cell involvement in the BM, to improve her light chain burden, and improve her anemia as well. -Will set the pt up for chemotherapy counseling -Will order 24hr UPEP -Continue 50k Ergocalciferol weekly -Continue Iron Polysaccharide, Vitamin B complex and Vitamin B12 -Will see the pt back in 4 weeks   PET/CT in 2 weeks and lab for urine testing in 2weeks RTC with Dr KaIrene Limboith labs in 4 weeks   I discussed the assessment and treatment plan with the patient. The patient was provided an opportunity to ask questions and all were answered. The patient agreed with the plan and demonstrated an understanding of the instructions.   The patient was advised to call back or seek an in-person evaluation if the symptoms worsen or if the condition fails to improve as anticipated.  The total time spent in the appt was 30 minutes and more than 50% was on counseling and direct patient cares.    GaSullivan LoneD MSEast LiverpoolAHIVMS SCVa Central Ar. [facility] LrTKlickitat Valley Healthematology/Oncology Physician CoMaricopa Medical Center(Office):       [phone] cell):  [phone]):           [phone], ScBaldwin Jamaicaam acting as a scribe for Dr. GaSullivan Lone  .I have reviewed the above documentation for accuracy and completeness, and I agree with the above. .GBrunetta GeneraD

## 2019-03-18 ENCOUNTER — Inpatient Hospital Stay: Payer: Medicare Other | Attending: Hematology | Admitting: Hematology

## 2019-03-18 DIAGNOSIS — Z87891 Personal history of nicotine dependence: Secondary | ICD-10-CM | POA: Diagnosis not present

## 2019-03-18 DIAGNOSIS — C9000 Multiple myeloma not having achieved remission: Secondary | ICD-10-CM

## 2019-03-19 ENCOUNTER — Encounter (HOSPITAL_COMMUNITY): Payer: Self-pay | Admitting: Hematology

## 2019-03-19 ENCOUNTER — Telehealth: Payer: Self-pay | Admitting: Hematology

## 2019-03-19 NOTE — Telephone Encounter (Signed)
Scheduled appt per 6/3 los. °

## 2019-04-01 ENCOUNTER — Other Ambulatory Visit: Payer: Medicare Other

## 2019-04-01 ENCOUNTER — Other Ambulatory Visit: Payer: Self-pay

## 2019-04-01 ENCOUNTER — Inpatient Hospital Stay: Payer: Medicare Other

## 2019-04-01 ENCOUNTER — Encounter (HOSPITAL_COMMUNITY)
Admission: RE | Admit: 2019-04-01 | Discharge: 2019-04-01 | Disposition: A | Discharge status: Home / Self Care | Payer: Medicare Other | Source: Ambulatory Visit | Attending: Hematology | Admitting: Hematology

## 2019-04-01 DIAGNOSIS — Z87891 Personal history of nicotine dependence: Secondary | ICD-10-CM | POA: Diagnosis not present

## 2019-04-01 DIAGNOSIS — Z79899 Other long term (current) drug therapy: Secondary | ICD-10-CM | POA: Insufficient documentation

## 2019-04-01 DIAGNOSIS — E559 Vitamin D deficiency, unspecified: Secondary | ICD-10-CM | POA: Diagnosis not present

## 2019-04-01 DIAGNOSIS — C9000 Multiple myeloma not having achieved remission: Secondary | ICD-10-CM

## 2019-04-01 LAB — GLUCOSE, CAPILLARY: Glucose-Capillary: 88 mg/dL (ref 70–99)

## 2019-04-01 MED ORDER — FLUDEOXYGLUCOSE F - 18 (FDG) INJECTION
10.7000 | Freq: Once | INTRAVENOUS | Status: AC | PRN
  Administered 2019-04-01: 10.7 via INTRAVENOUS

## 2019-04-02 DIAGNOSIS — L258 Unspecified contact dermatitis due to other agents: Secondary | ICD-10-CM | POA: Diagnosis not present

## 2019-04-03 DIAGNOSIS — C9000 Multiple myeloma not having achieved remission: Secondary | ICD-10-CM | POA: Diagnosis not present

## 2019-04-06 LAB — UPEP/UIFE/LIGHT CHAINS/TP, 24-HR UR
% BETA, Urine: 22.2 %
ALPHA 1 URINE: 1.3 %
Albumin, U: 5.5 %
Alpha 2, Urine: 8.1 %
Free Kappa Lt Chains,Ur: 3896.02 mg/L — ABNORMAL HIGH (ref 0.63–113.79)
Free Kappa/Lambda Ratio: 375.7 — ABNORMAL HIGH (ref 1.03–31.76)
Free Lambda Lt Chains,Ur: 10.37 mg/L (ref 0.47–11.77)
GAMMA GLOBULIN URINE: 63 %
M-SPIKE %, Urine: 53.4 % — ABNORMAL HIGH
M-Spike, Mg/24 Hr: 396 mg/24 hr — ABNORMAL HIGH
Total Protein, Urine-Ur/day: 741 mg/24 hr — ABNORMAL HIGH (ref 30–150)
Total Protein, Urine: 78 mg/dL

## 2019-04-10 ENCOUNTER — Other Ambulatory Visit: Payer: Medicare Other

## 2019-04-10 ENCOUNTER — Other Ambulatory Visit: Payer: Self-pay | Admitting: Internal Medicine

## 2019-04-10 DIAGNOSIS — Z20822 Contact with and (suspected) exposure to covid-19: Secondary | ICD-10-CM

## 2019-04-10 DIAGNOSIS — R6889 Other general symptoms and signs: Secondary | ICD-10-CM | POA: Diagnosis not present

## 2019-04-14 NOTE — Progress Notes (Signed)
Marland Kitchen    HEMATOLOGY/ONCOLOGY CLINIC NOTE  Date of Service: 04/15/19     PCP: Marzetta Board MD  CHIEF COMPLAINTS/PURPOSE OF CONSULTATION:  Continued f/u for smoldering myeloma  HISTORY OF PRESENTING ILLNESS:   Madison Parker is a wonderful 68 y.o. 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  Patient has a history of smoldering multiple myeloma which she reports she was diagnosed with in 2011 when she was evaluated by a hematologist in [place]. She reports that she presented with low blood counts (low WBC)   and after significant workup she had a bone marrow examination based on which she was told that she has smoldering multiple myeloma. Patient notes that she had a bone survey at that time which was negative. She was monitored there closely and then moved to [facility] in 2015 to stay with her son whose wife was being treated for breast cancer. Patient was following with Dr. Delight Hoh in [place]. She reports not having had any treatment for multiple myeloma.  Her last labs from about a year ago showed a SPEP with no OBSERVED M spike . Serum free light chains showed an elevation of kappa Free light chain 310 and lambda free light chain of 12.7 with an abnormal Kappa/ Lambda ratio of 24.38 ( up from 19 previously). Random urine showed an M protein complement of 44%.  She lives in [place] and requested transfer of care to [place]. She was offered follow-up but [place] in [place]  But prefers to  follow [place] in [place]. Patient reports her energy levels been stable. She has chronic back pain which has improved since the patient had her spinal surgery in April 2017 (L4-L5 interbody fusion). Patient notes she has had some chronic left hip pain which is somewhat more bothersome with the navy on the lateral aspect of her upper thighs that's painful. She also notes some right hip  pain. Over the last few weeks he notes some upper neck pain especially when bending her neck backwards and tingling numbness in her right upper extremity.  Has been working on losing some weight voluntarily.  No acute other new symptoms.  INTERVAL HISTORY:   Madison Parker returns today for f/u for her smoldering multiple myeloma. The patient's last visit with [place] was on 03/18/19. The pt reports that she is doing well overall.  The pt reports that she continues having neuropathy in the sole of her left foot. She denies any leg swelling or abdominal pains. She denies any new bone pains or back pains. She is connected with an orthopedist at [facility]. She endorses stable energy levels overall.  Of note since the patient's last visit, pt has had a PET/CT completed on 04/01/19 with results revealing "No FDG evidence of active multiple myeloma within the skeleton. 2. No evidence of lytic lesions within the skeleton or soft tissue Plasmacytoma."  Lab results today (04/15/19) of CBC w/diff and CMP is as follows: all values are WNL except for WBC at 2.5k, RBC at 3.13, HGB at 9.8, HCT at 30.3, PLT at 121k, nRBC at 0.8%, ANC at 900, Calcium at 8.8. 04/15/19 SFLC are pending 04/03/19 UPEP revealed all values WNL except for Total Protein/day at 720m, Free Kappa light chains at 3896.090m K:L ratio at 375.70, M spike % at 53.4%, and M spike at 39674mOn review of systems, pt reports stable energy levels, and denies abdominal pains,  leg swelling, new bone pains, and any other symptoms.   MEDICAL HISTORY:  Past Medical History:  Diagnosis Date  . Headache   . Low back pain   . SBO (small bowel obstruction) ([place]) 2010  . Smoldering multiple myeloma ([facility])   Previous history of hypothyroidism- not currently medications Anxiety Obesity .Body mass index is 37.94 kg/m. Vitamin D deficiency Smoldering multiple myeloma diagnosed in 2011 Lumbosacral radiculopathy Left hip pain  SURGICAL HISTORY: Past Surgical  History:  Procedure Laterality Date  . ABDOMINAL HYSTERECTOMY     complete  . BACK SURGERY     lower at [facility]  . colonscopy  2014  . GASTRIC BYPASS  yrs ago  . HERNIA REPAIR    . KNEE SURGERY  06/04/2009   both knees replaced   . TONSILLECTOMY  age 37  . TOTAL HIP ARTHROPLASTY Left 08/16/2017   Procedure: LEFT TOTAL HIP ARTHROPLASTY ANTERIOR APPROACH;  Surgeon: Dorna Leitz, MD;  Location: [facility];  Service: Orthopedics;  Laterality: Left;  Spinal surgery April 2017  SOCIAL HISTORY: Social History   Socioeconomic History  . Marital status: Married    Spouse name: Not on file  . Number of children: Not on file  . Years of education: Not on file  . Highest education level: Not on file  Occupational History  . Not on file  Social Needs  . Financial resource strain: Not on file  . Food insecurity    Worry: Not on file    Inability: Not on file  . Transportation needs    Medical: Not on file    Non-medical: Not on file  Tobacco Use  . Smoking status: Former Smoker    Packs/day: 0.50    Years: 10.00    Pack years: 5.00    Types: Cigarettes  . Smokeless tobacco: Never Used  . Tobacco comment: quit 1977  Substance and Sexual Activity  . Alcohol use: Yes    Comment: occ  . Drug use: No  . Sexual activity: Not on file  Lifestyle  . Physical activity    Days per week: Not on file    Minutes per session: Not on file  . Stress: Not on file  Relationships  . Social Herbalist on phone: Not on file    Gets together: Not on file    Attends religious service: Not on file    Active member of club or organization: Not on file    Attends meetings of clubs or organizations: Not on file    Relationship status: Not on file  . Intimate partner violence    Fear of current or ex partner: Not on file    Emotionally abused: Not on file    Physically abused: Not on file    Forced sexual activity: Not on file  Other Topics Concern  . Not on file  Social History  Narrative  . Not on file  Occasional alcohol use Former smoker and smoked 1 pack per week for about 9 years has since quit.  FAMILY HISTORY: No family history on file.  ALLERGIES:  is allergic to codeine.  MEDICATIONS:  Current Outpatient Medications  Medication Sig Dispense Refill  . B Complex-C (B-COMPLEX WITH VITAMIN C) tablet Take 1 tablet by mouth daily.    . ergocalciferol (VITAMIN D2) 1.25 MG (50000 UT) capsule Take 1 capsule (50,000 Units total) by mouth once a week. 12 capsule 1  . Potassium 99 MG TABS Take 99 mg by mouth  daily.    . vitamin B-12 (CYANOCOBALAMIN) 1000 MCG tablet Take 5,000 mcg by mouth daily.      No current facility-administered medications for this visit.     REVIEW OF SYSTEMS:    A 10+ POINT REVIEW OF SYSTEMS WAS OBTAINED including neurology, dermatology, psychiatry, cardiac, respiratory, lymph, extremities, GI, GU, Musculoskeletal, constitutional, breasts, reproductive, HEENT.  All pertinent positives are noted in the HPI.  All others are negative.   PHYSICAL EXAMINATION: ECOG PERFORMANCE STATUS: 1 - Symptomatic but completely ambulatory  Vitals:   04/15/19 1327  BP: 133/75  Pulse: 83  Resp: 18  Temp: 98.5 F (36.9 C)  SpO2: 100%   Filed Weights   04/15/19 1327  Weight: 214 lb 3.2 oz (97.2 kg)   .Body mass index is 37.94 kg/m.  GENERAL:alert, in no acute distress and comfortable SKIN: no acute rashes, no significant lesions EYES: conjunctiva are pink and non-injected, sclera anicteric OROPHARYNX: MMM, no exudates, no oropharyngeal erythema or ulceration NECK: supple, no JVD LYMPH:  no palpable lymphadenopathy in the cervical, axillary or inguinal regions LUNGS: clear to auscultation b/l with normal respiratory effort HEART: regular rate & rhythm ABDOMEN:  normoactive bowel sounds , non tender, not distended. No palpable hepatosplenomegaly.  Extremity: no pedal edema PSYCH: alert & oriented x 3 with fluent speech NEURO: no focal  motor/sensory deficits   LABORATORY DATA:  I have reviewed the data as listed  . CBC Latest Ref Rng & Units 04/15/2019 03/11/2019 02/23/2019  WBC 4.0 - 10.5 K/uL 2.5(L) 2.5(L) 2.5(L)  Hemoglobin 12.0 - 15.0 g/dL 9.8(L) 10.3(L) 10.1(L)  Hematocrit 36.0 - 46.0 % 30.3(L) 32.3(L) 31.9(L)  Platelets 150 - 400 K/uL 121(L) 124(L) 115(L)  ANC 1300 . CBC    Component Value Date/Time   WBC 2.5 (L) 04/15/2019 1246   RBC 3.13 (L) 04/15/2019 1246   HGB 9.8 (L) 04/15/2019 1246   HGB 10.5 (L) 12/01/2018 1256   HGB 11.3 (L) 09/30/2017 1145   HCT 30.3 (L) 04/15/2019 1246   HCT 35.1 09/30/2017 1145   PLT 121 (L) 04/15/2019 1246   PLT 123 (L) 12/01/2018 1256   PLT 172 09/30/2017 1145   MCV 96.8 04/15/2019 1246   MCV 97.5 09/30/2017 1145   MCH 31.3 04/15/2019 1246   MCHC 32.3 04/15/2019 1246   RDW 14.2 04/15/2019 1246   RDW 14.0 09/30/2017 1145   LYMPHSABS 1.5 04/15/2019 1246   LYMPHSABS 1.3 09/30/2017 1145   MONOABS 0.2 04/15/2019 1246   MONOABS 0.2 09/30/2017 1145   EOSABS 0.0 04/15/2019 1246   EOSABS 0.0 09/30/2017 1145   EOSABS 0.1 06/03/2014 1003   BASOSABS 0.0 04/15/2019 1246   BASOSABS 0.0 09/30/2017 1145     . CMP Latest Ref Rng & Units 04/15/2019 02/23/2019 11/24/2018  Glucose 70 - 99 mg/dL 88 81 88  BUN 8 - 23 mg/dL _0 Creatinine 0.44 - 1.00 mg/dL 0.94 0.84 0.95  Sodium 135 - 145 mmol/L 140 140 141  Potassium 3.5 - 5.1 mmol/L 4.0 4.3 4.0  Chloride 98 - 111 mmol/L 109 107 107  CO2 22 - 32 mmol/L _1 Calcium 8.9 - 10.3 mg/dL 8.8(L) 9.4 9.5  Total Protein 6.5 - 8.1 g/dL 7.0 7.0 6.8  Total Bilirubin 0.3 - 1.2 mg/dL 0.6 0.8 0.7  Alkaline Phos 38 - 126 U/L 99 92 104  AST 15 - 41 U/L _2 ALT 0 - 44 U/L 29 38 30   Component  Latest Ref Rng & Units 11/08/2016  LDH     125 - 245 U/L 220  Beta 2     0.6 - 2.4 mg/L 2.5 (H)   Component     Latest Ref Rng & Units 07/30/2017  Ferritin     9 - 269 ng/ml 81  Vitamin B12     232 - 1,245 pg/mL 594    03/11/19  BM Bx:   03/11/19 Cytogenetics:       RADIOGRAPHIC STUDIES: I have personally reviewed the radiological images as listed and agreed with the findings in the report. Nm Pet Image Restage (ps) Whole Body  Result Date: 04/01/2019 CLINICAL DATA:  Subsequent treatment strategy for multiple myeloma. EXAM: NUCLEAR MEDICINE PET WHOLE BODY TECHNIQUE: 10.7 mCi F-18 FDG was injected intravenously. Full-ring PET imaging was performed from the skull base to thigh after the radiotracer. CT data was obtained and used for attenuation correction and anatomic localization. Fasting blood glucose: 88 mg/dl COMPARISON:  PET-CT 03/12/2017 FINDINGS: Mediastinal blood pool activity: SUV max 3.4 HEAD/NECK: No hypermetabolic activity in the scalp. No hypermetabolic cervical lymph nodes. Incidental CT findings: none CHEST: No hypermetabolic mediastinal or hilar nodes. No suspicious pulmonary nodules on the CT scan. Incidental CT findings: none ABDOMEN/PELVIS: No abnormal hypermetabolic activity within the liver, pancreas, adrenal glands, or spleen. No hypermetabolic lymph nodes in the abdomen or pelvis. Incidental CT findings: Choose one gastric bypass anatomy. Post hysterectomy. SKELETON: No focal hypermetabolic activity to suggest skeletal metastasis. Incidental CT findings: none EXTREMITIES: No abnormal hypermetabolic activity in the lower extremities. Incidental CT findings: LEFT hip prosthetic and bilateral knee prosthetics. IMPRESSION: 1. No FDG evidence of active multiple myeloma within the skeleton. 2. No evidence of lytic lesions within the skeleton or soft tissue plasmacytoma. Electronically Signed   By: Suzy Bouchard M.D.   On: 04/01/2019 15:51     Bone survey 11/08/2016 IMPRESSION: 1. Small lytic lesion in the right scapula. 2. Possible small lytic lesion in the left scapula. 3. Postoperative changes. 4. Significant degenerative changes in both hips, left greater than right. 5. No evidence for acute  fracture   Electronically Signed   By: Nolon Nations M.D.   On: 11/08/2016 16:19   ASSESSMENT & PLAN:   68 y.o. very pleasant lady with history of   1) Multiple myeloma (concern for small lytic lesions in left and right scapulae) - (appears light chain producing) and Progressive anemia This was apparently diagnosed in 2011 by her hematologist in [place]. No renal failure/hypercalcemia. Bone survey with concern for possible small lytic lesions in B/L scapulae. PET/CT scan on 03/12/2017 showed no concerning bone lesions. Initial Bm Bx with 17% kappa restricted plasma cells consistent with plasma cell neoplasm. No treatment so far.  03/11/19 BM Bx revealed involvement by 50% plasma cells; genetics are pending, previous genetics from April 2018 BM Bx were standard risk.  03/16/19 MRI Lumbar reveals several explanations for her lower back pain including L2-L3 stenosis, but reassuringly, there was not evidence of involvement by multiple myeloma  2) Stable Kappa Lambda SFLC with increased K/L ratio - gradual increase  3) Chronic leukopenia - ? Related to SMM vs other nutritional deficiencies (h/o gastric bypass surgery put her at risk for nutritional deficiencies) B12 levels WNL Ferritin adequate ?additional factor -recent NSAID use. ?element of benign ethnic neutropenia. Has not had any issues with frequent infection.  ANC post-operative normalized today at 1700  4) Neuropathy/radiculopathy -Continue follow up with orthopedics  PLAN:  -Discussed pt  labwork today, 04/15/19; HGB decreased to 9.8. WBC stable at 2.5k and PLT stable at 121k. Chemistries are stable. -04/15/19 SFLC are pending. Last available 02/23/19 SFLC revealed Kappa light chains at 577.2 with K:L ratio of 46.18 -04/03/19 24hr UPEP revealed 3.89g of free kappa light chains  -Discussed the 04/01/19 PET/CT which revealed "No FDG evidence of active multiple myeloma within the skeleton. 2. No evidence of lytic lesions  within the skeleton or soft tissue Plasmacytoma." -Discussed the CRAB criteria again with the pt: no hypercalcemia, no abnormal renal function, worsening anemia, and no bone tumors. -Discussed the 03/11/19 Cytogenetics which did reveal a translocation 14;16 which is considered a high risk mutation -Discussed that I recommend beginning treatment of KRD. Weekly Dexamethasone, six doses of Carfilzomib every 28 days, and then considering adding Revlimid with C2 if initial tolerance is displayed. Revlimid for 3 weeks on and 1 week off. -Discussed the risk of allergic reactions, blood clots, neuropathy and shingles outbreaks -Will begin 41m aspirin daily and Acyclovir BID -Would repeat BM Bx in 6-8 cycles and consider maintenance treatment vs autologous BM transplant -Will set the pt up for chemotherapy counseling -Will refer the pt to IR for port placement -Will order Bone Density study for consideration of bone strengthening medications -Will aim to begin C1 KRD in the next 1-2 weeks -Pt is already established with orthopedics -Continue 50k Ergocalciferol weekly -Continue Iron Polysaccharide, Vitamin B complex and Vitamin B12 -Will see the pt back in 12 days  -Chemo-counseling for Carfilzomib/Revlimid/Dexamethasone in 3-4 days -Schedule to start KRd (Carfilzomib/Revlimid/Dexamethasone) treatment in 12 days with labs and MD visit -IR for port a cath placement in 3-5 days -Bone density testing in 2 weeks -Chemo-counseling for Carfilzomib/Revlimid/Dexamethasone in 3-4 days -Schedule to start KRd (Carfilzomib/Revlimid/Dexamethasone) treatment in 12 days with labs and MD visit   I discussed the assessment and treatment plan with the patient. The patient was provided an opportunity to ask questions and all were answered. The patient agreed with the plan and demonstrated an understanding of the instructions.   The patient was advised to call back or seek an in-person evaluation if the symptoms worsen  or if the condition fails to improve as anticipated.  The total time spent in the appt was 40 minutes and more than 50% was on counseling and direct patient cares.    GSullivan LoneMD MClarkAAHIVMS SWillow Crest HospitalCAdventhealth Gordon HospitalHematology/Oncology Physician CCord[facility] (Office):       [phone](Work cell):  [phone](Fax):           [phone] I, SBaldwin Jamaica am acting as a scribe for Dr. GSullivan Lone   .I have reviewed the above documentation for accuracy and completeness, and I agree with the above. .Brunetta GeneraMD

## 2019-04-15 ENCOUNTER — Inpatient Hospital Stay (HOSPITAL_BASED_OUTPATIENT_CLINIC_OR_DEPARTMENT_OTHER): Payer: Medicare Other | Admitting: Hematology

## 2019-04-15 ENCOUNTER — Inpatient Hospital Stay: Payer: Medicare Other | Attending: Hematology

## 2019-04-15 ENCOUNTER — Telehealth: Payer: Self-pay | Admitting: Hematology

## 2019-04-15 ENCOUNTER — Other Ambulatory Visit: Payer: Self-pay

## 2019-04-15 VITALS — BP 133/75 | HR 83 | Temp 98.5°F | Resp 18 | Ht 63.0 in | Wt 214.2 lb

## 2019-04-15 DIAGNOSIS — M8580 Other specified disorders of bone density and structure, unspecified site: Secondary | ICD-10-CM

## 2019-04-15 DIAGNOSIS — G629 Polyneuropathy, unspecified: Secondary | ICD-10-CM | POA: Diagnosis not present

## 2019-04-15 DIAGNOSIS — E559 Vitamin D deficiency, unspecified: Secondary | ICD-10-CM

## 2019-04-15 DIAGNOSIS — Z7189 Other specified counseling: Secondary | ICD-10-CM

## 2019-04-15 DIAGNOSIS — M48061 Spinal stenosis, lumbar region without neurogenic claudication: Secondary | ICD-10-CM | POA: Insufficient documentation

## 2019-04-15 DIAGNOSIS — E039 Hypothyroidism, unspecified: Secondary | ICD-10-CM | POA: Insufficient documentation

## 2019-04-15 DIAGNOSIS — D649 Anemia, unspecified: Secondary | ICD-10-CM

## 2019-04-15 DIAGNOSIS — C9000 Multiple myeloma not having achieved remission: Secondary | ICD-10-CM | POA: Diagnosis present

## 2019-04-15 DIAGNOSIS — Z5112 Encounter for antineoplastic immunotherapy: Secondary | ICD-10-CM | POA: Insufficient documentation

## 2019-04-15 DIAGNOSIS — M818 Other osteoporosis without current pathological fracture: Secondary | ICD-10-CM

## 2019-04-15 LAB — CBC WITH DIFFERENTIAL/PLATELET
Abs Immature Granulocytes: 0 10*3/uL (ref 0.00–0.07)
Basophils Absolute: 0 10*3/uL (ref 0.0–0.1)
Basophils Relative: 0 %
Eosinophils Absolute: 0 10*3/uL (ref 0.0–0.5)
Eosinophils Relative: 0 %
HCT: 30.3 % — ABNORMAL LOW (ref 36.0–46.0)
Hemoglobin: 9.8 g/dL — ABNORMAL LOW (ref 12.0–15.0)
Immature Granulocytes: 0 %
Lymphocytes Relative: 59 %
Lymphs Abs: 1.5 10*3/uL (ref 0.7–4.0)
MCH: 31.3 pg (ref 26.0–34.0)
MCHC: 32.3 g/dL (ref 30.0–36.0)
MCV: 96.8 fL (ref 80.0–100.0)
Monocytes Absolute: 0.2 10*3/uL (ref 0.1–1.0)
Monocytes Relative: 7 %
Neutro Abs: 0.9 10*3/uL — ABNORMAL LOW (ref 1.7–7.7)
Neutrophils Relative %: 34 %
Platelets: 121 10*3/uL — ABNORMAL LOW (ref 150–400)
RBC: 3.13 MIL/uL — ABNORMAL LOW (ref 3.87–5.11)
RDW: 14.2 % (ref 11.5–15.5)
WBC: 2.5 10*3/uL — ABNORMAL LOW (ref 4.0–10.5)
nRBC: 0.8 % — ABNORMAL HIGH (ref 0.0–0.2)

## 2019-04-15 LAB — CMP (CANCER CENTER ONLY)
ALT: 29 U/L (ref 0–44)
AST: 26 U/L (ref 15–41)
Albumin: 4 g/dL (ref 3.5–5.0)
Alkaline Phosphatase: 99 U/L (ref 38–126)
Anion gap: 7 (ref 5–15)
BUN: 11 mg/dL (ref 8–23)
CO2: 24 mmol/L (ref 22–32)
Calcium: 8.8 mg/dL — ABNORMAL LOW (ref 8.9–10.3)
Chloride: 109 mmol/L (ref 98–111)
Creatinine: 0.94 mg/dL (ref 0.44–1.00)
GFR, Est AFR Am: >60 mL/min (ref >60)
GFR, Estimated: >60 mL/min (ref >60)
Glucose, Bld: 88 mg/dL (ref 70–99)
Potassium: 4 mmol/L (ref 3.5–5.1)
Sodium: 140 mmol/L (ref 135–145)
Total Bilirubin: 0.6 mg/dL (ref 0.3–1.2)
Total Protein: 7 g/dL (ref 6.5–8.1)

## 2019-04-15 NOTE — Telephone Encounter (Signed)
Scheduled appt per 7/1 los. Patient aware of appt date and time.

## 2019-04-16 ENCOUNTER — Other Ambulatory Visit: Payer: Self-pay | Admitting: Student

## 2019-04-16 DIAGNOSIS — C9000 Multiple myeloma not having achieved remission: Secondary | ICD-10-CM | POA: Insufficient documentation

## 2019-04-16 DIAGNOSIS — Z7189 Other specified counseling: Secondary | ICD-10-CM | POA: Insufficient documentation

## 2019-04-16 LAB — NOVEL CORONAVIRUS, NAA: SARS-CoV-2, NAA: NOT DETECTED

## 2019-04-16 LAB — KAPPA/LAMBDA LIGHT CHAINS
Kappa free light chain: 501.3 mg/L — ABNORMAL HIGH (ref 3.3–19.4)
Kappa, lambda light chain ratio: 42.13 — ABNORMAL HIGH (ref 0.26–1.65)
Lambda free light chains: 11.9 mg/L (ref 5.7–26.3)

## 2019-04-16 NOTE — Progress Notes (Signed)
START ON PATHWAY REGIMEN - Multiple Myeloma and Other Plasma Cell Dyscrasias     A cycle is every 28 days:     Carfilzomib      Carfilzomib      Carfilzomib      Dexamethasone      Dexamethasone      Lenalidomide   **Always confirm dose/schedule in your pharmacy ordering system**  Patient Characteristics: Newly Diagnosed, Transplant Eligible, High Risk R-ISS Staging: III Disease Classification: Newly Diagnosed Is Patient Eligible for Transplant<= Transplant Eligible Risk Status: High Risk Intent of Therapy: Non-Curative / Palliative Intent, Discussed with Patient

## 2019-04-20 ENCOUNTER — Telehealth: Payer: Self-pay | Admitting: *Deleted

## 2019-04-20 ENCOUNTER — Ambulatory Visit (HOSPITAL_COMMUNITY)
Admission: RE | Admit: 2019-04-20 | Discharge: 2019-04-20 | Disposition: A | Discharge status: Home / Self Care | Payer: Medicare Other | Source: Ambulatory Visit | Attending: Hematology | Admitting: Hematology

## 2019-04-20 ENCOUNTER — Encounter (HOSPITAL_COMMUNITY): Payer: Self-pay

## 2019-04-20 ENCOUNTER — Ambulatory Visit (HOSPITAL_COMMUNITY)
Admission: RE | Admit: 2019-04-20 | Discharge: 2019-04-20 | Disposition: A | Discharge status: Home / Self Care | Payer: Medicare Other | Source: Ambulatory Visit

## 2019-04-20 ENCOUNTER — Inpatient Hospital Stay: Payer: Medicare Other

## 2019-04-20 ENCOUNTER — Other Ambulatory Visit: Payer: Self-pay

## 2019-04-20 DIAGNOSIS — Z452 Encounter for adjustment and management of vascular access device: Secondary | ICD-10-CM | POA: Diagnosis not present

## 2019-04-20 DIAGNOSIS — Z90710 Acquired absence of both cervix and uterus: Secondary | ICD-10-CM | POA: Diagnosis not present

## 2019-04-20 DIAGNOSIS — Z885 Allergy status to narcotic agent status: Secondary | ICD-10-CM | POA: Diagnosis not present

## 2019-04-20 DIAGNOSIS — Z9884 Bariatric surgery status: Secondary | ICD-10-CM | POA: Insufficient documentation

## 2019-04-20 DIAGNOSIS — Z5111 Encounter for antineoplastic chemotherapy: Secondary | ICD-10-CM | POA: Diagnosis not present

## 2019-04-20 DIAGNOSIS — C9000 Multiple myeloma not having achieved remission: Secondary | ICD-10-CM | POA: Insufficient documentation

## 2019-04-20 DIAGNOSIS — Z96642 Presence of left artificial hip joint: Secondary | ICD-10-CM | POA: Insufficient documentation

## 2019-04-20 HISTORY — PX: IR IMAGING GUIDED PORT INSERTION: IMG5740

## 2019-04-20 LAB — PROTIME-INR
INR: 0.9 (ref 0.8–1.2)
Prothrombin Time: 12.1 seconds (ref 11.4–15.2)

## 2019-04-20 LAB — CBC
HCT: 34.5 % — ABNORMAL LOW (ref 36.0–46.0)
Hemoglobin: 11.1 g/dL — ABNORMAL LOW (ref 12.0–15.0)
MCH: 31.8 pg (ref 26.0–34.0)
MCHC: 32.2 g/dL (ref 30.0–36.0)
MCV: 98.9 fL (ref 80.0–100.0)
Platelets: 122 10*3/uL — ABNORMAL LOW (ref 150–400)
RBC: 3.49 MIL/uL — ABNORMAL LOW (ref 3.87–5.11)
RDW: 14.3 % (ref 11.5–15.5)
WBC: 2.9 10*3/uL — ABNORMAL LOW (ref 4.0–10.5)
nRBC: 0.7 % — ABNORMAL HIGH (ref 0.0–0.2)

## 2019-04-20 MED ORDER — CEFAZOLIN SODIUM-DEXTROSE 2-4 GM/100ML-% IV SOLN
INTRAVENOUS | Status: AC
  Administered 2019-04-20: 10:00:00 2 g via INTRAVENOUS
  Filled 2019-04-20: qty 100

## 2019-04-20 MED ORDER — HEPARIN SOD (PORK) LOCK FLUSH 100 UNIT/ML IV SOLN
INTRAVENOUS | Status: AC
  Filled 2019-04-20: qty 5

## 2019-04-20 MED ORDER — PROCHLORPERAZINE MALEATE 10 MG PO TABS: 10.0000 mg | ORAL_TABLET | Freq: Four times a day (QID) | ORAL | 0 refills | Status: DC | PRN

## 2019-04-20 MED ORDER — FENTANYL CITRATE (PF) 100 MCG/2ML IJ SOLN
INTRAMUSCULAR | Status: AC | PRN
  Administered 2019-04-20: 50 ug via INTRAVENOUS
  Administered 2019-04-20: 25 ug via INTRAVENOUS

## 2019-04-20 MED ORDER — SODIUM CHLORIDE 0.9 % IV SOLN
INTRAVENOUS | Status: DC
  Administered 2019-04-20: 09:00:00 via INTRAVENOUS

## 2019-04-20 MED ORDER — MIDAZOLAM HCL 2 MG/2ML IJ SOLN
INTRAMUSCULAR | Status: AC | PRN
  Administered 2019-04-20: 1 mg via INTRAVENOUS

## 2019-04-20 MED ORDER — FENTANYL CITRATE (PF) 100 MCG/2ML IJ SOLN
INTRAMUSCULAR | Status: AC
  Filled 2019-04-20: qty 2

## 2019-04-20 MED ORDER — LIDOCAINE-EPINEPHRINE (PF) 2 %-1:200000 IJ SOLN
INTRAMUSCULAR | Status: AC | PRN
  Administered 2019-04-20: 10 mL via INTRADERMAL

## 2019-04-20 MED ORDER — MIDAZOLAM HCL 2 MG/2ML IJ SOLN
INTRAMUSCULAR | Status: AC
  Filled 2019-04-20: qty 2

## 2019-04-20 MED ORDER — LIDOCAINE-PRILOCAINE 2.5-2.5 % EX CREA: 1.0000 "application " | TOPICAL_CREAM | CUTANEOUS | 0 refills | Status: DC | PRN

## 2019-04-20 MED ORDER — CEFAZOLIN SODIUM-DEXTROSE 2-4 GM/100ML-% IV SOLN
2.0000 g | Freq: Once | INTRAVENOUS | Status: AC
  Administered 2019-04-20: 10:00:00 2 g via INTRAVENOUS

## 2019-04-20 MED ORDER — LIDOCAINE-EPINEPHRINE 1 %-1:100000 IJ SOLN
INTRAMUSCULAR | Status: AC
  Administered 2019-04-20: 10:00:00 10 mL
  Filled 2019-04-20: qty 1

## 2019-04-20 NOTE — Discharge Instructions (Signed)
Do not use EMLA cream on your new port until the cancer center nurses tell you to. Emla cream will dissolve the skin glue holding the skin over your port together. Use ice in a zip lock bag for 3-4 minutes prior to nurses accessing your new port.     Implanted [facility] Guide An implanted port is a device that is placed under the skin. It is usually placed in the chest. The device can be used to give IV medicine, to take blood, or for dialysis. You may have an implanted port if:  You need IV medicine that would be irritating to the small veins in your hands or arms.  You need IV medicines, such as antibiotics, for a long period of time.  You need IV nutrition for a long period of time.  You need dialysis. Having a port means that your health care provider will not need to use the veins in your arms for these procedures. You may have fewer limitations when using a port than you would if you used other types of long-term IVs, and you will likely be able to return to normal activities after your incision heals. An implanted port has two main parts:  Reservoir. The reservoir is the part where a needle is inserted to give medicines or draw blood. The reservoir is round. After it is placed, it appears as a small, raised area under your skin.  Catheter. The catheter is a thin, flexible tube that connects the reservoir to a vein. Medicine that is inserted into the reservoir goes into the catheter and then into the vein. How is my port accessed? To access your port:  A numbing cream may be placed on the skin over the port site.  Your health care provider will put on a mask and sterile gloves.  The skin over your port will be cleaned carefully with a germ-killing soap and allowed to dry.  Your health care provider will gently pinch the port and insert a needle into it.  Your health care provider will check for a blood return to make sure the port is in the vein and is not clogged.  If your  port needs to remain accessed to get medicine continuously (constant infusion), your health care provider will place a clear bandage (dressing) over the needle site. The dressing and needle will need to be changed every week, or as told by your health care provider. What is flushing? Flushing helps keep the port from getting clogged. Follow instructions from your health care provider about how and when to flush the port. Ports are usually flushed with saline solution or a medicine called heparin. The need for flushing will depend on how the port is used:  If the port is only used from time to time to give medicines or draw blood, the port may need to be flushed: ? Before and after medicines have been given. ? Before and after blood has been drawn. ? As part of routine maintenance. Flushing may be recommended every 4-6 weeks.  If a constant infusion is running, the port may not need to be flushed.  Throw away any syringes in a disposal container that is meant for sharp items (sharps container). You can buy a sharps container from a pharmacy, or you can make one by using an empty hard plastic bottle with a cover. How long will my port stay implanted? The port can stay in for as long as your health care provider thinks it is  needed. When it is time for the port to come out, a surgery will be done to remove it. The surgery will be similar to the procedure that was done to put the port in. Follow these instructions at home:   Flush your port as told by your health care provider.  If you need an infusion over several days, follow instructions from your health care provider about how to take care of your port site. Make sure you: ? Wash your hands with soap and water before you change your dressing. If soap and water are not available, use alcohol-based hand sanitizer. ? Change your dressing as told by your health care provider. ? Place any used dressings or infusion bags into a plastic bag. Throw  that bag in the trash. ? Keep the dressing that covers the needle clean and dry. Do not get it wet. ? Do not use scissors or sharp objects near the tube. ? Keep the tube clamped, unless it is being used.  Check your port site every day for signs of infection. Check for: ? Redness, swelling, or pain. ? Fluid or blood. ? Pus or a bad smell.  Protect the skin around the port site. ? Avoid wearing bra straps that rub or irritate the site. ? Protect the skin around your port from seat belts. Place a soft pad over your chest if needed.  Bathe or shower as told by your health care provider. The site may get wet as long as you are not actively receiving an infusion.  Return to your normal activities as told by your health care provider. Ask your health care provider what activities are safe for you.  Carry a medical alert card or wear a medical alert bracelet at all times. This will let health care providers know that you have an implanted port in case of an emergency. Get help right away if:  You have redness, swelling, or pain at the port site.  You have fluid or blood coming from your port site.  You have pus or a bad smell coming from the port site.  You have a fever. Summary  Implanted ports are usually placed in the chest for long-term IV access.  Follow instructions from your health care provider about flushing the port and changing bandages (dressings).  Take care of the area around your port by avoiding clothing that puts pressure on the area, and by watching for signs of infection.  Protect the skin around your port from seat belts. Place a soft pad over your chest if needed.  Get help right away if you have a fever or you have redness, swelling, pain, drainage, or a bad smell at the port site. This information is not intended to replace advice given to you by your health care provider. Make sure you discuss any questions you have with your health care provider. Document  Released: 10/01/2005 Document Revised: 01/23/2019 Document Reviewed: 11/03/2016 Elsevier Patient Education  [place].     Moderate Conscious Sedation, Adult, Care After These instructions provide you with information about caring for yourself after your procedure. Your health care provider may also give you more specific instructions. Your treatment has been planned according to current medical practices, but problems sometimes occur. Call your health care provider if you have any problems or questions after your procedure. What can I expect after the procedure? After your procedure, it is common:  To feel sleepy for several hours.  To feel clumsy and have poor  balance for several hours.  To have poor judgment for several hours.  To vomit if you eat too soon. Follow these instructions at home: For at least 24 hours after the procedure:   Do not: ? Participate in activities where you could fall or become injured. ? Drive. ? Use heavy machinery. ? Drink alcohol. ? Take sleeping pills or medicines that cause drowsiness. ? Make important decisions or sign legal documents. ? Take care of children on your own.  Rest. Eating and drinking  Follow the diet recommended by your health care provider.  If you vomit: ? Drink water, juice, or soup when you can drink without vomiting. ? Make sure you have little or no nausea before eating solid foods. General instructions  Have a responsible adult stay with you until you are awake and alert.  Take over-the-counter and prescription medicines only as told by your health care provider.  If you smoke, do not smoke without supervision.  Keep all follow-up visits as told by your health care provider. This is important. Contact a health care provider if:  You keep feeling nauseous or you keep vomiting.  You feel light-headed.  You develop a rash.  You have a fever. Get help right away if:  You have trouble  breathing. This information is not intended to replace advice given to you by your health care provider. Make sure you discuss any questions you have with your health care provider. Document Released: 07/22/2013 Document Revised: 09/13/2017 Document Reviewed: 01/21/2016 Elsevier Patient Education  2020 [employer].

## 2019-04-20 NOTE — Procedures (Signed)
Myeloma  S/p RT IJ PORT  Tip svcra No comp Stable ebl min Full report in pacs Ready for use

## 2019-04-20 NOTE — H&P (Signed)
Referring Physician(s): Brunetta Genera  Supervising Physician: Daryll Brod  Patient Status:  [facility] OP  Chief Complaint: "I'm getting a port a cath"   Subjective: Patient familiar to IR service from prior bone marrow biopsies in 2018 and on 03/11/2019.  She has a history of newly diagnosed multiple myeloma and presents today for Port-A-Cath placement for chemotherapy.  She currently denies fever, headache, chest pain, dyspnea, cough, abdominal pain, nausea, vomiting or bleeding.  She does have some low back pain.    Past Medical History:  Diagnosis Date   Headache    Low back pain    SBO (small bowel obstruction) ([place]) 2010   Smoldering multiple myeloma ([place])    Past Surgical History:  Procedure Laterality Date   ABDOMINAL HYSTERECTOMY     complete   BACK SURGERY     lower at [facility]   colonscopy  2014   GASTRIC BYPASS  yrs ago   [place]  06/04/2009   both knees replaced    TONSILLECTOMY  age 61   TOTAL HIP ARTHROPLASTY Left 08/16/2017   Procedure: LEFT TOTAL HIP ARTHROPLASTY ANTERIOR APPROACH;  Surgeon: Dorna Leitz, MD;  Location: [facility] ORS;  Service: Orthopedics;  Laterality: Left;       Allergies: Codeine  Medications: Prior to Admission medications   Medication Sig Start Date End Date Taking? Authorizing Provider  B Complex-C (B-COMPLEX WITH VITAMIN C) tablet Take 1 tablet by mouth daily.   Yes [provider]  ECHINACEA-ZINC-VITAMIN C PO Take 1 tablet by mouth daily.   Yes [provider]  ergocalciferol (VITAMIN D2) 1.25 MG (50000 UT) capsule Take 1 capsule (50,000 Units total) by mouth once a week. 12/01/18  Yes Brunetta Genera, MD  MULTIPLE VITAMIN PO Take 1 tablet by mouth daily.   Yes [provider]  Potassium 99 MG TABS Take 99 mg by mouth daily.   Yes [provider]  vitamin B-12 (CYANOCOBALAMIN) 1000 MCG tablet Take 5,000 mcg by mouth daily.    Yes [provider]     Vital Signs: BP (!) 137/91 (BP Location: Left Arm)    Pulse 63    Temp 98.3 F (36.8 C) (Oral)    Resp 18    SpO2 100%   Physical Exam awake, alert.  Chest clear to auscultation bilaterally.  Heart with regular rate and rhythm.  Abdomen soft, positive bowel sounds, nontender.  No lower extremity edema.  Imaging: No results found.  Labs:  CBC: Recent Labs    12/01/18 1256 02/23/19 1113 03/11/19 0938 04/15/19 1246  WBC 3.1* 2.5* 2.5* 2.5*  HGB 10.5* 10.1* 10.3* 9.8*  HCT 32.3* 31.9* 32.3* 30.3*  PLT 123* 115* 124* 121*    COAGS: Recent Labs    03/11/19 0938  INR 0.9    BMP: Recent Labs    09/15/18 1143 11/24/18 1351 02/23/19 1113 04/15/19 1246  NA 143 141 140 140  K 3.6 4.0 4.3 4.0  CL 113* 107 107 109  CO2 24 24 24 24   GLUCOSE 93 88 81 88  BUN 12 14 13 11   CALCIUM 8.9 9.5 9.4 8.8*  CREATININE 0.94 0.95 0.84 0.94  GFRNONAA >60 >60 >60 >60  GFRAA >60 >60 >60 >60    LIVER FUNCTION TESTS: Recent Labs    09/15/18 1143 11/24/18 1351 02/23/19 1113 04/15/19 1246  BILITOT 0.8 0.7 0.8 0.6  AST 25 26 29 26   ALT 24 30 38 29  ALKPHOS 81 104 92 99  PROT 6.7 6.8 7.0 7.0  ALBUMIN 3.8 4.2 4.1 4.0    Assessment and Plan: Patient with history of newly diagnosed multiple myeloma; presents today for Port-A-Cath placement for chemotherapy.Risks and benefits of image guided port-a-catheter placement was discussed with the patient including, but not limited to bleeding, infection, pneumothorax, or fibrin sheath development and need for additional procedures.  All of the patient's questions were answered, patient is agreeable to proceed. Consent signed and in chart.     Electronically Signed: D. Rowe Robert, PA-C 04/20/2019, 8:36 AM   I spent a total of 20 minutes at the the patient's bedside AND on the patient's hospital floor or unit, greater than 50% of which was counseling/coordinating care for Port-A-Cath placement

## 2019-04-27 ENCOUNTER — Other Ambulatory Visit: Payer: Self-pay

## 2019-04-27 ENCOUNTER — Inpatient Hospital Stay (HOSPITAL_BASED_OUTPATIENT_CLINIC_OR_DEPARTMENT_OTHER): Payer: Medicare Other | Admitting: Hematology

## 2019-04-27 ENCOUNTER — Inpatient Hospital Stay: Payer: Medicare Other

## 2019-04-27 ENCOUNTER — Telehealth: Payer: Self-pay | Admitting: Hematology

## 2019-04-27 ENCOUNTER — Encounter: Payer: Self-pay | Admitting: Hematology

## 2019-04-27 VITALS — BP 142/58 | HR 74 | Temp 98.5°F | Resp 18 | Ht 63.0 in | Wt 210.6 lb

## 2019-04-27 DIAGNOSIS — C9000 Multiple myeloma not having achieved remission: Secondary | ICD-10-CM

## 2019-04-27 DIAGNOSIS — Z95828 Presence of other vascular implants and grafts: Secondary | ICD-10-CM

## 2019-04-27 DIAGNOSIS — Z5112 Encounter for antineoplastic immunotherapy: Secondary | ICD-10-CM | POA: Diagnosis not present

## 2019-04-27 DIAGNOSIS — E039 Hypothyroidism, unspecified: Secondary | ICD-10-CM | POA: Diagnosis not present

## 2019-04-27 DIAGNOSIS — E559 Vitamin D deficiency, unspecified: Secondary | ICD-10-CM | POA: Diagnosis not present

## 2019-04-27 DIAGNOSIS — M48061 Spinal stenosis, lumbar region without neurogenic claudication: Secondary | ICD-10-CM | POA: Diagnosis not present

## 2019-04-27 DIAGNOSIS — Z7189 Other specified counseling: Secondary | ICD-10-CM

## 2019-04-27 DIAGNOSIS — G629 Polyneuropathy, unspecified: Secondary | ICD-10-CM

## 2019-04-27 LAB — CBC WITH DIFFERENTIAL/PLATELET
Abs Immature Granulocytes: 0.01 10*3/uL (ref 0.00–0.07)
Basophils Absolute: 0 10*3/uL (ref 0.0–0.1)
Basophils Relative: 0 %
Eosinophils Absolute: 0 10*3/uL (ref 0.0–0.5)
Eosinophils Relative: 0 %
HCT: 30.7 % — ABNORMAL LOW (ref 36.0–46.0)
Hemoglobin: 9.9 g/dL — ABNORMAL LOW (ref 12.0–15.0)
Immature Granulocytes: 0 %
Lymphocytes Relative: 56 %
Lymphs Abs: 1.9 10*3/uL (ref 0.7–4.0)
MCH: 30.7 pg (ref 26.0–34.0)
MCHC: 32.2 g/dL (ref 30.0–36.0)
MCV: 95 fL (ref 80.0–100.0)
Monocytes Absolute: 0.2 10*3/uL (ref 0.1–1.0)
Monocytes Relative: 7 %
Neutro Abs: 1.2 10*3/uL — ABNORMAL LOW (ref 1.7–7.7)
Neutrophils Relative %: 37 %
Platelets: 111 10*3/uL — ABNORMAL LOW (ref 150–400)
RBC: 3.23 MIL/uL — ABNORMAL LOW (ref 3.87–5.11)
RDW: 13.6 % (ref 11.5–15.5)
WBC: 3.3 10*3/uL — ABNORMAL LOW (ref 4.0–10.5)
nRBC: 0.9 % — ABNORMAL HIGH (ref 0.0–0.2)

## 2019-04-27 LAB — CMP (CANCER CENTER ONLY)
ALT: 30 U/L (ref 0–44)
AST: 30 U/L (ref 15–41)
Albumin: 4.1 g/dL (ref 3.5–5.0)
Alkaline Phosphatase: 97 U/L (ref 38–126)
Anion gap: 9 (ref 5–15)
BUN: 16 mg/dL (ref 8–23)
CO2: 21 mmol/L — ABNORMAL LOW (ref 22–32)
Calcium: 9.2 mg/dL (ref 8.9–10.3)
Chloride: 109 mmol/L (ref 98–111)
Creatinine: 0.87 mg/dL (ref 0.44–1.00)
GFR, Est AFR Am: >60 mL/min (ref >60)
GFR, Estimated: >60 mL/min (ref >60)
Glucose, Bld: 85 mg/dL (ref 70–99)
Potassium: 4.2 mmol/L (ref 3.5–5.1)
Sodium: 139 mmol/L (ref 135–145)
Total Bilirubin: 0.6 mg/dL (ref 0.3–1.2)
Total Protein: 7.2 g/dL (ref 6.5–8.1)

## 2019-04-27 MED ORDER — SODIUM CHLORIDE 0.9 % IV SOLN
Freq: Once | INTRAVENOUS | Status: AC
  Administered 2019-04-27: 15:00:00 via INTRAVENOUS
  Filled 2019-04-27: qty 250

## 2019-04-27 MED ORDER — ACETAMINOPHEN 500 MG PO TABS
ORAL_TABLET | ORAL | Status: AC
  Filled 2019-04-27: qty 2

## 2019-04-27 MED ORDER — PROCHLORPERAZINE MALEATE 10 MG PO TABS
ORAL_TABLET | ORAL | Status: AC
  Filled 2019-04-27: qty 1

## 2019-04-27 MED ORDER — PROCHLORPERAZINE MALEATE 10 MG PO TABS
10.0000 mg | ORAL_TABLET | Freq: Once | ORAL | Status: AC
  Administered 2019-04-27: 14:00:00 10 mg via ORAL

## 2019-04-27 MED ORDER — SODIUM CHLORIDE 0.9% FLUSH
10.0000 mL | Freq: Once | INTRAVENOUS | Status: AC
  Administered 2019-04-27: 10 mL
  Filled 2019-04-27: qty 10

## 2019-04-27 MED ORDER — ACETAMINOPHEN 500 MG PO TABS
1000.0000 mg | ORAL_TABLET | Freq: Once | ORAL | Status: AC
  Administered 2019-04-27: 1000 mg via ORAL

## 2019-04-27 MED ORDER — ONDANSETRON HCL 8 MG PO TABS: 8.0000 mg | ORAL_TABLET | Freq: Two times a day (BID) | ORAL | 1 refills | Status: DC | PRN

## 2019-04-27 MED ORDER — SODIUM CHLORIDE 0.9% FLUSH
10.0000 mL | INTRAVENOUS | Status: DC | PRN
  Administered 2019-04-27: 10 mL
  Filled 2019-04-27: qty 10

## 2019-04-27 MED ORDER — DEXTROSE 5 % IV SOLN
19.0000 mg/m2 | Freq: Once | INTRAVENOUS | Status: AC
  Administered 2019-04-27: 40 mg via INTRAVENOUS
  Filled 2019-04-27: qty 15

## 2019-04-27 MED ORDER — LIDOCAINE-PRILOCAINE 2.5-2.5 % EX CREA: TOPICAL_CREAM | CUTANEOUS | 3 refills | Status: DC

## 2019-04-27 MED ORDER — ACYCLOVIR 400 MG PO TABS: 400.0000 mg | ORAL_TABLET | Freq: Two times a day (BID) | ORAL | 3 refills | Status: DC

## 2019-04-27 MED ORDER — HEPARIN SOD (PORK) LOCK FLUSH 100 UNIT/ML IV SOLN
500.0000 [IU] | Freq: Once | INTRAVENOUS | Status: AC | PRN
  Administered 2019-04-27: 500 [IU]
  Filled 2019-04-27: qty 5

## 2019-04-27 MED ORDER — SODIUM CHLORIDE 0.9 % IV SOLN
Freq: Once | INTRAVENOUS | Status: AC
  Administered 2019-04-27: 13:00:00 via INTRAVENOUS
  Filled 2019-04-27: qty 250

## 2019-04-27 MED ORDER — PROCHLORPERAZINE MALEATE 10 MG PO TABS: 10.0000 mg | ORAL_TABLET | Freq: Four times a day (QID) | ORAL | 1 refills | Status: DC | PRN

## 2019-04-27 MED ORDER — SODIUM CHLORIDE 0.9 % IV SOLN
20.0000 mg | Freq: Once | INTRAVENOUS | Status: AC
  Administered 2019-04-27: 20 mg via INTRAVENOUS
  Filled 2019-04-27: qty 20

## 2019-04-27 NOTE — Progress Notes (Signed)
Per Dr. Irene Limbo ok to treat with [place] 1.2.

## 2019-04-27 NOTE — Telephone Encounter (Signed)
Scheduled appts per 7/13 los.

## 2019-04-27 NOTE — Patient Instructions (Addendum)
[facility] Discharge Instructions for Patients Receiving Chemotherapy  Today you received the following chemotherapy agents Kyprolis (Carfilzomib)  To help prevent nausea and vomiting after your treatment, we encourage you to take your nausea medication as directed   If you develop nausea and vomiting that is not controlled by your nausea medication, call the clinic.   BELOW ARE SYMPTOMS THAT SHOULD BE REPORTED IMMEDIATELY:  *FEVER GREATER THAN 100.5 F  *CHILLS WITH OR WITHOUT FEVER  NAUSEA AND VOMITING THAT IS NOT CONTROLLED WITH YOUR NAUSEA MEDICATION  *UNUSUAL SHORTNESS OF BREATH  *UNUSUAL BRUISING OR BLEEDING  TENDERNESS IN MOUTH AND THROAT WITH OR WITHOUT PRESENCE OF ULCERS  *URINARY PROBLEMS  *BOWEL PROBLEMS  UNUSUAL RASH Items with * indicate a potential emergency and should be followed up as soon as possible.  Feel free to call the clinic should you have any questions or concerns. The clinic phone number is [phone].  Please show the [facility] at check-in to the Emergency Department and triage nurse.  Carfilzomib injection (Kyprolis) What is this medicine? CARFILZOMIB (kar FILZ oh mib) targets a specific protein within cancer cells and stops the cancer cells from growing. It is used to treat multiple myeloma. This medicine may be used for other purposes; ask your health care provider or pharmacist if you have questions. COMMON BRAND NAME(S): KYPROLIS What should I tell my health care provider before I take this medicine? They need to know if you have any of these conditions:  heart disease  history of blood clots  irregular heartbeat  kidney disease  liver disease  lung or breathing disease  an unusual or allergic reaction to carfilzomib, or other medicines, foods, dyes, or preservatives  pregnant or trying to get pregnant  breast-feeding How should I use this medicine? This medicine is for injection or infusion into a  vein. It is given by a health care professional in a hospital or clinic setting. Talk to your pediatrician regarding the use of this medicine in children. Special care may be needed. Overdosage: If you think you have taken too much of this medicine contact a poison control center or emergency room at once. NOTE: This medicine is only for you. Do not share this medicine with others. What if I miss a dose? It is important not to miss your dose. Call your doctor or health care professional if you are unable to keep an appointment. What may interact with this medicine? Interactions are not expected. Give your health care provider a list of all the medicines, herbs, non-prescription drugs, or dietary supplements you use. Also tell them if you smoke, drink alcohol, or use illegal drugs. Some items may interact with your medicine. This list may not describe all possible interactions. Give your health care provider a list of all the medicines, herbs, non-prescription drugs, or dietary supplements you use. Also tell them if you smoke, drink alcohol, or use illegal drugs. Some items may interact with your medicine. What should I watch for while using this medicine? Your condition will be monitored carefully while you are receiving this medicine. Report any side effects. Continue your course of treatment even though you feel ill unless your doctor tells you to stop. You may need blood work done while you are taking this medicine. Do not become pregnant while taking this medicine or for at least 6 months after stopping it. Women should inform their doctor if they wish to become pregnant or think they might be pregnant. There  is a potential for serious side effects to an unborn child. Men should not father a child while taking this medicine and for at least 3 months after stopping it. Talk to your health care professional or pharmacist for more information. Do not breast-feed an infant while taking this medicine or  for 2 weeks after the last dose. Check with your doctor or health care professional if you get an attack of severe diarrhea, nausea and vomiting, or if you sweat a lot. The loss of too much body fluid can make it dangerous for you to take this medicine. You may get dizzy. Do not drive, use machinery, or do anything that needs mental alertness until you know how this medicine affects you. Do not stand or sit up quickly, especially if you are an older patient. This reduces the risk of dizzy or fainting spells. What side effects may I notice from receiving this medicine? Side effects that you should report to your doctor or health care professional as soon as possible:  allergic reactions like skin rash, itching or hives, swelling of the face, lips, or tongue  confusion  dizziness  feeling faint or lightheaded  fever or chills  palpitations  seizures  signs and symptoms of bleeding such as bloody or black, tarry stools; red or dark-brown urine; spitting up blood or brown material that looks like coffee grounds; red spots on the skin; unusual bruising or bleeding including from the eye, gums, or nose  signs and symptoms of a blood clot such as breathing problems; changes in vision; chest pain; severe, sudden headache; pain, swelling, warmth in the leg; trouble speaking; sudden numbness or weakness of the face, arm or leg  signs and symptoms of kidney injury like trouble passing urine or change in the amount of urine  signs and symptoms of liver injury like dark yellow or brown urine; general ill feeling or flu-like symptoms; light-colored stools; loss of appetite; nausea; right upper belly pain; unusually weak or tired; yellowing of the eyes or skin Side effects that usually do not require medical attention (report to your doctor or health care professional if they continue or are bothersome):  back pain  cough  diarrhea  headache  muscle cramps  vomiting This list may not  describe all possible side effects. Call your doctor for medical advice about side effects. You may report side effects to FDA at 1-800-FDA-1088. Where should I keep my medicine? This drug is given in a hospital or clinic and will not be stored at home. NOTE: This sheet is a summary. It may not cover all possible information. If you have questions about this medicine, talk to your doctor, pharmacist, or health care provider.  2020 Elsevier/Gold Standard (2017-07-17 14:07:13)

## 2019-04-27 NOTE — Progress Notes (Signed)
Marland Kitchen    HEMATOLOGY/ONCOLOGY CLINIC NOTE  Date of Service: 04/27/19     PCP: Marzetta Board MD  CHIEF COMPLAINTS/PURPOSE OF CONSULTATION:  Continued f/u for smoldering myeloma   HISTORY OF PRESENTING ILLNESS:   Madison Parker is a wonderful 68 y.o. 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  Patient has a history of smoldering multiple myeloma which she reports she was diagnosed with in 2011 when she was evaluated by a hematologist in [place]. She reports that she presented with low blood counts (low WBC)   and after significant workup she had a bone marrow examination based on which she was told that she has smoldering multiple myeloma. Patient notes that she had a bone survey at that time which was negative. She was monitored there closely and then moved to [facility] in 2015 to stay with her son whose wife was being treated for breast cancer. Patient was following with Dr. Delight Hoh in [place]. She reports not having had any treatment for multiple myeloma.  Her last labs from about a year ago showed a SPEP with no OBSERVED M spike . Serum free light chains showed an elevation of kappa Free light chain 310 and lambda free light chain of 12.7 with an abnormal Kappa/ Lambda ratio of 24.38 ( up from 19 previously). Random urine showed an M protein complement of 44%.  She lives in [place] and requested transfer of care to [place]. She was offered follow-up but [place] in [place]  But prefers to  follow [place] in [place]. Patient reports her energy levels been stable. She has chronic back pain which has improved since the patient had her spinal surgery in April 2017 (L4-L5 interbody fusion). Patient notes she has had some chronic left hip pain which is somewhat more bothersome with the navy on the lateral aspect of her upper thighs that's painful. She also notes some right hip  pain. Over the last few weeks he notes some upper neck pain especially when bending her neck backwards and tingling numbness in her right upper extremity.  Has been working on losing some weight voluntarily.  No acute other new symptoms.   INTERVAL HISTORY:   Madison Parker returns today for f/u for her smoldering multiple myeloma. The patient's last visit with [place] was on 04/15/2019. The pt reports that she is doing well overall.  The pt reports that she broke out due to the dressing around the port. She has told her family about her diagnosis and treatment plan. She has completed her chemotherapy education.   Of note since the patient's last visit, pt has had a port insertion completed on 04/20/2019. She had no difficulty with her port.   She is to start her chemotherapy consisting of carfilzomib with twice weekly dosing.  Lab results today (04/27/19) of CBC w/diff and CMP is as follows: all values are WNL except for her WBC at 3.3, RBC at 3.23, hemoglobin at 9.9, HCT at 30.7, platelets at 111, nRBC at 0.9, neutro abs at 1.2, and CO2 at 21.  On review of systems, pt denies any symptoms.    MEDICAL HISTORY:  Past Medical History:  Diagnosis Date   Headache    Low back pain    SBO (small bowel obstruction) ([place]) 2010   Smoldering multiple myeloma ([place])   Previous history of hypothyroidism- not currently medications Anxiety Obesity .Body mass index is 37.31 kg/m.  Vitamin D deficiency Smoldering multiple myeloma diagnosed in 2011 Lumbosacral radiculopathy Left hip pain  SURGICAL HISTORY: Past Surgical History:  Procedure Laterality Date   ABDOMINAL HYSTERECTOMY     complete   BACK SURGERY     lower at [facility]   colonscopy  2014   GASTRIC BYPASS  yrs ago   HERNIA REPAIR     IR IMAGING GUIDED PORT INSERTION  04/20/2019   KNEE SURGERY  06/04/2009   both knees replaced    TONSILLECTOMY  age 10   TOTAL HIP ARTHROPLASTY Left 08/16/2017   Procedure: LEFT TOTAL HIP  ARTHROPLASTY ANTERIOR APPROACH;  Surgeon: Dorna Leitz, MD;  Location: [facility];  Service: Orthopedics;  Laterality: Left;  Spinal surgery April 2017  SOCIAL HISTORY: Social History   Socioeconomic History   Marital status: Married    Spouse name: Not on file   Number of children: Not on file   Years of education: Not on file   Highest education level: Not on file  Occupational History   Not on file  Social Needs   Financial resource strain: Not on file   Food insecurity    Worry: Not on file    Inability: Not on file   Transportation needs    Medical: Not on file    Non-medical: Not on file  Tobacco Use   Smoking status: Former Smoker    Packs/day: 0.50    Years: 10.00    Pack years: 5.00    Types: Cigarettes   Smokeless tobacco: Never Used   Tobacco comment: quit 1977  Substance and Sexual Activity   Alcohol use: Yes    Comment: occ   Drug use: No   Sexual activity: Not on file  Lifestyle   Physical activity    Days per week: Not on file    Minutes per session: Not on file   Stress: Not on file  Relationships   Social connections    Talks on phone: Not on file    Gets together: Not on file    Attends religious service: Not on file    Active member of club or organization: Not on file    Attends meetings of clubs or organizations: Not on file    Relationship status: Not on file   Intimate partner violence    Fear of current or ex partner: Not on file    Emotionally abused: Not on file    Physically abused: Not on file    Forced sexual activity: Not on file  Other Topics Concern   Not on file  Social History Narrative   Not on file  Occasional alcohol use Former smoker and smoked 1 pack per week for about 9 years has since quit.  FAMILY HISTORY: No family history on file.  ALLERGIES:  is allergic to codeine.  MEDICATIONS:  Current Outpatient Medications  Medication Sig Dispense Refill   acetaminophen (TYLENOL) 650 MG CR tablet  Take 650 mg by mouth every 8 (eight) hours as needed for pain.     acyclovir (ZOVIRAX) 400 MG tablet Take 1 tablet (400 mg total) by mouth 2 (two) times daily. 30 tablet 3   B Complex-C (B-COMPLEX WITH VITAMIN C) tablet Take 1 tablet by mouth daily.     ECHINACEA-ZINC-VITAMIN C PO Take 1 tablet by mouth daily.     ergocalciferol (VITAMIN D2) 1.25 MG (50000 UT) capsule Take 1 capsule (50,000 Units total) by mouth once a week. 12 capsule 1   lidocaine-prilocaine (EMLA) cream  Apply 1 application topically as needed. 30 g 0   lidocaine-prilocaine (EMLA) cream Apply to affected area once 30 g 3   MULTIPLE VITAMIN PO Take 1 tablet by mouth daily.     ondansetron (ZOFRAN) 8 MG tablet Take 1 tablet (8 mg total) by mouth 2 (two) times daily as needed (Nausea or vomiting). 30 tablet 1   Potassium 99 MG TABS Take 99 mg by mouth daily.     prochlorperazine (COMPAZINE) 10 MG tablet Take 1 tablet (10 mg total) by mouth every 6 (six) hours as needed for nausea or vomiting. 30 tablet 0   prochlorperazine (COMPAZINE) 10 MG tablet Take 1 tablet (10 mg total) by mouth every 6 (six) hours as needed (Nausea or vomiting). 30 tablet 1   vitamin B-12 (CYANOCOBALAMIN) 1000 MCG tablet Take 5,000 mcg by mouth daily.      No current facility-administered medications for this visit.     REVIEW OF SYSTEMS:   A 10+ POINT REVIEW OF SYSTEMS WAS OBTAINED including neurology, dermatology, psychiatry, cardiac, respiratory, lymph, extremities, GI, GU, Musculoskeletal, constitutional, breasts, reproductive, HEENT.  All pertinent positives are noted in the HPI.  All others are negative.     PHYSICAL EXAMINATION: ECOG PERFORMANCE STATUS: 1 - Symptomatic but completely ambulatory  Vitals:   04/27/19 1157  BP: (!) 142/58  Pulse: 74  Resp: 18  Temp: 98.5 F (36.9 C)  SpO2: 100%   Filed Weights   04/27/19 1157  Weight: 210 lb 9.6 oz (95.5 kg)   .Body mass index is 37.31 kg/m.  GENERAL:alert, in no acute  distress and comfortable SKIN: no acute rashes, no significant lesions EYES: conjunctiva are pink and non-injected, sclera anicteric OROPHARYNX: MMM, no exudates, no oropharyngeal erythema or ulceration NECK: supple, no JVD LYMPH:  no palpable lymphadenopathy in the cervical, axillary or inguinal regions LUNGS: clear to auscultation b/l with normal respiratory effort HEART: regular rate & rhythm ABDOMEN:  normoactive bowel sounds , non tender, not distended. Extremity: no pedal edema PSYCH: alert & oriented x 3 with fluent speech NEURO: no focal motor/sensory deficits    LABORATORY DATA:  I have reviewed the data as listed  . CBC Latest Ref Rng & Units 04/27/2019 04/20/2019 04/15/2019  WBC 4.0 - 10.5 K/uL 3.3(L) 2.9(L) 2.5(L)  Hemoglobin 12.0 - 15.0 g/dL 9.9(L) 11.1(L) 9.8(L)  Hematocrit 36.0 - 46.0 % 30.7(L) 34.5(L) 30.3(L)  Platelets 150 - 400 K/uL 111(L) 122(L) 121(L)  ANC 1300 . CBC    Component Value Date/Time   WBC 3.3 (L) 04/27/2019 1137   RBC 3.23 (L) 04/27/2019 1137   HGB 9.9 (L) 04/27/2019 1137   HGB 10.5 (L) 12/01/2018 1256   HGB 11.3 (L) 09/30/2017 1145   HCT 30.7 (L) 04/27/2019 1137   HCT 35.1 09/30/2017 1145   PLT 111 (L) 04/27/2019 1137   PLT 123 (L) 12/01/2018 1256   PLT 172 09/30/2017 1145   MCV 95.0 04/27/2019 1137   MCV 97.5 09/30/2017 1145   MCH 30.7 04/27/2019 1137   MCHC 32.2 04/27/2019 1137   RDW 13.6 04/27/2019 1137   RDW 14.0 09/30/2017 1145   LYMPHSABS 1.9 04/27/2019 1137   LYMPHSABS 1.3 09/30/2017 1145   MONOABS 0.2 04/27/2019 1137   MONOABS 0.2 09/30/2017 1145   EOSABS 0.0 04/27/2019 1137   EOSABS 0.0 09/30/2017 1145   EOSABS 0.1 06/03/2014 1003   BASOSABS 0.0 04/27/2019 1137   BASOSABS 0.0 09/30/2017 1145     . CMP Latest Ref Rng & Units 04/27/2019 04/15/2019  02/23/2019  Glucose 70 - 99 mg/dL 85 88 81  BUN 8 - 23 mg/dL 16 11 13   Creatinine 0.44 - 1.00 mg/dL 0.87 0.94 0.84  Sodium 135 - 145 mmol/L 139 140 140  Potassium 3.5 - 5.1 mmol/L  4.2 4.0 4.3  Chloride 98 - 111 mmol/L 109 109 107  CO2 22 - 32 mmol/L 21(L) 24 24  Calcium 8.9 - 10.3 mg/dL 9.2 8.8(L) 9.4  Total Protein 6.5 - 8.1 g/dL 7.2 7.0 7.0  Total Bilirubin 0.3 - 1.2 mg/dL 0.6 0.6 0.8  Alkaline Phos 38 - 126 U/L 97 99 92  AST 15 - 41 U/L 30 26 29   ALT 0 - 44 U/L 30 29 38   Component     Latest Ref Rng & Units 11/08/2016  LDH     125 - 245 U/L 220  Beta 2     0.6 - 2.4 mg/L 2.5 (H)   Component     Latest Ref Rng & Units 07/30/2017  Ferritin     9 - 269 ng/ml 81  Vitamin B12     232 - 1,245 pg/mL 594    03/11/19 BM Bx:   03/11/19 Cytogenetics:       RADIOGRAPHIC STUDIES: I have personally reviewed the radiological images as listed and agreed with the findings in the report. Nm Pet Image Restage (ps) Whole Body  Result Date: 04/01/2019 CLINICAL DATA:  Subsequent treatment strategy for multiple myeloma. EXAM: NUCLEAR MEDICINE PET WHOLE BODY TECHNIQUE: 10.7 mCi F-18 FDG was injected intravenously. Full-ring PET imaging was performed from the skull base to thigh after the radiotracer. CT data was obtained and used for attenuation correction and anatomic localization. Fasting blood glucose: 88 mg/dl COMPARISON:  PET-CT 03/12/2017 FINDINGS: Mediastinal blood pool activity: SUV max 3.4 HEAD/NECK: No hypermetabolic activity in the scalp. No hypermetabolic cervical lymph nodes. Incidental CT findings: none CHEST: No hypermetabolic mediastinal or hilar nodes. No suspicious pulmonary nodules on the CT scan. Incidental CT findings: none ABDOMEN/PELVIS: No abnormal hypermetabolic activity within the liver, pancreas, adrenal glands, or spleen. No hypermetabolic lymph nodes in the abdomen or pelvis. Incidental CT findings: Choose one gastric bypass anatomy. Post hysterectomy. SKELETON: No focal hypermetabolic activity to suggest skeletal metastasis. Incidental CT findings: none EXTREMITIES: No abnormal hypermetabolic activity in the lower extremities. Incidental CT  findings: LEFT hip prosthetic and bilateral knee prosthetics. IMPRESSION: 1. No FDG evidence of active multiple myeloma within the skeleton. 2. No evidence of lytic lesions within the skeleton or soft tissue plasmacytoma. Electronically Signed   By: Suzy Bouchard M.D.   On: 04/01/2019 15:51   Ir Imaging Guided Port Insertion  Result Date: 04/20/2019 CLINICAL DATA:  MYELOMA, ACCESS FOR CHEMOTHERAPY EXAM: RIGHT INTERNAL JUGULAR SINGLE LUMEN POWER PORT CATHETER INSERTION Date:  04/20/2019 04/20/2019 10:30 am Radiologist:  M. Daryll Brod, MD Guidance:  Ultrasound fluoroscopic MEDICATIONS: Ancef 2 g; The antibiotic was administered within an appropriate time interval prior to skin puncture. ANESTHESIA/SEDATION: Versed 1.0 mg IV; Fentanyl 75 mcg IV; Moderate Sedation Time:  27 minutes The patient was continuously monitored during the procedure by the interventional radiology nurse under my direct supervision. FLUOROSCOPY TIME:  0 minutes, 18 seconds (6 mGy) COMPLICATIONS: None immediate. CONTRAST:  None. PROCEDURE: Informed consent was obtained from the patient following explanation of the procedure, risks, benefits and alternatives. The patient understands, agrees and consents for the procedure. All questions were addressed. A time out was performed. Maximal barrier sterile technique utilized including caps, mask, sterile gowns, sterile  gloves, large sterile drape, hand hygiene, and 2% chlorhexidine scrub. Under sterile conditions and local anesthesia, right internal jugular micropuncture venous access was performed. Access was performed with ultrasound. Images were obtained for documentation of the patent right internal jugular vein. A guide wire was inserted followed by a transitional dilator. This allowed insertion of a guide wire and catheter into the IVC. Measurements were obtained from the SVC / RA junction back to the right IJ venotomy site. In the right infraclavicular chest, a subcutaneous pocket was created  over the second anterior rib. This was done under sterile conditions and local anesthesia. 1% lidocaine with epinephrine was utilized for this. A 2.5 cm incision was made in the skin. Blunt dissection was performed to create a subcutaneous pocket over the right pectoralis major muscle. The pocket was flushed with saline vigorously. There was adequate hemostasis. The port catheter was assembled and checked for leakage. The port catheter was secured in the pocket with two retention sutures. The tubing was tunneled subcutaneously to the right venotomy site and inserted into the SVC/RA junction through a valved peel-away sheath. Position was confirmed with fluoroscopy. Images were obtained for documentation. The patient tolerated the procedure well. No immediate complications. Incisions were closed in a two layer fashion with 4 - 0 Vicryl suture. Dermabond was applied to the skin. The port catheter was accessed, blood was aspirated followed by saline and heparin flushes. Needle was removed. A dry sterile dressing was applied. IMPRESSION: Ultrasound and fluoroscopically guided right internal jugular single lumen power port catheter insertion. Tip in the SVC/RA junction. Catheter ready for use. Electronically Signed   By: Jerilynn Mages.  Shick M.D.   On: 04/20/2019 10:36     Bone survey 11/08/2016 IMPRESSION: 1. Small lytic lesion in the right scapula. 2. Possible small lytic lesion in the left scapula. 3. Postoperative changes. 4. Significant degenerative changes in both hips, left greater than right. 5. No evidence for acute fracture   Electronically Signed   By: Nolon Nations M.D.   On: 11/08/2016 16:19   ASSESSMENT & PLAN:   68 y.o. very pleasant lady with history of   1) Multiple myeloma (concern for small lytic lesions in left and right scapulae) - (appears light chain producing) and Progressive anemia  This was apparently diagnosed in 2011 by her hematologist in [place].  No renal  failure/hypercalcemia.  Bone survey with concern for possible small lytic lesions in B/L scapulae. PET/CT scan on 03/12/2017 showed no concerning bone lesions.  Initial Bm Bx with 17% kappa restricted plasma cells consistent with plasma cell neoplasm.  No treatment so far.  03/11/19 BM Bx revealed involvement by 50% plasma cells; genetics are pending, previous genetics from April 2018 BM Bx were standard risk  03/16/19 MRI Lumbar reveals several explanations for her lower back pain including L2-L3 stenosis, but reassuringly, there was not evidence of involvement by multiple myeloma  04/01/19 PET/CT which revealed no FDG evidence of active multiple myeloma within the skeleton. No evidence of lytic lesions within the skeleton or soft tissue Plasmacytoma.  2) Stable Kappa Lambda SFLC with increased K/L ratio - gradual increase  3) Chronic leukopenia - ? Related to SMM vs other nutritional deficiencies (h/o gastric bypass surgery put her at risk for nutritional deficiencies)  B12 levels WNL  Ferritin adequate  ?additional factor -recent NSAID use.  ?element of benign ethnic neutropenia.  Has not had any issues with frequent infection.   ANC post-operative normalized today at 1700  4) Neuropathy/radiculopathy -Continue  follow up with orthopedics   PLAN:  -Discussed pt labwork today, 04/27/19; all values are WNL except for her WBC at 3.3, RBC at 3.23, hemoglobin at 9.9, HCT at 30.7, platelets at 111, nRBC at 0.9, neutro abs at 1.2, and CO2 at 21. - 04/15/19 SFLC are pending. Last available 02/23/19 SFLC revealed Kappa light chains at 577.2 with K:L ratio of 46.18 -04/03/19 24hr UPEP revealed 3.89g of free kappa light chains  -Discussed chemotherapy treatment and toleration. Should she tolerate carfilzomib well, she will be clear to start Revlimid for cycle 2.   -Return on day 8 of treatment for follow up  FOLLOW UP: Plz schedule all the remaining doses of Carfilzomib as ordered in C1 and  C2 Labs D1,8 and D15 of each cycle RTC with Dr Irene Limbo C1D8 and C2D1 for toxicity checks  I discussed the assessment and treatment plan with the patient. The patient was provided an opportunity to ask questions and all were answered. The patient agreed with the plan and demonstrated an understanding of the instructions.   The patient was advised to call back or seek an in-person evaluation if the symptoms worsen or if the condition fails to improve as anticipated.  The total time spent in the appt was 20 minutes and more than 50% was on counseling and direct patient cares.     Sullivan Lone MD MS AAHIVMS [facility] Hematology/Oncology Physician [facility]  (Office):       [phone] (Work cell):  [phone] (Fax):           [phone]  I, Jacqualyn Posey, am acting as a scribe for Dr. Sullivan Lone.   .I have reviewed the above documentation for accuracy and completeness, and I agree with the above. Brunetta Genera MD

## 2019-04-27 NOTE — Progress Notes (Signed)
Met with patient in lobby to introduce myself as Arboriculturist and to offer available resources.  Patient has 2 insurances therefore copay assistance is not needed.  Discussed one-time $58 Engineer, drilling to assist with personal expenses while going through treatment.  Gave her my card for any additional financial questions or concerns.

## 2019-04-28 ENCOUNTER — Inpatient Hospital Stay: Payer: Medicare Other

## 2019-04-28 ENCOUNTER — Other Ambulatory Visit: Payer: Self-pay

## 2019-04-28 VITALS — BP 105/62 | HR 69 | Temp 97.8°F | Resp 16

## 2019-04-28 DIAGNOSIS — Z5112 Encounter for antineoplastic immunotherapy: Secondary | ICD-10-CM | POA: Diagnosis not present

## 2019-04-28 DIAGNOSIS — E559 Vitamin D deficiency, unspecified: Secondary | ICD-10-CM | POA: Diagnosis not present

## 2019-04-28 DIAGNOSIS — M25551 Pain in right hip: Secondary | ICD-10-CM | POA: Diagnosis not present

## 2019-04-28 DIAGNOSIS — C9000 Multiple myeloma not having achieved remission: Secondary | ICD-10-CM

## 2019-04-28 DIAGNOSIS — M545 Low back pain: Secondary | ICD-10-CM | POA: Diagnosis not present

## 2019-04-28 DIAGNOSIS — E039 Hypothyroidism, unspecified: Secondary | ICD-10-CM | POA: Diagnosis not present

## 2019-04-28 DIAGNOSIS — M25552 Pain in left hip: Secondary | ICD-10-CM | POA: Diagnosis not present

## 2019-04-28 DIAGNOSIS — M48061 Spinal stenosis, lumbar region without neurogenic claudication: Secondary | ICD-10-CM | POA: Diagnosis not present

## 2019-04-28 DIAGNOSIS — Z7189 Other specified counseling: Secondary | ICD-10-CM

## 2019-04-28 DIAGNOSIS — M1611 Unilateral primary osteoarthritis, right hip: Secondary | ICD-10-CM | POA: Diagnosis not present

## 2019-04-28 DIAGNOSIS — G629 Polyneuropathy, unspecified: Secondary | ICD-10-CM | POA: Diagnosis not present

## 2019-04-28 LAB — VITAMIN D 25 HYDROXY (VIT D DEFICIENCY, FRACTURES): Vit D, 25-Hydroxy: 43.4 ng/mL (ref 30.0–100.0)

## 2019-04-28 MED ORDER — SODIUM CHLORIDE 0.9 % IV SOLN
Freq: Once | INTRAVENOUS | Status: AC
  Administered 2019-04-28: 16:00:00 via INTRAVENOUS
  Filled 2019-04-28: qty 250

## 2019-04-28 MED ORDER — ACETAMINOPHEN 500 MG PO TABS
ORAL_TABLET | ORAL | Status: AC
  Filled 2019-04-28: qty 2

## 2019-04-28 MED ORDER — ACETAMINOPHEN 500 MG PO TABS
1000.0000 mg | ORAL_TABLET | Freq: Once | ORAL | Status: AC
  Administered 2019-04-28: 1000 mg via ORAL

## 2019-04-28 MED ORDER — SODIUM CHLORIDE 0.9 % IV SOLN
20.0000 mg | Freq: Once | INTRAVENOUS | Status: AC
  Administered 2019-04-28: 16:00:00 20 mg via INTRAVENOUS
  Filled 2019-04-28: qty 20

## 2019-04-28 MED ORDER — SODIUM CHLORIDE 0.9 % IV SOLN
Freq: Once | INTRAVENOUS | Status: AC
  Filled 2019-04-28: qty 250

## 2019-04-28 MED ORDER — PROCHLORPERAZINE MALEATE 10 MG PO TABS
ORAL_TABLET | ORAL | Status: AC
  Filled 2019-04-28: qty 1

## 2019-04-28 MED ORDER — PROCHLORPERAZINE MALEATE 10 MG PO TABS
10.0000 mg | ORAL_TABLET | Freq: Once | ORAL | Status: AC
  Administered 2019-04-28: 10 mg via ORAL

## 2019-04-28 MED ORDER — DEXTROSE 5 % IV SOLN
19.0000 mg/m2 | Freq: Once | INTRAVENOUS | Status: AC
  Administered 2019-04-28: 17:00:00 40 mg via INTRAVENOUS
  Filled 2019-04-28: qty 15

## 2019-04-28 MED ORDER — SODIUM CHLORIDE 0.9% FLUSH
10.0000 mL | INTRAVENOUS | Status: DC | PRN
  Administered 2019-04-28: 10 mL
  Filled 2019-04-28: qty 10

## 2019-04-28 MED ORDER — HEPARIN SOD (PORK) LOCK FLUSH 100 UNIT/ML IV SOLN
500.0000 [IU] | Freq: Once | INTRAVENOUS | Status: AC | PRN
  Administered 2019-04-28: 500 [IU]
  Filled 2019-04-28: qty 5

## 2019-04-28 NOTE — Patient Instructions (Signed)
[facility] Discharge Instructions for Patients Receiving Chemotherapy  Today you received the following chemotherapy agents:  Kyprolis  To help prevent nausea and vomiting after your treatment, we encourage you to take your nausea medication as prescribed.   If you develop nausea and vomiting that is not controlled by your nausea medication, call the clinic.   BELOW ARE SYMPTOMS THAT SHOULD BE REPORTED IMMEDIATELY:  *FEVER GREATER THAN 100.5 F  *CHILLS WITH OR WITHOUT FEVER  NAUSEA AND VOMITING THAT IS NOT CONTROLLED WITH YOUR NAUSEA MEDICATION  *UNUSUAL SHORTNESS OF BREATH  *UNUSUAL BRUISING OR BLEEDING  TENDERNESS IN MOUTH AND THROAT WITH OR WITHOUT PRESENCE OF ULCERS  *URINARY PROBLEMS  *BOWEL PROBLEMS  UNUSUAL RASH Items with * indicate a potential emergency and should be followed up as soon as possible.  Feel free to call the clinic should you have any questions or concerns. The clinic phone number is [phone].  Please show the [place] at check-in to the Emergency Department and triage nurse.  Coronavirus (COVID-19) Are you at risk?  Are you at risk for the Coronavirus (COVID-19)?  To be considered HIGH RISK for Coronavirus (COVID-19), you have to meet the following criteria:  . Traveled to [place], [place], [place], [place] or [place]; or in the [place] to [place], , [place], or [place]; and have fever, cough, and shortness of breath within the last 2 weeks of travel OR . Been in close contact with a person diagnosed with COVID-19 within the last 2 weeks and have fever, cough, and shortness of breath . IF YOU DO NOT MEET THESE CRITERIA, YOU ARE CONSIDERED LOW RISK FOR COVID-19.  What to do if you are HIGH RISK for COVID-19?  Marland Kitchen If you are having a medical emergency, call 911. . Seek medical care right away. Before you go to a doctor's office, urgent care or emergency department, call ahead and tell them about your  recent travel, contact with someone diagnosed with COVID-19, and your symptoms. You should receive instructions from your physician's office regarding next steps of care.  . When you arrive at healthcare provider, tell the healthcare staff immediately you have returned from visiting [place], [place], [place], [place] or [place]; or traveled in the [place] to [place], [place], [place], or [place]; in the last two weeks or you have been in close contact with a person diagnosed with COVID-19 in the last 2 weeks.   . Tell the health care staff about your symptoms: fever, cough and shortness of breath. . After you have been seen by a medical provider, you will be either: o Tested for (COVID-19) and discharged home on quarantine except to seek medical care if symptoms worsen, and asked to  - Stay home and avoid contact with others until you get your results (4-5 days)  - Avoid travel on public transportation if possible (such as bus, train, or airplane) or o Sent to the Emergency Department by EMS for evaluation, COVID-19 testing, and possible admission depending on your condition and test results.  What to do if you are LOW RISK for COVID-19?  Reduce your risk of any infection by using the same precautions used for avoiding the common cold or flu:  Marland Kitchen Wash your hands often with soap and warm water for at least 20 seconds.  If soap and water are not readily available, use an alcohol-based hand sanitizer with at least 60% alcohol.  . If coughing or  sneezing, cover your mouth and nose by coughing or sneezing into the elbow areas of your shirt or coat, into a tissue or into your sleeve (not your hands). . Avoid shaking hands with others and consider head nods or verbal greetings only. . Avoid touching your eyes, nose, or mouth with unwashed hands.  . Avoid close contact with people who are sick. . Avoid places or events with large numbers of people in one location, like concerts or sporting  events. . Carefully consider travel plans you have or are making. . If you are planning any travel outside or inside the [place], visit the CDC's Travelers' Health webpage for the latest health notices. . If you have some symptoms but not all symptoms, continue to monitor at home and seek medical attention if your symptoms worsen. . If you are having a medical emergency, call 911.   [place] / e-Visit: [email or web address]         [facility]: [facility]: [phone]                   [facility]: [phone]

## 2019-04-29 ENCOUNTER — Telehealth: Payer: Self-pay | Admitting: *Deleted

## 2019-04-29 ENCOUNTER — Other Ambulatory Visit: Payer: Self-pay | Admitting: Hematology

## 2019-04-29 ENCOUNTER — Encounter: Payer: Self-pay | Admitting: Hematology

## 2019-04-29 DIAGNOSIS — C9000 Multiple myeloma not having achieved remission: Secondary | ICD-10-CM

## 2019-04-29 MED ORDER — LENALIDOMIDE 15 MG PO CAPS: 15.0000 mg | ORAL_CAPSULE | Freq: Every day | ORAL | 0 refills | Status: DC

## 2019-04-29 NOTE — Progress Notes (Signed)
Called patient regarding message left from triage RN.  Patient inquiring about income guidelines for one-time $700 [place]. Patient states her household income is over the amount.  She has my card for any additional financial questions or concerns.

## 2019-04-29 NOTE — Telephone Encounter (Signed)
Called Madison Parker at [phone].  Message left requesting a return call for chemotherapy follow up.  Awaiting return call from patient.

## 2019-04-29 NOTE — Telephone Encounter (Addendum)
[name] returned call for chemotherapy F/U.  Patient is doing well.  Denies n/v.  Denies any new side effects or symptoms.  Bowel and bladder functioning well.  Eating and drinking well.  Instructed to drink 64 oz minimum daily or at least the day before, of and after treatment.  Reports "My back does not hurt as bad.  I exercised earlier today.  I was given a business card for an advocate before I saw the doctor Monday.  No questions of you but would like to speak with her."  Denies further questions or needs at this time.  Encouraged to call [phone] Mon -Fri 8:00 am - 4:30 pm or anytime as needed for symptoms, changes or event outside office hours.

## 2019-04-29 NOTE — Telephone Encounter (Signed)
-----   Message from Royston Bake, RN sent at 04/27/2019  1:58 PM EDT ----- Regarding: Dr. Irene Limbo first time Kyprolis Dr. Irene Limbo first time kyprolis.

## 2019-04-30 ENCOUNTER — Telehealth: Payer: Self-pay | Admitting: Pharmacist

## 2019-04-30 ENCOUNTER — Telehealth: Payer: Self-pay | Admitting: *Deleted

## 2019-04-30 DIAGNOSIS — C9000 Multiple myeloma not having achieved remission: Secondary | ICD-10-CM

## 2019-04-30 NOTE — Telephone Encounter (Signed)
Contacted patient to complete [employer] Patient/Physician Agreement for Revlimid electronically. Patient in agreement, and form completed.  [employer] Auth# [id number], 04/30/2019

## 2019-04-30 NOTE — Telephone Encounter (Signed)
Oral Oncology Pharmacist Encounter  Received new prescription for Revlimid (lenalidomide) for the treatment of multiple myeloma not having achieved remission in conjunction with Kyprolis (carfilzomib) and dexamethasone, planned duration 4-6 cycles of induction treatment and then reassessment.  Kyprolis and dexamethasone were initiated on 7/13 Revlimid is planned to be added to regimen starting with cycle 2 at 21m by mouth once daily for 21 days on, 7 days off, repeated every 28 days  Originally diagnosed with smoldering multiple myeloma in 2011  Labs from 04/27/2019 assessed, OK for treatment initiation. SCr=0.97, round to 1.0, est CrCl ~ 80 mL/min Noted ANC slightly reduced to 1.2 and pltc=111k, will continue to be monitored on treatment  Current medication list in Epic reviewed, DDI with Revlimid and echinacea identified:  Category D interaction: echinacea is reported to posses immunostimulatory activity demonstrated by activation of macrophages (releasing IL-1) and the proliferation of B lymphocytes. This theoretically opposes the therapeutic intentions of some immunosuppressive agents negatively impacting efficacy. Patient will be alerted to interaction and ability to discontinue echinacea will be discussed with patient.  I will also speak with patient about initiation of an agent for thromboprophylaxis now that Revlimid is being added to treatment regimen.  Prescription for Revlimid will be e-scribed to appropriate specialty pharmacy for dispensing once insurance authorization and [employer] authorization number are obtained. [facility].  Oral Oncology Clinic will continue to follow for insurance authorization, copayment issues, initial counseling and start date.  JJohny Drilling PharmD, BCPS, BCOP  04/30/2019 8:40 AM Oral Oncology Clinic [phone]

## 2019-05-03 NOTE — Progress Notes (Signed)
[facility]    HEMATOLOGY/ONCOLOGY CLINIC NOTE  Date of Service: 05/04/19     PCP: Marzetta Board MD  CHIEF COMPLAINTS/PURPOSE OF CONSULTATION:  Continued f/u for mx of myeloma   HISTORY OF PRESENTING ILLNESS:   Madison Parker is a wonderful 68 y.o. 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  Patient has a history of smoldering multiple myeloma which she reports she was diagnosed with in 2011 when she was evaluated by a hematologist in [place]. She reports that she presented with low blood counts (low WBC)   and after significant workup she had a bone marrow examination based on which she was told that she has smoldering multiple myeloma. Patient notes that she had a bone survey at that time which was negative. She was monitored there closely and then moved to [facility] in 2015 to stay with her son whose wife was being treated for breast cancer. Patient was following with Dr. Delight Hoh in [place]. She reports not having had any treatment for multiple myeloma.  Her last labs from about a year ago showed a SPEP with no OBSERVED M spike . Serum free light chains showed an elevation of kappa Free light chain 310 and lambda free light chain of 12.7 with an abnormal Kappa/ Lambda ratio of 24.38 ( up from 19 previously). Random urine showed an M protein complement of 44%.  She lives in [place] and requested transfer of care to [place]. She was offered follow-up but [facility] in [place]  But prefers to  follow [place] in [place]. Patient reports her energy levels been stable. She has chronic back pain which has improved since the patient had her spinal surgery in April 2017 (L4-L5 interbody fusion). Patient notes she has had some chronic left hip pain which is somewhat more bothersome with the navy on the lateral aspect of her upper thighs that's painful. She also notes some right hip pain.  Over the last few weeks he notes some upper neck pain especially when bending her neck backwards and tingling numbness in her right upper extremity.  Has been working on losing some weight voluntarily.  No acute other new symptoms.   INTERVAL HISTORY:   Madison Parker returns today for f/u for her smoldering multiple myeloma. The patient's last visit with [place] was on 04/27/2019. The pt reports that she is doing well overall.  She continues on carfilzomib. Today is day 8 cycle 1. The pt has no prohibitive toxicities from continuing this treatment at this time.   The pt reports that she had an increase in her appetite the first few days following her. She notes that there is a discrepancy in the height of her hips that is causing some back pain.   Lab results today (05/04/19) of CBC w/diff and CMP is as follows: all values are WNL except for WBC at 3.7, RBC at 3.06, hemoglobin at 9.4, HCT at 29.2, platelet count at 126K, nRBC at 1.6, calcium at 8.6, and total protein at 6.3.  On review of systems, pt reports increased appetite and denies mouth sores, neuropathy, or any other symptoms.    MEDICAL HISTORY:  Past Medical History:  Diagnosis Date   Headache    Low back pain    SBO (small bowel obstruction) ([place]) 2010   Smoldering multiple myeloma ([place])   Previous history of hypothyroidism- not currently medications Anxiety Obesity .Body mass index is 38.37  kg/m. Vitamin D deficiency Smoldering multiple myeloma diagnosed in 2011 Lumbosacral radiculopathy Left hip pain  SURGICAL HISTORY: Past Surgical History:  Procedure Laterality Date   ABDOMINAL HYSTERECTOMY     complete   BACK SURGERY     lower at [facility]   colonscopy  2014   GASTRIC BYPASS  yrs ago   HERNIA REPAIR     IR IMAGING GUIDED PORT INSERTION  04/20/2019   KNEE SURGERY  06/04/2009   both knees replaced    TONSILLECTOMY  age 57   TOTAL HIP ARTHROPLASTY Left 08/16/2017   Procedure: LEFT TOTAL HIP  ARTHROPLASTY ANTERIOR APPROACH;  Surgeon: Dorna Leitz, MD;  Location: [facility];  Service: Orthopedics;  Laterality: Left;  Spinal surgery April 2017  SOCIAL HISTORY: Social History   Socioeconomic History   Marital status: Married    Spouse name: Not on file   Number of children: Not on file   Years of education: Not on file   Highest education level: Not on file  Occupational History   Not on file  Social Needs   Financial resource strain: Not on file   Food insecurity    Worry: Not on file    Inability: Not on file   Transportation needs    Medical: Not on file    Non-medical: Not on file  Tobacco Use   Smoking status: Former Smoker    Packs/day: 0.50    Years: 10.00    Pack years: 5.00    Types: Cigarettes   Smokeless tobacco: Never Used   Tobacco comment: quit 1977  Substance and Sexual Activity   Alcohol use: Yes    Comment: occ   Drug use: No   Sexual activity: Not on file  Lifestyle   Physical activity    Days per week: Not on file    Minutes per session: Not on file   Stress: Not on file  Relationships   Social connections    Talks on phone: Not on file    Gets together: Not on file    Attends religious service: Not on file    Active member of club or organization: Not on file    Attends meetings of clubs or organizations: Not on file    Relationship status: Not on file   Intimate partner violence    Fear of current or ex partner: Not on file    Emotionally abused: Not on file    Physically abused: Not on file    Forced sexual activity: Not on file  Other Topics Concern   Not on file  Social History Narrative   Not on file  Occasional alcohol use Former smoker and smoked 1 pack per week for about 9 years has since quit.  FAMILY HISTORY: No family history on file.  ALLERGIES:  is allergic to codeine.  MEDICATIONS:  Current Outpatient Medications  Medication Sig Dispense Refill   acetaminophen (TYLENOL) 650 MG CR tablet  Take 650 mg by mouth every 8 (eight) hours as needed for pain.     acyclovir (ZOVIRAX) 400 MG tablet Take 1 tablet (400 mg total) by mouth 2 (two) times daily. 30 tablet 3   B Complex-C (B-COMPLEX WITH VITAMIN C) tablet Take 1 tablet by mouth daily.     ECHINACEA-ZINC-VITAMIN C PO Take 1 tablet by mouth daily.     ergocalciferol (VITAMIN D2) 1.25 MG (50000 UT) capsule Take 1 capsule (50,000 Units total) by mouth once a week. 12 capsule 1   lenalidomide (REVLIMID)  15 MG capsule Take 1 capsule (15 mg total) by mouth daily. [employer] Auth # [id number]     Date Obtained tbd 21 capsule 0   lidocaine-prilocaine (EMLA) cream Apply 1 application topically as needed. 30 g 0   lidocaine-prilocaine (EMLA) cream Apply to affected area once 30 g 3   MULTIPLE VITAMIN PO Take 1 tablet by mouth daily.     ondansetron (ZOFRAN) 8 MG tablet Take 1 tablet (8 mg total) by mouth 2 (two) times daily as needed (Nausea or vomiting). 30 tablet 1   Potassium 99 MG TABS Take 99 mg by mouth daily.     prochlorperazine (COMPAZINE) 10 MG tablet Take 1 tablet (10 mg total) by mouth every 6 (six) hours as needed for nausea or vomiting. 30 tablet 0   prochlorperazine (COMPAZINE) 10 MG tablet Take 1 tablet (10 mg total) by mouth every 6 (six) hours as needed (Nausea or vomiting). 30 tablet 1   vitamin B-12 (CYANOCOBALAMIN) 1000 MCG tablet Take 5,000 mcg by mouth daily.      No current facility-administered medications for this visit.     REVIEW OF SYSTEMS:   A 10+ POINT REVIEW OF SYSTEMS WAS OBTAINED including neurology, dermatology, psychiatry, cardiac, respiratory, lymph, extremities, GI, GU, Musculoskeletal, constitutional, breasts, reproductive, HEENT.  All pertinent positives are noted in the HPI.  All others are negative.    PHYSICAL EXAMINATION: ECOG PERFORMANCE STATUS: 1 - Symptomatic but completely ambulatory  Vitals:   05/04/19 1150  BP: 127/61  Pulse: 78  Resp: 18  Temp: 98 F (36.7 C)  SpO2: 100%    Filed Weights   05/04/19 1150  Weight: 216 lb 9.6 oz (98.2 kg)   .Body mass index is 38.37 kg/m.  GENERAL:alert, in no acute distress and comfortable SKIN: no acute rashes, no significant lesions EYES: conjunctiva are pink and non-injected, sclera anicteric OROPHARYNX: MMM, no exudates, no oropharyngeal erythema or ulceration NECK: supple, no JVD LYMPH:  no palpable lymphadenopathy in the cervical, axillary or inguinal regions LUNGS: clear to auscultation b/l with normal respiratory effort HEART: regular rate & rhythm ABDOMEN:  normoactive bowel sounds , non tender, not distended. Extremity: no pedal edema PSYCH: alert & oriented x 3 with fluent speech NEURO: no focal motor/sensory deficits     LABORATORY DATA:  I have reviewed the data as listed  . CBC Latest Ref Rng & Units 05/04/2019 04/27/2019 04/20/2019  WBC 4.0 - 10.5 K/uL 3.7(L) 3.3(L) 2.9(L)  Hemoglobin 12.0 - 15.0 g/dL 9.4(L) 9.9(L) 11.1(L)  Hematocrit 36.0 - 46.0 % 29.2(L) 30.7(L) 34.5(L)  Platelets 150 - 400 K/uL 126(L) 111(L) 122(L)  ANC 1300 . CBC    Component Value Date/Time   WBC 3.7 (L) 05/04/2019 1137   WBC 3.3 (L) 04/27/2019 1137   RBC 3.06 (L) 05/04/2019 1137   HGB 9.4 (L) 05/04/2019 1137   HGB 11.3 (L) 09/30/2017 1145   HCT 29.2 (L) 05/04/2019 1137   HCT 35.1 09/30/2017 1145   PLT 126 (L) 05/04/2019 1137   PLT 172 09/30/2017 1145   MCV 95.4 05/04/2019 1137   MCV 97.5 09/30/2017 1145   MCH 30.7 05/04/2019 1137   MCHC 32.2 05/04/2019 1137   RDW 14.5 05/04/2019 1137   RDW 14.0 09/30/2017 1145   LYMPHSABS 1.7 05/04/2019 1137   LYMPHSABS 1.3 09/30/2017 1145   MONOABS 0.3 05/04/2019 1137   MONOABS 0.2 09/30/2017 1145   EOSABS 0.0 05/04/2019 1137   EOSABS 0.0 09/30/2017 1145   EOSABS 0.1 06/03/2014 1003  BASOSABS 0.0 05/04/2019 1137   BASOSABS 0.0 09/30/2017 1145     . CMP Latest Ref Rng & Units 05/04/2019 04/27/2019 04/15/2019  Glucose 70 - 99 mg/dL 76 85 88  BUN 8 - 23 mg/dL _0 Creatinine 0.44 - 1.00 mg/dL 0.82 0.87 0.94  Sodium 135 - 145 mmol/L 142 139 140  Potassium 3.5 - 5.1 mmol/L 3.7 4.2 4.0  Chloride 98 - 111 mmol/L 111 109 109  CO2 22 - 32 mmol/L 22 21(L) 24  Calcium 8.9 - 10.3 mg/dL 8.6(L) 9.2 8.8(L)  Total Protein 6.5 - 8.1 g/dL 6.3(L) 7.2 7.0  Total Bilirubin 0.3 - 1.2 mg/dL 0.8 0.6 0.6  Alkaline Phos 38 - 126 U/L 93 97 99  AST 15 - 41 U/L _1 ALT 0 - 44 U/L _2 Component     Latest Ref Rng & Units 11/08/2016  LDH     125 - 245 U/L 220  Beta 2     0.6 - 2.4 mg/L 2.5 (H)   Component     Latest Ref Rng & Units 07/30/2017  Ferritin     9 - 269 ng/ml 81  Vitamin B12     232 - 1,245 pg/mL 594    03/11/19 BM Bx:   03/11/19 Cytogenetics:       RADIOGRAPHIC STUDIES: I have personally reviewed the radiological images as listed and agreed with the findings in the report. Ir Imaging Guided Port Insertion  Result Date: 04/20/2019 CLINICAL DATA:  MYELOMA, ACCESS FOR CHEMOTHERAPY EXAM: RIGHT INTERNAL JUGULAR SINGLE LUMEN POWER PORT CATHETER INSERTION Date:  04/20/2019 04/20/2019 10:30 am Radiologist:  M. Daryll Brod, MD Guidance:  Ultrasound fluoroscopic MEDICATIONS: Ancef 2 g; The antibiotic was administered within an appropriate time interval prior to skin puncture. ANESTHESIA/SEDATION: Versed 1.0 mg IV; Fentanyl 75 mcg IV; Moderate Sedation Time:  27 minutes The patient was continuously monitored during the procedure by the interventional radiology nurse under my direct supervision. FLUOROSCOPY TIME:  0 minutes, 18 seconds (6 mGy) COMPLICATIONS: None immediate. CONTRAST:  None. PROCEDURE: Informed consent was obtained from the patient following explanation of the procedure, risks, benefits and alternatives. The patient understands, agrees and consents for the procedure. All questions were addressed. A time out was performed. Maximal barrier sterile technique utilized including caps, mask, sterile gowns, sterile gloves, large sterile drape,  hand hygiene, and 2% chlorhexidine scrub. Under sterile conditions and local anesthesia, right internal jugular micropuncture venous access was performed. Access was performed with ultrasound. Images were obtained for documentation of the patent right internal jugular vein. A guide wire was inserted followed by a transitional dilator. This allowed insertion of a guide wire and catheter into the IVC. Measurements were obtained from the SVC / RA junction back to the right IJ venotomy site. In the right infraclavicular chest, a subcutaneous pocket was created over the second anterior rib. This was done under sterile conditions and local anesthesia. 1% lidocaine with epinephrine was utilized for this. A 2.5 cm incision was made in the skin. Blunt dissection was performed to create a subcutaneous pocket over the right pectoralis major muscle. The pocket was flushed with saline vigorously. There was adequate hemostasis. The port catheter was assembled and checked for leakage. The port catheter was secured in the pocket with two retention sutures. The tubing was tunneled subcutaneously to the right venotomy site and inserted into the SVC/RA junction through a valved peel-away sheath. Position was confirmed with fluoroscopy. Images  were obtained for documentation. The patient tolerated the procedure well. No immediate complications. Incisions were closed in a two layer fashion with 4 - 0 Vicryl suture. Dermabond was applied to the skin. The port catheter was accessed, blood was aspirated followed by saline and heparin flushes. Needle was removed. A dry sterile dressing was applied. IMPRESSION: Ultrasound and fluoroscopically guided right internal jugular single lumen power port catheter insertion. Tip in the SVC/RA junction. Catheter ready for use. Electronically Signed   By: Jerilynn Mages.  Shick M.D.   On: 04/20/2019 10:36     Bone survey 11/08/2016 IMPRESSION: 1. Small lytic lesion in the right scapula. 2. Possible small lytic  lesion in the left scapula. 3. Postoperative changes. 4. Significant degenerative changes in both hips, left greater than right. 5. No evidence for acute fracture   Electronically Signed   By: Nolon Nations M.D.   On: 11/08/2016 16:19   ASSESSMENT & PLAN:   68 y.o. very pleasant lady with history of   1) Multiple myeloma (concern for small lytic lesions in left and right scapulae) - (appears light chain producing) and Progressive anemia  This was apparently diagnosed as smoldering myeloma in 2011 by her hematologist in [place].  No renal failure/hypercalcemia.  Bone survey with concern for possible small lytic lesions in B/L scapulae. PET/CT scan on 03/12/2017 showed no concerning bone lesions.  Initial Bm Bx with 17% kappa restricted plasma cells consistent with plasma cell neoplasm.  No treatment so far.  03/11/19 BM Bx revealed involvement by 50% plasma cells; genetics are pending, previous genetics from April 2018 BM Bx were standard risk  03/16/19 MRI Lumbar reveals several explanations for her lower back pain including L2-L3 stenosis, but reassuringly, there was not evidence of involvement by multiple myeloma  04/01/19 PET/CT which revealed no FDG evidence of active multiple myeloma within the skeleton. No evidence of lytic lesions within the skeleton or soft tissue Plasmacytoma.  2) Stable Kappa Lambda SFLC with increased K/L ratio - gradual increase  3) Chronic leukopenia - ? Related to SMM vs other nutritional deficiencies (h/o gastric bypass surgery put her at risk for nutritional deficiencies)  B12 levels WNL  Ferritin adequate  ?additional factor -recent NSAID use.  ?element of benign ethnic neutropenia.  Has not had any issues with frequent infection.   ANC post-operative normalized today at 1700  4) Neuropathy/radiculopathy -Continue follow up with orthopedics   PLAN:  -Discussed pt labwork today, 05/04/19; all values are WNL except for  WBC at 3.7, RBC at 3.06, hemoglobin at 9.4, HCT at 29.2, platelet count at 126K, nRBC at 1.6, calcium at 8.6, and total protein at 6.3. -no  Prohibitive toxicities from Carfilzomib and Dexamethasone at this time.- continue rx plan as ordered. -patients notes she has ortho f/u to consider hip injections -Discussed postponing screening colonoscopy  -See her back on day 1 cycle 2 with Revlimid  FOLLOW UP: F/u as per currently scheduled appointment for C1 and C2 of treatment Labs on D1,D8 and D14 each cycle Next MD visit on C2D1    I discussed the assessment and treatment plan with the patient. The patient was provided an opportunity to ask questions and all were answered. The patient agreed with the plan and demonstrated an understanding of the instructions.   The patient was advised to call back or seek an in-person evaluation if the symptoms worsen or if the condition fails to improve as anticipated.  The total time spent in the appt was 20 minutes  and more than 50% was on counseling and direct patient cares.     Sullivan Lone MD [place] AAHIVMS [facility] Hematology/Oncology Physician [facility]  (Office):       [phone] (Work cell):  [phone] (Fax):           [phone]  I, Jacqualyn Posey am acting as a Education administrator for Chauncey Cruel, MD.   .I have reviewed the above documentation for accuracy and completeness, and I agree with the above. Brunetta Genera MD

## 2019-05-04 ENCOUNTER — Inpatient Hospital Stay: Payer: Medicare Other

## 2019-05-04 ENCOUNTER — Other Ambulatory Visit: Payer: Medicare Other

## 2019-05-04 ENCOUNTER — Other Ambulatory Visit: Payer: Self-pay

## 2019-05-04 ENCOUNTER — Inpatient Hospital Stay (HOSPITAL_BASED_OUTPATIENT_CLINIC_OR_DEPARTMENT_OTHER): Payer: Medicare Other | Admitting: Hematology

## 2019-05-04 ENCOUNTER — Encounter: Payer: Self-pay | Admitting: Hematology

## 2019-05-04 ENCOUNTER — Other Ambulatory Visit: Payer: Self-pay | Admitting: *Deleted

## 2019-05-04 VITALS — BP 127/61 | HR 78 | Temp 98.0°F | Resp 18 | Ht 63.0 in | Wt 216.6 lb

## 2019-05-04 DIAGNOSIS — C9000 Multiple myeloma not having achieved remission: Secondary | ICD-10-CM

## 2019-05-04 DIAGNOSIS — G629 Polyneuropathy, unspecified: Secondary | ICD-10-CM

## 2019-05-04 DIAGNOSIS — M545 Low back pain: Secondary | ICD-10-CM | POA: Diagnosis not present

## 2019-05-04 DIAGNOSIS — M48061 Spinal stenosis, lumbar region without neurogenic claudication: Secondary | ICD-10-CM | POA: Diagnosis not present

## 2019-05-04 DIAGNOSIS — E559 Vitamin D deficiency, unspecified: Secondary | ICD-10-CM | POA: Diagnosis not present

## 2019-05-04 DIAGNOSIS — Z95828 Presence of other vascular implants and grafts: Secondary | ICD-10-CM

## 2019-05-04 DIAGNOSIS — Z5112 Encounter for antineoplastic immunotherapy: Secondary | ICD-10-CM | POA: Diagnosis not present

## 2019-05-04 DIAGNOSIS — E039 Hypothyroidism, unspecified: Secondary | ICD-10-CM | POA: Diagnosis not present

## 2019-05-04 DIAGNOSIS — Z7189 Other specified counseling: Secondary | ICD-10-CM

## 2019-05-04 LAB — CMP (CANCER CENTER ONLY)
ALT: 30 U/L (ref 0–44)
AST: 22 U/L (ref 15–41)
Albumin: 3.6 g/dL (ref 3.5–5.0)
Alkaline Phosphatase: 93 U/L (ref 38–126)
Anion gap: 9 (ref 5–15)
BUN: 12 mg/dL (ref 8–23)
CO2: 22 mmol/L (ref 22–32)
Calcium: 8.6 mg/dL — ABNORMAL LOW (ref 8.9–10.3)
Chloride: 111 mmol/L (ref 98–111)
Creatinine: 0.82 mg/dL (ref 0.44–1.00)
GFR, Est AFR Am: >60 mL/min (ref >60)
GFR, Estimated: >60 mL/min (ref >60)
Glucose, Bld: 76 mg/dL (ref 70–99)
Potassium: 3.7 mmol/L (ref 3.5–5.1)
Sodium: 142 mmol/L (ref 135–145)
Total Bilirubin: 0.8 mg/dL (ref 0.3–1.2)
Total Protein: 6.3 g/dL — ABNORMAL LOW (ref 6.5–8.1)

## 2019-05-04 LAB — CBC WITH DIFFERENTIAL (CANCER CENTER ONLY)
Abs Immature Granulocytes: 0.02 10*3/uL (ref 0.00–0.07)
Basophils Absolute: 0 10*3/uL (ref 0.0–0.1)
Basophils Relative: 0 %
Eosinophils Absolute: 0 10*3/uL (ref 0.0–0.5)
Eosinophils Relative: 0 %
HCT: 29.2 % — ABNORMAL LOW (ref 36.0–46.0)
Hemoglobin: 9.4 g/dL — ABNORMAL LOW (ref 12.0–15.0)
Immature Granulocytes: 1 %
Lymphocytes Relative: 44 %
Lymphs Abs: 1.7 10*3/uL (ref 0.7–4.0)
MCH: 30.7 pg (ref 26.0–34.0)
MCHC: 32.2 g/dL (ref 30.0–36.0)
MCV: 95.4 fL (ref 80.0–100.0)
Monocytes Absolute: 0.3 10*3/uL (ref 0.1–1.0)
Monocytes Relative: 8 %
Neutro Abs: 1.8 10*3/uL (ref 1.7–7.7)
Neutrophils Relative %: 47 %
Platelet Count: 126 10*3/uL — ABNORMAL LOW (ref 150–400)
RBC: 3.06 MIL/uL — ABNORMAL LOW (ref 3.87–5.11)
RDW: 14.5 % (ref 11.5–15.5)
WBC Count: 3.7 10*3/uL — ABNORMAL LOW (ref 4.0–10.5)
nRBC: 1.6 % — ABNORMAL HIGH (ref 0.0–0.2)

## 2019-05-04 MED ORDER — SODIUM CHLORIDE 0.9 % IV SOLN
Freq: Once | INTRAVENOUS | Status: DC
  Filled 2019-05-04: qty 250

## 2019-05-04 MED ORDER — ACETAMINOPHEN 500 MG PO TABS
1000.0000 mg | ORAL_TABLET | Freq: Once | ORAL | Status: AC
  Administered 2019-05-04: 1000 mg via ORAL

## 2019-05-04 MED ORDER — HEPARIN SOD (PORK) LOCK FLUSH 100 UNIT/ML IV SOLN
500.0000 [IU] | Freq: Once | INTRAVENOUS | Status: AC
  Administered 2019-05-04: 12:00:00 500 [IU]
  Filled 2019-05-04: qty 5

## 2019-05-04 MED ORDER — SODIUM CHLORIDE 0.9% FLUSH
10.0000 mL | INTRAVENOUS | Status: DC | PRN
  Administered 2019-05-04: 15:00:00 10 mL
  Filled 2019-05-04: qty 10

## 2019-05-04 MED ORDER — ACETAMINOPHEN 500 MG PO TABS
ORAL_TABLET | ORAL | Status: AC
  Filled 2019-05-04: qty 2

## 2019-05-04 MED ORDER — HEPARIN SOD (PORK) LOCK FLUSH 100 UNIT/ML IV SOLN
500.0000 [IU] | Freq: Once | INTRAVENOUS | Status: AC | PRN
  Administered 2019-05-04: 500 [IU]
  Filled 2019-05-04: qty 5

## 2019-05-04 MED ORDER — SODIUM CHLORIDE 0.9 % IV SOLN
Freq: Once | INTRAVENOUS | Status: AC
  Administered 2019-05-04: 13:00:00 via INTRAVENOUS
  Filled 2019-05-04: qty 250

## 2019-05-04 MED ORDER — DEXTROSE 5 % IV SOLN
29.0000 mg/m2 | Freq: Once | INTRAVENOUS | Status: AC
  Administered 2019-05-04: 60 mg via INTRAVENOUS
  Filled 2019-05-04: qty 30

## 2019-05-04 MED ORDER — SODIUM CHLORIDE 0.9% FLUSH
10.0000 mL | Freq: Once | INTRAVENOUS | Status: AC
  Administered 2019-05-04: 10 mL
  Filled 2019-05-04: qty 10

## 2019-05-04 MED ORDER — SODIUM CHLORIDE 0.9 % IV SOLN
20.0000 mg | Freq: Once | INTRAVENOUS | Status: AC
  Administered 2019-05-04: 20 mg via INTRAVENOUS
  Filled 2019-05-04: qty 20

## 2019-05-04 MED ORDER — PROCHLORPERAZINE MALEATE 10 MG PO TABS
10.0000 mg | ORAL_TABLET | Freq: Once | ORAL | Status: AC
  Administered 2019-05-04: 14:00:00 10 mg via ORAL

## 2019-05-04 MED ORDER — PROCHLORPERAZINE MALEATE 10 MG PO TABS
ORAL_TABLET | ORAL | Status: AC
  Filled 2019-05-04: qty 1

## 2019-05-04 NOTE — Patient Instructions (Signed)
[facility] Discharge Instructions for Patients Receiving Chemotherapy  Today you received the following chemotherapy agents:  Kyprolis  To help prevent nausea and vomiting after your treatment, we encourage you to take your nausea medication as prescribed.   If you develop nausea and vomiting that is not controlled by your nausea medication, call the clinic.   BELOW ARE SYMPTOMS THAT SHOULD BE REPORTED IMMEDIATELY:  *FEVER GREATER THAN 100.5 F  *CHILLS WITH OR WITHOUT FEVER  NAUSEA AND VOMITING THAT IS NOT CONTROLLED WITH YOUR NAUSEA MEDICATION  *UNUSUAL SHORTNESS OF BREATH  *UNUSUAL BRUISING OR BLEEDING  TENDERNESS IN MOUTH AND THROAT WITH OR WITHOUT PRESENCE OF ULCERS  *URINARY PROBLEMS  *BOWEL PROBLEMS  UNUSUAL RASH Items with * indicate a potential emergency and should be followed up as soon as possible.  Feel free to call the clinic should you have any questions or concerns. The clinic phone number is [phone].  Please show the [facility] at check-in to the Emergency Department and triage nurse.  Coronavirus (COVID-19) Are you at risk?  Are you at risk for the Coronavirus (COVID-19)?  To be considered HIGH RISK for Coronavirus (COVID-19), you have to meet the following criteria:  . Traveled to [place], [place], [place], [place] or [place]; or in the [place] to [place], [place], [place], or [place]; and have fever, cough, and shortness of breath within the last 2 weeks of travel OR . Been in close contact with a person diagnosed with COVID-19 within the last 2 weeks and have fever, cough, and shortness of breath . IF YOU DO NOT MEET THESE CRITERIA, YOU ARE CONSIDERED LOW RISK FOR COVID-19.  What to do if you are HIGH RISK for COVID-19?  Marland Kitchen If you are having a medical emergency, call 911. . Seek medical care right away. Before you go to a doctor's office, urgent care or emergency department, call ahead and tell them about your  recent travel, contact with someone diagnosed with COVID-19, and your symptoms. You should receive instructions from your physician's office regarding next steps of care.  . When you arrive at healthcare provider, tell the healthcare staff immediately you have returned from visiting [place], [place], [place], [place] or [place]; or traveled in the [place] to [place], [place], [place], or [place]; in the last two weeks or you have been in close contact with a person diagnosed with COVID-19 in the last 2 weeks.   . Tell the health care staff about your symptoms: fever, cough and shortness of breath. . After you have been seen by a medical provider, you will be either: o Tested for (COVID-19) and discharged home on quarantine except to seek medical care if symptoms worsen, and asked to  - Stay home and avoid contact with others until you get your results (4-5 days)  - Avoid travel on public transportation if possible (such as bus, train, or airplane) or o Sent to the Emergency Department by EMS for evaluation, COVID-19 testing, and possible admission depending on your condition and test results.  What to do if you are LOW RISK for COVID-19?  Reduce your risk of any infection by using the same precautions used for avoiding the common cold or flu:  Marland Kitchen Wash your hands often with soap and warm water for at least 20 seconds.  If soap and water are not readily available, use an alcohol-based hand sanitizer with at least 60% alcohol.  . If coughing or  sneezing, cover your mouth and nose by coughing or sneezing into the elbow areas of your shirt or coat, into a tissue or into your sleeve (not your hands). . Avoid shaking hands with others and consider head nods or verbal greetings only. . Avoid touching your eyes, nose, or mouth with unwashed hands.  . Avoid close contact with people who are sick. . Avoid places or events with large numbers of people in one location, like concerts or sporting  events. . Carefully consider travel plans you have or are making. . If you are planning any travel outside or inside the [place], visit the CDC's Travelers' Health webpage for the latest health notices. . If you have some symptoms but not all symptoms, continue to monitor at home and seek medical attention if your symptoms worsen. . If you are having a medical emergency, call 911.   [place] / e-Visit: [email or web address]         [facility]: [facility]: [phone]                   [facility]: [phone]

## 2019-05-05 ENCOUNTER — Telehealth: Payer: Self-pay | Admitting: *Deleted

## 2019-05-05 ENCOUNTER — Ambulatory Visit: Payer: Medicare Other

## 2019-05-05 ENCOUNTER — Telehealth: Payer: Self-pay | Admitting: Hematology

## 2019-05-05 ENCOUNTER — Inpatient Hospital Stay: Payer: Medicare Other

## 2019-05-05 ENCOUNTER — Other Ambulatory Visit: Payer: Self-pay

## 2019-05-05 VITALS — BP 115/66 | HR 63 | Temp 98.6°F | Resp 20

## 2019-05-05 DIAGNOSIS — C9000 Multiple myeloma not having achieved remission: Secondary | ICD-10-CM

## 2019-05-05 DIAGNOSIS — E559 Vitamin D deficiency, unspecified: Secondary | ICD-10-CM | POA: Diagnosis not present

## 2019-05-05 DIAGNOSIS — G629 Polyneuropathy, unspecified: Secondary | ICD-10-CM | POA: Diagnosis not present

## 2019-05-05 DIAGNOSIS — M48061 Spinal stenosis, lumbar region without neurogenic claudication: Secondary | ICD-10-CM | POA: Diagnosis not present

## 2019-05-05 DIAGNOSIS — Z5112 Encounter for antineoplastic immunotherapy: Secondary | ICD-10-CM | POA: Diagnosis not present

## 2019-05-05 DIAGNOSIS — Z7189 Other specified counseling: Secondary | ICD-10-CM

## 2019-05-05 DIAGNOSIS — E039 Hypothyroidism, unspecified: Secondary | ICD-10-CM | POA: Diagnosis not present

## 2019-05-05 LAB — MULTIPLE MYELOMA PANEL, SERUM
Albumin SerPl Elph-Mcnc: 3.4 g/dL (ref 2.9–4.4)
Albumin/Glob SerPl: 1.5 (ref 0.7–1.7)
Alpha 1: 0.2 g/dL (ref 0.0–0.4)
Alpha2 Glob SerPl Elph-Mcnc: 0.5 g/dL (ref 0.4–1.0)
B-Globulin SerPl Elph-Mcnc: 0.9 g/dL (ref 0.7–1.3)
Gamma Glob SerPl Elph-Mcnc: 0.7 g/dL (ref 0.4–1.8)
Globulin, Total: 2.4 g/dL (ref 2.2–3.9)
IgA: 41 mg/dL — ABNORMAL LOW (ref 87–352)
IgG (Immunoglobin G), Serum: 779 mg/dL (ref 586–1602)
IgM (Immunoglobulin M), Srm: 31 mg/dL (ref 26–217)
Total Protein ELP: 5.8 g/dL — ABNORMAL LOW (ref 6.0–8.5)

## 2019-05-05 LAB — KAPPA/LAMBDA LIGHT CHAINS
Kappa free light chain: 298.1 mg/L — ABNORMAL HIGH (ref 3.3–19.4)
Kappa, lambda light chain ratio: 30.42 — ABNORMAL HIGH (ref 0.26–1.65)
Lambda free light chains: 9.8 mg/L (ref 5.7–26.3)

## 2019-05-05 MED ORDER — SODIUM CHLORIDE 0.9 % IV SOLN
Freq: Once | INTRAVENOUS | Status: AC
  Administered 2019-05-05: 15:00:00 via INTRAVENOUS
  Filled 2019-05-05: qty 250

## 2019-05-05 MED ORDER — SODIUM CHLORIDE 0.9% FLUSH
10.0000 mL | INTRAVENOUS | Status: DC | PRN
  Administered 2019-05-05: 16:00:00 10 mL
  Filled 2019-05-05: qty 10

## 2019-05-05 MED ORDER — ACETAMINOPHEN 500 MG PO TABS
1000.0000 mg | ORAL_TABLET | Freq: Once | ORAL | Status: AC
  Administered 2019-05-05: 14:00:00 1000 mg via ORAL

## 2019-05-05 MED ORDER — ACETAMINOPHEN 500 MG PO TABS
ORAL_TABLET | ORAL | Status: AC
  Filled 2019-05-05: qty 2

## 2019-05-05 MED ORDER — SODIUM CHLORIDE 0.9 % IV SOLN
Freq: Once | INTRAVENOUS | Status: AC
  Administered 2019-05-05: 14:00:00 via INTRAVENOUS
  Filled 2019-05-05: qty 250

## 2019-05-05 MED ORDER — DEXTROSE 5 % IV SOLN
29.0000 mg/m2 | Freq: Once | INTRAVENOUS | Status: AC
  Administered 2019-05-05: 60 mg via INTRAVENOUS
  Filled 2019-05-05: qty 30

## 2019-05-05 MED ORDER — HEPARIN SOD (PORK) LOCK FLUSH 100 UNIT/ML IV SOLN
500.0000 [IU] | Freq: Once | INTRAVENOUS | Status: AC | PRN
  Administered 2019-05-05: 16:00:00 500 [IU]
  Filled 2019-05-05: qty 5

## 2019-05-05 MED ORDER — PROCHLORPERAZINE MALEATE 10 MG PO TABS
ORAL_TABLET | ORAL | Status: AC
  Filled 2019-05-05: qty 1

## 2019-05-05 MED ORDER — SODIUM CHLORIDE 0.9 % IV SOLN
20.0000 mg | Freq: Once | INTRAVENOUS | Status: AC
  Administered 2019-05-05: 15:00:00 20 mg via INTRAVENOUS
  Filled 2019-05-05: qty 20

## 2019-05-05 MED ORDER — PROCHLORPERAZINE MALEATE 10 MG PO TABS
10.0000 mg | ORAL_TABLET | Freq: Once | ORAL | Status: AC
  Administered 2019-05-05: 14:00:00 10 mg via ORAL

## 2019-05-05 NOTE — Telephone Encounter (Signed)
Per 7/20 los, appts already scheduled.

## 2019-05-05 NOTE — Patient Instructions (Signed)
[facility] Discharge Instructions for Patients Receiving Chemotherapy  Today you received the following chemotherapy agents Kyprolis  To help prevent nausea and vomiting after your treatment, we encourage you to take your nausea medication as directed by your MD.   If you develop nausea and vomiting that is not controlled by your nausea medication, call the clinic.   BELOW ARE SYMPTOMS THAT SHOULD BE REPORTED IMMEDIATELY:  *FEVER GREATER THAN 100.5 F  *CHILLS WITH OR WITHOUT FEVER  NAUSEA AND VOMITING THAT IS NOT CONTROLLED WITH YOUR NAUSEA MEDICATION  *UNUSUAL SHORTNESS OF BREATH  *UNUSUAL BRUISING OR BLEEDING  TENDERNESS IN MOUTH AND THROAT WITH OR WITHOUT PRESENCE OF ULCERS  *URINARY PROBLEMS  *BOWEL PROBLEMS  UNUSUAL RASH Items with * indicate a potential emergency and should be followed up as soon as possible.  Feel free to call the clinic should you have any questions or concerns. The clinic phone number is [phone].  Please show the [facility] at check-in to the Emergency Department and triage nurse.  Coronavirus (COVID-19) Are you at risk?  Are you at risk for the Coronavirus (COVID-19)?  To be considered HIGH RISK for Coronavirus (COVID-19), you have to meet the following criteria:  . Traveled to [place], [place], [place], [place] or [place]; or in the [place] to [place], [place], [place], or [place]; and have fever, cough, and shortness of breath within the last 2 weeks of travel OR . Been in close contact with a person diagnosed with COVID-19 within the last 2 weeks and have fever, cough, and shortness of breath . IF YOU DO NOT MEET THESE CRITERIA, YOU ARE CONSIDERED LOW RISK FOR COVID-19.  What to do if you are HIGH RISK for COVID-19?  Marland Kitchen If you are having a medical emergency, call 911. . Seek medical care right away. Before you go to a doctor's office, urgent care or emergency department, call ahead and tell them about  your recent travel, contact with someone diagnosed with COVID-19, and your symptoms. You should receive instructions from your physician's office regarding next steps of care.  . When you arrive at healthcare provider, tell the healthcare staff immediately you have returned from visiting [place], [place], [place], [place] or [place]; or traveled in the [place] to [place], [place], [place], or [place]; in the last two weeks or you have been in close contact with a person diagnosed with COVID-19 in the last 2 weeks.   . Tell the health care staff about your symptoms: fever, cough and shortness of breath. . After you have been seen by a medical provider, you will be either: o Tested for (COVID-19) and discharged home on quarantine except to seek medical care if symptoms worsen, and asked to  - Stay home and avoid contact with others until you get your results (4-5 days)  - Avoid travel on public transportation if possible (such as bus, train, or airplane) or o Sent to the Emergency Department by EMS for evaluation, COVID-19 testing, and possible admission depending on your condition and test results.  What to do if you are LOW RISK for COVID-19?  Reduce your risk of any infection by using the same precautions used for avoiding the common cold or flu:  Marland Kitchen Wash your hands often with soap and warm water for at least 20 seconds.  If soap and water are not readily available, use an alcohol-based hand sanitizer with at least 60% alcohol.  . If  coughing or sneezing, cover your mouth and nose by coughing or sneezing into the elbow areas of your shirt or coat, into a tissue or into your sleeve (not your hands). . Avoid shaking hands with others and consider head nods or verbal greetings only. . Avoid touching your eyes, nose, or mouth with unwashed hands.  . Avoid close contact with people who are sick. . Avoid places or events with large numbers of people in one location, like concerts or sporting  events. . Carefully consider travel plans you have or are making. . If you are planning any travel outside or inside the [place], visit the CDC's Travelers' Health webpage for the latest health notices. . If you have some symptoms but not all symptoms, continue to monitor at home and seek medical attention if your symptoms worsen. . If you are having a medical emergency, call 911.   [place] / e-Visit: [email or web address]         [facility]: [facility]: [phone]                   [facility]: [phone]

## 2019-05-05 NOTE — Telephone Encounter (Signed)
Called pt to proceed with nurse triage visit today by phone.  Pt declined.  She said that her doctor that has been managing her multiple myeloma did not want her to have a colonoscopy anytime soon. Referring PCP notified.

## 2019-05-06 ENCOUNTER — Telehealth: Payer: Self-pay

## 2019-05-06 MED ORDER — LENALIDOMIDE 15 MG PO CAPS: 15.0000 mg | ORAL_CAPSULE | Freq: Every day | ORAL | 0 refills | Status: DC

## 2019-05-06 NOTE — Telephone Encounter (Signed)
Oral Oncology Patient Advocate Encounter  PA started on Cover my meds on 7/16. I was waiting on the clinical questions to come back which could take 72 hours. I still have not received clinical questions this morning so I called the [employer] PA line [phone] and completed the PA over the phone.  Approved 04/06/19 - the lifetime of the plan Ref# [id number]  [facility] [place] Phone [phone] Fax [phone] 05/06/2019   9:14 AM

## 2019-05-06 NOTE — Telephone Encounter (Signed)
Oral Oncology Pharmacist Encounter  Received notification of PA approval of Revlimid from [employer]. Revlimid is not available for dispensing at [facility].  Revlimid 15 mg capsule prescription has been e-scribed to [facility] in [place], [place]. Supporting information including insurance information, demographic information, medication list, and insurance authorization has been faxed to dispensing pharmacy.   I will reach out to the patient to discuss initial counseling and site of dispensing once I confirm pharmacy is able to process patient's claim.  Johny Drilling, PharmD, BCPS, BCOP  05/06/2019 9:27 AM Oral Oncology Clinic [phone]

## 2019-05-07 ENCOUNTER — Ambulatory Visit
Admission: RE | Admit: 2019-05-07 | Discharge: 2019-05-07 | Disposition: A | Discharge status: Home / Self Care | Payer: Medicare Other | Source: Ambulatory Visit | Attending: Hematology | Admitting: Hematology

## 2019-05-07 ENCOUNTER — Other Ambulatory Visit: Payer: Self-pay

## 2019-05-07 DIAGNOSIS — E559 Vitamin D deficiency, unspecified: Secondary | ICD-10-CM

## 2019-05-07 DIAGNOSIS — Z780 Asymptomatic menopausal state: Secondary | ICD-10-CM | POA: Diagnosis not present

## 2019-05-07 DIAGNOSIS — M8589 Other specified disorders of bone density and structure, multiple sites: Secondary | ICD-10-CM | POA: Diagnosis not present

## 2019-05-07 DIAGNOSIS — M818 Other osteoporosis without current pathological fracture: Secondary | ICD-10-CM

## 2019-05-07 MED ORDER — LENALIDOMIDE 15 MG PO CAPS: 15.0000 mg | ORAL_CAPSULE | Freq: Every day | ORAL | 0 refills | Status: DC

## 2019-05-07 NOTE — Telephone Encounter (Signed)
Oral Oncology Pharmacist Encounter  Spoke with [facility] DOD department to verify receipt of Revlimid prescription. Pharmacy has received prescription. They are able to process it, copayment due will be $29 per each 4-week fill. Once the prescription is through the steps in their system it will be forwarded to a specialty pharmacist to contact patient to provide REMS program required counseling. Representative stated it could take up to 2 weeks for patient to receive call from pharmacist.  Oral oncology patient advocate contacted patient to provide above information. I will plan to reach out to the patient tomorrow, 05/08/2019, to provide initial counseling.  Johny Drilling, PharmD, BCPS, BCOP  05/07/2019 3:46 PM Oral Oncology Clinic [phone]

## 2019-05-07 NOTE — Telephone Encounter (Signed)
Oral Oncology Pharmacist Encounter  Received notification from [facility] that Revlimid prescription must be filled through [facility] per insurabce requirement. They have transferred supporting information to mandated dispensing pharmacy, but a new prescription must be sent from the office.  Revlimid 15 mg capsule prescription has been e-scribed to [facility] in [place], [place].  I will reach out to the patient to discuss initial counseling and site of dispensing once we confirm pharmacy is able to process patient's claim.  Johny Drilling, PharmD, BCPS, BCOP  05/07/2019  11:52 AM  Oral Oncology Clinic [phone]

## 2019-05-11 ENCOUNTER — Inpatient Hospital Stay: Payer: Medicare Other

## 2019-05-11 ENCOUNTER — Other Ambulatory Visit: Payer: Self-pay

## 2019-05-11 VITALS — BP 116/79 | HR 70 | Temp 98.3°F | Resp 18

## 2019-05-11 DIAGNOSIS — C9000 Multiple myeloma not having achieved remission: Secondary | ICD-10-CM

## 2019-05-11 DIAGNOSIS — G629 Polyneuropathy, unspecified: Secondary | ICD-10-CM | POA: Diagnosis not present

## 2019-05-11 DIAGNOSIS — Z7189 Other specified counseling: Secondary | ICD-10-CM

## 2019-05-11 DIAGNOSIS — M48061 Spinal stenosis, lumbar region without neurogenic claudication: Secondary | ICD-10-CM | POA: Diagnosis not present

## 2019-05-11 DIAGNOSIS — Z95828 Presence of other vascular implants and grafts: Secondary | ICD-10-CM

## 2019-05-11 DIAGNOSIS — Z5112 Encounter for antineoplastic immunotherapy: Secondary | ICD-10-CM | POA: Diagnosis not present

## 2019-05-11 DIAGNOSIS — E039 Hypothyroidism, unspecified: Secondary | ICD-10-CM | POA: Diagnosis not present

## 2019-05-11 DIAGNOSIS — E559 Vitamin D deficiency, unspecified: Secondary | ICD-10-CM | POA: Diagnosis not present

## 2019-05-11 LAB — CMP (CANCER CENTER ONLY)
ALT: 36 U/L (ref 0–44)
AST: 20 U/L (ref 15–41)
Albumin: 3.5 g/dL (ref 3.5–5.0)
Alkaline Phosphatase: 86 U/L (ref 38–126)
Anion gap: 6 (ref 5–15)
BUN: 11 mg/dL (ref 8–23)
CO2: 24 mmol/L (ref 22–32)
Calcium: 8.7 mg/dL — ABNORMAL LOW (ref 8.9–10.3)
Chloride: 110 mmol/L (ref 98–111)
Creatinine: 0.84 mg/dL (ref 0.44–1.00)
GFR, Est AFR Am: >60 mL/min (ref >60)
GFR, Estimated: >60 mL/min (ref >60)
Glucose, Bld: 80 mg/dL (ref 70–99)
Potassium: 4.1 mmol/L (ref 3.5–5.1)
Sodium: 140 mmol/L (ref 135–145)
Total Bilirubin: 0.9 mg/dL (ref 0.3–1.2)
Total Protein: 6 g/dL — ABNORMAL LOW (ref 6.5–8.1)

## 2019-05-11 LAB — CBC WITH DIFFERENTIAL/PLATELET
Abs Immature Granulocytes: 0.02 10*3/uL (ref 0.00–0.07)
Basophils Absolute: 0 10*3/uL (ref 0.0–0.1)
Basophils Relative: 0 %
Eosinophils Absolute: 0 10*3/uL (ref 0.0–0.5)
Eosinophils Relative: 1 %
HCT: 28.3 % — ABNORMAL LOW (ref 36.0–46.0)
Hemoglobin: 9 g/dL — ABNORMAL LOW (ref 12.0–15.0)
Immature Granulocytes: 1 %
Lymphocytes Relative: 37 %
Lymphs Abs: 1.3 10*3/uL (ref 0.7–4.0)
MCH: 31 pg (ref 26.0–34.0)
MCHC: 31.8 g/dL (ref 30.0–36.0)
MCV: 97.6 fL (ref 80.0–100.0)
Monocytes Absolute: 0.3 10*3/uL (ref 0.1–1.0)
Monocytes Relative: 9 %
Neutro Abs: 1.8 10*3/uL (ref 1.7–7.7)
Neutrophils Relative %: 52 %
Platelets: 110 10*3/uL — ABNORMAL LOW (ref 150–400)
RBC: 2.9 MIL/uL — ABNORMAL LOW (ref 3.87–5.11)
RDW: 15.1 % (ref 11.5–15.5)
WBC: 3.4 10*3/uL — ABNORMAL LOW (ref 4.0–10.5)
nRBC: 2 % — ABNORMAL HIGH (ref 0.0–0.2)

## 2019-05-11 MED ORDER — HEPARIN SOD (PORK) LOCK FLUSH 100 UNIT/ML IV SOLN
500.0000 [IU] | Freq: Once | INTRAVENOUS | Status: AC | PRN
  Administered 2019-05-11: 500 [IU]
  Filled 2019-05-11: qty 5

## 2019-05-11 MED ORDER — DEXTROSE 5 % IV SOLN
29.0000 mg/m2 | Freq: Once | INTRAVENOUS | Status: AC
  Administered 2019-05-11: 60 mg via INTRAVENOUS
  Filled 2019-05-11: qty 30

## 2019-05-11 MED ORDER — SODIUM CHLORIDE 0.9 % IV SOLN
20.0000 mg | Freq: Once | INTRAVENOUS | Status: AC
  Administered 2019-05-11: 20 mg via INTRAVENOUS
  Filled 2019-05-11: qty 2

## 2019-05-11 MED ORDER — ACETAMINOPHEN 500 MG PO TABS
ORAL_TABLET | ORAL | Status: AC
  Filled 2019-05-11: qty 2

## 2019-05-11 MED ORDER — SODIUM CHLORIDE 0.9 % IV SOLN
Freq: Once | INTRAVENOUS | Status: AC
  Administered 2019-05-11: 13:00:00 via INTRAVENOUS
  Filled 2019-05-11: qty 250

## 2019-05-11 MED ORDER — ACETAMINOPHEN 500 MG PO TABS
1000.0000 mg | ORAL_TABLET | Freq: Once | ORAL | Status: AC
  Administered 2019-05-11: 1000 mg via ORAL

## 2019-05-11 MED ORDER — SODIUM CHLORIDE 0.9% FLUSH
10.0000 mL | Freq: Once | INTRAVENOUS | Status: AC
  Administered 2019-05-11: 10 mL
  Filled 2019-05-11: qty 10

## 2019-05-11 MED ORDER — PROCHLORPERAZINE MALEATE 10 MG PO TABS
ORAL_TABLET | ORAL | Status: AC
  Filled 2019-05-11: qty 1

## 2019-05-11 MED ORDER — PROCHLORPERAZINE MALEATE 10 MG PO TABS
10.0000 mg | ORAL_TABLET | Freq: Once | ORAL | Status: AC
  Administered 2019-05-11: 10 mg via ORAL

## 2019-05-11 MED ORDER — SODIUM CHLORIDE 0.9% FLUSH
10.0000 mL | INTRAVENOUS | Status: AC | PRN
  Administered 2019-05-11: 10 mL
  Filled 2019-05-11: qty 10

## 2019-05-11 NOTE — Patient Instructions (Signed)
[facility] Discharge Instructions for Patients Receiving Chemotherapy  Today you received the following chemotherapy agents Kyprolis  To help prevent nausea and vomiting after your treatment, we encourage you to take your nausea medication as directed by your MD.   If you develop nausea and vomiting that is not controlled by your nausea medication, call the clinic.   BELOW ARE SYMPTOMS THAT SHOULD BE REPORTED IMMEDIATELY:  *FEVER GREATER THAN 100.5 F  *CHILLS WITH OR WITHOUT FEVER  NAUSEA AND VOMITING THAT IS NOT CONTROLLED WITH YOUR NAUSEA MEDICATION  *UNUSUAL SHORTNESS OF BREATH  *UNUSUAL BRUISING OR BLEEDING  TENDERNESS IN MOUTH AND THROAT WITH OR WITHOUT PRESENCE OF ULCERS  *URINARY PROBLEMS  *BOWEL PROBLEMS  UNUSUAL RASH Items with * indicate a potential emergency and should be followed up as soon as possible.  Feel free to call the clinic should you have any questions or concerns. The clinic phone number is [phone].  Please show the [place] at check-in to the Emergency Department and triage nurse.  Coronavirus (COVID-19) Are you at risk?  Are you at risk for the Coronavirus (COVID-19)?  To be considered HIGH RISK for Coronavirus (COVID-19), you have to meet the following criteria:  . Traveled to [place], [place], [place], [place] or [place]; or in the [place] to [place], [place], [place], or [place]; and have fever, cough, and shortness of breath within the last 2 weeks of travel OR . Been in close contact with a person diagnosed with COVID-19 within the last 2 weeks and have fever, cough, and shortness of breath . IF YOU DO NOT MEET THESE CRITERIA, YOU ARE CONSIDERED LOW RISK FOR COVID-19.  What to do if you are HIGH RISK for COVID-19?  Marland Kitchen If you are having a medical emergency, call 911. . Seek medical care right away. Before you go to a doctor's office, urgent care or emergency department, call ahead and tell them about  your recent travel, contact with someone diagnosed with COVID-19, and your symptoms. You should receive instructions from your physician's office regarding next steps of care.  . When you arrive at healthcare provider, tell the healthcare staff immediately you have returned from visiting [place], [place], [place], [place] or [place]; or traveled in the [place] to [place], [place], [place], or [place]; in the last two weeks or you have been in close contact with a person diagnosed with COVID-19 in the last 2 weeks.   . Tell the health care staff about your symptoms: fever, cough and shortness of breath. . After you have been seen by a medical provider, you will be either: o Tested for (COVID-19) and discharged home on quarantine except to seek medical care if symptoms worsen, and asked to  - Stay home and avoid contact with others until you get your results (4-5 days)  - Avoid travel on public transportation if possible (such as bus, train, or airplane) or o Sent to the Emergency Department by EMS for evaluation, COVID-19 testing, and possible admission depending on your condition and test results.  What to do if you are LOW RISK for COVID-19?  Reduce your risk of any infection by using the same precautions used for avoiding the common cold or flu:  Marland Kitchen Wash your hands often with soap and warm water for at least 20 seconds.  If soap and water are not readily available, use an alcohol-based hand sanitizer with at least 60% alcohol.  . If  coughing or sneezing, cover your mouth and nose by coughing or sneezing into the elbow areas of your shirt or coat, into a tissue or into your sleeve (not your hands). . Avoid shaking hands with others and consider head nods or verbal greetings only. . Avoid touching your eyes, nose, or mouth with unwashed hands.  . Avoid close contact with people who are sick. . Avoid places or events with large numbers of people in one location, like concerts or sporting  events. . Carefully consider travel plans you have or are making. . If you are planning any travel outside or inside the [place], visit the CDC's Travelers' Health webpage for the latest health notices. . If you have some symptoms but not all symptoms, continue to monitor at home and seek medical attention if your symptoms worsen. . If you are having a medical emergency, call 911.   [place] / e-Visit: [email or web address]         [facility]: [facility]: [phone]                   [facility]: [phone]

## 2019-05-11 NOTE — Telephone Encounter (Signed)
Oral Chemotherapy Pharmacist Encounter   I spoke with patient for overview of: Revlimid (lenalidomide) for the treatment of multiple myeloma not having achieved remission in conjunction with Kyprolis (carfilzomib) and dexamethasone, planned duration 4-6 cycles of induction treatment and then reassessment.   Counseled patient on administration, dosing, side effects, monitoring, drug-food interactions, safe handling, storage, and disposal.  Patient will take Revlimid 58m capsules, 1 capsule by mouth once daily, without regard to food, with a full glass of water. She will take Revlimid be fore bed each evening on Revlimid days.  Revlimid will be given 21 days on, 7 days off, repeat every 28 days.  Revlimid start date: 05/25/2019 (cycle 2 day 1)  Adverse effects of Revlimid include but are not limited to: nausea, constipation, diarrhea, abdominal pain, rash, fatigue, drug fever, and decreased blood counts.    Reviewed with patient importance of keeping a medication schedule and plan for any missed doses.  Medication reconciliation performed and medication/allergy list updated.  Patient continues on acyclovir 400 mg BID for VZV prophylaxis. Patient has already started aspirin 81 mg once daily for thromboprophylaxis. Importance of both of these supportive therapy medications reviewed with patient. Patient has already discontinued echinacea, medication list has been updated.  Insurance authorization for Revlimid has been obtained.  [facility] requirement. Copayment $29 per fill. She has not yet heard from the pharmacy about dispense date of Revlimid.  All questions answered.  Ms. BYonovoiced understanding and appreciation.   Patient knows to call the office with questions or concerns.  JJohny Drilling PharmD, BCPS, BCOP  05/11/2019    10:09 AM Oral Oncology Clinic [phone]

## 2019-05-12 ENCOUNTER — Inpatient Hospital Stay: Payer: Medicare Other

## 2019-05-12 ENCOUNTER — Other Ambulatory Visit: Payer: Self-pay

## 2019-05-12 VITALS — BP 121/69 | HR 84 | Temp 98.7°F | Resp 18

## 2019-05-12 DIAGNOSIS — E559 Vitamin D deficiency, unspecified: Secondary | ICD-10-CM | POA: Diagnosis not present

## 2019-05-12 DIAGNOSIS — C9000 Multiple myeloma not having achieved remission: Secondary | ICD-10-CM | POA: Diagnosis not present

## 2019-05-12 DIAGNOSIS — Z7189 Other specified counseling: Secondary | ICD-10-CM

## 2019-05-12 DIAGNOSIS — G629 Polyneuropathy, unspecified: Secondary | ICD-10-CM | POA: Diagnosis not present

## 2019-05-12 DIAGNOSIS — Z5112 Encounter for antineoplastic immunotherapy: Secondary | ICD-10-CM | POA: Diagnosis not present

## 2019-05-12 DIAGNOSIS — M48061 Spinal stenosis, lumbar region without neurogenic claudication: Secondary | ICD-10-CM | POA: Diagnosis not present

## 2019-05-12 DIAGNOSIS — E039 Hypothyroidism, unspecified: Secondary | ICD-10-CM | POA: Diagnosis not present

## 2019-05-12 MED ORDER — SODIUM CHLORIDE 0.9 % IV SOLN
Freq: Once | INTRAVENOUS | Status: DC
  Filled 2019-05-12: qty 250

## 2019-05-12 MED ORDER — HEPARIN SOD (PORK) LOCK FLUSH 100 UNIT/ML IV SOLN
500.0000 [IU] | Freq: Once | INTRAVENOUS | Status: AC | PRN
  Administered 2019-05-12: 16:00:00 500 [IU]
  Filled 2019-05-12: qty 5

## 2019-05-12 MED ORDER — SODIUM CHLORIDE 0.9% FLUSH
10.0000 mL | INTRAVENOUS | Status: DC | PRN
  Administered 2019-05-12: 10 mL
  Filled 2019-05-12: qty 10

## 2019-05-12 MED ORDER — PROCHLORPERAZINE MALEATE 10 MG PO TABS
10.0000 mg | ORAL_TABLET | Freq: Once | ORAL | Status: AC
  Administered 2019-05-12: 10 mg via ORAL

## 2019-05-12 MED ORDER — SODIUM CHLORIDE 0.9 % IV SOLN
Freq: Once | INTRAVENOUS | Status: AC
  Administered 2019-05-12: 15:00:00 via INTRAVENOUS
  Filled 2019-05-12: qty 250

## 2019-05-12 MED ORDER — ACETAMINOPHEN 500 MG PO TABS
1000.0000 mg | ORAL_TABLET | Freq: Once | ORAL | Status: AC
  Administered 2019-05-12: 1000 mg via ORAL

## 2019-05-12 MED ORDER — ACETAMINOPHEN 500 MG PO TABS
ORAL_TABLET | ORAL | Status: AC
  Filled 2019-05-12: qty 2

## 2019-05-12 MED ORDER — SODIUM CHLORIDE 0.9 % IV SOLN
20.0000 mg | Freq: Once | INTRAVENOUS | Status: AC
  Administered 2019-05-12: 20 mg via INTRAVENOUS
  Filled 2019-05-12: qty 2

## 2019-05-12 MED ORDER — DEXTROSE 5 % IV SOLN
60.0000 mg | Freq: Once | INTRAVENOUS | Status: AC
  Administered 2019-05-12: 60 mg via INTRAVENOUS
  Filled 2019-05-12: qty 30

## 2019-05-12 MED ORDER — PROCHLORPERAZINE MALEATE 10 MG PO TABS
ORAL_TABLET | ORAL | Status: AC
  Filled 2019-05-12: qty 1

## 2019-05-12 NOTE — Patient Instructions (Signed)
[facility] Discharge Instructions for Patients Receiving Chemotherapy  Today you received the following chemotherapy agents Kyprolis  To help prevent nausea and vomiting after your treatment, we encourage you to take your nausea medication as directed by your MD.   If you develop nausea and vomiting that is not controlled by your nausea medication, call the clinic.   BELOW ARE SYMPTOMS THAT SHOULD BE REPORTED IMMEDIATELY:  *FEVER GREATER THAN 100.5 F  *CHILLS WITH OR WITHOUT FEVER  NAUSEA AND VOMITING THAT IS NOT CONTROLLED WITH YOUR NAUSEA MEDICATION  *UNUSUAL SHORTNESS OF BREATH  *UNUSUAL BRUISING OR BLEEDING  TENDERNESS IN MOUTH AND THROAT WITH OR WITHOUT PRESENCE OF ULCERS  *URINARY PROBLEMS  *BOWEL PROBLEMS  UNUSUAL RASH Items with * indicate a potential emergency and should be followed up as soon as possible.  Feel free to call the clinic should you have any questions or concerns. The clinic phone number is [phone].  Please show the [facility] at check-in to the Emergency Department and triage nurse.  Coronavirus (COVID-19) Are you at risk?  Are you at risk for the Coronavirus (COVID-19)?  To be considered HIGH RISK for Coronavirus (COVID-19), you have to meet the following criteria:  . Traveled to [place], [place], [place], [place] or [place]; or in the [place] to [place], [place], [place], or [place]; and have fever, cough, and shortness of breath within the last 2 weeks of travel OR . Been in close contact with a person diagnosed with COVID-19 within the last 2 weeks and have fever, cough, and shortness of breath . IF YOU DO NOT MEET THESE CRITERIA, YOU ARE CONSIDERED LOW RISK FOR COVID-19.  What to do if you are HIGH RISK for COVID-19?  Marland Kitchen If you are having a medical emergency, call 911. . Seek medical care right away. Before you go to a doctor's office, urgent care or emergency department, call ahead and tell them about  your recent travel, contact with someone diagnosed with COVID-19, and your symptoms. You should receive instructions from your physician's office regarding next steps of care.  . When you arrive at healthcare provider, tell the healthcare staff immediately you have returned from visiting [place], [place], [place], [place] or [place]; or traveled in the [place] to [place], [place], [place], or [place]; in the last two weeks or you have been in close contact with a person diagnosed with COVID-19 in the last 2 weeks.   . Tell the health care staff about your symptoms: fever, cough and shortness of breath. . After you have been seen by a medical provider, you will be either: o Tested for (COVID-19) and discharged home on quarantine except to seek medical care if symptoms worsen, and asked to  - Stay home and avoid contact with others until you get your results (4-5 days)  - Avoid travel on public transportation if possible (such as bus, train, or airplane) or o Sent to the Emergency Department by EMS for evaluation, COVID-19 testing, and possible admission depending on your condition and test results.  What to do if you are LOW RISK for COVID-19?  Reduce your risk of any infection by using the same precautions used for avoiding the common cold or flu:  Marland Kitchen Wash your hands often with soap and warm water for at least 20 seconds.  If soap and water are not readily available, use an alcohol-based hand sanitizer with at least 60% alcohol.  . If  coughing or sneezing, cover your mouth and nose by coughing or sneezing into the elbow areas of your shirt or coat, into a tissue or into your sleeve (not your hands). . Avoid shaking hands with others and consider head nods or verbal greetings only. . Avoid touching your eyes, nose, or mouth with unwashed hands.  . Avoid close contact with people who are sick. . Avoid places or events with large numbers of people in one location, like concerts or sporting  events. . Carefully consider travel plans you have or are making. . If you are planning any travel outside or inside the [place], visit the CDC's Travelers' Health webpage for the latest health notices. . If you have some symptoms but not all symptoms, continue to monitor at home and seek medical attention if your symptoms worsen. . If you are having a medical emergency, call 911.   [place] / e-Visit: [email or web address]         [facility]: [facility]: [phone]                   [facility]: [phone]

## 2019-05-12 NOTE — Telephone Encounter (Signed)
Oral Oncology Patient Advocate Encounter  I followed up with [facility] on the Revlimid prescription.  The representative informed me that they just processed the prescription today and would be calling the patient to schedule shipment.  [name] [place] Phone [phone] Fax [phone] 05/12/2019   2:35 PM

## 2019-05-18 DIAGNOSIS — M47816 Spondylosis without myelopathy or radiculopathy, lumbar region: Secondary | ICD-10-CM | POA: Diagnosis not present

## 2019-05-24 NOTE — Progress Notes (Signed)
Marland Kitchen    HEMATOLOGY/ONCOLOGY CLINIC NOTE  Date of Service: 05/24/19     PCP: Marzetta Board MD  CHIEF COMPLAINTS/PURPOSE OF CONSULTATION:  Continued f/u for mx of myeloma   HISTORY OF PRESENTING ILLNESS:   Madison Parker is a wonderful 68 y.o. 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  Patient has a history of smoldering multiple myeloma which she reports she was diagnosed with in 2011 when she was evaluated by a hematologist in [place]. She reports that she presented with low blood counts (low WBC)   and after significant workup she had a bone marrow examination based on which she was told that she has smoldering multiple myeloma. Patient notes that she had a bone survey at that time which was negative. She was monitored there closely and then moved to [facility] in 2015 to stay with her son whose wife was being treated for breast cancer. Patient was following with Dr. Delight Hoh in [place]. She reports not having had any treatment for multiple myeloma.  Her last labs from about a year ago showed a SPEP with no OBSERVED M spike . Serum free light chains showed an elevation of kappa Free light chain 310 and lambda free light chain of 12.7 with an abnormal Kappa/ Lambda ratio of 24.38 ( up from 19 previously). Random urine showed an M protein complement of 44%.  She lives in [place] and requested transfer of care to [place]. She was offered follow-up but [facility] in [place]  But prefers to  follow [place] in [place]. Patient reports her energy levels been stable. She has chronic back pain which has improved since the patient had her spinal surgery in April 2017 (L4-L5 interbody fusion). Patient notes she has had some chronic left hip pain which is somewhat more bothersome with the navy on the lateral aspect of her upper thighs that's painful. She also notes some right hip pain.  Over the last few weeks he notes some upper neck pain especially when bending her neck backwards and tingling numbness in her right upper extremity.  Has been working on losing some weight voluntarily.  No acute other new symptoms.   INTERVAL HISTORY:   Madison Parker returns today for f/u for her smoldering multiple myeloma. The patient's last visit with [place] was on 05/04/2019. The pt reports that she is doing well overall. Tomorrow is C2D1 of Carfilzomib and Dexamethasone. Her first dose of Revlimid is tomorrow.   The pt reports no problems tolerating her first cycle of Carfilzomib and Dexamethasone. She reports some fatigue, but she says it is not bad. Denies rashes diarrhea, sores, vomiting, and nausea. Her back pain is managed well with Tylenol BID  Of note since the patient's last visit, pt has had DXA scan completed on 05/07/2019 with results revealing osteopenia, T-score of -1.2.  Lab results today (05/19/2019) of CBC w/diff and CMP is as follows: all values are WNL except for WBC at 3.7k, RBC at 3.23, HGB at 10.3, HCT at 32.3, RDW at 15.8, nRBC at 0.8, CO2 at 19, glucose bld at 110, Creatinine at 1.06. 05/25/2019 MMP is pending 05/25/2019 K/L light chains is pending  On review of systems, pt reports fatigue, back pain, and denies diarrhea, sores, vomiting, nausea, belly pain, problems with port, leg swelling, and any other symptoms.   MEDICAL HISTORY:  Past Medical History:  Diagnosis Date  . Headache   .  Low back pain   . SBO (small bowel obstruction) ([facility]) 2010  . Smoldering multiple myeloma ([facility])   Previous history of hypothyroidism- not currently medications Anxiety Obesity .There is no height or weight on file to calculate BMI. Vitamin D deficiency Smoldering multiple myeloma diagnosed in 2011 Lumbosacral radiculopathy Left hip pain  SURGICAL HISTORY: Past Surgical History:  Procedure Laterality Date  . ABDOMINAL HYSTERECTOMY     complete  . BACK SURGERY     lower  at [facility]  . colonscopy  2014  . GASTRIC BYPASS  yrs ago  . HERNIA REPAIR    . IR IMAGING GUIDED PORT INSERTION  04/20/2019  . KNEE SURGERY  06/04/2009   both knees replaced   . TONSILLECTOMY  age 79  . TOTAL HIP ARTHROPLASTY Left 08/16/2017   Procedure: LEFT TOTAL HIP ARTHROPLASTY ANTERIOR APPROACH;  Surgeon: Dorna Leitz, MD;  Location: [facility];  Service: Orthopedics;  Laterality: Left;  Spinal surgery April 2017  SOCIAL HISTORY: Social History   Socioeconomic History  . Marital status: Married    Spouse name: Not on file  . Number of children: Not on file  . Years of education: Not on file  . Highest education level: Not on file  Occupational History  . Not on file  Social Needs  . Financial resource strain: Not on file  . Food insecurity    Worry: Not on file    Inability: Not on file  . Transportation needs    Medical: Not on file    Non-medical: Not on file  Tobacco Use  . Smoking status: Former Smoker    Packs/day: 0.50    Years: 10.00    Pack years: 5.00    Types: Cigarettes  . Smokeless tobacco: Never Used  . Tobacco comment: quit 1977  Substance and Sexual Activity  . Alcohol use: Yes    Comment: occ  . Drug use: No  . Sexual activity: Not on file  Lifestyle  . Physical activity    Days per week: Not on file    Minutes per session: Not on file  . Stress: Not on file  Relationships  . Social Herbalist on phone: Not on file    Gets together: Not on file    Attends religious service: Not on file    Active member of club or organization: Not on file    Attends meetings of clubs or organizations: Not on file    Relationship status: Not on file  . Intimate partner violence    Fear of current or ex partner: Not on file    Emotionally abused: Not on file    Physically abused: Not on file    Forced sexual activity: Not on file  Other Topics Concern  . Not on file  Social History Narrative  . Not on file  Occasional alcohol use Former smoker  and smoked 1 pack per week for about 9 years has since quit.  FAMILY HISTORY: No family history on file.  ALLERGIES:  is allergic to codeine.  MEDICATIONS:  Current Outpatient Medications  Medication Sig Dispense Refill  . acetaminophen (TYLENOL) 650 MG CR tablet Take 650 mg by mouth every 8 (eight) hours as needed for pain.    Marland Kitchen acyclovir (ZOVIRAX) 400 MG tablet Take 1 tablet (400 mg total) by mouth 2 (two) times daily. 30 tablet 3  . ascorbic acid (VITAMIN C) 500 MG tablet Take 500 mg by mouth daily.    Marland Kitchen  aspirin EC 81 MG tablet Take 81 mg by mouth daily.    . B Complex-C (B-COMPLEX WITH VITAMIN C) tablet Take 1 tablet by mouth daily.    . ergocalciferol (VITAMIN D2) 1.25 MG (50000 UT) capsule Take 1 capsule (50,000 Units total) by mouth once a week. 12 capsule 1  . lenalidomide (REVLIMID) 15 MG capsule Take 1 capsule (15 mg total) by mouth daily. Take for 21 d on, 7 d off, repeat every 28d. [employer] Auth # [id number]    Date Obtained: 04/30/19 21 capsule 0  . lidocaine-prilocaine (EMLA) cream Apply 1 application topically as needed. 30 g 0  . lidocaine-prilocaine (EMLA) cream Apply to affected area once 30 g 3  . MULTIPLE VITAMIN PO Take 1 tablet by mouth daily.    . ondansetron (ZOFRAN) 8 MG tablet Take 1 tablet (8 mg total) by mouth 2 (two) times daily as needed (Nausea or vomiting). 30 tablet 1  . Potassium 99 MG TABS Take 99 mg by mouth daily.    . prochlorperazine (COMPAZINE) 10 MG tablet Take 1 tablet (10 mg total) by mouth every 6 (six) hours as needed for nausea or vomiting. 30 tablet 0  . prochlorperazine (COMPAZINE) 10 MG tablet Take 1 tablet (10 mg total) by mouth every 6 (six) hours as needed (Nausea or vomiting). 30 tablet 1  . vitamin B-12 (CYANOCOBALAMIN) 1000 MCG tablet Take 5,000 mcg by mouth daily.      No current facility-administered medications for this visit.    Facility-Administered Medications Ordered in Other Visits  Medication Dose Route Frequency Provider Last  Rate Last Dose  . sodium chloride flush (NS) 0.9 % injection 10 mL  10 mL Intracatheter PRN Brunetta Genera, MD   10 mL at 05/11/19 1437    REVIEW OF SYSTEMS:    A 10+ POINT REVIEW OF SYSTEMS WAS OBTAINED including neurology, dermatology, psychiatry, cardiac, respiratory, lymph, extremities, GI, GU, Musculoskeletal, constitutional, breasts, reproductive, HEENT.  All pertinent positives are noted in the HPI.  All others are negative.    PHYSICAL EXAMINATION: ECOG PERFORMANCE STATUS: 1 - Symptomatic but completely ambulatory  There were no vitals filed for this visit. There were no vitals filed for this visit. .There is no height or weight on file to calculate BMI.   GENERAL:alert, in no acute distress and comfortable SKIN: no acute rashes, no significant lesions EYES: conjunctiva are pink and non-injected, sclera anicteric OROPHARYNX: MMM, no exudates, no oropharyngeal erythema or ulceration NECK: supple, no JVD LYMPH:  no palpable lymphadenopathy in the cervical, axillary or inguinal regions LUNGS: clear to auscultation b/l with normal respiratory effort HEART: regular rate & rhythm ABDOMEN:  normoactive bowel sounds , non tender, not distended. No palpable hepatosplenomegaly.  Extremity: no pedal edema PSYCH: alert & oriented x 3 with fluent speech NEURO: no focal motor/sensory deficits    LABORATORY DATA:  I have reviewed the data as listed  . CBC Latest Ref Rng & Units 05/11/2019 05/04/2019 04/27/2019  WBC 4.0 - 10.5 K/uL 3.4(L) 3.7(L) 3.3(L)  Hemoglobin 12.0 - 15.0 g/dL 9.0(L) 9.4(L) 9.9(L)  Hematocrit 36.0 - 46.0 % 28.3(L) 29.2(L) 30.7(L)  Platelets 150 - 400 K/uL 110(L) 126(L) 111(L)  ANC 1300 . CBC    Component Value Date/Time   WBC 3.4 (L) 05/11/2019 1129   RBC 2.90 (L) 05/11/2019 1129   HGB 9.0 (L) 05/11/2019 1129   HGB 9.4 (L) 05/04/2019 1137   HGB 11.3 (L) 09/30/2017 1145   HCT 28.3 (L) 05/11/2019 1129  HCT 35.1 09/30/2017 1145   PLT 110 (L) 05/11/2019  1129   PLT 126 (L) 05/04/2019 1137   PLT 172 09/30/2017 1145   MCV 97.6 05/11/2019 1129   MCV 97.5 09/30/2017 1145   MCH 31.0 05/11/2019 1129   MCHC 31.8 05/11/2019 1129   RDW 15.1 05/11/2019 1129   RDW 14.0 09/30/2017 1145   LYMPHSABS 1.3 05/11/2019 1129   LYMPHSABS 1.3 09/30/2017 1145   MONOABS 0.3 05/11/2019 1129   MONOABS 0.2 09/30/2017 1145   EOSABS 0.0 05/11/2019 1129   EOSABS 0.0 09/30/2017 1145   EOSABS 0.1 06/03/2014 1003   BASOSABS 0.0 05/11/2019 1129   BASOSABS 0.0 09/30/2017 1145     . CMP Latest Ref Rng & Units 05/11/2019 05/04/2019 04/27/2019  Glucose 70 - 99 mg/dL 80 76 85  BUN 8 - 23 mg/dL 11 12 16   Creatinine 0.44 - 1.00 mg/dL 0.84 0.82 0.87  Sodium 135 - 145 mmol/L 140 142 139  Potassium 3.5 - 5.1 mmol/L 4.1 3.7 4.2  Chloride 98 - 111 mmol/L 110 111 109  CO2 22 - 32 mmol/L 24 22 21(L)  Calcium 8.9 - 10.3 mg/dL 8.7(L) 8.6(L) 9.2  Total Protein 6.5 - 8.1 g/dL 6.0(L) 6.3(L) 7.2  Total Bilirubin 0.3 - 1.2 mg/dL 0.9 0.8 0.6  Alkaline Phos 38 - 126 U/L 86 93 97  AST 15 - 41 U/L 20 22 30   ALT 0 - 44 U/L 36 30 30        Component     Latest Ref Rng & Units 11/08/2016  LDH     125 - 245 U/L 220  Beta 2     0.6 - 2.4 mg/L 2.5 (H)   Component     Latest Ref Rng & Units 07/30/2017  Ferritin     9 - 269 ng/ml 81  Vitamin B12     232 - 1,245 pg/mL 594    03/11/19 BM Bx:   03/11/19 Cytogenetics:       RADIOGRAPHIC STUDIES: I have personally reviewed the radiological images as listed and agreed with the findings in the report. Dg Bone Density  Result Date: 05/07/2019 EXAM: DUAL X-RAY ABSORPTIOMETRY (DXA) FOR BONE MINERAL DENSITY IMPRESSION: Referring Physician:  Brunetta Genera Your patient completed a BMD test using Lunar IDXA DXA system ( analysis version: 16 ) manufactured by [employer]. Technologist: AW PATIENT: Name: Madison Parker, Madison Parker Patient ID: [id number] Birth Date: 1951-08-18 Height: 62.0 in. Sex: Female Measured: 05/07/2019 Weight: 219.0  lbs. Indications: Bilateral Ovariectomy (65.51), Estrogen Deficient, Hysterectomy, Left hip replaced, Multiple Myloma, osteoarthiritis, Postmenopausal Fractures: None Treatments: Vitamin D (E933.5) ASSESSMENT: The BMD measured at Femur Total is 0.859 g/cm2 with a T-score of -1.2. This patient is considered osteopenic according to [facility]) criteria. The scan quality is good. Lumbar spine was not utilized due to surgical hardware. Left femur was excluded due to surgical hardware. Site Region Measured Date Measured Age [age]A BMD Significant CHANGE T-score Right Femur Total 05/07/2019 66.9 -1.3 0.859 g/cm2 Left Forearm Radius 33% 05/07/2019 66.9 -1.1 0.789 g/cm2 Right Femur Neck 05/07/2019 66.9 -0.7 0.948 g/cm2 World Health Organization [facility]) criteria for post-menopausal, Caucasian Women: Normal       T-score at or above -1 SD Osteopenia   T-score between -1 and -2.5 SD Osteoporosis T-score at or below -2.5 SD RECOMMENDATION: 1. All patients should optimize calcium and vitamin D intake. 2. Consider FDA approved medical therapies in postmenopausal women and men aged 76 years and older, based on  the following: a. A hip or vertebral (clinical or morphometric) fracture b. T- score < or = -2.5 at the femoral neck or spine after appropriate evaluation to exclude secondary causes c. Low bone mass (T-score between -1.0 and -2.5 at the femoral neck or spine) and a 10 year probability of a hip fracture > or = 3% or a 10 year probability of a major osteoporosis-related fracture > or = 20% based on the [place]-adapted WHO algorithm d. Clinician judgment and/or patient preferences may indicate treatment for people with 10-year fracture probabilities above or below these levels FOLLOW-UP: People with diagnosed cases of osteoporosis or at high risk for fracture should have regular bone mineral density tests. For patients eligible for Medicare, routine testing is allowed once every 2 years. The testing frequency can be  increased to one year for patients who have rapidly progressing disease, those who are receiving or discontinuing medical therapy to restore bone mass, or have additional risk factors. I have reviewed this report and agree with the above findings. [facility] FRAX* 10-year Probability of Fracture Based on femoral neck BMD: Femur (Right) Major Osteoporotic Fracture: 3.1% Hip Fracture:                0.2% Population:                  [place] (Black) Risk Factors:                None *FRAX is a Materials engineer of the [employer] for Metabolic Bone Disease, a World Pharmacologist (WHO) [facility]. ASSESSMENT: The probability of a major osteoporotic fracture is 3.1 % within the next ten years. The probability of a hip fracture is 0.2 % within the next ten years. Electronically Signed   By: Dorise Bullion III M.D   On: 05/07/2019 09:52     Bone survey 11/08/2016 IMPRESSION: 1. Small lytic lesion in the right scapula. 2. Possible small lytic lesion in the left scapula. 3. Postoperative changes. 4. Significant degenerative changes in both hips, left greater than right. 5. No evidence for acute fracture   Electronically Signed   By: Nolon Nations M.D.   On: 11/08/2016 16:19   ASSESSMENT & PLAN:   68 y.o. very pleasant lady with history of   1) Multiple myeloma (concern for small lytic lesions in left and right scapulae) - (appears light chain producing) and Progressive anemia  This was apparently diagnosed as smoldering myeloma in 2011 by her hematologist in [place].  No renal failure/hypercalcemia.  Bone survey with concern for possible small lytic lesions in B/L scapulae. PET/CT scan on 03/12/2017 showed no concerning bone lesions.  Initial Bm Bx with 17% kappa restricted plasma cells consistent with plasma cell neoplasm.  No treatment so far.  03/11/19 BM Bx revealed involvement by 50% plasma cells; genetics are pending,  previous genetics from April 2018 BM Bx were standard risk  03/16/19 MRI Lumbar reveals several explanations for her lower back pain including L2-L3 stenosis, but reassuringly, there was not evidence of involvement by multiple myeloma  04/01/19 PET/CT which revealed no FDG evidence of active multiple myeloma within the skeleton. No evidence of lytic lesions within the skeleton or soft tissue Plasmacytoma.  05/07/2019 DXA scan revealed osteopenia, T-score of -1.2.  2) Stable Kappa Lambda SFLC with increased K/L ratio - gradual increase  3) Chronic leukopenia - ? Related to SMM vs other nutritional deficiencies (h/o gastric bypass surgery put her at risk  for nutritional deficiencies)  B12 levels WNL  Ferritin adequate  ?additional factor -recent NSAID use.  ?element of benign ethnic neutropenia.  Has not had any issues with frequent infection.   ANC post-operative normalized today at 1700  4) Neuropathy/radiculopathy -Continue follow up with orthopedics  PLAN:  -Discussed pt labwork today, 05/19/2019; PLTs have improved, HGB has improved -K/L light chain ratio improving. -The pt has no prohibitive toxicities from continuing C2D1 Carfilzomib and Dexamethasone at this time. She will be adding Revlimid with this cycle. Treatment orders placed and signed. -Continue Acyclovir -Continue Aspirin   -Plz add MD visit on C2D15 for Revlimid toxicity check -Plz schedule C3 of treatment as per orders. Labs on D1,8 and 15. MD visit on C3D1   I discussed the assessment and treatment plan with the patient. The patient was provided an opportunity to ask questions and all were answered. The patient agreed with the plan and demonstrated an understanding of the instructions.   The patient was advised to call back or seek an in-person evaluation if the symptoms worsen or if the condition fails to improve as anticipated.  The total time spent in the appt was 25 minutes and more than 50% was on  counseling and direct patient cares.     Sullivan Lone MD [place] AAHIVMS [facility] Hematology/Oncology Physician [facility]  (Office):       [phone] (Work cell):  [phone] (Fax):           [phone]  I, De Burrs, am acting as a scribe for Dr. Irene Limbo  .I have reviewed the above documentation for accuracy and completeness, and I agree with the above. Brunetta Genera MD

## 2019-05-25 ENCOUNTER — Inpatient Hospital Stay: Payer: Medicare Other | Attending: Hematology

## 2019-05-25 ENCOUNTER — Inpatient Hospital Stay (HOSPITAL_BASED_OUTPATIENT_CLINIC_OR_DEPARTMENT_OTHER): Payer: Medicare Other | Admitting: Hematology

## 2019-05-25 ENCOUNTER — Inpatient Hospital Stay: Payer: Medicare Other

## 2019-05-25 ENCOUNTER — Other Ambulatory Visit: Payer: Self-pay

## 2019-05-25 VITALS — HR 84

## 2019-05-25 VITALS — BP 130/79 | HR 103 | Temp 98.5°F | Resp 18 | Ht 63.0 in | Wt 209.4 lb

## 2019-05-25 DIAGNOSIS — D649 Anemia, unspecified: Secondary | ICD-10-CM | POA: Diagnosis not present

## 2019-05-25 DIAGNOSIS — C9000 Multiple myeloma not having achieved remission: Secondary | ICD-10-CM | POA: Diagnosis not present

## 2019-05-25 DIAGNOSIS — Z7189 Other specified counseling: Secondary | ICD-10-CM

## 2019-05-25 DIAGNOSIS — Z95828 Presence of other vascular implants and grafts: Secondary | ICD-10-CM

## 2019-05-25 DIAGNOSIS — R5383 Other fatigue: Secondary | ICD-10-CM | POA: Insufficient documentation

## 2019-05-25 DIAGNOSIS — Z5112 Encounter for antineoplastic immunotherapy: Secondary | ICD-10-CM | POA: Insufficient documentation

## 2019-05-25 DIAGNOSIS — M8580 Other specified disorders of bone density and structure, unspecified site: Secondary | ICD-10-CM | POA: Insufficient documentation

## 2019-05-25 LAB — CBC WITH DIFFERENTIAL/PLATELET
Abs Immature Granulocytes: 0.01 10*3/uL (ref 0.00–0.07)
Basophils Absolute: 0 10*3/uL (ref 0.0–0.1)
Basophils Relative: 0 %
Eosinophils Absolute: 0 10*3/uL (ref 0.0–0.5)
Eosinophils Relative: 1 %
HCT: 32.3 % — ABNORMAL LOW (ref 36.0–46.0)
Hemoglobin: 10.3 g/dL — ABNORMAL LOW (ref 12.0–15.0)
Immature Granulocytes: 0 %
Lymphocytes Relative: 43 %
Lymphs Abs: 1.6 10*3/uL (ref 0.7–4.0)
MCH: 31.9 pg (ref 26.0–34.0)
MCHC: 31.9 g/dL (ref 30.0–36.0)
MCV: 100 fL (ref 80.0–100.0)
Monocytes Absolute: 0.3 10*3/uL (ref 0.1–1.0)
Monocytes Relative: 7 %
Neutro Abs: 1.8 10*3/uL (ref 1.7–7.7)
Neutrophils Relative %: 49 %
Platelets: 242 10*3/uL (ref 150–400)
RBC: 3.23 MIL/uL — ABNORMAL LOW (ref 3.87–5.11)
RDW: 15.8 % — ABNORMAL HIGH (ref 11.5–15.5)
WBC: 3.7 10*3/uL — ABNORMAL LOW (ref 4.0–10.5)
nRBC: 0.8 % — ABNORMAL HIGH (ref 0.0–0.2)

## 2019-05-25 LAB — CMP (CANCER CENTER ONLY)
ALT: 35 U/L (ref 0–44)
AST: 22 U/L (ref 15–41)
Albumin: 4 g/dL (ref 3.5–5.0)
Alkaline Phosphatase: 99 U/L (ref 38–126)
Anion gap: 12 (ref 5–15)
BUN: 14 mg/dL (ref 8–23)
CO2: 19 mmol/L — ABNORMAL LOW (ref 22–32)
Calcium: 9.1 mg/dL (ref 8.9–10.3)
Chloride: 110 mmol/L (ref 98–111)
Creatinine: 1.06 mg/dL — ABNORMAL HIGH (ref 0.44–1.00)
GFR, Est AFR Am: >60 mL/min (ref >60)
GFR, Estimated: 55 mL/min — ABNORMAL LOW (ref >60)
Glucose, Bld: 110 mg/dL — ABNORMAL HIGH (ref 70–99)
Potassium: 3.7 mmol/L (ref 3.5–5.1)
Sodium: 141 mmol/L (ref 135–145)
Total Bilirubin: 0.9 mg/dL (ref 0.3–1.2)
Total Protein: 6.8 g/dL (ref 6.5–8.1)

## 2019-05-25 MED ORDER — DEXTROSE 5 % IV SOLN
29.0000 mg/m2 | Freq: Once | INTRAVENOUS | Status: AC
  Administered 2019-05-25: 16:00:00 60 mg via INTRAVENOUS
  Filled 2019-05-25: qty 30

## 2019-05-25 MED ORDER — SODIUM CHLORIDE 0.9 % IV SOLN
20.0000 mg | Freq: Once | INTRAVENOUS | Status: AC
  Administered 2019-05-25: 15:00:00 20 mg via INTRAVENOUS
  Filled 2019-05-25: qty 20

## 2019-05-25 MED ORDER — SODIUM CHLORIDE 0.9 % IV SOLN
Freq: Once | INTRAVENOUS | Status: AC
  Administered 2019-05-25: 14:00:00 via INTRAVENOUS
  Filled 2019-05-25: qty 250

## 2019-05-25 MED ORDER — ACETAMINOPHEN 500 MG PO TABS
ORAL_TABLET | ORAL | Status: AC
  Filled 2019-05-25: qty 2

## 2019-05-25 MED ORDER — PROCHLORPERAZINE MALEATE 10 MG PO TABS
10.0000 mg | ORAL_TABLET | Freq: Once | ORAL | Status: AC
  Administered 2019-05-25: 10 mg via ORAL

## 2019-05-25 MED ORDER — SODIUM CHLORIDE 0.9% FLUSH
10.0000 mL | Freq: Once | INTRAVENOUS | Status: AC
  Administered 2019-05-25: 10 mL
  Filled 2019-05-25: qty 10

## 2019-05-25 MED ORDER — ACETAMINOPHEN 500 MG PO TABS
1000.0000 mg | ORAL_TABLET | Freq: Once | ORAL | Status: AC
  Administered 2019-05-25: 1000 mg via ORAL

## 2019-05-25 MED ORDER — HEPARIN SOD (PORK) LOCK FLUSH 100 UNIT/ML IV SOLN
500.0000 [IU] | Freq: Once | INTRAVENOUS | Status: AC | PRN
  Administered 2019-05-25: 500 [IU]
  Filled 2019-05-25: qty 5

## 2019-05-25 MED ORDER — PROCHLORPERAZINE MALEATE 10 MG PO TABS
ORAL_TABLET | ORAL | Status: AC
  Filled 2019-05-25: qty 1

## 2019-05-25 MED ORDER — SODIUM CHLORIDE 0.9% FLUSH
10.0000 mL | INTRAVENOUS | Status: DC | PRN
  Administered 2019-05-25: 10 mL
  Filled 2019-05-25: qty 10

## 2019-05-25 NOTE — Patient Instructions (Signed)
[place] Discharge Instructions for Patients Receiving Chemotherapy  Today you received the following chemotherapy agents Kyprolis  To help prevent nausea and vomiting after your treatment, we encourage you to take your nausea medication as directed by your MD.   If you develop nausea and vomiting that is not controlled by your nausea medication, call the clinic.   BELOW ARE SYMPTOMS THAT SHOULD BE REPORTED IMMEDIATELY:  *FEVER GREATER THAN 100.5 F  *CHILLS WITH OR WITHOUT FEVER  NAUSEA AND VOMITING THAT IS NOT CONTROLLED WITH YOUR NAUSEA MEDICATION  *UNUSUAL SHORTNESS OF BREATH  *UNUSUAL BRUISING OR BLEEDING  TENDERNESS IN MOUTH AND THROAT WITH OR WITHOUT PRESENCE OF ULCERS  *URINARY PROBLEMS  *BOWEL PROBLEMS  UNUSUAL RASH Items with * indicate a potential emergency and should be followed up as soon as possible.  Feel free to call the clinic should you have any questions or concerns. The clinic phone number is [phone].  Please show the [place] at check-in to the Emergency Department and triage nurse.  Coronavirus (COVID-19) Are you at risk?  Are you at risk for the Coronavirus (COVID-19)?  To be considered HIGH RISK for Coronavirus (COVID-19), you have to meet the following criteria:  . Traveled to [place], [place], [place], [place] or [place]; or in the [place] to [place], [place], [place], or [place]; and have fever, cough, and shortness of breath within the last 2 weeks of travel OR . Been in close contact with a person diagnosed with COVID-19 within the last 2 weeks and have fever, cough, and shortness of breath . IF YOU DO NOT MEET THESE CRITERIA, YOU ARE CONSIDERED LOW RISK FOR COVID-19.  What to do if you are HIGH RISK for COVID-19?  Marland Kitchen If you are having a medical emergency, call 911. . Seek medical care right away. Before you go to a doctor's office, urgent care or emergency department, call ahead and tell them about  your recent travel, contact with someone diagnosed with COVID-19, and your symptoms. You should receive instructions from your physician's office regarding next steps of care.  . When you arrive at healthcare provider, tell the healthcare staff immediately you have returned from visiting [place], [place], [place], [place] or [place]; or traveled in the [place] to [place], [place], [place], or [place]; in the last two weeks or you have been in close contact with a person diagnosed with COVID-19 in the last 2 weeks.   . Tell the health care staff about your symptoms: fever, cough and shortness of breath. . After you have been seen by a medical provider, you will be either: o Tested for (COVID-19) and discharged home on quarantine except to seek medical care if symptoms worsen, and asked to  - Stay home and avoid contact with others until you get your results (4-5 days)  - Avoid travel on public transportation if possible (such as bus, train, or airplane) or o Sent to the Emergency Department by EMS for evaluation, COVID-19 testing, and possible admission depending on your condition and test results.  What to do if you are LOW RISK for COVID-19?  Reduce your risk of any infection by using the same precautions used for avoiding the common cold or flu:  Marland Kitchen Wash your hands often with soap and warm water for at least 20 seconds.  If soap and water are not readily available, use an alcohol-based hand sanitizer with at least 60% alcohol.  . If  coughing or sneezing, cover your mouth and nose by coughing or sneezing into the elbow areas of your shirt or coat, into a tissue or into your sleeve (not your hands). . Avoid shaking hands with others and consider head nods or verbal greetings only. . Avoid touching your eyes, nose, or mouth with unwashed hands.  . Avoid close contact with people who are sick. . Avoid places or events with large numbers of people in one location, like concerts or sporting  events. . Carefully consider travel plans you have or are making. . If you are planning any travel outside or inside the [place], visit the CDC's Travelers' Health webpage for the latest health notices. . If you have some symptoms but not all symptoms, continue to monitor at home and seek medical attention if your symptoms worsen. . If you are having a medical emergency, call 911.   [place] / e-Visit: [email or web address]         [facility]: [facility]: [phone]                   [facility]: [phone]

## 2019-05-26 ENCOUNTER — Other Ambulatory Visit: Payer: Self-pay

## 2019-05-26 ENCOUNTER — Inpatient Hospital Stay: Payer: Medicare Other

## 2019-05-26 ENCOUNTER — Telehealth: Payer: Self-pay | Admitting: Hematology

## 2019-05-26 ENCOUNTER — Other Ambulatory Visit: Payer: Self-pay | Admitting: *Deleted

## 2019-05-26 VITALS — BP 121/73 | HR 77 | Temp 98.1°F | Resp 16

## 2019-05-26 DIAGNOSIS — Z5112 Encounter for antineoplastic immunotherapy: Secondary | ICD-10-CM | POA: Diagnosis not present

## 2019-05-26 DIAGNOSIS — C9000 Multiple myeloma not having achieved remission: Secondary | ICD-10-CM

## 2019-05-26 DIAGNOSIS — M8580 Other specified disorders of bone density and structure, unspecified site: Secondary | ICD-10-CM | POA: Diagnosis not present

## 2019-05-26 DIAGNOSIS — R5383 Other fatigue: Secondary | ICD-10-CM | POA: Diagnosis not present

## 2019-05-26 DIAGNOSIS — Z7189 Other specified counseling: Secondary | ICD-10-CM

## 2019-05-26 LAB — KAPPA/LAMBDA LIGHT CHAINS
Kappa free light chain: 233.2 mg/L — ABNORMAL HIGH (ref 3.3–19.4)
Kappa, lambda light chain ratio: 25.63 — ABNORMAL HIGH (ref 0.26–1.65)
Lambda free light chains: 9.1 mg/L (ref 5.7–26.3)

## 2019-05-26 LAB — MULTIPLE MYELOMA PANEL, SERUM
Albumin SerPl Elph-Mcnc: 3.7 g/dL (ref 2.9–4.4)
Albumin/Glob SerPl: 1.5 (ref 0.7–1.7)
Alpha 1: 0.2 g/dL (ref 0.0–0.4)
Alpha2 Glob SerPl Elph-Mcnc: 0.8 g/dL (ref 0.4–1.0)
B-Globulin SerPl Elph-Mcnc: 0.9 g/dL (ref 0.7–1.3)
Gamma Glob SerPl Elph-Mcnc: 0.6 g/dL (ref 0.4–1.8)
Globulin, Total: 2.5 g/dL (ref 2.2–3.9)
IgA: 33 mg/dL — ABNORMAL LOW (ref 87–352)
IgG (Immunoglobin G), Serum: 741 mg/dL (ref 586–1602)
IgM (Immunoglobulin M), Srm: 31 mg/dL (ref 26–217)
M Protein SerPl Elph-Mcnc: 0.2 g/dL — ABNORMAL HIGH
Total Protein ELP: 6.2 g/dL (ref 6.0–8.5)

## 2019-05-26 MED ORDER — PROCHLORPERAZINE MALEATE 10 MG PO TABS
10.0000 mg | ORAL_TABLET | Freq: Once | ORAL | Status: AC
  Administered 2019-05-26: 12:00:00 10 mg via ORAL

## 2019-05-26 MED ORDER — ACETAMINOPHEN 500 MG PO TABS
1000.0000 mg | ORAL_TABLET | Freq: Once | ORAL | Status: AC
  Administered 2019-05-26: 1000 mg via ORAL

## 2019-05-26 MED ORDER — DEXTROSE 5 % IV SOLN
29.0000 mg/m2 | Freq: Once | INTRAVENOUS | Status: AC
  Administered 2019-05-26: 13:00:00 60 mg via INTRAVENOUS
  Filled 2019-05-26: qty 30

## 2019-05-26 MED ORDER — SODIUM CHLORIDE 0.9 % IV SOLN
Freq: Once | INTRAVENOUS | Status: AC
  Administered 2019-05-26: 12:00:00 via INTRAVENOUS
  Filled 2019-05-26: qty 250

## 2019-05-26 MED ORDER — ERGOCALCIFEROL 1.25 MG (50000 UT) PO CAPS: 50000.0000 [IU] | ORAL_CAPSULE | ORAL | 0 refills | Status: DC

## 2019-05-26 MED ORDER — PROCHLORPERAZINE MALEATE 10 MG PO TABS
ORAL_TABLET | ORAL | Status: AC
  Filled 2019-05-26: qty 1

## 2019-05-26 MED ORDER — HEPARIN SOD (PORK) LOCK FLUSH 100 UNIT/ML IV SOLN
500.0000 [IU] | Freq: Once | INTRAVENOUS | Status: AC | PRN
  Administered 2019-05-26: 500 [IU]
  Filled 2019-05-26: qty 5

## 2019-05-26 MED ORDER — ACETAMINOPHEN 500 MG PO TABS
ORAL_TABLET | ORAL | Status: AC
  Filled 2019-05-26: qty 2

## 2019-05-26 MED ORDER — LIDOCAINE-PRILOCAINE 2.5-2.5 % EX CREA: 1.0000 "application " | TOPICAL_CREAM | CUTANEOUS | 2 refills | Status: DC | PRN

## 2019-05-26 MED ORDER — SODIUM CHLORIDE 0.9 % IV SOLN
20.0000 mg | Freq: Once | INTRAVENOUS | Status: AC
  Administered 2019-05-26: 20 mg via INTRAVENOUS
  Filled 2019-05-26: qty 20

## 2019-05-26 MED ORDER — ACYCLOVIR 400 MG PO TABS: 400.0000 mg | ORAL_TABLET | Freq: Two times a day (BID) | ORAL | 0 refills | Status: DC

## 2019-05-26 MED ORDER — SODIUM CHLORIDE 0.9% FLUSH
10.0000 mL | INTRAVENOUS | Status: DC | PRN
  Administered 2019-05-26: 10 mL
  Filled 2019-05-26: qty 10

## 2019-05-26 MED ORDER — SODIUM CHLORIDE 0.9 % IV SOLN
Freq: Once | INTRAVENOUS | Status: AC
  Administered 2019-05-26: 13:00:00 via INTRAVENOUS
  Filled 2019-05-26: qty 250

## 2019-05-26 NOTE — Patient Instructions (Signed)
[facility] Discharge Instructions for Patients Receiving Chemotherapy  Today you received the following chemotherapy agents: Carfilzomib (Kyprolis)  To help prevent nausea and vomiting after your treatment, we encourage you to take your nausea medication as directed.    If you develop nausea and vomiting that is not controlled by your nausea medication, call the clinic.   BELOW ARE SYMPTOMS THAT SHOULD BE REPORTED IMMEDIATELY:  *FEVER GREATER THAN 100.5 F  *CHILLS WITH OR WITHOUT FEVER  NAUSEA AND VOMITING THAT IS NOT CONTROLLED WITH YOUR NAUSEA MEDICATION  *UNUSUAL SHORTNESS OF BREATH  *UNUSUAL BRUISING OR BLEEDING  TENDERNESS IN MOUTH AND THROAT WITH OR WITHOUT PRESENCE OF ULCERS  *URINARY PROBLEMS  *BOWEL PROBLEMS  UNUSUAL RASH Items with * indicate a potential emergency and should be followed up as soon as possible.  Feel free to call the clinic should you have any questions or concerns. The clinic phone number is [phone].  Please show the CHEMO ALERT CARD at check-in to the Emergency Department and triage nurse.  Coronavirus (COVID-19) Are you at risk?  Are you at risk for the Coronavirus (COVID-19)?  To be considered HIGH RISK for Coronavirus (COVID-19), you have to meet the following criteria:  . Traveled to [place], [place], [place], [place] or [place]; or in the [place] to [place], [place], [place], or [place]; and have fever, cough, and shortness of breath within the last 2 weeks of travel OR . Been in close contact with a person diagnosed with COVID-19 within the last 2 weeks and have fever, cough, and shortness of breath . IF YOU DO NOT MEET THESE CRITERIA, YOU ARE CONSIDERED LOW RISK FOR COVID-19.  What to do if you are HIGH RISK for COVID-19?  . If you are having a medical emergency, call 911. . Seek medical care right away. Before you go to a doctor's office, urgent care or emergency department, call ahead and tell them  about your recent travel, contact with someone diagnosed with COVID-19, and your symptoms. You should receive instructions from your physician's office regarding next steps of care.  . When you arrive at healthcare provider, tell the healthcare staff immediately you have returned from visiting [place], [place], [place], [place] or [place]; or traveled in the [place] to [place], [place], [place], or [place]; in the last two weeks or you have been in close contact with a person diagnosed with COVID-19 in the last 2 weeks.   . Tell the health care staff about your symptoms: fever, cough and shortness of breath. . After you have been seen by a medical provider, you will be either: o Tested for (COVID-19) and discharged home on quarantine except to seek medical care if symptoms worsen, and asked to  - Stay home and avoid contact with others until you get your results (4-5 days)  - Avoid travel on public transportation if possible (such as bus, train, or airplane) or o Sent to the Emergency Department by EMS for evaluation, COVID-19 testing, and possible admission depending on your condition and test results.  What to do if you are LOW RISK for COVID-19?  Reduce your risk of any infection by using the same precautions used for avoiding the common cold or flu:  . Wash your hands often with soap and warm water for at least 20 seconds.  If soap and water are not readily available, use an alcohol-based hand sanitizer with at least 60% alcohol.  . If coughing   or sneezing, cover your mouth and nose by coughing or sneezing into the elbow areas of your shirt or coat, into a tissue or into your sleeve (not your hands). . Avoid shaking hands with others and consider head nods or verbal greetings only. . Avoid touching your eyes, nose, or mouth with unwashed hands.  . Avoid close contact with people who are sick. . Avoid places or events with large numbers of people in one location, like concerts or  sporting events. . Carefully consider travel plans you have or are making. . If you are planning any travel outside or inside the [place], visit the CDC's Travelers' Health webpage for the latest health notices. . If you have some symptoms but not all symptoms, continue to monitor at home and seek medical attention if your symptoms worsen. . If you are having a medical emergency, call 911.   ADDITIONAL HEALTHCARE OPTIONS FOR PATIENTS  [facility] / e-Visit: [email or web address]         [facility]: [phone]  [facility]: [phone]                   [facility]: [phone]   

## 2019-05-26 NOTE — Telephone Encounter (Signed)
Per Dr. Irene Limbo  - ok to fill Vit D, EMLA creme and Acyclovir with [facility] with 90 day supply

## 2019-05-26 NOTE — Telephone Encounter (Signed)
I talk with patient regarding schedule  

## 2019-05-27 NOTE — Telephone Encounter (Signed)
Oral Oncology Patient Advocate Encounter  I confirmed with Madison [facility] that she received the Revlimid 8/8 and started taking it on 8/11 per Dr. Pollie Meyer instruction. Her copay was $29.00.  [facility] Phone [phone] Fax [phone] 05/27/2019   1:07 PM

## 2019-06-01 ENCOUNTER — Inpatient Hospital Stay: Payer: Medicare Other

## 2019-06-01 ENCOUNTER — Other Ambulatory Visit: Payer: Self-pay

## 2019-06-01 VITALS — BP 130/72 | HR 74 | Temp 98.5°F | Resp 20

## 2019-06-01 DIAGNOSIS — Z5112 Encounter for antineoplastic immunotherapy: Secondary | ICD-10-CM | POA: Diagnosis not present

## 2019-06-01 DIAGNOSIS — C9000 Multiple myeloma not having achieved remission: Secondary | ICD-10-CM

## 2019-06-01 DIAGNOSIS — R5383 Other fatigue: Secondary | ICD-10-CM | POA: Diagnosis not present

## 2019-06-01 DIAGNOSIS — M8580 Other specified disorders of bone density and structure, unspecified site: Secondary | ICD-10-CM | POA: Diagnosis not present

## 2019-06-01 DIAGNOSIS — Z7189 Other specified counseling: Secondary | ICD-10-CM

## 2019-06-01 DIAGNOSIS — Z95828 Presence of other vascular implants and grafts: Secondary | ICD-10-CM

## 2019-06-01 LAB — CMP (CANCER CENTER ONLY)
ALT: 34 U/L (ref 0–44)
AST: 23 U/L (ref 15–41)
Albumin: 3.7 g/dL (ref 3.5–5.0)
Alkaline Phosphatase: 87 U/L (ref 38–126)
Anion gap: 8 (ref 5–15)
BUN: 9 mg/dL (ref 8–23)
CO2: 22 mmol/L (ref 22–32)
Calcium: 8.7 mg/dL — ABNORMAL LOW (ref 8.9–10.3)
Chloride: 110 mmol/L (ref 98–111)
Creatinine: 0.78 mg/dL (ref 0.44–1.00)
GFR, Est AFR Am: >60 mL/min (ref >60)
GFR, Estimated: >60 mL/min (ref >60)
Glucose, Bld: 89 mg/dL (ref 70–99)
Potassium: 3.7 mmol/L (ref 3.5–5.1)
Sodium: 140 mmol/L (ref 135–145)
Total Bilirubin: 0.9 mg/dL (ref 0.3–1.2)
Total Protein: 6.3 g/dL — ABNORMAL LOW (ref 6.5–8.1)

## 2019-06-01 LAB — CBC WITH DIFFERENTIAL/PLATELET
Abs Immature Granulocytes: 0.02 10*3/uL (ref 0.00–0.07)
Basophils Absolute: 0 10*3/uL (ref 0.0–0.1)
Basophils Relative: 0 %
Eosinophils Absolute: 0.1 10*3/uL (ref 0.0–0.5)
Eosinophils Relative: 2 %
HCT: 30.8 % — ABNORMAL LOW (ref 36.0–46.0)
Hemoglobin: 10.1 g/dL — ABNORMAL LOW (ref 12.0–15.0)
Immature Granulocytes: 1 %
Lymphocytes Relative: 43 %
Lymphs Abs: 1.3 10*3/uL (ref 0.7–4.0)
MCH: 32.2 pg (ref 26.0–34.0)
MCHC: 32.8 g/dL (ref 30.0–36.0)
MCV: 98.1 fL (ref 80.0–100.0)
Monocytes Absolute: 0.3 10*3/uL (ref 0.1–1.0)
Monocytes Relative: 8 %
Neutro Abs: 1.4 10*3/uL — ABNORMAL LOW (ref 1.7–7.7)
Neutrophils Relative %: 46 %
Platelets: 134 10*3/uL — ABNORMAL LOW (ref 150–400)
RBC: 3.14 MIL/uL — ABNORMAL LOW (ref 3.87–5.11)
RDW: 15.6 % — ABNORMAL HIGH (ref 11.5–15.5)
WBC: 3 10*3/uL — ABNORMAL LOW (ref 4.0–10.5)
nRBC: 1 % — ABNORMAL HIGH (ref 0.0–0.2)

## 2019-06-01 MED ORDER — DEXTROSE 5 % IV SOLN
29.0000 mg/m2 | Freq: Once | INTRAVENOUS | Status: AC
  Administered 2019-06-01: 60 mg via INTRAVENOUS
  Filled 2019-06-01: qty 30

## 2019-06-01 MED ORDER — ACETAMINOPHEN 500 MG PO TABS
ORAL_TABLET | ORAL | Status: AC
  Filled 2019-06-01: qty 2

## 2019-06-01 MED ORDER — SODIUM CHLORIDE 0.9% FLUSH
10.0000 mL | Freq: Once | INTRAVENOUS | Status: AC
  Administered 2019-06-01: 10 mL
  Filled 2019-06-01: qty 10

## 2019-06-01 MED ORDER — HEPARIN SOD (PORK) LOCK FLUSH 100 UNIT/ML IV SOLN
500.0000 [IU] | Freq: Once | INTRAVENOUS | Status: DC
  Filled 2019-06-01: qty 5

## 2019-06-01 MED ORDER — SODIUM CHLORIDE 0.9% FLUSH
10.0000 mL | INTRAVENOUS | Status: DC | PRN
  Administered 2019-06-01: 10 mL
  Filled 2019-06-01: qty 10

## 2019-06-01 MED ORDER — ACETAMINOPHEN 500 MG PO TABS
1000.0000 mg | ORAL_TABLET | Freq: Once | ORAL | Status: AC
  Administered 2019-06-01: 1000 mg via ORAL

## 2019-06-01 MED ORDER — PROCHLORPERAZINE MALEATE 10 MG PO TABS
ORAL_TABLET | ORAL | Status: AC
  Filled 2019-06-01: qty 1

## 2019-06-01 MED ORDER — PROCHLORPERAZINE MALEATE 10 MG PO TABS
10.0000 mg | ORAL_TABLET | Freq: Once | ORAL | Status: AC
  Administered 2019-06-01: 13:00:00 10 mg via ORAL

## 2019-06-01 MED ORDER — HEPARIN SOD (PORK) LOCK FLUSH 100 UNIT/ML IV SOLN
500.0000 [IU] | Freq: Once | INTRAVENOUS | Status: AC | PRN
  Administered 2019-06-01: 16:00:00 500 [IU]
  Filled 2019-06-01: qty 5

## 2019-06-01 MED ORDER — SODIUM CHLORIDE 0.9 % IV SOLN
Freq: Once | INTRAVENOUS | Status: AC
  Administered 2019-06-01: 13:00:00 via INTRAVENOUS
  Filled 2019-06-01: qty 250

## 2019-06-01 MED ORDER — ACETAMINOPHEN 325 MG PO TABS
ORAL_TABLET | ORAL | Status: AC
  Filled 2019-06-01: qty 2

## 2019-06-01 MED ORDER — SODIUM CHLORIDE 0.9 % IV SOLN
20.0000 mg | Freq: Once | INTRAVENOUS | Status: AC
  Administered 2019-06-01: 20 mg via INTRAVENOUS
  Filled 2019-06-01: qty 2

## 2019-06-01 NOTE — Patient Instructions (Signed)
[facility] Discharge Instructions for Patients Receiving Chemotherapy  Today you received the following chemotherapy agents:  Kyprolis  To help prevent nausea and vomiting after your treatment, we encourage you to take your nausea medication as directed.   If you develop nausea and vomiting that is not controlled by your nausea medication, call the clinic.   BELOW ARE SYMPTOMS THAT SHOULD BE REPORTED IMMEDIATELY:  *FEVER GREATER THAN 100.5 F  *CHILLS WITH OR WITHOUT FEVER  NAUSEA AND VOMITING THAT IS NOT CONTROLLED WITH YOUR NAUSEA MEDICATION  *UNUSUAL SHORTNESS OF BREATH  *UNUSUAL BRUISING OR BLEEDING  TENDERNESS IN MOUTH AND THROAT WITH OR WITHOUT PRESENCE OF ULCERS  *URINARY PROBLEMS  *BOWEL PROBLEMS  UNUSUAL RASH Items with * indicate a potential emergency and should be followed up as soon as possible.  Feel free to call the clinic should you have any questions or concerns. The clinic phone number is [phone].  Please show the CHEMO ALERT CARD at check-in to the Emergency Department and triage nurse.   

## 2019-06-01 NOTE — Progress Notes (Signed)
Verbal order per Dr. Irene Limbo - OK to treat today w/ANC 1.4

## 2019-06-02 ENCOUNTER — Inpatient Hospital Stay: Payer: Medicare Other

## 2019-06-02 ENCOUNTER — Other Ambulatory Visit: Payer: Self-pay

## 2019-06-02 ENCOUNTER — Other Ambulatory Visit: Payer: Self-pay | Admitting: *Deleted

## 2019-06-02 VITALS — BP 111/64 | HR 69 | Temp 97.8°F | Resp 16

## 2019-06-02 DIAGNOSIS — R5383 Other fatigue: Secondary | ICD-10-CM | POA: Diagnosis not present

## 2019-06-02 DIAGNOSIS — M8580 Other specified disorders of bone density and structure, unspecified site: Secondary | ICD-10-CM | POA: Diagnosis not present

## 2019-06-02 DIAGNOSIS — C9000 Multiple myeloma not having achieved remission: Secondary | ICD-10-CM

## 2019-06-02 DIAGNOSIS — Z5112 Encounter for antineoplastic immunotherapy: Secondary | ICD-10-CM | POA: Diagnosis not present

## 2019-06-02 DIAGNOSIS — Z7189 Other specified counseling: Secondary | ICD-10-CM

## 2019-06-02 MED ORDER — LENALIDOMIDE 15 MG PO CAPS: 15.0000 mg | ORAL_CAPSULE | Freq: Every day | ORAL | 0 refills | Status: DC

## 2019-06-02 MED ORDER — SODIUM CHLORIDE 0.9 % IV SOLN
20.0000 mg | Freq: Once | INTRAVENOUS | Status: AC
  Administered 2019-06-02: 20 mg via INTRAVENOUS
  Filled 2019-06-02: qty 20

## 2019-06-02 MED ORDER — SODIUM CHLORIDE 0.9% FLUSH
10.0000 mL | INTRAVENOUS | Status: DC | PRN
  Administered 2019-06-02: 14:00:00 10 mL
  Filled 2019-06-02: qty 10

## 2019-06-02 MED ORDER — SODIUM CHLORIDE 0.9 % IV SOLN
Freq: Once | INTRAVENOUS | Status: AC
  Administered 2019-06-02: 12:00:00 via INTRAVENOUS
  Filled 2019-06-02: qty 250

## 2019-06-02 MED ORDER — HEPARIN SOD (PORK) LOCK FLUSH 100 UNIT/ML IV SOLN
500.0000 [IU] | Freq: Once | INTRAVENOUS | Status: AC | PRN
  Administered 2019-06-02: 14:00:00 500 [IU]
  Filled 2019-06-02: qty 5

## 2019-06-02 MED ORDER — ACETAMINOPHEN 500 MG PO TABS
1000.0000 mg | ORAL_TABLET | Freq: Once | ORAL | Status: AC
  Administered 2019-06-02: 1000 mg via ORAL

## 2019-06-02 MED ORDER — ACETAMINOPHEN 500 MG PO TABS
ORAL_TABLET | ORAL | Status: AC
  Filled 2019-06-02: qty 2

## 2019-06-02 MED ORDER — DEXTROSE 5 % IV SOLN
29.0000 mg/m2 | Freq: Once | INTRAVENOUS | Status: AC
  Administered 2019-06-02: 60 mg via INTRAVENOUS
  Filled 2019-06-02: qty 30

## 2019-06-02 MED ORDER — PROCHLORPERAZINE MALEATE 10 MG PO TABS
ORAL_TABLET | ORAL | Status: AC
  Filled 2019-06-02: qty 1

## 2019-06-02 MED ORDER — PROCHLORPERAZINE MALEATE 10 MG PO TABS
10.0000 mg | ORAL_TABLET | Freq: Once | ORAL | Status: AC
  Administered 2019-06-02: 10 mg via ORAL

## 2019-06-02 NOTE — Telephone Encounter (Signed)
Refilled Revlimid per Dr. Irene Limbo OV note 05/25/2019 Refill sent to [facility], [place] # [id number], 06/02/2019

## 2019-06-02 NOTE — Patient Instructions (Signed)
[facility] Discharge Instructions for Patients Receiving Chemotherapy  Today you received the following chemotherapy agents:  Kyprolis  To help prevent nausea and vomiting after your treatment, we encourage you to take your nausea medication as directed.   If you develop nausea and vomiting that is not controlled by your nausea medication, call the clinic.   BELOW ARE SYMPTOMS THAT SHOULD BE REPORTED IMMEDIATELY:  *FEVER GREATER THAN 100.5 F  *CHILLS WITH OR WITHOUT FEVER  NAUSEA AND VOMITING THAT IS NOT CONTROLLED WITH YOUR NAUSEA MEDICATION  *UNUSUAL SHORTNESS OF BREATH  *UNUSUAL BRUISING OR BLEEDING  TENDERNESS IN MOUTH AND THROAT WITH OR WITHOUT PRESENCE OF ULCERS  *URINARY PROBLEMS  *BOWEL PROBLEMS  UNUSUAL RASH Items with * indicate a potential emergency and should be followed up as soon as possible.  Feel free to call the clinic should you have any questions or concerns. The clinic phone number is [phone].  Please show the CHEMO ALERT CARD at check-in to the Emergency Department and triage nurse.   

## 2019-06-08 ENCOUNTER — Inpatient Hospital Stay: Payer: Medicare Other

## 2019-06-08 ENCOUNTER — Other Ambulatory Visit: Payer: Self-pay

## 2019-06-08 VITALS — BP 102/63 | HR 67 | Temp 98.5°F | Resp 16 | Wt 216.2 lb

## 2019-06-08 DIAGNOSIS — R5383 Other fatigue: Secondary | ICD-10-CM | POA: Diagnosis not present

## 2019-06-08 DIAGNOSIS — C9000 Multiple myeloma not having achieved remission: Secondary | ICD-10-CM

## 2019-06-08 DIAGNOSIS — Z95828 Presence of other vascular implants and grafts: Secondary | ICD-10-CM

## 2019-06-08 DIAGNOSIS — M8580 Other specified disorders of bone density and structure, unspecified site: Secondary | ICD-10-CM | POA: Diagnosis not present

## 2019-06-08 DIAGNOSIS — Z7189 Other specified counseling: Secondary | ICD-10-CM

## 2019-06-08 DIAGNOSIS — Z5112 Encounter for antineoplastic immunotherapy: Secondary | ICD-10-CM | POA: Diagnosis not present

## 2019-06-08 LAB — CMP (CANCER CENTER ONLY)
ALT: 50 U/L — ABNORMAL HIGH (ref 0–44)
AST: 25 U/L (ref 15–41)
Albumin: 3.5 g/dL (ref 3.5–5.0)
Alkaline Phosphatase: 84 U/L (ref 38–126)
Anion gap: 8 (ref 5–15)
BUN: 10 mg/dL (ref 8–23)
CO2: 22 mmol/L (ref 22–32)
Calcium: 8.3 mg/dL — ABNORMAL LOW (ref 8.9–10.3)
Chloride: 111 mmol/L (ref 98–111)
Creatinine: 0.77 mg/dL (ref 0.44–1.00)
GFR, Est AFR Am: >60 mL/min (ref >60)
GFR, Estimated: >60 mL/min (ref >60)
Glucose, Bld: 88 mg/dL (ref 70–99)
Potassium: 3.8 mmol/L (ref 3.5–5.1)
Sodium: 141 mmol/L (ref 135–145)
Total Bilirubin: 1 mg/dL (ref 0.3–1.2)
Total Protein: 5.9 g/dL — ABNORMAL LOW (ref 6.5–8.1)

## 2019-06-08 LAB — CBC WITH DIFFERENTIAL/PLATELET
Abs Immature Granulocytes: 0.02 10*3/uL (ref 0.00–0.07)
Basophils Absolute: 0 10*3/uL (ref 0.0–0.1)
Basophils Relative: 0 %
Eosinophils Absolute: 0.1 10*3/uL (ref 0.0–0.5)
Eosinophils Relative: 3 %
HCT: 29.9 % — ABNORMAL LOW (ref 36.0–46.0)
Hemoglobin: 9.6 g/dL — ABNORMAL LOW (ref 12.0–15.0)
Immature Granulocytes: 1 %
Lymphocytes Relative: 42 %
Lymphs Abs: 1.5 10*3/uL (ref 0.7–4.0)
MCH: 32.1 pg (ref 26.0–34.0)
MCHC: 32.1 g/dL (ref 30.0–36.0)
MCV: 100 fL (ref 80.0–100.0)
Monocytes Absolute: 0.5 10*3/uL (ref 0.1–1.0)
Monocytes Relative: 13 %
Neutro Abs: 1.5 10*3/uL — ABNORMAL LOW (ref 1.7–7.7)
Neutrophils Relative %: 41 %
Platelets: 123 10*3/uL — ABNORMAL LOW (ref 150–400)
RBC: 2.99 MIL/uL — ABNORMAL LOW (ref 3.87–5.11)
RDW: 15.9 % — ABNORMAL HIGH (ref 11.5–15.5)
WBC: 3.7 10*3/uL — ABNORMAL LOW (ref 4.0–10.5)
nRBC: 0.8 % — ABNORMAL HIGH (ref 0.0–0.2)

## 2019-06-08 MED ORDER — DEXTROSE 5 % IV SOLN
29.0000 mg/m2 | Freq: Once | INTRAVENOUS | Status: AC
  Administered 2019-06-08: 15:00:00 60 mg via INTRAVENOUS
  Filled 2019-06-08: qty 30

## 2019-06-08 MED ORDER — SODIUM CHLORIDE 0.9 % IV SOLN
Freq: Once | INTRAVENOUS | Status: AC
  Administered 2019-06-08: 13:00:00 via INTRAVENOUS
  Filled 2019-06-08: qty 250

## 2019-06-08 MED ORDER — SODIUM CHLORIDE 0.9 % IV SOLN
20.0000 mg | Freq: Once | INTRAVENOUS | Status: AC
  Administered 2019-06-08: 20 mg via INTRAVENOUS
  Filled 2019-06-08: qty 20

## 2019-06-08 MED ORDER — SODIUM CHLORIDE 0.9% FLUSH
10.0000 mL | INTRAVENOUS | Status: DC | PRN
  Administered 2019-06-08: 10 mL
  Filled 2019-06-08: qty 10

## 2019-06-08 MED ORDER — ACETAMINOPHEN 500 MG PO TABS
1000.0000 mg | ORAL_TABLET | Freq: Once | ORAL | Status: AC
  Administered 2019-06-08: 1000 mg via ORAL

## 2019-06-08 MED ORDER — PROCHLORPERAZINE MALEATE 10 MG PO TABS
10.0000 mg | ORAL_TABLET | Freq: Once | ORAL | Status: AC
  Administered 2019-06-08: 10 mg via ORAL

## 2019-06-08 MED ORDER — SODIUM CHLORIDE 0.9 % IV SOLN
Freq: Once | INTRAVENOUS | Status: DC
  Filled 2019-06-08: qty 250

## 2019-06-08 MED ORDER — SODIUM CHLORIDE 0.9% FLUSH
10.0000 mL | Freq: Once | INTRAVENOUS | Status: AC
  Administered 2019-06-08: 10 mL
  Filled 2019-06-08: qty 10

## 2019-06-08 MED ORDER — PROCHLORPERAZINE MALEATE 10 MG PO TABS
ORAL_TABLET | ORAL | Status: AC
  Filled 2019-06-08: qty 1

## 2019-06-08 MED ORDER — ACETAMINOPHEN 500 MG PO TABS
ORAL_TABLET | ORAL | Status: AC
  Filled 2019-06-08: qty 2

## 2019-06-08 MED ORDER — HEPARIN SOD (PORK) LOCK FLUSH 100 UNIT/ML IV SOLN
500.0000 [IU] | Freq: Once | INTRAVENOUS | Status: AC | PRN
  Administered 2019-06-08: 500 [IU]
  Filled 2019-06-08: qty 5

## 2019-06-08 NOTE — Progress Notes (Signed)
Patient requested port be left accessed for tomorrow's infusion. Take home dressing and biopatch applied.

## 2019-06-08 NOTE — Patient Instructions (Signed)
[facility] Discharge Instructions for Patients Receiving Chemotherapy  Today you received the following chemotherapy agents: Carfilzomib (Kyprolis) and etoposide  To help prevent nausea and vomiting after your treatment, we encourage you to take your nausea medication as directed.   If you develop nausea and vomiting that is not controlled by your nausea medication, call the clinic.   BELOW ARE SYMPTOMS THAT SHOULD BE REPORTED IMMEDIATELY:  *FEVER GREATER THAN 100.5 F  *CHILLS WITH OR WITHOUT FEVER  NAUSEA AND VOMITING THAT IS NOT CONTROLLED WITH YOUR NAUSEA MEDICATION  *UNUSUAL SHORTNESS OF BREATH  *UNUSUAL BRUISING OR BLEEDING  TENDERNESS IN MOUTH AND THROAT WITH OR WITHOUT PRESENCE OF ULCERS  *URINARY PROBLEMS  *BOWEL PROBLEMS  UNUSUAL RASH Items with * indicate a potential emergency and should be followed up as soon as possible.  Feel free to call the clinic should you have any questions or concerns. The clinic phone number is [phone].  Please show the [facility] at check-in to the Emergency Department and triage nurse.  Coronavirus (COVID-19) Are you at risk?  Are you at risk for the Coronavirus (COVID-19)?  To be considered HIGH RISK for Coronavirus (COVID-19), you have to meet the following criteria:  . Traveled to [place], [place], [place], [place] or [place]; or in the [place] to [place], [place], [place], or [place]; and have fever, cough, and shortness of breath within the last 2 weeks of travel OR . Been in close contact with a person diagnosed with COVID-19 within the last 2 weeks and have fever, cough, and shortness of breath . IF YOU DO NOT MEET THESE CRITERIA, YOU ARE CONSIDERED LOW RISK FOR COVID-19.  What to do if you are HIGH RISK for COVID-19?  Marland Kitchen If you are having a medical emergency, call 911. . Seek medical care right away. Before you go to a doctor's office, urgent care or emergency department, call ahead  and tell them about your recent travel, contact with someone diagnosed with COVID-19, and your symptoms. You should receive instructions from your physician's office regarding next steps of care.  . When you arrive at healthcare provider, tell the healthcare staff immediately you have returned from visiting [place], [place], [place], [place] or [place]; or traveled in the [place] to [place], [place], [place], or [place]; in the last two weeks or you have been in close contact with a person diagnosed with COVID-19 in the last 2 weeks.   . Tell the health care staff about your symptoms: fever, cough and shortness of breath. . After you have been seen by a medical provider, you will be either: o Tested for (COVID-19) and discharged home on quarantine except to seek medical care if symptoms worsen, and asked to  - Stay home and avoid contact with others until you get your results (4-5 days)  - Avoid travel on public transportation if possible (such as bus, train, or airplane) or o Sent to the Emergency Department by EMS for evaluation, COVID-19 testing, and possible admission depending on your condition and test results.  What to do if you are LOW RISK for COVID-19?  Reduce your risk of any infection by using the same precautions used for avoiding the common cold or flu:  Marland Kitchen Wash your hands often with soap and warm water for at least 20 seconds.  If soap and water are not readily available, use an alcohol-based hand sanitizer with at least 60% alcohol.  . If  coughing or sneezing, cover your mouth and nose by coughing or sneezing into the elbow areas of your shirt or coat, into a tissue or into your sleeve (not your hands). . Avoid shaking hands with others and consider head nods or verbal greetings only. . Avoid touching your eyes, nose, or mouth with unwashed hands.  . Avoid close contact with people who are sick. . Avoid places or events with large numbers of people in one location, like  concerts or sporting events. . Carefully consider travel plans you have or are making. . If you are planning any travel outside or inside the [place], visit the CDC's Travelers' Health webpage for the latest health notices. . If you have some symptoms but not all symptoms, continue to monitor at home and seek medical attention if your symptoms worsen. . If you are having a medical emergency, call 911.   [facility] / e-Visit: [email or web address]         [facility]: [facility]: [phone]                   [facility]: [phone]

## 2019-06-08 NOTE — Progress Notes (Signed)
Marland Kitchen    HEMATOLOGY/ONCOLOGY CLINIC NOTE  Date of Service: 06/09/19     PCP: Marzetta Board MD  CHIEF COMPLAINTS/PURPOSE OF CONSULTATION:  Continued f/u for mx of myeloma   HISTORY OF PRESENTING ILLNESS:   Madison Parker is a wonderful 68 y.o. 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  Patient has a history of smoldering multiple myeloma which she reports she was diagnosed with in 2011 when she was evaluated by a hematologist in [place]. She reports that she presented with low blood counts (low WBC)   and after significant workup she had a bone marrow examination based on which she was told that she has smoldering multiple myeloma. Patient notes that she had a bone survey at that time which was negative. She was monitored there closely and then moved to [facility] in 2015 to stay with her son whose wife was being treated for breast cancer. Patient was following with Dr. Delight Hoh in [place]. She reports not having had any treatment for multiple myeloma.  Her last labs from about a year ago showed a SPEP with no OBSERVED M spike . Serum free light chains showed an elevation of kappa Free light chain 310 and lambda free light chain of 12.7 with an abnormal Kappa/ Lambda ratio of 24.38 ( up from 19 previously). Random urine showed an M protein complement of 44%.  She lives in [place] and requested transfer of care to [place]. She was offered follow-up but [facility] in [place]  But prefers to  follow [place] in [place]. Patient reports her energy levels been stable. She has chronic back pain which has improved since the patient had her spinal surgery in April 2017 (L4-L5 interbody fusion). Patient notes she has had some chronic left hip pain which is somewhat more bothersome with the navy on the lateral aspect of her upper thighs that's painful. She also notes some right hip pain.  Over the last few weeks he notes some upper neck pain especially when bending her neck backwards and tingling numbness in her right upper extremity.  Has been working on losing some weight voluntarily.  No acute other new symptoms.   INTERVAL HISTORY:   Madison Parker returns today for f/u for her smoldering multiple myeloma. The patient's last visit with [place] was on 05/25/2019. The pt reports that she is doing well overall.  The pt reports no problems taking Revlimid at this time. She has extreme fatigue immediately after chemotherapy. She had cramps in her calves last night. She has been eating well and had a couple of mouth sores went away quickly. Pt is drinking a lot water and has been being more active via cycling lately. She has been diligently taking her asprin and is trying to take her other supplements more regularly.   Lab results today (06/09/19) of CBC w/diff and CMP is as follows: all values are WNL except for RBC at 2.89, Hgb at 9.4, HCT at 28.5, RDW at 15.6, Platelets at 126K, nRBC at 1.1, Abs Immature granulocytes at 0.08K, CO2 at 21, Calcium at 8.6, Total Protein at 6.0, AST at 60, ALT at 100.   05/25/2019 K/L light chains all values are WNL except for Kappa free light chain at 233.2, Kappa lamda light chain ratio at 25.63.  On review of systems, pt reports mouth sores, fatigue, muscle cramping, SOB and denies fever, rashes, new bone pain, abdominal pain,  pain around her port and any other symptoms.   MEDICAL HISTORY:  Past Medical History:  Diagnosis Date  . Headache   . Low back pain   . SBO (small bowel obstruction) ([place]) 2010  . Smoldering multiple myeloma ([facility])   Previous history of hypothyroidism- not currently medications Anxiety Obesity .There is no height or weight on file to calculate BMI. Vitamin D deficiency Smoldering multiple myeloma diagnosed in 2011 Lumbosacral radiculopathy Left hip pain  SURGICAL HISTORY: Past Surgical History:  Procedure Laterality  Date  . ABDOMINAL HYSTERECTOMY     complete  . BACK SURGERY     lower at [facility]  . colonscopy  2014  . GASTRIC BYPASS  yrs ago  . HERNIA REPAIR    . IR IMAGING GUIDED PORT INSERTION  04/20/2019  . KNEE SURGERY  06/04/2009   both knees replaced   . TONSILLECTOMY  age 89  . TOTAL HIP ARTHROPLASTY Left 08/16/2017   Procedure: LEFT TOTAL HIP ARTHROPLASTY ANTERIOR APPROACH;  Surgeon: Dorna Leitz, MD;  Location: [facility];  Service: Orthopedics;  Laterality: Left;  Spinal surgery April 2017  SOCIAL HISTORY: Social History   Socioeconomic History  . Marital status: Married    Spouse name: Not on file  . Number of children: Not on file  . Years of education: Not on file  . Highest education level: Not on file  Occupational History  . Not on file  Social Needs  . Financial resource strain: Not on file  . Food insecurity    Worry: Not on file    Inability: Not on file  . Transportation needs    Medical: Not on file    Non-medical: Not on file  Tobacco Use  . Smoking status: Former Smoker    Packs/day: 0.50    Years: 10.00    Pack years: 5.00    Types: Cigarettes  . Smokeless tobacco: Never Used  . Tobacco comment: quit 1977  Substance and Sexual Activity  . Alcohol use: Yes    Comment: occ  . Drug use: No  . Sexual activity: Not on file  Lifestyle  . Physical activity    Days per week: Not on file    Minutes per session: Not on file  . Stress: Not on file  Relationships  . Social Herbalist on phone: Not on file    Gets together: Not on file    Attends religious service: Not on file    Active member of club or organization: Not on file    Attends meetings of clubs or organizations: Not on file    Relationship status: Not on file  . Intimate partner violence    Fear of current or ex partner: Not on file    Emotionally abused: Not on file    Physically abused: Not on file    Forced sexual activity: Not on file  Other Topics Concern  . Not on file  Social  History Narrative  . Not on file  Occasional alcohol use Former smoker and smoked 1 pack per week for about 9 years has since quit.  FAMILY HISTORY: No family history on file.  ALLERGIES:  is allergic to codeine.  MEDICATIONS:  Current Outpatient Medications  Medication Sig Dispense Refill  . acetaminophen (TYLENOL) 650 MG CR tablet Take 650 mg by mouth every 8 (eight) hours as needed for pain.    Marland Kitchen acyclovir (ZOVIRAX) 400 MG tablet Take 1 tablet (400 mg total) by mouth 2 (  two) times daily. 180 tablet 0  . ascorbic acid (VITAMIN C) 500 MG tablet Take 500 mg by mouth daily.    Marland Kitchen aspirin EC 81 MG tablet Take 81 mg by mouth daily.    . B Complex-C (B-COMPLEX WITH VITAMIN C) tablet Take 1 tablet by mouth daily.    . ergocalciferol (VITAMIN D2) 1.25 MG (50000 UT) capsule Take 1 capsule (50,000 Units total) by mouth once a week. 13 capsule 0  . lenalidomide (REVLIMID) 15 MG capsule Take 1 capsule (15 mg total) by mouth daily. Take for 21 d on, 7 d off, repeat every 28d 21 capsule 0  . lidocaine-prilocaine (EMLA) cream Apply 1 application topically as needed. 30 g 2  . MULTIPLE VITAMIN PO Take 1 tablet by mouth daily.    . ondansetron (ZOFRAN) 8 MG tablet Take 1 tablet (8 mg total) by mouth 2 (two) times daily as needed (Nausea or vomiting). 30 tablet 1  . Potassium 99 MG TABS Take 99 mg by mouth daily.    . prochlorperazine (COMPAZINE) 10 MG tablet Take 1 tablet (10 mg total) by mouth every 6 (six) hours as needed for nausea or vomiting. 30 tablet 0  . prochlorperazine (COMPAZINE) 10 MG tablet Take 1 tablet (10 mg total) by mouth every 6 (six) hours as needed (Nausea or vomiting). 30 tablet 1  . vitamin B-12 (CYANOCOBALAMIN) 1000 MCG tablet Take 5,000 mcg by mouth daily.      No current facility-administered medications for this visit.    Facility-Administered Medications Ordered in Other Visits  Medication Dose Route Frequency Provider Last Rate Last Dose  . sodium chloride flush (NS) 0.9  % injection 10 mL  10 mL Intracatheter PRN Brunetta Genera, MD   10 mL at 05/11/19 1437    REVIEW OF SYSTEMS:    A 10+ POINT REVIEW OF SYSTEMS WAS OBTAINED including neurology, dermatology, psychiatry, cardiac, respiratory, lymph, extremities, GI, GU, Musculoskeletal, constitutional, breasts, reproductive, HEENT.  All pertinent positives are noted in the HPI.  All others are negative.    PHYSICAL EXAMINATION: ECOG PERFORMANCE STATUS: 1 - Symptomatic but completely ambulatory  Vitals:   06/09/19 1151  BP: 139/71  Pulse: 92  Resp: 18  Temp: 97.6 F (36.4 C)  SpO2: 99%   Filed Weights   06/09/19 1151  Weight: 217 lb 6.4 oz (98.6 kg)   .Body mass index is 38.51 kg/m.    GENERAL:alert, in no acute distress and comfortable SKIN: no acute rashes, no significant lesions EYES: conjunctiva are pink and non-injected, sclera anicteric OROPHARYNX: MMM, no exudates, no oropharyngeal erythema or ulceration NECK: supple, no JVD LYMPH:  no palpable lymphadenopathy in the cervical, axillary or inguinal regions LUNGS: clear to auscultation b/l with normal respiratory effort HEART: regular rate & rhythm ABDOMEN:  normoactive bowel sounds , non tender, not distended. No palpable hepatosplenomegaly.  Extremity: no pedal edema PSYCH: alert & oriented x 3 with fluent speech NEURO: no focal motor/sensory deficits   LABORATORY DATA:  I have reviewed the data as listed  . CBC Latest Ref Rng & Units 06/09/2019 06/08/2019 06/01/2019  WBC 4.0 - 10.5 K/uL 7.6 3.7(L) 3.0(L)  Hemoglobin 12.0 - 15.0 g/dL 9.4(L) 9.6(L) 10.1(L)  Hematocrit 36.0 - 46.0 % 28.5(L) 29.9(L) 30.8(L)  Platelets 150 - 400 K/uL 126(L) 123(L) 134(L)  ANC 1300 . CBC    Component Value Date/Time   WBC 3.7 (L) 06/08/2019 1202   RBC 2.99 (L) 06/08/2019 1202   HGB 9.6 (L) 06/08/2019 1202  HGB 9.4 (L) 05/04/2019 1137   HGB 11.3 (L) 09/30/2017 1145   HCT 29.9 (L) 06/08/2019 1202   HCT 35.1 09/30/2017 1145   PLT 123 (L)  06/08/2019 1202   PLT 126 (L) 05/04/2019 1137   PLT 172 09/30/2017 1145   MCV 100.0 06/08/2019 1202   MCV 97.5 09/30/2017 1145   MCH 32.1 06/08/2019 1202   MCHC 32.1 06/08/2019 1202   RDW 15.9 (H) 06/08/2019 1202   RDW 14.0 09/30/2017 1145   LYMPHSABS 1.5 06/08/2019 1202   LYMPHSABS 1.3 09/30/2017 1145   MONOABS 0.5 06/08/2019 1202   MONOABS 0.2 09/30/2017 1145   EOSABS 0.1 06/08/2019 1202   EOSABS 0.0 09/30/2017 1145   EOSABS 0.1 06/03/2014 1003   BASOSABS 0.0 06/08/2019 1202   BASOSABS 0.0 09/30/2017 1145     . CMP Latest Ref Rng & Units 06/09/2019 06/08/2019 06/01/2019  Glucose 70 - 99 mg/dL 91 88 89  BUN 8 - 23 mg/dL 14 10 9   Creatinine 0.44 - 1.00 mg/dL 0.81 0.77 0.78  Sodium 135 - 145 mmol/L 140 141 140  Potassium 3.5 - 5.1 mmol/L 3.7 3.8 3.7  Chloride 98 - 111 mmol/L 110 111 110  CO2 22 - 32 mmol/L 21(L) 22 22  Calcium 8.9 - 10.3 mg/dL 8.6(L) 8.3(L) 8.7(L)  Total Protein 6.5 - 8.1 g/dL 6.0(L) 5.9(L) 6.3(L)  Total Bilirubin 0.3 - 1.2 mg/dL 1.1 1.0 0.9  Alkaline Phos 38 - 126 U/L 88 84 87  AST 15 - 41 U/L 60(H) 25 23  ALT 0 - 44 U/L 100(H) 50(H) 34        Component     Latest Ref Rng & Units 11/08/2016  LDH     125 - 245 U/L 220  Beta 2     0.6 - 2.4 mg/L 2.5 (H)   Component     Latest Ref Rng & Units 07/30/2017  Ferritin     9 - 269 ng/ml 81  Vitamin B12     232 - 1,245 pg/mL 594    03/11/19 BM Bx:   03/11/19 Cytogenetics:       RADIOGRAPHIC STUDIES: I have personally reviewed the radiological images as listed and agreed with the findings in the report. No results found.   Bone survey 11/08/2016 IMPRESSION: 1. Small lytic lesion in the right scapula. 2. Possible small lytic lesion in the left scapula. 3. Postoperative changes. 4. Significant degenerative changes in both hips, left greater than right. 5. No evidence for acute fracture   Electronically Signed   By: Nolon Nations M.D.   On: 11/08/2016 16:19   ASSESSMENT & PLAN:    68 y.o. very pleasant lady with history of   1) Multiple myeloma (concern for small lytic lesions in left and right scapulae) - (appears light chain producing) and Progressive anemia  This was apparently diagnosed as smoldering myeloma in 2011 by her hematologist in [place].  No renal failure/hypercalcemia.  Bone survey with concern for possible small lytic lesions in B/L scapulae. PET/CT scan on 03/12/2017 showed no concerning bone lesions.  Initial Bm Bx with 17% kappa restricted plasma cells consistent with plasma cell neoplasm.  No treatment so far.  03/11/19 BM Bx revealed involvement by 50% plasma cells; genetics are pending, previous genetics from April 2018 BM Bx were standard risk  03/16/19 MRI Lumbar reveals several explanations for her lower back pain including L2-L3 stenosis, but reassuringly, there was not evidence of involvement by multiple myeloma  04/01/19 PET/CT which  revealed no FDG evidence of active multiple myeloma within the skeleton. No evidence of lytic lesions within the skeleton or soft tissue Plasmacytoma.  05/07/2019 DXA scan revealed osteopenia, T-score of -1.2.  2) Stable Kappa Lambda SFLC with increased K/L ratio - gradual increase  3) Chronic leukopenia - ? Related to SMM vs other nutritional deficiencies (h/o gastric bypass surgery put her at risk for nutritional deficiencies)  B12 levels WNL  Ferritin adequate  ?additional factor -recent NSAID use.  ?element of benign ethnic neutropenia.  Has not had any issues with frequent infection.   ANC post-operative normalized today at 1700  4) Neuropathy/radiculopathy -Continue follow up with orthopedics  PLAN:   -Discussed pt labwork today, 06/09/19; all values are WNL except for RBC at 2.89, Hgb at 9.4, HCT at 28.5, RDW at 15.6, Platelets at 126K, nRBC at 1.1, Abs Immature granulocytes at 0.08K, CO2 at 21, Calcium at 8.6, Total Protein at 6.0, AST at 60, ALT at 100.  -Discussed 05/25/2019  K/L light chains all values are WNL except for Kappa free light chain at 233.2, Kappa lamda light chain ratio at 25.63. -Recommended that the pt continue to eat well, drink at least 48-64 oz of water each day, and walk 20-30 minutes each day.  -Discussed that if pt becomes more anemic from Tx she may need blood transfusions.  -Between 4-6 cycles, depending on labs we will repeat scans and re-evaluate Tx. Maintenance vs transplant. -The pt has no prohibitive toxicities from continuing C2D16 Carfilzomib, Dexamethasone, Revlimid at this time. -Treatment orders placed and signed. -Continue Acyclovir -Continue Aspirin  FOLLOW UP:  F/u as per scheduled appointments for cycle 3  The total time spent in the appt was 25 minutes and more than 50% was on counseling and direct patient cares.  All of the patient's questions were answered with apparent satisfaction. The patient knows to call the clinic with any problems, questions or concerns.   Sullivan Lone MD [place] AAHIVMS [facility] Hematology/Oncology Physician [facility]  (Office):       [phone] (Work cell):  [phone] (Fax):           [phone]  I, Yevette Edwards, am acting as a scribe for Dr. Sullivan Lone.   .I have reviewed the above documentation for accuracy and completeness, and I agree with the above. Brunetta Genera MD

## 2019-06-09 ENCOUNTER — Inpatient Hospital Stay (HOSPITAL_BASED_OUTPATIENT_CLINIC_OR_DEPARTMENT_OTHER): Payer: Medicare Other | Admitting: Hematology

## 2019-06-09 ENCOUNTER — Other Ambulatory Visit: Payer: Self-pay

## 2019-06-09 ENCOUNTER — Inpatient Hospital Stay: Payer: Medicare Other

## 2019-06-09 ENCOUNTER — Telehealth: Payer: Self-pay | Admitting: Hematology

## 2019-06-09 VITALS — BP 139/71 | HR 92 | Temp 97.6°F | Resp 18 | Ht 63.0 in | Wt 217.4 lb

## 2019-06-09 DIAGNOSIS — D649 Anemia, unspecified: Secondary | ICD-10-CM

## 2019-06-09 DIAGNOSIS — Z5111 Encounter for antineoplastic chemotherapy: Secondary | ICD-10-CM

## 2019-06-09 DIAGNOSIS — C9000 Multiple myeloma not having achieved remission: Secondary | ICD-10-CM | POA: Diagnosis not present

## 2019-06-09 DIAGNOSIS — M8580 Other specified disorders of bone density and structure, unspecified site: Secondary | ICD-10-CM | POA: Diagnosis not present

## 2019-06-09 DIAGNOSIS — Z95828 Presence of other vascular implants and grafts: Secondary | ICD-10-CM

## 2019-06-09 DIAGNOSIS — Z5112 Encounter for antineoplastic immunotherapy: Secondary | ICD-10-CM | POA: Diagnosis not present

## 2019-06-09 DIAGNOSIS — R5383 Other fatigue: Secondary | ICD-10-CM | POA: Diagnosis not present

## 2019-06-09 DIAGNOSIS — Z7189 Other specified counseling: Secondary | ICD-10-CM

## 2019-06-09 LAB — CMP (CANCER CENTER ONLY)
ALT: 100 U/L — ABNORMAL HIGH (ref 0–44)
AST: 60 U/L — ABNORMAL HIGH (ref 15–41)
Albumin: 3.5 g/dL (ref 3.5–5.0)
Alkaline Phosphatase: 88 U/L (ref 38–126)
Anion gap: 9 (ref 5–15)
BUN: 14 mg/dL (ref 8–23)
CO2: 21 mmol/L — ABNORMAL LOW (ref 22–32)
Calcium: 8.6 mg/dL — ABNORMAL LOW (ref 8.9–10.3)
Chloride: 110 mmol/L (ref 98–111)
Creatinine: 0.81 mg/dL (ref 0.44–1.00)
GFR, Est AFR Am: >60 mL/min (ref >60)
GFR, Estimated: >60 mL/min (ref >60)
Glucose, Bld: 91 mg/dL (ref 70–99)
Potassium: 3.7 mmol/L (ref 3.5–5.1)
Sodium: 140 mmol/L (ref 135–145)
Total Bilirubin: 1.1 mg/dL (ref 0.3–1.2)
Total Protein: 6 g/dL — ABNORMAL LOW (ref 6.5–8.1)

## 2019-06-09 LAB — CBC WITH DIFFERENTIAL/PLATELET
Abs Immature Granulocytes: 0.08 10*3/uL — ABNORMAL HIGH (ref 0.00–0.07)
Basophils Absolute: 0 10*3/uL (ref 0.0–0.1)
Basophils Relative: 0 %
Eosinophils Absolute: 0 10*3/uL (ref 0.0–0.5)
Eosinophils Relative: 0 %
HCT: 28.5 % — ABNORMAL LOW (ref 36.0–46.0)
Hemoglobin: 9.4 g/dL — ABNORMAL LOW (ref 12.0–15.0)
Immature Granulocytes: 1 %
Lymphocytes Relative: 13 %
Lymphs Abs: 1 10*3/uL (ref 0.7–4.0)
MCH: 32.5 pg (ref 26.0–34.0)
MCHC: 33 g/dL (ref 30.0–36.0)
MCV: 98.6 fL (ref 80.0–100.0)
Monocytes Absolute: 0.9 10*3/uL (ref 0.1–1.0)
Monocytes Relative: 12 %
Neutro Abs: 5.6 10*3/uL (ref 1.7–7.7)
Neutrophils Relative %: 74 %
Platelets: 126 10*3/uL — ABNORMAL LOW (ref 150–400)
RBC: 2.89 MIL/uL — ABNORMAL LOW (ref 3.87–5.11)
RDW: 15.6 % — ABNORMAL HIGH (ref 11.5–15.5)
WBC: 7.6 10*3/uL (ref 4.0–10.5)
nRBC: 1.1 % — ABNORMAL HIGH (ref 0.0–0.2)

## 2019-06-09 MED ORDER — ACETAMINOPHEN 500 MG PO TABS
1000.0000 mg | ORAL_TABLET | Freq: Once | ORAL | Status: AC
  Administered 2019-06-09: 13:00:00 1000 mg via ORAL

## 2019-06-09 MED ORDER — SODIUM CHLORIDE 0.9% FLUSH
10.0000 mL | Freq: Once | INTRAVENOUS | Status: AC
  Administered 2019-06-09: 10 mL
  Filled 2019-06-09: qty 10

## 2019-06-09 MED ORDER — DEXTROSE 5 % IV SOLN: 29.0000 mg/m2 | Freq: Once | INTRAVENOUS | Status: DC

## 2019-06-09 MED ORDER — DEXTROSE 5 % IV SOLN
29.0000 mg/m2 | Freq: Once | INTRAVENOUS | Status: AC
  Administered 2019-06-09: 14:00:00 60 mg via INTRAVENOUS
  Filled 2019-06-09: qty 30

## 2019-06-09 MED ORDER — PROCHLORPERAZINE MALEATE 10 MG PO TABS
10.0000 mg | ORAL_TABLET | Freq: Once | ORAL | Status: AC
  Administered 2019-06-09: 10 mg via ORAL

## 2019-06-09 MED ORDER — HEPARIN SOD (PORK) LOCK FLUSH 100 UNIT/ML IV SOLN
500.0000 [IU] | Freq: Once | INTRAVENOUS | Status: AC | PRN
  Administered 2019-06-09: 14:00:00 500 [IU]
  Filled 2019-06-09: qty 5

## 2019-06-09 MED ORDER — SODIUM CHLORIDE 0.9 % IV SOLN
Freq: Once | INTRAVENOUS | Status: AC
  Administered 2019-06-09: 14:00:00 via INTRAVENOUS
  Filled 2019-06-09: qty 250

## 2019-06-09 MED ORDER — SODIUM CHLORIDE 0.9 % IV SOLN
20.0000 mg | Freq: Once | INTRAVENOUS | Status: AC
  Administered 2019-06-09: 20 mg via INTRAVENOUS
  Filled 2019-06-09: qty 20

## 2019-06-09 MED ORDER — SODIUM CHLORIDE 0.9 % IV SOLN
Freq: Once | INTRAVENOUS | Status: AC
  Administered 2019-06-09: 13:00:00 via INTRAVENOUS
  Filled 2019-06-09: qty 250

## 2019-06-09 MED ORDER — ACETAMINOPHEN 500 MG PO TABS
ORAL_TABLET | ORAL | Status: AC
  Filled 2019-06-09: qty 2

## 2019-06-09 MED ORDER — SODIUM CHLORIDE 0.9% FLUSH
10.0000 mL | INTRAVENOUS | Status: DC | PRN
  Administered 2019-06-09: 14:00:00 10 mL
  Filled 2019-06-09: qty 10

## 2019-06-09 MED ORDER — PROCHLORPERAZINE MALEATE 10 MG PO TABS
ORAL_TABLET | ORAL | Status: AC
  Filled 2019-06-09: qty 1

## 2019-06-09 NOTE — Telephone Encounter (Signed)
Per 8/25 los appts already scheduled.

## 2019-06-09 NOTE — Progress Notes (Signed)
Verbal order per Dr. Irene Limbo: Madison Parker to treat with today's lab values

## 2019-06-09 NOTE — Patient Instructions (Signed)
[facility] Discharge Instructions for Patients Receiving Chemotherapy  Today you received the following chemotherapy agents:  Kyprolis  To help prevent nausea and vomiting after your treatment, we encourage you to take your nausea medication as directed.   If you develop nausea and vomiting that is not controlled by your nausea medication, call the clinic.   BELOW ARE SYMPTOMS THAT SHOULD BE REPORTED IMMEDIATELY:  *FEVER GREATER THAN 100.5 F  *CHILLS WITH OR WITHOUT FEVER  NAUSEA AND VOMITING THAT IS NOT CONTROLLED WITH YOUR NAUSEA MEDICATION  *UNUSUAL SHORTNESS OF BREATH  *UNUSUAL BRUISING OR BLEEDING  TENDERNESS IN MOUTH AND THROAT WITH OR WITHOUT PRESENCE OF ULCERS  *URINARY PROBLEMS  *BOWEL PROBLEMS  UNUSUAL RASH Items with * indicate a potential emergency and should be followed up as soon as possible.  Feel free to call the clinic should you have any questions or concerns. The clinic phone number is [phone].  Please show the CHEMO ALERT CARD at check-in to the Emergency Department and triage nurse.   

## 2019-06-11 ENCOUNTER — Telehealth: Payer: Self-pay | Admitting: Hematology

## 2019-06-11 NOTE — Telephone Encounter (Signed)
Called patient regarding infusion log, informed patient 09/09 appointment time has been rescheduled.

## 2019-06-16 ENCOUNTER — Telehealth: Payer: Self-pay | Admitting: *Deleted

## 2019-06-16 NOTE — Telephone Encounter (Signed)
Voice Mail received: Madison Parker w/[employer] [phone] to verify appointments on 05/11/2019, 05/26/2019, 06/01/2019, 06/02/2019, 06/08/2019, 06/09/2019. Madison Parker brought [name] on line to verbally give approval for call/information to be shared.  Contacted [facility], gave reference number, spoke with [facility] and confirmed appts as noted above.

## 2019-06-22 NOTE — Progress Notes (Signed)
Marland Kitchen    HEMATOLOGY/ONCOLOGY CLINIC NOTE  Date of Service: 06/23/19     PCP: Marzetta Board MD  CHIEF COMPLAINTS/PURPOSE OF CONSULTATION:  Continued f/u for mx of myeloma   HISTORY OF PRESENTING ILLNESS:   Madison Parker is a wonderful 68 y.o. 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  Patient has a history of smoldering multiple myeloma which she reports she was diagnosed with in 2011 when she was evaluated by a hematologist in [place]. She reports that she presented with low blood counts (low WBC)   and after significant workup she had a bone marrow examination based on which she was told that she has smoldering multiple myeloma. Patient notes that she had a bone survey at that time which was negative. She was monitored there closely and then moved to [facility] in 2015 to stay with her son whose wife was being treated for breast cancer. Patient was following with Dr. Delight Hoh in [place]. She reports not having had any treatment for multiple myeloma.  Her last labs from about a year ago showed a SPEP with no OBSERVED M spike . Serum free light chains showed an elevation of kappa Free light chain 310 and lambda free light chain of 12.7 with an abnormal Kappa/ Lambda ratio of 24.38 ( up from 19 previously). Random urine showed an M protein complement of 44%.  She lives in [place] and requested transfer of care to [place]. She was offered follow-up but [facility] in [place]  But prefers to  follow [place] in [place]. Patient reports her energy levels been stable. She has chronic back pain which has improved since the patient had her spinal surgery in April 2017 (L4-L5 interbody fusion). Patient notes she has had some chronic left hip pain which is somewhat more bothersome with the navy on the lateral aspect of her upper thighs that's painful. She also notes some right hip pain.  Over the last few weeks he notes some upper neck pain especially when bending her neck backwards and tingling numbness in her right upper extremity.  Has been working on losing some weight voluntarily.  No acute other new symptoms.   INTERVAL HISTORY:   [name] returns today for f/u for her smoldering multiple myeloma. The patient's last visit with [place] was on 06/09/2019. The pt reports that she is doing well overall.  The pt reports that on day 18 of her last cycle of treatment she experienced a strong cramp in her leg, which left her sore the next day. Her leg is still slightly sore at this time and has been improving over the past couple of days. Pt does not want the flu shot at this time. Pt has been taking her Asprin daily.   Lab results today (06/23/19) of CBC w/diff and CMP is as follows: all values are WNL except for WBC at 3.8K, RBC at 3.26, Hgb at 10.4, HCT at 32.9, MCV at 100.9, Neutro Abs at 1.3, Calcium at 8.8, ALT at 67 06/23/2019 K/L light chains Kappa free light chains improved to 38.1 with ratio of 2.43  On review of systems, pt reports back pain, leg cramps/soreness and denies diarrhea, nausea, abdominal pain, leg swelling and any other symptoms.   MEDICAL HISTORY:  Past Medical History:  Diagnosis Date  . Headache   . Low back pain   . SBO (small bowel obstruction) ([place]) 2010  .  Smoldering multiple myeloma ([facility])   Previous history of hypothyroidism- not currently medications Anxiety Obesity .Body mass index is 37.87 kg/m. Vitamin D deficiency Smoldering multiple myeloma diagnosed in 2011 Lumbosacral radiculopathy Left hip pain  SURGICAL HISTORY: Past Surgical History:  Procedure Laterality Date  . ABDOMINAL HYSTERECTOMY     complete  . BACK SURGERY     lower at [facility]  . colonscopy  2014  . GASTRIC BYPASS  yrs ago  . HERNIA REPAIR    . IR IMAGING GUIDED PORT INSERTION  04/20/2019  . KNEE SURGERY  06/04/2009   both knees replaced   . TONSILLECTOMY   age 62  . TOTAL HIP ARTHROPLASTY Left 08/16/2017   Procedure: LEFT TOTAL HIP ARTHROPLASTY ANTERIOR APPROACH;  Surgeon: Dorna Leitz, MD;  Location: [facility];  Service: Orthopedics;  Laterality: Left;  Spinal surgery April 2017  SOCIAL HISTORY: Social History   Socioeconomic History  . Marital status: Married    Spouse name: Not on file  . Number of children: Not on file  . Years of education: Not on file  . Highest education level: Not on file  Occupational History  . Not on file  Social Needs  . Financial resource strain: Not on file  . Food insecurity    Worry: Not on file    Inability: Not on file  . Transportation needs    Medical: Not on file    Non-medical: Not on file  Tobacco Use  . Smoking status: Former Smoker    Packs/day: 0.50    Years: 10.00    Pack years: 5.00    Types: Cigarettes  . Smokeless tobacco: Never Used  . Tobacco comment: quit 1977  Substance and Sexual Activity  . Alcohol use: Yes    Comment: occ  . Drug use: No  . Sexual activity: Not on file  Lifestyle  . Physical activity    Days per week: Not on file    Minutes per session: Not on file  . Stress: Not on file  Relationships  . Social Herbalist on phone: Not on file    Gets together: Not on file    Attends religious service: Not on file    Active member of club or organization: Not on file    Attends meetings of clubs or organizations: Not on file    Relationship status: Not on file  . Intimate partner violence    Fear of current or ex partner: Not on file    Emotionally abused: Not on file    Physically abused: Not on file    Forced sexual activity: Not on file  Other Topics Concern  . Not on file  Social History Narrative  . Not on file  Occasional alcohol use Former smoker and smoked 1 pack per week for about 9 years has since quit.  FAMILY HISTORY: No family history on file.  ALLERGIES:  is allergic to codeine.  MEDICATIONS:  Current Outpatient Medications   Medication Sig Dispense Refill  . acetaminophen (TYLENOL) 650 MG CR tablet Take 650 mg by mouth every 8 (eight) hours as needed for pain.    Marland Kitchen acyclovir (ZOVIRAX) 400 MG tablet Take 1 tablet (400 mg total) by mouth 2 (two) times daily. 180 tablet 0  . ascorbic acid (VITAMIN C) 500 MG tablet Take 500 mg by mouth daily.    Marland Kitchen aspirin EC 81 MG tablet Take 81 mg by mouth daily.    . B Complex-C (B-COMPLEX WITH  VITAMIN C) tablet Take 1 tablet by mouth daily.    . ergocalciferol (VITAMIN D2) 1.25 MG (50000 UT) capsule Take 1 capsule (50,000 Units total) by mouth once a week. 13 capsule 0  . lenalidomide (REVLIMID) 15 MG capsule Take 1 capsule (15 mg total) by mouth daily. Take for 21 d on, 7 d off, repeat every 28d 21 capsule 0  . lidocaine-prilocaine (EMLA) cream Apply 1 application topically as needed. 30 g 2  . MULTIPLE VITAMIN PO Take 1 tablet by mouth daily.    . ondansetron (ZOFRAN) 8 MG tablet Take 1 tablet (8 mg total) by mouth 2 (two) times daily as needed (Nausea or vomiting). 30 tablet 1  . Potassium 99 MG TABS Take 99 mg by mouth daily.    . prochlorperazine (COMPAZINE) 10 MG tablet Take 1 tablet (10 mg total) by mouth every 6 (six) hours as needed for nausea or vomiting. 30 tablet 0  . prochlorperazine (COMPAZINE) 10 MG tablet Take 1 tablet (10 mg total) by mouth every 6 (six) hours as needed (Nausea or vomiting). 30 tablet 1  . vitamin B-12 (CYANOCOBALAMIN) 1000 MCG tablet Take 5,000 mcg by mouth daily.      No current facility-administered medications for this visit.    Facility-Administered Medications Ordered in Other Visits  Medication Dose Route Frequency Provider Last Rate Last Dose  . sodium chloride flush (NS) 0.9 % injection 10 mL  10 mL Intracatheter PRN Brunetta Genera, MD   10 mL at 05/11/19 1437    REVIEW OF SYSTEMS:    A 10+ POINT REVIEW OF SYSTEMS WAS OBTAINED including neurology, dermatology, psychiatry, cardiac, respiratory, lymph, extremities, GI, GU,  Musculoskeletal, constitutional, breasts, reproductive, HEENT.  All pertinent positives are noted in the HPI.  All others are negative.   PHYSICAL EXAMINATION: ECOG PERFORMANCE STATUS: 1 - Symptomatic but completely ambulatory  Vitals:   06/23/19 1437  BP: 130/72  Pulse: 74  Resp: 18  Temp: 98.2 F (36.8 C)  SpO2: 97%   Filed Weights   06/23/19 1437  Weight: 213 lb 12.8 oz (97 kg)   .Body mass index is 37.87 kg/m.   GENERAL:alert, in no acute distress and comfortable SKIN: no acute rashes, no significant lesions EYES: conjunctiva are pink and non-injected, sclera anicteric OROPHARYNX: MMM, no exudates, no oropharyngeal erythema or ulceration NECK: supple, no JVD LYMPH:  no palpable lymphadenopathy in the cervical, axillary or inguinal regions LUNGS: clear to auscultation b/l with normal respiratory effort HEART: regular rate & rhythm ABDOMEN:  normoactive bowel sounds , non tender, not distended. No palpable hepatosplenomegaly.  Extremity: no pedal edema. pain with palpation from lower left leg to medial inner thigh concerning for r/o DVT vs superficial venous thrombosis PSYCH: alert & oriented x 3 with fluent speech NEURO: no focal motor/sensory deficits   LABORATORY DATA:  I have reviewed the data as listed  . CBC Latest Ref Rng & Units 06/23/2019 06/09/2019 06/08/2019  WBC 4.0 - 10.5 K/uL 3.8(L) 7.6 3.7(L)  Hemoglobin 12.0 - 15.0 g/dL 10.4(L) 9.4(L) 9.6(L)  Hematocrit 36.0 - 46.0 % 32.9(L) 28.5(L) 29.9(L)  Platelets 150 - 400 K/uL 226 126(L) 123(L)  ANC 1300 . CBC    Component Value Date/Time   WBC 3.8 (L) 06/23/2019 1400   RBC 3.26 (L) 06/23/2019 1400   HGB 10.4 (L) 06/23/2019 1400   HGB 9.4 (L) 05/04/2019 1137   HGB 11.3 (L) 09/30/2017 1145   HCT 32.9 (L) 06/23/2019 1400   HCT 35.1 09/30/2017 1145  PLT 226 06/23/2019 1400   PLT 126 (L) 05/04/2019 1137   PLT 172 09/30/2017 1145   MCV 100.9 (H) 06/23/2019 1400   MCV 97.5 09/30/2017 1145   MCH 31.9  06/23/2019 1400   MCHC 31.6 06/23/2019 1400   RDW 14.8 06/23/2019 1400   RDW 14.0 09/30/2017 1145   LYMPHSABS 1.9 06/23/2019 1400   LYMPHSABS 1.3 09/30/2017 1145   MONOABS 0.4 06/23/2019 1400   MONOABS 0.2 09/30/2017 1145   EOSABS 0.1 06/23/2019 1400   EOSABS 0.0 09/30/2017 1145   EOSABS 0.1 06/03/2014 1003   BASOSABS 0.1 06/23/2019 1400   BASOSABS 0.0 09/30/2017 1145     . CMP Latest Ref Rng & Units 06/23/2019 06/09/2019 06/08/2019  Glucose 70 - 99 mg/dL 74 91 88  BUN 8 - 23 mg/dL _0 Creatinine 0.44 - 1.00 mg/dL 0.96 0.81 0.77  Sodium 135 - 145 mmol/L 142 140 141  Potassium 3.5 - 5.1 mmol/L 3.9 3.7 3.8  Chloride 98 - 111 mmol/L 111 110 111  CO2 22 - 32 mmol/L 22 21(L) 22  Calcium 8.9 - 10.3 mg/dL 8.8(L) 8.6(L) 8.3(L)  Total Protein 6.5 - 8.1 g/dL 6.5 6.0(L) 5.9(L)  Total Bilirubin 0.3 - 1.2 mg/dL 0.7 1.1 1.0  Alkaline Phos 38 - 126 U/L 118 88 84  AST 15 - 41 U/L 26 60(H) 25  ALT 0 - 44 U/L 67(H) 100(H) 50(H)         Component     Latest Ref Rng & Units 11/08/2016  LDH     125 - 245 U/L 220  Beta 2     0.6 - 2.4 mg/L 2.5 (H)   Component     Latest Ref Rng & Units 07/30/2017  Ferritin     9 - 269 ng/ml 81  Vitamin B12     232 - 1,245 pg/mL 594    03/11/19 BM Bx:   03/11/19 Cytogenetics:       RADIOGRAPHIC STUDIES: I have personally reviewed the radiological images as listed and agreed with the findings in the report. No results found.   Bone survey 11/08/2016 IMPRESSION: 1. Small lytic lesion in the right scapula. 2. Possible small lytic lesion in the left scapula. 3. Postoperative changes. 4. Significant degenerative changes in both hips, left greater than right. 5. No evidence for acute fracture   Electronically Signed   By: Nolon Nations M.D.   On: 11/08/2016 16:19   ASSESSMENT & PLAN:   68 y.o. very pleasant lady with history of   1) Multiple myeloma (concern for small lytic lesions in left and right scapulae) - (appears  light chain producing) and Progressive anemia  This was apparently diagnosed as smoldering myeloma in 2011 by her hematologist in [place].  No renal failure/hypercalcemia.  Bone survey with concern for possible small lytic lesions in B/L scapulae. PET/CT scan on 03/12/2017 showed no concerning bone lesions.  Initial Bm Bx with 17% kappa restricted plasma cells consistent with plasma cell neoplasm.  No treatment so far.  03/11/19 BM Bx revealed involvement by 50% plasma cells; genetics -high risk t(14;16), previous genetics from April 2018 BM Bx were standard risk  03/16/19 MRI Lumbar reveals several explanations for her lower back pain including L2-L3 stenosis, but reassuringly, there was not evidence of involvement by multiple myeloma  04/01/19 PET/CT which revealed no FDG evidence of active multiple myeloma within the skeleton. No evidence of lytic lesions within the skeleton or soft tissue Plasmacytoma.  05/07/2019 DXA  scan revealed osteopenia, T-score of -1.2.  2) Chronic leukopenia - ? Related to SMM vs other nutritional deficiencies (h/o gastric bypass surgery put her at risk for nutritional deficiencies)  B12 levels WNL  Ferritin adequate  ?additional factor -recent NSAID use.  ?element of benign ethnic neutropenia.  Has not had any issues with frequent infection.   ANC post-operative normalized today at 1700  4) Neuropathy/radiculopathy -Continue follow up with orthopedics  PLAN:  -Discussed pt labwork today, 06/23/19; all values are WNL except for WBC at 3.8K, RBC at 3.26, Hgb at 10.4, HCT at 32.9, MCV at 100.9, Neutro Abs at 1.3, Calcium at 8.8, ALT at 67 -Discussed 06/23/2019 K/L light chains is in progress   -Between 4-6 cycles, depending on labs we will repeat scans and re-evaluate Tx. Maintenance vs transplant. -The pt has no prohibitive toxicities from continuing C3D1 Carfilzomib, Dexamethasone, Revlimid at this time -Continue Acyclovir -Continue Aspirin  -Advised pt to intake dietary Magnesium via cashews, almonds, pistachios -Recommended that the pt continue to eat well, drink at least 48-64 oz of water each day, and walk 20-30 minutes each day.  -Advised pt to take Vitamin D 2000 mg po daily -Recommend seasonal flu shot -Will order [place] of pt's lower left leg to r/o a blood clot -Will adjust treatment if necessary based on US findings -Will see back in 4 weeks with labs   FOLLOW UP: -US venous bilateral lower extremities stat today/tomorrow -please schedule C4 of treatment with labs -MD visit in 4 weeks with C4D1    The total time spent in the appt was 25 minutes and more than 50% was on counseling and direct patient cares.  All of the patient's questions were answered with apparent satisfaction. The patient knows to call the clinic with any problems, questions or concerns.    Sullivan Lone MD [place] AAHIVMS [facility] Hematology/Oncology Physician [facility]  (Office):       [phone] (Work cell):  [phone] (Fax):           [phone]  I, Yevette Edwards, am acting as a scribe for Dr. Sullivan Lone.   .I have reviewed the above documentation for accuracy and completeness, and I agree with the above. Brunetta Genera MD   ADDENDUM  US venous left : Right: There is no evidence of deep vein thrombosis in the lower extremity. No cystic structure found in the popliteal fossa. Left: Findings consistent with acute superficial vein thrombosis involving the left small saphenous vein. There is no evidence of deep vein thrombosis in the lower extremity. However, portions of this examination were limited- see technologist comments  above. No cystic structure found in the popliteal fossa.   *See table(s) above for measurements and observations.  Electronically signed by Curt Jews MD on 06/24/2019 at 4:48:58 PM.  PLAN -given patient is on Revlimid will treat SVT with therapeutic anticoagulation with Eliquis and hold  ASA.  Brunetta Genera MD

## 2019-06-23 ENCOUNTER — Inpatient Hospital Stay: Payer: Medicare Other

## 2019-06-23 ENCOUNTER — Inpatient Hospital Stay (HOSPITAL_BASED_OUTPATIENT_CLINIC_OR_DEPARTMENT_OTHER): Payer: Medicare Other | Admitting: Hematology

## 2019-06-23 ENCOUNTER — Inpatient Hospital Stay: Payer: Medicare Other | Attending: Hematology

## 2019-06-23 ENCOUNTER — Other Ambulatory Visit: Payer: Self-pay

## 2019-06-23 VITALS — BP 130/72 | HR 74 | Temp 98.2°F | Resp 18 | Ht 63.0 in | Wt 213.8 lb

## 2019-06-23 DIAGNOSIS — D709 Neutropenia, unspecified: Secondary | ICD-10-CM | POA: Diagnosis not present

## 2019-06-23 DIAGNOSIS — C9000 Multiple myeloma not having achieved remission: Secondary | ICD-10-CM

## 2019-06-23 DIAGNOSIS — R252 Cramp and spasm: Secondary | ICD-10-CM | POA: Diagnosis not present

## 2019-06-23 DIAGNOSIS — G629 Polyneuropathy, unspecified: Secondary | ICD-10-CM | POA: Insufficient documentation

## 2019-06-23 DIAGNOSIS — D649 Anemia, unspecified: Secondary | ICD-10-CM

## 2019-06-23 DIAGNOSIS — Z5111 Encounter for antineoplastic chemotherapy: Secondary | ICD-10-CM

## 2019-06-23 DIAGNOSIS — Z5112 Encounter for antineoplastic immunotherapy: Secondary | ICD-10-CM | POA: Insufficient documentation

## 2019-06-23 DIAGNOSIS — Z7189 Other specified counseling: Secondary | ICD-10-CM

## 2019-06-23 DIAGNOSIS — Z95828 Presence of other vascular implants and grafts: Secondary | ICD-10-CM

## 2019-06-23 LAB — CMP (CANCER CENTER ONLY)
ALT: 67 U/L — ABNORMAL HIGH (ref 0–44)
AST: 26 U/L (ref 15–41)
Albumin: 3.8 g/dL (ref 3.5–5.0)
Alkaline Phosphatase: 118 U/L (ref 38–126)
Anion gap: 9 (ref 5–15)
BUN: 12 mg/dL (ref 8–23)
CO2: 22 mmol/L (ref 22–32)
Calcium: 8.8 mg/dL — ABNORMAL LOW (ref 8.9–10.3)
Chloride: 111 mmol/L (ref 98–111)
Creatinine: 0.96 mg/dL (ref 0.44–1.00)
GFR, Est AFR Am: >60 mL/min (ref >60)
GFR, Estimated: >60 mL/min (ref >60)
Glucose, Bld: 74 mg/dL (ref 70–99)
Potassium: 3.9 mmol/L (ref 3.5–5.1)
Sodium: 142 mmol/L (ref 135–145)
Total Bilirubin: 0.7 mg/dL (ref 0.3–1.2)
Total Protein: 6.5 g/dL (ref 6.5–8.1)

## 2019-06-23 LAB — CBC WITH DIFFERENTIAL/PLATELET
Abs Immature Granulocytes: 0 10*3/uL (ref 0.00–0.07)
Basophils Absolute: 0.1 10*3/uL (ref 0.0–0.1)
Basophils Relative: 2 %
Eosinophils Absolute: 0.1 10*3/uL (ref 0.0–0.5)
Eosinophils Relative: 3 %
HCT: 32.9 % — ABNORMAL LOW (ref 36.0–46.0)
Hemoglobin: 10.4 g/dL — ABNORMAL LOW (ref 12.0–15.0)
Immature Granulocytes: 0 %
Lymphocytes Relative: 51 %
Lymphs Abs: 1.9 10*3/uL (ref 0.7–4.0)
MCH: 31.9 pg (ref 26.0–34.0)
MCHC: 31.6 g/dL (ref 30.0–36.0)
MCV: 100.9 fL — ABNORMAL HIGH (ref 80.0–100.0)
Monocytes Absolute: 0.4 10*3/uL (ref 0.1–1.0)
Monocytes Relative: 9 %
Neutro Abs: 1.3 10*3/uL — ABNORMAL LOW (ref 1.7–7.7)
Neutrophils Relative %: 35 %
Platelets: 226 10*3/uL (ref 150–400)
RBC: 3.26 MIL/uL — ABNORMAL LOW (ref 3.87–5.11)
RDW: 14.8 % (ref 11.5–15.5)
WBC: 3.8 10*3/uL — ABNORMAL LOW (ref 4.0–10.5)
nRBC: 0 % (ref 0.0–0.2)

## 2019-06-23 MED ORDER — SODIUM CHLORIDE 0.9 % IV SOLN
Freq: Once | INTRAVENOUS | Status: AC
  Administered 2019-06-23: 16:00:00 via INTRAVENOUS
  Filled 2019-06-23: qty 250

## 2019-06-23 MED ORDER — ACETAMINOPHEN 500 MG PO TABS
1000.0000 mg | ORAL_TABLET | Freq: Once | ORAL | Status: AC
  Administered 2019-06-23: 1000 mg via ORAL

## 2019-06-23 MED ORDER — DEXTROSE 5 % IV SOLN
60.0000 mg | Freq: Once | INTRAVENOUS | Status: AC
  Administered 2019-06-23: 17:00:00 60 mg via INTRAVENOUS
  Filled 2019-06-23: qty 30

## 2019-06-23 MED ORDER — SODIUM CHLORIDE 0.9 % IV SOLN
20.0000 mg | Freq: Once | INTRAVENOUS | Status: AC
  Administered 2019-06-23: 20 mg via INTRAVENOUS
  Filled 2019-06-23: qty 2

## 2019-06-23 MED ORDER — SODIUM CHLORIDE 0.9% FLUSH
10.0000 mL | Freq: Once | INTRAVENOUS | Status: AC
  Administered 2019-06-23: 10 mL
  Filled 2019-06-23: qty 10

## 2019-06-23 MED ORDER — SODIUM CHLORIDE 0.9% FLUSH
10.0000 mL | INTRAVENOUS | Status: DC | PRN
  Administered 2019-06-23: 10 mL
  Filled 2019-06-23: qty 10

## 2019-06-23 MED ORDER — HEPARIN SOD (PORK) LOCK FLUSH 100 UNIT/ML IV SOLN
500.0000 [IU] | Freq: Once | INTRAVENOUS | Status: AC | PRN
  Administered 2019-06-23: 500 [IU]
  Filled 2019-06-23: qty 5

## 2019-06-23 MED ORDER — ACETAMINOPHEN 500 MG PO TABS
ORAL_TABLET | ORAL | Status: AC
  Filled 2019-06-23: qty 2

## 2019-06-23 MED ORDER — PROCHLORPERAZINE MALEATE 10 MG PO TABS
10.0000 mg | ORAL_TABLET | Freq: Once | ORAL | Status: AC
  Administered 2019-06-23: 10 mg via ORAL

## 2019-06-23 MED ORDER — PROCHLORPERAZINE MALEATE 10 MG PO TABS
ORAL_TABLET | ORAL | Status: AC
  Filled 2019-06-23: qty 1

## 2019-06-23 NOTE — Progress Notes (Signed)
Verbal order - per Dr. Irene Limbo, ok to treat today with ANC 1.3

## 2019-06-23 NOTE — Patient Instructions (Signed)
[facility] Discharge Instructions for Patients Receiving Chemotherapy  Today you received the following chemotherapy agents:  Kyprolis  To help prevent nausea and vomiting after your treatment, we encourage you to take your nausea medication as directed.   If you develop nausea and vomiting that is not controlled by your nausea medication, call the clinic.   BELOW ARE SYMPTOMS THAT SHOULD BE REPORTED IMMEDIATELY:  *FEVER GREATER THAN 100.5 F  *CHILLS WITH OR WITHOUT FEVER  NAUSEA AND VOMITING THAT IS NOT CONTROLLED WITH YOUR NAUSEA MEDICATION  *UNUSUAL SHORTNESS OF BREATH  *UNUSUAL BRUISING OR BLEEDING  TENDERNESS IN MOUTH AND THROAT WITH OR WITHOUT PRESENCE OF ULCERS  *URINARY PROBLEMS  *BOWEL PROBLEMS  UNUSUAL RASH Items with * indicate a potential emergency and should be followed up as soon as possible.  Feel free to call the clinic should you have any questions or concerns. The clinic phone number is [phone].  Please show the CHEMO ALERT CARD at check-in to the Emergency Department and triage nurse.   

## 2019-06-24 ENCOUNTER — Telehealth: Payer: Self-pay | Admitting: *Deleted

## 2019-06-24 ENCOUNTER — Telehealth: Payer: Self-pay | Admitting: Hematology

## 2019-06-24 ENCOUNTER — Inpatient Hospital Stay: Payer: Medicare Other

## 2019-06-24 ENCOUNTER — Other Ambulatory Visit: Payer: Self-pay

## 2019-06-24 ENCOUNTER — Ambulatory Visit: Payer: Medicare Other

## 2019-06-24 ENCOUNTER — Ambulatory Visit (HOSPITAL_COMMUNITY)
Admission: RE | Admit: 2019-06-24 | Discharge: 2019-06-24 | Disposition: A | Discharge status: Home / Self Care | Payer: Medicare Other | Source: Ambulatory Visit | Attending: Hematology | Admitting: Hematology

## 2019-06-24 ENCOUNTER — Other Ambulatory Visit: Payer: Self-pay | Admitting: Hematology

## 2019-06-24 VITALS — BP 114/67 | HR 79 | Temp 98.2°F | Resp 19

## 2019-06-24 DIAGNOSIS — C9000 Multiple myeloma not having achieved remission: Secondary | ICD-10-CM | POA: Diagnosis not present

## 2019-06-24 DIAGNOSIS — I82812 Embolism and thrombosis of superficial veins of left lower extremities: Secondary | ICD-10-CM

## 2019-06-24 DIAGNOSIS — Z7189 Other specified counseling: Secondary | ICD-10-CM

## 2019-06-24 DIAGNOSIS — R252 Cramp and spasm: Secondary | ICD-10-CM | POA: Diagnosis not present

## 2019-06-24 DIAGNOSIS — Z5112 Encounter for antineoplastic immunotherapy: Secondary | ICD-10-CM | POA: Diagnosis not present

## 2019-06-24 DIAGNOSIS — G629 Polyneuropathy, unspecified: Secondary | ICD-10-CM | POA: Diagnosis not present

## 2019-06-24 DIAGNOSIS — D709 Neutropenia, unspecified: Secondary | ICD-10-CM | POA: Diagnosis not present

## 2019-06-24 LAB — KAPPA/LAMBDA LIGHT CHAINS
Kappa free light chain: 38.1 mg/L — ABNORMAL HIGH (ref 3.3–19.4)
Kappa, lambda light chain ratio: 2.43 — ABNORMAL HIGH (ref 0.26–1.65)
Lambda free light chains: 15.7 mg/L (ref 5.7–26.3)

## 2019-06-24 MED ORDER — ACETAMINOPHEN 500 MG PO TABS
ORAL_TABLET | ORAL | Status: AC
  Filled 2019-06-24: qty 2

## 2019-06-24 MED ORDER — SODIUM CHLORIDE 0.9 % IV SOLN
Freq: Once | INTRAVENOUS | Status: AC
  Administered 2019-06-24: 14:00:00 via INTRAVENOUS
  Filled 2019-06-24: qty 250

## 2019-06-24 MED ORDER — SODIUM CHLORIDE 0.9 % IV SOLN
Freq: Once | INTRAVENOUS | Status: AC
  Administered 2019-06-24: 15:00:00 via INTRAVENOUS
  Filled 2019-06-24: qty 250

## 2019-06-24 MED ORDER — SODIUM CHLORIDE 0.9 % IV SOLN
20.0000 mg | Freq: Once | INTRAVENOUS | Status: AC
  Administered 2019-06-24: 20 mg via INTRAVENOUS
  Filled 2019-06-24: qty 20

## 2019-06-24 MED ORDER — PROCHLORPERAZINE MALEATE 10 MG PO TABS
10.0000 mg | ORAL_TABLET | Freq: Once | ORAL | Status: AC
  Administered 2019-06-24: 10 mg via ORAL

## 2019-06-24 MED ORDER — DEXTROSE 5 % IV SOLN
29.0000 mg/m2 | Freq: Once | INTRAVENOUS | Status: AC
  Administered 2019-06-24: 60 mg via INTRAVENOUS
  Filled 2019-06-24: qty 30

## 2019-06-24 MED ORDER — SODIUM CHLORIDE 0.9% FLUSH
10.0000 mL | INTRAVENOUS | Status: DC | PRN
  Administered 2019-06-24: 10 mL
  Filled 2019-06-24: qty 10

## 2019-06-24 MED ORDER — ACETAMINOPHEN 500 MG PO TABS
1000.0000 mg | ORAL_TABLET | Freq: Once | ORAL | Status: AC
  Administered 2019-06-24: 1000 mg via ORAL

## 2019-06-24 MED ORDER — HEPARIN SOD (PORK) LOCK FLUSH 100 UNIT/ML IV SOLN
500.0000 [IU] | Freq: Once | INTRAVENOUS | Status: AC | PRN
  Administered 2019-06-24: 500 [IU]
  Filled 2019-06-24: qty 5

## 2019-06-24 MED ORDER — APIXABAN 5 MG PO TABS: 5.0000 mg | ORAL_TABLET | Freq: Two times a day (BID) | ORAL | 2 refills | Status: DC

## 2019-06-24 MED ORDER — PROCHLORPERAZINE MALEATE 10 MG PO TABS
ORAL_TABLET | ORAL | Status: AC
  Filled 2019-06-24: qty 1

## 2019-06-24 NOTE — Progress Notes (Signed)
Bilateral lower extremity venous duplex completed. Refer to "CV Proc" under chart review to view preliminary results.  06/24/2019 1:37 PM Maudry Mayhew, MHA, RVT, RDCS, RDMS

## 2019-06-24 NOTE — Telephone Encounter (Signed)
Scheduled appt per 9/8 los.

## 2019-06-24 NOTE — Progress Notes (Signed)
Following instructions discussed with patient per Dr. Irene Limbo regarding superficial clot and starting Eliquis:   Dr. Irene Limbo is prescribing Eliquis for the superficial blood clot. Please start Eliquis as soon as you get it from your pharmacy and take twice a day (once in the morning and once in the evening). Resume Revlimid after 48 hours of being on the Eliquis. You will take the Eliquis for 1-3 months--Dr. Irene Limbo will re-evaluate during that time to determine when you'll be decreased to half that dose. Go on light walks throughtout the day to help with blood circulation and apply warm compresses to area of discomfort. Don't hesitate to call if you have any questions or concerns in the meantime.   Pt verbalized understanding and agreement

## 2019-06-24 NOTE — Telephone Encounter (Signed)
Contacted by Sharyn Lull in [place] with Bilateral LE Venous US results - Negative for DVT. Superficial clot L short saphenous vein/ additional info in Epic. Information given to Dr. Irene Limbo. He will f/u with patient while she is in infusion this afternoon.

## 2019-06-24 NOTE — Patient Instructions (Addendum)
**Dr. Irene Limbo is prescribing Eliquis for the superficial blood clot. Please start Eliquis as soon as you get it from your pharmacy and take twice a day (once in the morning and once in the evening). Resume Revlimid after 48 hours of being on the Eliquis. You will take the Eliquis for 1-3 months--Dr. Irene Limbo will re-evaluate during that time to determine when you'll be decreased to half that dose. Go on light walks throughtout the day to help with blood circulation and apply warm compresses to area of discomfort. Don't hesitate to call if you have any questions or concerns in the meantime :-) **  [facility] Discharge Instructions for Patients Receiving Chemotherapy  Today you received the following chemotherapy agents: Carfilzomib (Kyprolis)  To help prevent nausea and vomiting after your treatment, we encourage you to take your nausea medication as directed.   If you develop nausea and vomiting that is not controlled by your nausea medication, call the clinic.   BELOW ARE SYMPTOMS THAT SHOULD BE REPORTED IMMEDIATELY:  *FEVER GREATER THAN 100.5 F  *CHILLS WITH OR WITHOUT FEVER  NAUSEA AND VOMITING THAT IS NOT CONTROLLED WITH YOUR NAUSEA MEDICATION  *UNUSUAL SHORTNESS OF BREATH  *UNUSUAL BRUISING OR BLEEDING  TENDERNESS IN MOUTH AND THROAT WITH OR WITHOUT PRESENCE OF ULCERS  *URINARY PROBLEMS  *BOWEL PROBLEMS  UNUSUAL RASH Items with * indicate a potential emergency and should be followed up as soon as possible.  Feel free to call the clinic should you have any questions or concerns. The clinic phone number is [phone].  Please show the [place] at check-in to the Emergency Department and triage nurse.  Coronavirus (COVID-19) Are you at risk?  Are you at risk for the Coronavirus (COVID-19)?  To be considered HIGH RISK for Coronavirus (COVID-19), you have to meet the following criteria:  . Traveled to [place], [place], [place], [place] or [place]; or in the  [place] to [place], [place], [place], or [place]; and have fever, cough, and shortness of breath within the last 2 weeks of travel OR . Been in close contact with a person diagnosed with COVID-19 within the last 2 weeks and have fever, cough, and shortness of breath . IF YOU DO NOT MEET THESE CRITERIA, YOU ARE CONSIDERED LOW RISK FOR COVID-19.  What to do if you are HIGH RISK for COVID-19?  Marland Kitchen If you are having a medical emergency, call 911. . Seek medical care right away. Before you go to a doctor's office, urgent care or emergency department, call ahead and tell them about your recent travel, contact with someone diagnosed with COVID-19, and your symptoms. You should receive instructions from your physician's office regarding next steps of care.  . When you arrive at healthcare provider, tell the healthcare staff immediately you have returned from visiting [place], [place], [place], [place] or [place]; or traveled in the [place] to [place], [place], [place], or [place]; in the last two weeks or you have been in close contact with a person diagnosed with COVID-19 in the last 2 weeks.   . Tell the health care staff about your symptoms: fever, cough and shortness of breath. . After you have been seen by a medical provider, you will be either: o Tested for (COVID-19) and discharged home on quarantine except to seek medical care if symptoms worsen, and asked to  - Stay home and avoid contact with others until you get your results (4-5 days)  -  Avoid travel on public transportation if possible (such as bus, train, or airplane) or o Sent to the Emergency Department by EMS for evaluation, COVID-19 testing, and possible admission depending on your condition and test results.  What to do if you are LOW RISK for COVID-19?  Reduce your risk of any infection by using the same precautions used for avoiding the common cold or flu:  Marland Kitchen Wash your hands often with soap and warm water  for at least 20 seconds.  If soap and water are not readily available, use an alcohol-based hand sanitizer with at least 60% alcohol.  . If coughing or sneezing, cover your mouth and nose by coughing or sneezing into the elbow areas of your shirt or coat, into a tissue or into your sleeve (not your hands). . Avoid shaking hands with others and consider head nods or verbal greetings only. . Avoid touching your eyes, nose, or mouth with unwashed hands.  . Avoid close contact with people who are sick. . Avoid places or events with large numbers of people in one location, like concerts or sporting events. . Carefully consider travel plans you have or are making. . If you are planning any travel outside or inside the [place], visit the CDC's Travelers' Health webpage for the latest health notices. . If you have some symptoms but not all symptoms, continue to monitor at home and seek medical attention if your symptoms worsen. . If you are having a medical emergency, call 911.   [place] / e-Visit: [email or web address]         [facility]: [facility]: [id number]                   [facility]: [place] Discharge Instructions for Patients Receiving Chemotherapy  Today you received the following chemotherapy agents: Kyprolis   To help prevent nausea and vomiting after your treatment, we encourage you to take your nausea medication as directed.    If you develop nausea and vomiting that is not controlled by your nausea medication, call the clinic.   BELOW ARE SYMPTOMS THAT SHOULD BE REPORTED IMMEDIATELY:  *FEVER GREATER THAN 100.5 F  *CHILLS WITH OR WITHOUT FEVER  NAUSEA AND VOMITING THAT IS NOT CONTROLLED WITH YOUR NAUSEA MEDICATION  *UNUSUAL SHORTNESS OF BREATH  *UNUSUAL BRUISING OR BLEEDING  TENDERNESS IN  MOUTH AND THROAT WITH OR WITHOUT PRESENCE OF ULCERS  *URINARY PROBLEMS  *BOWEL PROBLEMS  UNUSUAL RASH Items with * indicate a potential emergency and should be followed up as soon as possible.  Feel free to call the clinic should you have any questions or concerns. The clinic phone number is [phone].  Please show the [place] at check-in to the Emergency Department and triage nurse.

## 2019-06-30 ENCOUNTER — Inpatient Hospital Stay: Payer: Medicare Other

## 2019-06-30 ENCOUNTER — Other Ambulatory Visit: Payer: Self-pay

## 2019-06-30 VITALS — BP 123/89 | HR 72 | Temp 98.5°F | Resp 18

## 2019-06-30 DIAGNOSIS — Z95828 Presence of other vascular implants and grafts: Secondary | ICD-10-CM

## 2019-06-30 DIAGNOSIS — Z5111 Encounter for antineoplastic chemotherapy: Secondary | ICD-10-CM

## 2019-06-30 DIAGNOSIS — Z7189 Other specified counseling: Secondary | ICD-10-CM

## 2019-06-30 DIAGNOSIS — Z5112 Encounter for antineoplastic immunotherapy: Secondary | ICD-10-CM | POA: Diagnosis not present

## 2019-06-30 DIAGNOSIS — G629 Polyneuropathy, unspecified: Secondary | ICD-10-CM | POA: Diagnosis not present

## 2019-06-30 DIAGNOSIS — D649 Anemia, unspecified: Secondary | ICD-10-CM

## 2019-06-30 DIAGNOSIS — C9000 Multiple myeloma not having achieved remission: Secondary | ICD-10-CM

## 2019-06-30 DIAGNOSIS — D709 Neutropenia, unspecified: Secondary | ICD-10-CM | POA: Diagnosis not present

## 2019-06-30 LAB — CBC WITH DIFFERENTIAL/PLATELET
Abs Immature Granulocytes: 0.02 10*3/uL (ref 0.00–0.07)
Basophils Absolute: 0 10*3/uL (ref 0.0–0.1)
Basophils Relative: 1 %
Eosinophils Absolute: 0.2 10*3/uL (ref 0.0–0.5)
Eosinophils Relative: 5 %
HCT: 33.2 % — ABNORMAL LOW (ref 36.0–46.0)
Hemoglobin: 10.5 g/dL — ABNORMAL LOW (ref 12.0–15.0)
Immature Granulocytes: 1 %
Lymphocytes Relative: 44 %
Lymphs Abs: 1.3 10*3/uL (ref 0.7–4.0)
MCH: 32 pg (ref 26.0–34.0)
MCHC: 31.6 g/dL (ref 30.0–36.0)
MCV: 101.2 fL — ABNORMAL HIGH (ref 80.0–100.0)
Monocytes Absolute: 0.4 10*3/uL (ref 0.1–1.0)
Monocytes Relative: 13 %
Neutro Abs: 1 10*3/uL — ABNORMAL LOW (ref 1.7–7.7)
Neutrophils Relative %: 36 %
Platelets: 154 10*3/uL (ref 150–400)
RBC: 3.28 MIL/uL — ABNORMAL LOW (ref 3.87–5.11)
RDW: 14.6 % (ref 11.5–15.5)
WBC: 2.9 10*3/uL — ABNORMAL LOW (ref 4.0–10.5)
nRBC: 1 % — ABNORMAL HIGH (ref 0.0–0.2)

## 2019-06-30 LAB — CMP (CANCER CENTER ONLY)
ALT: 35 U/L (ref 0–44)
AST: 20 U/L (ref 15–41)
Albumin: 3.6 g/dL (ref 3.5–5.0)
Alkaline Phosphatase: 99 U/L (ref 38–126)
Anion gap: 6 (ref 5–15)
BUN: 13 mg/dL (ref 8–23)
CO2: 23 mmol/L (ref 22–32)
Calcium: 8.5 mg/dL — ABNORMAL LOW (ref 8.9–10.3)
Chloride: 111 mmol/L (ref 98–111)
Creatinine: 0.82 mg/dL (ref 0.44–1.00)
GFR, Est AFR Am: >60 mL/min (ref >60)
GFR, Estimated: >60 mL/min (ref >60)
Glucose, Bld: 84 mg/dL (ref 70–99)
Potassium: 3.9 mmol/L (ref 3.5–5.1)
Sodium: 140 mmol/L (ref 135–145)
Total Bilirubin: 1 mg/dL (ref 0.3–1.2)
Total Protein: 6 g/dL — ABNORMAL LOW (ref 6.5–8.1)

## 2019-06-30 MED ORDER — ACETAMINOPHEN 500 MG PO TABS
1000.0000 mg | ORAL_TABLET | Freq: Once | ORAL | Status: AC
  Administered 2019-06-30: 1000 mg via ORAL

## 2019-06-30 MED ORDER — PROCHLORPERAZINE MALEATE 10 MG PO TABS
10.0000 mg | ORAL_TABLET | Freq: Once | ORAL | Status: AC
  Administered 2019-06-30: 10 mg via ORAL

## 2019-06-30 MED ORDER — SODIUM CHLORIDE 0.9% FLUSH
10.0000 mL | INTRAVENOUS | Status: DC | PRN
  Administered 2019-06-30: 10 mL
  Filled 2019-06-30: qty 10

## 2019-06-30 MED ORDER — PROCHLORPERAZINE MALEATE 10 MG PO TABS
ORAL_TABLET | ORAL | Status: AC
  Filled 2019-06-30: qty 1

## 2019-06-30 MED ORDER — DEXTROSE 5 % IV SOLN
29.0000 mg/m2 | Freq: Once | INTRAVENOUS | Status: AC
  Administered 2019-06-30: 60 mg via INTRAVENOUS
  Filled 2019-06-30: qty 30

## 2019-06-30 MED ORDER — SODIUM CHLORIDE 0.9 % IV SOLN
Freq: Once | INTRAVENOUS | Status: DC
  Filled 2019-06-30: qty 250

## 2019-06-30 MED ORDER — ACETAMINOPHEN 500 MG PO TABS
ORAL_TABLET | ORAL | Status: AC
  Filled 2019-06-30: qty 2

## 2019-06-30 MED ORDER — SODIUM CHLORIDE 0.9 % IV SOLN
20.0000 mg | Freq: Once | INTRAVENOUS | Status: AC
  Administered 2019-06-30: 20 mg via INTRAVENOUS
  Filled 2019-06-30: qty 20

## 2019-06-30 MED ORDER — SODIUM CHLORIDE 0.9% FLUSH
10.0000 mL | Freq: Once | INTRAVENOUS | Status: AC
  Administered 2019-06-30: 10 mL
  Filled 2019-06-30: qty 10

## 2019-06-30 MED ORDER — HEPARIN SOD (PORK) LOCK FLUSH 100 UNIT/ML IV SOLN
500.0000 [IU] | Freq: Once | INTRAVENOUS | Status: AC | PRN
  Administered 2019-06-30: 500 [IU]
  Filled 2019-06-30: qty 5

## 2019-06-30 MED ORDER — SODIUM CHLORIDE 0.9 % IV SOLN
Freq: Once | INTRAVENOUS | Status: AC
  Administered 2019-06-30: 13:00:00 via INTRAVENOUS
  Filled 2019-06-30: qty 250

## 2019-06-30 NOTE — Patient Instructions (Signed)
[facility] Discharge Instructions for Patients Receiving Chemotherapy  Today you received the following chemotherapy agents:  Kyprolis  To help prevent nausea and vomiting after your treatment, we encourage you to take your nausea medication as directed.   If you develop nausea and vomiting that is not controlled by your nausea medication, call the clinic.   BELOW ARE SYMPTOMS THAT SHOULD BE REPORTED IMMEDIATELY:  *FEVER GREATER THAN 100.5 F  *CHILLS WITH OR WITHOUT FEVER  NAUSEA AND VOMITING THAT IS NOT CONTROLLED WITH YOUR NAUSEA MEDICATION  *UNUSUAL SHORTNESS OF BREATH  *UNUSUAL BRUISING OR BLEEDING  TENDERNESS IN MOUTH AND THROAT WITH OR WITHOUT PRESENCE OF ULCERS  *URINARY PROBLEMS  *BOWEL PROBLEMS  UNUSUAL RASH Items with * indicate a potential emergency and should be followed up as soon as possible.  Feel free to call the clinic should you have any questions or concerns. The clinic phone number is [phone].  Please show the CHEMO ALERT CARD at check-in to the Emergency Department and triage nurse.   

## 2019-06-30 NOTE — Progress Notes (Signed)
V.O from Dr. Irene Limbo. OK to tx w/ ANC 1.0

## 2019-07-01 ENCOUNTER — Inpatient Hospital Stay: Payer: Medicare Other

## 2019-07-01 ENCOUNTER — Other Ambulatory Visit: Payer: Self-pay

## 2019-07-01 VITALS — BP 133/75 | HR 69 | Temp 98.7°F | Resp 16

## 2019-07-01 DIAGNOSIS — C9000 Multiple myeloma not having achieved remission: Secondary | ICD-10-CM | POA: Diagnosis not present

## 2019-07-01 DIAGNOSIS — G629 Polyneuropathy, unspecified: Secondary | ICD-10-CM | POA: Diagnosis not present

## 2019-07-01 DIAGNOSIS — D709 Neutropenia, unspecified: Secondary | ICD-10-CM | POA: Diagnosis not present

## 2019-07-01 DIAGNOSIS — Z5112 Encounter for antineoplastic immunotherapy: Secondary | ICD-10-CM | POA: Diagnosis not present

## 2019-07-01 DIAGNOSIS — Z7189 Other specified counseling: Secondary | ICD-10-CM

## 2019-07-01 MED ORDER — ACETAMINOPHEN 500 MG PO TABS
ORAL_TABLET | ORAL | Status: AC
  Filled 2019-07-01: qty 2

## 2019-07-01 MED ORDER — PROCHLORPERAZINE MALEATE 10 MG PO TABS
ORAL_TABLET | ORAL | Status: AC
  Filled 2019-07-01: qty 1

## 2019-07-01 MED ORDER — SODIUM CHLORIDE 0.9 % IV SOLN
20.0000 mg | Freq: Once | INTRAVENOUS | Status: AC
  Administered 2019-07-01: 20 mg via INTRAVENOUS
  Filled 2019-07-01: qty 20

## 2019-07-01 MED ORDER — SODIUM CHLORIDE 0.9 % IV SOLN
Freq: Once | INTRAVENOUS | Status: AC
  Administered 2019-07-01: 14:00:00 via INTRAVENOUS
  Filled 2019-07-01: qty 250

## 2019-07-01 MED ORDER — PROCHLORPERAZINE MALEATE 10 MG PO TABS
10.0000 mg | ORAL_TABLET | Freq: Once | ORAL | Status: AC
  Administered 2019-07-01: 10 mg via ORAL

## 2019-07-01 MED ORDER — DEXTROSE 5 % IV SOLN
29.0000 mg/m2 | Freq: Once | INTRAVENOUS | Status: AC
  Administered 2019-07-01: 60 mg via INTRAVENOUS
  Filled 2019-07-01: qty 30

## 2019-07-01 MED ORDER — SODIUM CHLORIDE 0.9 % IV SOLN
Freq: Once | INTRAVENOUS | Status: DC
  Filled 2019-07-01: qty 250

## 2019-07-01 MED ORDER — ACETAMINOPHEN 500 MG PO TABS
1000.0000 mg | ORAL_TABLET | Freq: Once | ORAL | Status: AC
  Administered 2019-07-01: 14:00:00 1000 mg via ORAL

## 2019-07-01 MED ORDER — HEPARIN SOD (PORK) LOCK FLUSH 100 UNIT/ML IV SOLN
500.0000 [IU] | Freq: Once | INTRAVENOUS | Status: AC | PRN
  Administered 2019-07-01: 500 [IU]
  Filled 2019-07-01: qty 5

## 2019-07-01 MED ORDER — SODIUM CHLORIDE 0.9% FLUSH
10.0000 mL | INTRAVENOUS | Status: DC | PRN
  Administered 2019-07-01: 10 mL
  Filled 2019-07-01: qty 10

## 2019-07-01 NOTE — Patient Instructions (Signed)
[facility] Discharge Instructions for Patients Receiving Chemotherapy  Today you received the following chemotherapy agents:  Kyprolis  To help prevent nausea and vomiting after your treatment, we encourage you to take your nausea medication as directed.   If you develop nausea and vomiting that is not controlled by your nausea medication, call the clinic.   BELOW ARE SYMPTOMS THAT SHOULD BE REPORTED IMMEDIATELY:  *FEVER GREATER THAN 100.5 F  *CHILLS WITH OR WITHOUT FEVER  NAUSEA AND VOMITING THAT IS NOT CONTROLLED WITH YOUR NAUSEA MEDICATION  *UNUSUAL SHORTNESS OF BREATH  *UNUSUAL BRUISING OR BLEEDING  TENDERNESS IN MOUTH AND THROAT WITH OR WITHOUT PRESENCE OF ULCERS  *URINARY PROBLEMS  *BOWEL PROBLEMS  UNUSUAL RASH Items with * indicate a potential emergency and should be followed up as soon as possible.  Feel free to call the clinic should you have any questions or concerns. The clinic phone number is [phone].  Please show the CHEMO ALERT CARD at check-in to the Emergency Department and triage nurse.   

## 2019-07-02 ENCOUNTER — Other Ambulatory Visit: Payer: Self-pay | Admitting: *Deleted

## 2019-07-02 DIAGNOSIS — C9000 Multiple myeloma not having achieved remission: Secondary | ICD-10-CM

## 2019-07-02 MED ORDER — LENALIDOMIDE 15 MG PO CAPS: 15.0000 mg | ORAL_CAPSULE | Freq: Every day | ORAL | 0 refills | Status: DC

## 2019-07-02 NOTE — Telephone Encounter (Signed)
Revlimid refilled per Dr.Kale OV note 06/23/2019  Refill escribed to [place], [place] Auth# [id number], 07/02/2019

## 2019-07-07 ENCOUNTER — Inpatient Hospital Stay: Payer: Medicare Other

## 2019-07-07 ENCOUNTER — Other Ambulatory Visit: Payer: Self-pay | Admitting: Hematology

## 2019-07-07 ENCOUNTER — Other Ambulatory Visit: Payer: Self-pay

## 2019-07-07 VITALS — BP 129/73 | HR 69 | Temp 98.7°F | Resp 16

## 2019-07-07 DIAGNOSIS — Z7189 Other specified counseling: Secondary | ICD-10-CM

## 2019-07-07 DIAGNOSIS — D649 Anemia, unspecified: Secondary | ICD-10-CM

## 2019-07-07 DIAGNOSIS — C9000 Multiple myeloma not having achieved remission: Secondary | ICD-10-CM

## 2019-07-07 DIAGNOSIS — D709 Neutropenia, unspecified: Secondary | ICD-10-CM | POA: Diagnosis not present

## 2019-07-07 DIAGNOSIS — Z5112 Encounter for antineoplastic immunotherapy: Secondary | ICD-10-CM | POA: Diagnosis not present

## 2019-07-07 DIAGNOSIS — Z5111 Encounter for antineoplastic chemotherapy: Secondary | ICD-10-CM

## 2019-07-07 DIAGNOSIS — G629 Polyneuropathy, unspecified: Secondary | ICD-10-CM | POA: Diagnosis not present

## 2019-07-07 DIAGNOSIS — Z95828 Presence of other vascular implants and grafts: Secondary | ICD-10-CM

## 2019-07-07 LAB — CBC WITH DIFFERENTIAL/PLATELET
Abs Immature Granulocytes: 0.02 10*3/uL (ref 0.00–0.07)
Basophils Absolute: 0 10*3/uL (ref 0.0–0.1)
Basophils Relative: 0 %
Eosinophils Absolute: 0.3 10*3/uL (ref 0.0–0.5)
Eosinophils Relative: 8 %
HCT: 31.7 % — ABNORMAL LOW (ref 36.0–46.0)
Hemoglobin: 10.3 g/dL — ABNORMAL LOW (ref 12.0–15.0)
Immature Granulocytes: 1 %
Lymphocytes Relative: 35 %
Lymphs Abs: 1.4 10*3/uL (ref 0.7–4.0)
MCH: 32.7 pg (ref 26.0–34.0)
MCHC: 32.5 g/dL (ref 30.0–36.0)
MCV: 100.6 fL — ABNORMAL HIGH (ref 80.0–100.0)
Monocytes Absolute: 0.3 10*3/uL (ref 0.1–1.0)
Monocytes Relative: 9 %
Neutro Abs: 1.9 10*3/uL (ref 1.7–7.7)
Neutrophils Relative %: 47 %
Platelets: 152 10*3/uL (ref 150–400)
RBC: 3.15 MIL/uL — ABNORMAL LOW (ref 3.87–5.11)
RDW: 14.4 % (ref 11.5–15.5)
WBC: 3.9 10*3/uL — ABNORMAL LOW (ref 4.0–10.5)
nRBC: 0.5 % — ABNORMAL HIGH (ref 0.0–0.2)

## 2019-07-07 LAB — CMP (CANCER CENTER ONLY)
ALT: 43 U/L (ref 0–44)
AST: 23 U/L (ref 15–41)
Albumin: 3.5 g/dL (ref 3.5–5.0)
Alkaline Phosphatase: 92 U/L (ref 38–126)
Anion gap: 7 (ref 5–15)
BUN: 9 mg/dL (ref 8–23)
CO2: 24 mmol/L (ref 22–32)
Calcium: 8.7 mg/dL — ABNORMAL LOW (ref 8.9–10.3)
Chloride: 110 mmol/L (ref 98–111)
Creatinine: 0.87 mg/dL (ref 0.44–1.00)
GFR, Est AFR Am: >60 mL/min (ref >60)
GFR, Estimated: >60 mL/min (ref >60)
Glucose, Bld: 85 mg/dL (ref 70–99)
Potassium: 3.7 mmol/L (ref 3.5–5.1)
Sodium: 141 mmol/L (ref 135–145)
Total Bilirubin: 1 mg/dL (ref 0.3–1.2)
Total Protein: 5.9 g/dL — ABNORMAL LOW (ref 6.5–8.1)

## 2019-07-07 MED ORDER — PROCHLORPERAZINE MALEATE 10 MG PO TABS
ORAL_TABLET | ORAL | Status: AC
  Filled 2019-07-07: qty 1

## 2019-07-07 MED ORDER — ACETAMINOPHEN 500 MG PO TABS
1000.0000 mg | ORAL_TABLET | Freq: Once | ORAL | Status: AC
  Administered 2019-07-07: 1000 mg via ORAL

## 2019-07-07 MED ORDER — DEXTROSE 5 % IV SOLN
29.0000 mg/m2 | Freq: Once | INTRAVENOUS | Status: AC
  Administered 2019-07-07: 15:00:00 60 mg via INTRAVENOUS
  Filled 2019-07-07: qty 30

## 2019-07-07 MED ORDER — SODIUM CHLORIDE 0.9 % IV SOLN
Freq: Once | INTRAVENOUS | Status: AC
  Administered 2019-07-07: 14:00:00 via INTRAVENOUS
  Filled 2019-07-07: qty 250

## 2019-07-07 MED ORDER — PROCHLORPERAZINE MALEATE 10 MG PO TABS
10.0000 mg | ORAL_TABLET | Freq: Once | ORAL | Status: AC
  Administered 2019-07-07: 10 mg via ORAL

## 2019-07-07 MED ORDER — SODIUM CHLORIDE 0.9% FLUSH
10.0000 mL | Freq: Once | INTRAVENOUS | Status: AC
  Administered 2019-07-07: 10 mL
  Filled 2019-07-07: qty 10

## 2019-07-07 MED ORDER — SODIUM CHLORIDE 0.9% FLUSH
10.0000 mL | INTRAVENOUS | Status: DC | PRN
  Administered 2019-07-07: 10 mL
  Filled 2019-07-07: qty 10

## 2019-07-07 MED ORDER — SODIUM CHLORIDE 0.9 % IV SOLN
Freq: Once | INTRAVENOUS | Status: AC
  Administered 2019-07-07: 13:00:00 via INTRAVENOUS
  Filled 2019-07-07: qty 250

## 2019-07-07 MED ORDER — SODIUM CHLORIDE 0.9 % IV SOLN
20.0000 mg | Freq: Once | INTRAVENOUS | Status: AC
  Administered 2019-07-07: 20 mg via INTRAVENOUS
  Filled 2019-07-07: qty 2

## 2019-07-07 MED ORDER — HEPARIN SOD (PORK) LOCK FLUSH 100 UNIT/ML IV SOLN
500.0000 [IU] | Freq: Once | INTRAVENOUS | Status: AC | PRN
  Administered 2019-07-07: 16:00:00 500 [IU]
  Filled 2019-07-07: qty 5

## 2019-07-07 MED ORDER — ACETAMINOPHEN 500 MG PO TABS
ORAL_TABLET | ORAL | Status: AC
  Filled 2019-07-07: qty 2

## 2019-07-07 NOTE — Patient Instructions (Signed)

## 2019-07-07 NOTE — Patient Instructions (Signed)
[facility] Discharge Instructions for Patients Receiving Chemotherapy  Today you received the following chemotherapy agents:  Kyprolis  To help prevent nausea and vomiting after your treatment, we encourage you to take your nausea medication as directed.   If you develop nausea and vomiting that is not controlled by your nausea medication, call the clinic.   BELOW ARE SYMPTOMS THAT SHOULD BE REPORTED IMMEDIATELY:  *FEVER GREATER THAN 100.5 F  *CHILLS WITH OR WITHOUT FEVER  NAUSEA AND VOMITING THAT IS NOT CONTROLLED WITH YOUR NAUSEA MEDICATION  *UNUSUAL SHORTNESS OF BREATH  *UNUSUAL BRUISING OR BLEEDING  TENDERNESS IN MOUTH AND THROAT WITH OR WITHOUT PRESENCE OF ULCERS  *URINARY PROBLEMS  *BOWEL PROBLEMS  UNUSUAL RASH Items with * indicate a potential emergency and should be followed up as soon as possible.  Feel free to call the clinic should you have any questions or concerns. The clinic phone number is [phone].  Please show the CHEMO ALERT CARD at check-in to the Emergency Department and triage nurse.   

## 2019-07-08 ENCOUNTER — Other Ambulatory Visit: Payer: Self-pay

## 2019-07-08 ENCOUNTER — Inpatient Hospital Stay: Payer: Medicare Other

## 2019-07-08 VITALS — BP 144/55 | HR 75 | Temp 98.2°F | Resp 17

## 2019-07-08 DIAGNOSIS — D709 Neutropenia, unspecified: Secondary | ICD-10-CM | POA: Diagnosis not present

## 2019-07-08 DIAGNOSIS — Z5112 Encounter for antineoplastic immunotherapy: Secondary | ICD-10-CM | POA: Diagnosis not present

## 2019-07-08 DIAGNOSIS — C9000 Multiple myeloma not having achieved remission: Secondary | ICD-10-CM

## 2019-07-08 DIAGNOSIS — Z7189 Other specified counseling: Secondary | ICD-10-CM

## 2019-07-08 DIAGNOSIS — G629 Polyneuropathy, unspecified: Secondary | ICD-10-CM | POA: Diagnosis not present

## 2019-07-08 MED ORDER — SODIUM CHLORIDE 0.9 % IV SOLN
20.0000 mg | Freq: Once | INTRAVENOUS | Status: AC
  Administered 2019-07-08: 15:00:00 20 mg via INTRAVENOUS
  Filled 2019-07-08: qty 20

## 2019-07-08 MED ORDER — SODIUM CHLORIDE 0.9 % IV SOLN
Freq: Once | INTRAVENOUS | Status: AC
  Administered 2019-07-08: 14:00:00 via INTRAVENOUS
  Filled 2019-07-08: qty 250

## 2019-07-08 MED ORDER — DEXTROSE 5 % IV SOLN
29.0000 mg/m2 | Freq: Once | INTRAVENOUS | Status: AC
  Administered 2019-07-08: 60 mg via INTRAVENOUS
  Filled 2019-07-08: qty 30

## 2019-07-08 MED ORDER — SODIUM CHLORIDE 0.9% FLUSH
10.0000 mL | INTRAVENOUS | Status: DC | PRN
  Administered 2019-07-08: 16:00:00 10 mL
  Filled 2019-07-08: qty 10

## 2019-07-08 MED ORDER — PROCHLORPERAZINE MALEATE 10 MG PO TABS
ORAL_TABLET | ORAL | Status: AC
  Filled 2019-07-08: qty 1

## 2019-07-08 MED ORDER — ACETAMINOPHEN 500 MG PO TABS
1000.0000 mg | ORAL_TABLET | Freq: Once | ORAL | Status: AC
  Administered 2019-07-08: 1000 mg via ORAL

## 2019-07-08 MED ORDER — SODIUM CHLORIDE 0.9 % IV SOLN
Freq: Once | INTRAVENOUS | Status: DC
  Filled 2019-07-08: qty 250

## 2019-07-08 MED ORDER — ACETAMINOPHEN 500 MG PO TABS
ORAL_TABLET | ORAL | Status: AC
  Filled 2019-07-08: qty 2

## 2019-07-08 MED ORDER — PROCHLORPERAZINE MALEATE 10 MG PO TABS
10.0000 mg | ORAL_TABLET | Freq: Once | ORAL | Status: AC
  Administered 2019-07-08: 10 mg via ORAL

## 2019-07-08 MED ORDER — HEPARIN SOD (PORK) LOCK FLUSH 100 UNIT/ML IV SOLN
500.0000 [IU] | Freq: Once | INTRAVENOUS | Status: AC | PRN
  Administered 2019-07-08: 500 [IU]
  Filled 2019-07-08: qty 5

## 2019-07-08 NOTE — Patient Instructions (Signed)
[facility] Discharge Instructions for Patients Receiving Chemotherapy  Today you received the following chemotherapy agents:  Kyprolis  To help prevent nausea and vomiting after your treatment, we encourage you to take your nausea medication as directed.   If you develop nausea and vomiting that is not controlled by your nausea medication, call the clinic.   BELOW ARE SYMPTOMS THAT SHOULD BE REPORTED IMMEDIATELY:  *FEVER GREATER THAN 100.5 F  *CHILLS WITH OR WITHOUT FEVER  NAUSEA AND VOMITING THAT IS NOT CONTROLLED WITH YOUR NAUSEA MEDICATION  *UNUSUAL SHORTNESS OF BREATH  *UNUSUAL BRUISING OR BLEEDING  TENDERNESS IN MOUTH AND THROAT WITH OR WITHOUT PRESENCE OF ULCERS  *URINARY PROBLEMS  *BOWEL PROBLEMS  UNUSUAL RASH Items with * indicate a potential emergency and should be followed up as soon as possible.  Feel free to call the clinic should you have any questions or concerns. The clinic phone number is [phone].  Please show the CHEMO ALERT CARD at check-in to the Emergency Department and triage nurse.   

## 2019-07-21 ENCOUNTER — Other Ambulatory Visit: Payer: Self-pay

## 2019-07-21 ENCOUNTER — Inpatient Hospital Stay: Payer: Medicare Other | Attending: Hematology

## 2019-07-21 ENCOUNTER — Inpatient Hospital Stay: Payer: Medicare Other

## 2019-07-21 ENCOUNTER — Inpatient Hospital Stay (HOSPITAL_BASED_OUTPATIENT_CLINIC_OR_DEPARTMENT_OTHER): Payer: Medicare Other | Admitting: Hematology

## 2019-07-21 VITALS — BP 147/82 | HR 77 | Temp 98.3°F | Resp 18 | Ht 63.0 in | Wt 210.2 lb

## 2019-07-21 DIAGNOSIS — C9000 Multiple myeloma not having achieved remission: Secondary | ICD-10-CM

## 2019-07-21 DIAGNOSIS — G629 Polyneuropathy, unspecified: Secondary | ICD-10-CM | POA: Diagnosis not present

## 2019-07-21 DIAGNOSIS — E039 Hypothyroidism, unspecified: Secondary | ICD-10-CM | POA: Diagnosis not present

## 2019-07-21 DIAGNOSIS — Z5111 Encounter for antineoplastic chemotherapy: Secondary | ICD-10-CM

## 2019-07-21 DIAGNOSIS — I82812 Embolism and thrombosis of superficial veins of left lower extremities: Secondary | ICD-10-CM | POA: Diagnosis not present

## 2019-07-21 DIAGNOSIS — Z95828 Presence of other vascular implants and grafts: Secondary | ICD-10-CM

## 2019-07-21 DIAGNOSIS — Z5112 Encounter for antineoplastic immunotherapy: Secondary | ICD-10-CM | POA: Diagnosis not present

## 2019-07-21 DIAGNOSIS — M8580 Other specified disorders of bone density and structure, unspecified site: Secondary | ICD-10-CM | POA: Insufficient documentation

## 2019-07-21 DIAGNOSIS — D649 Anemia, unspecified: Secondary | ICD-10-CM

## 2019-07-21 DIAGNOSIS — Z7901 Long term (current) use of anticoagulants: Secondary | ICD-10-CM | POA: Diagnosis not present

## 2019-07-21 DIAGNOSIS — Z7189 Other specified counseling: Secondary | ICD-10-CM

## 2019-07-21 LAB — CBC WITH DIFFERENTIAL/PLATELET
Abs Immature Granulocytes: 0.01 10*3/uL (ref 0.00–0.07)
Basophils Absolute: 0 10*3/uL (ref 0.0–0.1)
Basophils Relative: 1 %
Eosinophils Absolute: 0.2 10*3/uL (ref 0.0–0.5)
Eosinophils Relative: 7 %
HCT: 33.3 % — ABNORMAL LOW (ref 36.0–46.0)
Hemoglobin: 10.8 g/dL — ABNORMAL LOW (ref 12.0–15.0)
Immature Granulocytes: 0 %
Lymphocytes Relative: 52 %
Lymphs Abs: 1.6 10*3/uL (ref 0.7–4.0)
MCH: 32 pg (ref 26.0–34.0)
MCHC: 32.4 g/dL (ref 30.0–36.0)
MCV: 98.5 fL (ref 80.0–100.0)
Monocytes Absolute: 0.3 10*3/uL (ref 0.1–1.0)
Monocytes Relative: 9 %
Neutro Abs: 1 10*3/uL — ABNORMAL LOW (ref 1.7–7.7)
Neutrophils Relative %: 31 %
Platelets: 179 10*3/uL (ref 150–400)
RBC: 3.38 MIL/uL — ABNORMAL LOW (ref 3.87–5.11)
RDW: 14.2 % (ref 11.5–15.5)
WBC: 3.1 10*3/uL — ABNORMAL LOW (ref 4.0–10.5)
nRBC: 0 % (ref 0.0–0.2)

## 2019-07-21 LAB — CMP (CANCER CENTER ONLY)
ALT: 38 U/L (ref 0–44)
AST: 24 U/L (ref 15–41)
Albumin: 3.6 g/dL (ref 3.5–5.0)
Alkaline Phosphatase: 106 U/L (ref 38–126)
Anion gap: 10 (ref 5–15)
BUN: 11 mg/dL (ref 8–23)
CO2: 21 mmol/L — ABNORMAL LOW (ref 22–32)
Calcium: 8.8 mg/dL — ABNORMAL LOW (ref 8.9–10.3)
Chloride: 110 mmol/L (ref 98–111)
Creatinine: 0.81 mg/dL (ref 0.44–1.00)
GFR, Est AFR Am: >60 mL/min (ref >60)
GFR, Estimated: >60 mL/min (ref >60)
Glucose, Bld: 84 mg/dL (ref 70–99)
Potassium: 3.7 mmol/L (ref 3.5–5.1)
Sodium: 141 mmol/L (ref 135–145)
Total Bilirubin: 1 mg/dL (ref 0.3–1.2)
Total Protein: 6.2 g/dL — ABNORMAL LOW (ref 6.5–8.1)

## 2019-07-21 MED ORDER — DEXTROSE 5 % IV SOLN
60.0000 mg | Freq: Once | INTRAVENOUS | Status: AC
  Administered 2019-07-21: 60 mg via INTRAVENOUS
  Filled 2019-07-21: qty 30

## 2019-07-21 MED ORDER — ACETAMINOPHEN 500 MG PO TABS
1000.0000 mg | ORAL_TABLET | Freq: Once | ORAL | Status: AC
  Administered 2019-07-21: 16:00:00 1000 mg via ORAL

## 2019-07-21 MED ORDER — PROCHLORPERAZINE MALEATE 10 MG PO TABS
10.0000 mg | ORAL_TABLET | Freq: Once | ORAL | Status: AC
  Administered 2019-07-21: 10 mg via ORAL

## 2019-07-21 MED ORDER — SODIUM CHLORIDE 0.9% FLUSH
10.0000 mL | Freq: Once | INTRAVENOUS | Status: AC
  Administered 2019-07-21: 10 mL
  Filled 2019-07-21: qty 10

## 2019-07-21 MED ORDER — SODIUM CHLORIDE 0.9 % IV SOLN
Freq: Once | INTRAVENOUS | Status: AC
  Administered 2019-07-21: 16:00:00 via INTRAVENOUS
  Filled 2019-07-21: qty 250

## 2019-07-21 MED ORDER — ACETAMINOPHEN 500 MG PO TABS
ORAL_TABLET | ORAL | Status: AC
  Filled 2019-07-21: qty 2

## 2019-07-21 MED ORDER — ACETAMINOPHEN 325 MG PO TABS
ORAL_TABLET | ORAL | Status: AC
  Filled 2019-07-21: qty 2

## 2019-07-21 MED ORDER — SODIUM CHLORIDE 0.9% FLUSH
10.0000 mL | INTRAVENOUS | Status: DC | PRN
  Administered 2019-07-21: 10 mL
  Filled 2019-07-21: qty 10

## 2019-07-21 MED ORDER — SODIUM CHLORIDE 0.9 % IV SOLN
20.0000 mg | Freq: Once | INTRAVENOUS | Status: AC
  Administered 2019-07-21: 16:00:00 20 mg via INTRAVENOUS
  Filled 2019-07-21: qty 20

## 2019-07-21 MED ORDER — PROCHLORPERAZINE MALEATE 10 MG PO TABS
ORAL_TABLET | ORAL | Status: AC
  Filled 2019-07-21: qty 1

## 2019-07-21 MED ORDER — HEPARIN SOD (PORK) LOCK FLUSH 100 UNIT/ML IV SOLN
500.0000 [IU] | Freq: Once | INTRAVENOUS | Status: AC | PRN
  Administered 2019-07-21: 17:00:00 500 [IU]
  Filled 2019-07-21: qty 5

## 2019-07-21 NOTE — Patient Instructions (Signed)

## 2019-07-21 NOTE — Patient Instructions (Signed)
[facility]charge [facility] for Patients Receiving Chemotherapy  Today you received the following chemotherapy agents:  Kyprolis  To help prevent nausea and vomiting after your treatment, we encourage you to take your nausea medication as directed.   If you develop nausea and vomiting that is not controlled by your nausea medication, call the clinic.   BELOW ARE SYMPTOMS THAT SHOULD BE REPORTED IMMEDIATELY:  *FEVER GREATER THAN 100.5 F  *CHILLS WITH OR WITHOUT FEVER  NAUSEA AND VOMITING THAT IS NOT CONTROLLED WITH YOUR NAUSEA MEDICATION  *UNUSUAL SHORTNESS OF BREATH  *UNUSUAL BRUISING OR BLEEDING  TENDERNESS IN MOUTH AND THROAT WITH OR WITHOUT PRESENCE OF ULCERS  *URINARY PROBLEMS  *BOWEL PROBLEMS  UNUSUAL RASH Items with * indicate a potential emergency and should be followed up as soon as possible.  Feel free to call the clinic should you have any questions or concerns. The clinic phone number is [phone].  Please show the CHEMO ALERT CARD at check-in to the Emergency Department and triage nurse.   

## 2019-07-21 NOTE — Progress Notes (Signed)
Ok to tx per note from Dr. Irene Limbo today:  "-Discussed pt labwork today, 07/21/19; all values are WNL except for WBC at 3.1K, RBC at 3.38, Hgb at 10.8, HCT at 33.3, Neutro Abs at 1.0K, CO2 at 21, Calcium at 8.8, Total Protein at 6.2.  Blood chemistries look stable, WBC are up and down.   The pt has no prohibitive toxicities from continuing C4D1 Carfilzomib, Dexamethasone"

## 2019-07-21 NOTE — Progress Notes (Signed)
Marland Kitchen    HEMATOLOGY/ONCOLOGY CLINIC NOTE  Date of Service: 07/21/19     PCP: Marzetta Board MD  CHIEF COMPLAINTS/PURPOSE OF CONSULTATION:  Continued f/u for mx of myeloma   HISTORY OF PRESENTING ILLNESS:   Madison Parker is a wonderful 68 y.o. 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  Patient has a history of smoldering multiple myeloma which she reports she was diagnosed with in 2011 when she was evaluated by a hematologist in [place]. She reports that she presented with low blood counts (low WBC)   and after significant workup she had a bone marrow examination based on which she was told that she has smoldering multiple myeloma. Patient notes that she had a bone survey at that time which was negative. She was monitored there closely and then moved to [facility] in 2015 to stay with her son whose wife was being treated for breast cancer. Patient was following with Dr. Delight Hoh in [place]. She reports not having had any treatment for multiple myeloma.  Her last labs from about a year ago showed a SPEP with no OBSERVED M spike . Serum free light chains showed an elevation of kappa Free light chain 310 and lambda free light chain of 12.7 with an abnormal Kappa/ Lambda ratio of 24.38 ( up from 19 previously). Random urine showed an M protein complement of 44%.  She lives in [place] and requested transfer of care to [place]. She was offered follow-up but [facility] in [place]  But prefers to  follow [place] in [place]. Patient reports her energy levels been stable. She has chronic back pain which has improved since the patient had her spinal surgery in April 2017 (L4-L5 interbody fusion). Patient notes she has had some chronic left hip pain which is somewhat more bothersome with the navy on the lateral aspect of her upper thighs that's painful. She also notes some right hip pain.  Over the last few weeks he notes some upper neck pain especially when bending her neck backwards and tingling numbness in her right upper extremity.  Has been working on losing some weight voluntarily.  No acute other new symptoms.   INTERVAL HISTORY:   Madison Parker returns today for f/u for her smoldering multiple myeloma. She is here today for C4D1 Carfilzomib, Dexamethasone, Revlimid. The patient's last visit with [place] was on 06/23/2019. The pt reports that she is doing well overall.  The pt reports her legs have been feeling better since beginning the Eliquis and the leg swelling has gone. She notes that her taste has been off since beginning Revlimid. Pt has been eating as well as she can and has been trying to stay active. She has been leaning into her faith during this time but has lots of family to talk to. Pt denies any port issues. She does not want a flu shot at this time.   Lab results today (07/21/19) of CBC w/diff and CMP is as follows: all values are WNL except for WBC at 3.1K, RBC at 3.38, Hgb at 10.8, HCT at 33.3, Neutro Abs at 1.0K, CO2 at 21, Calcium at 8.8, Total Protein at 6.2.   On review of systems, pt reports improved leg pain and denies leg swelling, new bone pains, infection issues, tingling/numbness in hands/feet, port issues and any other symptoms.    MEDICAL HISTORY:  Past Medical History:  Diagnosis Date  . Headache   .  Low back pain   . SBO (small bowel obstruction) ([place]) 2010  . Smoldering multiple myeloma (H[facility])   Previous history of hypothyroidism- not currently medications Anxiety Obesity .There is no height or weight on file to calculate BMI. Vitamin D deficiency Smoldering multiple myeloma diagnosed in 2011 Lumbosacral radiculopathy Left hip pain  SURGICAL HISTORY: Past Surgical History:  Procedure Laterality Date  . ABDOMINAL HYSTERECTOMY     complete  . BACK SURGERY     lower at [facility]  . colonscopy  2014  . GASTRIC BYPASS  yrs ago  .  HERNIA REPAIR    . IR IMAGING GUIDED PORT INSERTION  04/20/2019  . KNEE SURGERY  06/04/2009   both knees replaced   . TONSILLECTOMY  age 87  . TOTAL HIP ARTHROPLASTY Left 08/16/2017   Procedure: LEFT TOTAL HIP ARTHROPLASTY ANTERIOR APPROACH;  Surgeon: Dorna Leitz, MD;  Location: [facility];  Service: Orthopedics;  Laterality: Left;  Spinal surgery April 2017  SOCIAL HISTORY: Social History   Socioeconomic History  . Marital status: Married    Spouse name: Not on file  . Number of children: Not on file  . Years of education: Not on file  . Highest education level: Not on file  Occupational History  . Not on file  Social Needs  . Financial resource strain: Not on file  . Food insecurity    Worry: Not on file    Inability: Not on file  . Transportation needs    Medical: Not on file    Non-medical: Not on file  Tobacco Use  . Smoking status: Former Smoker    Packs/day: 0.50    Years: 10.00    Pack years: 5.00    Types: Cigarettes  . Smokeless tobacco: Never Used  . Tobacco comment: quit 1977  Substance and Sexual Activity  . Alcohol use: Yes    Comment: occ  . Drug use: No  . Sexual activity: Not on file  Lifestyle  . Physical activity    Days per week: Not on file    Minutes per session: Not on file  . Stress: Not on file  Relationships  . Social Herbalist on phone: Not on file    Gets together: Not on file    Attends religious service: Not on file    Active member of club or organization: Not on file    Attends meetings of clubs or organizations: Not on file    Relationship status: Not on file  . Intimate partner violence    Fear of current or ex partner: Not on file    Emotionally abused: Not on file    Physically abused: Not on file    Forced sexual activity: Not on file  Other Topics Concern  . Not on file  Social History Narrative  . Not on file  Occasional alcohol use Former smoker and smoked 1 pack per week for about 9 years has since quit.   FAMILY HISTORY: No family history on file.  ALLERGIES:  is allergic to codeine.  MEDICATIONS:  Current Outpatient Medications  Medication Sig Dispense Refill  . acetaminophen (TYLENOL) 650 MG CR tablet Take 650 mg by mouth every 8 (eight) hours as needed for pain.    Marland Kitchen acyclovir (ZOVIRAX) 400 MG tablet Take 1 tablet (400 mg total) by mouth 2 (two) times daily. 180 tablet 0  . apixaban (ELIQUIS) 5 MG TABS tablet Take 1 tablet (5 mg total) by mouth 2 (two) times  daily. 60 tablet 2  . ascorbic acid (VITAMIN C) 500 MG tablet Take 500 mg by mouth daily.    . B Complex-C (B-COMPLEX WITH VITAMIN C) tablet Take 1 tablet by mouth daily.    . ergocalciferol (VITAMIN D2) 1.25 MG (50000 UT) capsule Take 1 capsule (50,000 Units total) by mouth once a week. 13 capsule 0  . lenalidomide (REVLIMID) 15 MG capsule Take 1 capsule (15 mg total) by mouth daily. Take for 21 d on, 7 d off, repeat every 28d 21 capsule 0  . lidocaine-prilocaine (EMLA) cream Apply 1 application topically as needed. 30 g 2  . MULTIPLE VITAMIN PO Take 1 tablet by mouth daily.    . ondansetron (ZOFRAN) 8 MG tablet Take 1 tablet (8 mg total) by mouth 2 (two) times daily as needed (Nausea or vomiting). 30 tablet 1  . Potassium 99 MG TABS Take 99 mg by mouth daily.    . prochlorperazine (COMPAZINE) 10 MG tablet Take 1 tablet (10 mg total) by mouth every 6 (six) hours as needed for nausea or vomiting. 30 tablet 0  . prochlorperazine (COMPAZINE) 10 MG tablet Take 1 tablet (10 mg total) by mouth every 6 (six) hours as needed (Nausea or vomiting). 30 tablet 1  . vitamin B-12 (CYANOCOBALAMIN) 1000 MCG tablet Take 5,000 mcg by mouth daily.      No current facility-administered medications for this visit.    Facility-Administered Medications Ordered in Other Visits  Medication Dose Route Frequency Provider Last Rate Last Dose  . sodium chloride flush (NS) 0.9 % injection 10 mL  10 mL Intracatheter PRN Brunetta Genera, MD   10 mL at  05/11/19 1437    REVIEW OF SYSTEMS:    A 10+ POINT REVIEW OF SYSTEMS WAS OBTAINED including neurology, dermatology, psychiatry, cardiac, respiratory, lymph, extremities, GI, GU, Musculoskeletal, constitutional, breasts, reproductive, HEENT.  All pertinent positives are noted in the HPI.  All others are negative.   PHYSICAL EXAMINATION: ECOG PERFORMANCE STATUS: 1 - Symptomatic but completely ambulatory  Vitals:   07/21/19 1433  BP: (!) 147/82  Pulse: 77  Resp: 18  Temp: 98.3 F (36.8 C)  SpO2: 100%   Filed Weights   07/21/19 1433  Weight: 210 lb 3.2 oz (95.3 kg)   .Body mass index is 37.24 kg/m.   Exam was given in a chair   GENERAL:alert, in no acute distress and comfortable SKIN: no acute rashes, no significant lesions EYES: conjunctiva are pink and non-injected, sclera anicteric OROPHARYNX: MMM, no exudates, no oropharyngeal erythema or ulceration NECK: supple, no JVD LYMPH:  no palpable lymphadenopathy in the cervical, axillary or inguinal regions LUNGS: clear to auscultation b/l with normal respiratory effort HEART: regular rate & rhythm ABDOMEN:  normoactive bowel sounds , non tender, not distended. No palpable hepatosplenomegaly.  Extremity: no pedal edema  PSYCH: alert & oriented x 3 with fluent speech NEURO: no focal motor/sensory deficits  LABORATORY DATA:  I have reviewed the data as listed  . CBC Latest Ref Rng & Units 07/21/2019 07/07/2019 06/30/2019  WBC 4.0 - 10.5 K/uL 3.1(L) 3.9(L) 2.9(L)  Hemoglobin 12.0 - 15.0 g/dL 10.8(L) 10.3(L) 10.5(L)  Hematocrit 36.0 - 46.0 % 33.3(L) 31.7(L) 33.2(L)  Platelets 150 - 400 K/uL 179 152 154  ANC 1300 . CBC    Component Value Date/Time   WBC 3.1 (L) 07/21/2019 1423   RBC 3.38 (L) 07/21/2019 1423   HGB 10.8 (L) 07/21/2019 1423   HGB 9.4 (L) 05/04/2019 1137   HGB  11.3 (L) 09/30/2017 1145   HCT 33.3 (L) 07/21/2019 1423   HCT 35.1 09/30/2017 1145   PLT 179 07/21/2019 1423   PLT 126 (L) 05/04/2019 1137   PLT 172  09/30/2017 1145   MCV 98.5 07/21/2019 1423   MCV 97.5 09/30/2017 1145   MCH 32.0 07/21/2019 1423   MCHC 32.4 07/21/2019 1423   RDW 14.2 07/21/2019 1423   RDW 14.0 09/30/2017 1145   LYMPHSABS 1.6 07/21/2019 1423   LYMPHSABS 1.3 09/30/2017 1145   MONOABS 0.3 07/21/2019 1423   MONOABS 0.2 09/30/2017 1145   EOSABS 0.2 07/21/2019 1423   EOSABS 0.0 09/30/2017 1145   EOSABS 0.1 06/03/2014 1003   BASOSABS 0.0 07/21/2019 1423   BASOSABS 0.0 09/30/2017 1145     . CMP Latest Ref Rng & Units 07/21/2019 07/07/2019 06/30/2019  Glucose 70 - 99 mg/dL 84 85 84  BUN 8 - 23 mg/dL '11 9 13  '$ Creatinine 0.44 - 1.00 mg/dL 0.81 0.87 0.82  Sodium 135 - 145 mmol/L 141 141 140  Potassium 3.5 - 5.1 mmol/L 3.7 3.7 3.9  Chloride 98 - 111 mmol/L 110 110 111  CO2 22 - 32 mmol/L 21(L) 24 23  Calcium 8.9 - 10.3 mg/dL 8.8(L) 8.7(L) 8.5(L)  Total Protein 6.5 - 8.1 g/dL 6.2(L) 5.9(L) 6.0(L)  Total Bilirubin 0.3 - 1.2 mg/dL 1.0 1.0 1.0  Alkaline Phos 38 - 126 U/L 106 92 99  AST 15 - 41 U/L '24 23 20  '$ ALT 0 - 44 U/L 38 43 35         Component     Latest Ref Rng & Units 11/08/2016  LDH     125 - 245 U/L 220  Beta 2     0.6 - 2.4 mg/L 2.5 (H)   Component     Latest Ref Rng & Units 07/30/2017  Ferritin     9 - 269 ng/ml 81  Vitamin B12     232 - 1,245 pg/mL 594    03/11/19 BM Bx:   03/11/19 Cytogenetics:       RADIOGRAPHIC STUDIES: I have personally reviewed the radiological images as listed and agreed with the findings in the report. Vas [place] Lower Extremity Venous (dvt)  Result Date: 06/24/2019  Lower Venous Study Indications: Pain, and Swelling. Other Indications: Multiple myeloma on Revlimid. Comparison Study: No previous study. Performing Technologist: Maudry Mayhew MHA, RDMS, RVT, RDCS  Examination Guidelines: A complete evaluation includes B-mode imaging, spectral Doppler, color Doppler, and power Doppler as needed of all accessible portions of each vessel. Bilateral testing is considered  an integral part of a complete examination. Limited examinations for reoccurring indications may be performed as noted. The reflux portion of the exam is performed with the patient in reverse Trendelenburg.  +---------+---------------+---------+-----------+----------+--------------+ RIGHT    CompressibilityPhasicitySpontaneityPropertiesThrombus Aging +---------+---------------+---------+-----------+----------+--------------+ CFV      Full           Yes      Yes                                 +---------+---------------+---------+-----------+----------+--------------+ SFJ      Full                                                        +---------+---------------+---------+-----------+----------+--------------+  FV Prox  Full                                                        +---------+---------------+---------+-----------+----------+--------------+ FV Mid   Full                                                        +---------+---------------+---------+-----------+----------+--------------+ FV DistalFull                                                        +---------+---------------+---------+-----------+----------+--------------+ PFV      Full                                                        +---------+---------------+---------+-----------+----------+--------------+ POP      Full           Yes      Yes                                 +---------+---------------+---------+-----------+----------+--------------+ PTV      Full                                                        +---------+---------------+---------+-----------+----------+--------------+ PERO     Full                                                        +---------+---------------+---------+-----------+----------+--------------+   +---------+---------------+---------+-----------+----------+--------------+ LEFT      CompressibilityPhasicitySpontaneityPropertiesThrombus Aging +---------+---------------+---------+-----------+----------+--------------+ CFV      Full           Yes      Yes                                 +---------+---------------+---------+-----------+----------+--------------+ SFJ      Full                                                        +---------+---------------+---------+-----------+----------+--------------+ FV Prox  Full                                                        +---------+---------------+---------+-----------+----------+--------------+  FV Mid   Full                                                        +---------+---------------+---------+-----------+----------+--------------+ FV DistalFull                                                        +---------+---------------+---------+-----------+----------+--------------+ PFV      Full                                                        +---------+---------------+---------+-----------+----------+--------------+ POP      Full           Yes      Yes                                 +---------+---------------+---------+-----------+----------+--------------+ PTV      Full                                                        +---------+---------------+---------+-----------+----------+--------------+ SSV      None                                         Acute          +---------+---------------+---------+-----------+----------+--------------+ Unable to visualize left peroneal veins.  Summary: Right: There is no evidence of deep vein thrombosis in the lower extremity. No cystic structure found in the popliteal fossa. Left: Findings consistent with acute superficial vein thrombosis involving the left small saphenous vein. There is no evidence of deep vein thrombosis in the lower extremity. However, portions of this examination were limited- see technologist comments above. No  cystic structure found in the popliteal fossa.  *See table(s) above for measurements and observations. Electronically signed by Curt Jews MD on 06/24/2019 at 4:48:58 PM.    Final      Bone survey 11/08/2016 IMPRESSION: 1. Small lytic lesion in the right scapula. 2. Possible small lytic lesion in the left scapula. 3. Postoperative changes. 4. Significant degenerative changes in both hips, left greater than right. 5. No evidence for acute fracture   Electronically Signed   By: Nolon Nations M.D.   On: 11/08/2016 16:19   ASSESSMENT & PLAN:   68 y.o. very pleasant lady with history of   1) Multiple myeloma (concern for small lytic lesions in left and right scapulae) - (appears light chain producing) and Progressive anemia  This was apparently diagnosed as smoldering myeloma in 2011 by her hematologist in [place].  No renal failure/hypercalcemia.  Bone survey with concern for possible small lytic lesions in B/L scapulae. PET/CT scan on 03/12/2017 showed no concerning bone lesions.  Initial Bm Bx with 17% kappa restricted plasma cells consistent with plasma cell neoplasm.  No treatment so far.  03/11/19 BM Bx revealed involvement by 50% plasma cells; genetics -high risk t(14;16), previous genetics from April 2018 BM Bx were standard risk  03/16/19 MRI Lumbar reveals several explanations for her lower back pain including L2-L3 stenosis, but reassuringly, there was not evidence of involvement by multiple myeloma  04/01/19 PET/CT which revealed no FDG evidence of active multiple myeloma within the skeleton. No evidence of lytic lesions within the skeleton or soft tissue Plasmacytoma.  05/07/2019 DXA scan revealed osteopenia, T-score of -1.2.  2) Chronic leukopenia - ? Related to SMM vs other nutritional deficiencies (h/o gastric bypass surgery put her at risk for nutritional deficiencies)  B12 levels WNL  Ferritin adequate  ?additional factor -recent NSAID use.   ?element of benign ethnic neutropenia.  Has not had any issues with frequent infection.   ANC post-operative normalized today at 1700  4) Neuropathy/radiculopathy -Continue follow up with orthopedics  5) Superficial Venous Thrombosis of the left small saphenous vein  06/24/2019 US venous left : Right: There is no evidence of deep vein thrombosis in the lower extremity. No cystic structure found in the popliteal fossa. Left: Findings consistent with acute superficial vein thrombosis involving the left small saphenous vein. There is no evidence of deep vein thrombosis in the lower extremity. However, portions of this examination were limited- see technologist comments above. No cystic structure found in the popliteal fossa  PLAN:  -Discussed pt labwork today, 07/21/19; all values are WNL except for WBC at 3.1K, RBC at 3.38, Hgb at 10.8, HCT at 33.3, Neutro Abs at 1.0K, CO2 at 21, Calcium at 8.8, Total Protein at 6.2.  Blood chemistries look stable, WBC are up and down.  -Discussed 06/23/2019 K/L light chains is as follows: Kappa free light chain at 38.1, Lamda free light chains at 15.7, K/L light chain ratio at 2.43 -Given patient is on Revlimid will treat SVT with therapeutic anticoagulation with Eliquis and hold ASA. -Between 4-6 cycles, depending on labs we will repeat scans and re-evaluate Tx. Maintenance vs transplant. -The pt has no prohibitive toxicities from continuing C4D1 Carfilzomib, Dexamethasone, Revlimid at this time -Recommend seasonal flu shot, pt does not want a flu shot  -Will refer to Dr. Norma Fredrickson at [facility] for transplant consult  -Pt is not interested in transplant at this time but is willing to have a consult   -Discussed maintenance treatments (Revlimid, Carfilzomib)  -Continue Acyclovir -Continue Aspirin -Continued Vitamin D 2000 mg po daily -Will see back in 1 month with labs     FOLLOW UP: Please schedule C5 of treatment Labs on D1 D8 and D15 of each cycle  RTC with Dr Irene Limbo on C5D1   The total time spent in the appt was 10mnutes and more than 50% was on counseling and direct patient cares.  All of the patient's questions were answered with apparent satisfaction. The patient knows to call the clinic with any problems, questions or concerns.    GSullivan LoneMD MPort WashingtonAAHIVMS SSelect Specialty Hospital - Daytona BeachCRice Medical CenterHematology/Oncology Physician CSouthwest Georgia Regional [facility] Center (Office):       [phone](Work cell):  [phone](Fax):           [phone] I, JYevette Edwards am acting as a scribe for Dr. GSullivan Lone   .I have reviewed the above documentation for accuracy and completeness, and I agree with the above. .Brunetta GeneraMD

## 2019-07-22 ENCOUNTER — Inpatient Hospital Stay: Payer: Medicare Other

## 2019-07-22 ENCOUNTER — Other Ambulatory Visit: Payer: Self-pay

## 2019-07-22 VITALS — BP 138/87 | HR 75 | Temp 98.5°F | Resp 20

## 2019-07-22 DIAGNOSIS — M8580 Other specified disorders of bone density and structure, unspecified site: Secondary | ICD-10-CM | POA: Diagnosis not present

## 2019-07-22 DIAGNOSIS — Z5112 Encounter for antineoplastic immunotherapy: Secondary | ICD-10-CM | POA: Diagnosis not present

## 2019-07-22 DIAGNOSIS — G629 Polyneuropathy, unspecified: Secondary | ICD-10-CM | POA: Diagnosis not present

## 2019-07-22 DIAGNOSIS — Z7189 Other specified counseling: Secondary | ICD-10-CM

## 2019-07-22 DIAGNOSIS — C9000 Multiple myeloma not having achieved remission: Secondary | ICD-10-CM | POA: Diagnosis not present

## 2019-07-22 DIAGNOSIS — I82812 Embolism and thrombosis of superficial veins of left lower extremities: Secondary | ICD-10-CM | POA: Diagnosis not present

## 2019-07-22 DIAGNOSIS — E039 Hypothyroidism, unspecified: Secondary | ICD-10-CM | POA: Diagnosis not present

## 2019-07-22 MED ORDER — DEXTROSE 5 % IV SOLN
29.0000 mg/m2 | Freq: Once | INTRAVENOUS | Status: AC
  Administered 2019-07-22: 60 mg via INTRAVENOUS
  Filled 2019-07-22: qty 30

## 2019-07-22 MED ORDER — HEPARIN SOD (PORK) LOCK FLUSH 100 UNIT/ML IV SOLN
500.0000 [IU] | Freq: Once | INTRAVENOUS | Status: AC | PRN
  Administered 2019-07-22: 500 [IU]
  Filled 2019-07-22: qty 5

## 2019-07-22 MED ORDER — SODIUM CHLORIDE 0.9 % IV SOLN
20.0000 mg | Freq: Once | INTRAVENOUS | Status: AC
  Administered 2019-07-22: 20 mg via INTRAVENOUS
  Filled 2019-07-22: qty 20

## 2019-07-22 MED ORDER — SODIUM CHLORIDE 0.9 % IV SOLN
Freq: Once | INTRAVENOUS | Status: AC
  Administered 2019-07-22: 14:00:00 via INTRAVENOUS
  Filled 2019-07-22: qty 250

## 2019-07-22 MED ORDER — ACETAMINOPHEN 325 MG PO TABS
ORAL_TABLET | ORAL | Status: AC
  Filled 2019-07-22: qty 2

## 2019-07-22 MED ORDER — SODIUM CHLORIDE 0.9% FLUSH
10.0000 mL | INTRAVENOUS | Status: DC | PRN
  Administered 2019-07-22: 10 mL
  Filled 2019-07-22: qty 10

## 2019-07-22 MED ORDER — PROCHLORPERAZINE MALEATE 10 MG PO TABS
10.0000 mg | ORAL_TABLET | Freq: Once | ORAL | Status: AC
  Administered 2019-07-22: 14:00:00 10 mg via ORAL

## 2019-07-22 MED ORDER — ACETAMINOPHEN 500 MG PO TABS
ORAL_TABLET | ORAL | Status: AC
  Filled 2019-07-22: qty 2

## 2019-07-22 MED ORDER — SODIUM CHLORIDE 0.9 % IV SOLN
Freq: Once | INTRAVENOUS | Status: AC
  Administered 2019-07-22: 15:00:00 via INTRAVENOUS
  Filled 2019-07-22: qty 250

## 2019-07-22 MED ORDER — PROCHLORPERAZINE MALEATE 10 MG PO TABS
ORAL_TABLET | ORAL | Status: AC
  Filled 2019-07-22: qty 1

## 2019-07-22 MED ORDER — ACETAMINOPHEN 500 MG PO TABS
1000.0000 mg | ORAL_TABLET | Freq: Once | ORAL | Status: AC
  Administered 2019-07-22: 1000 mg via ORAL

## 2019-07-22 NOTE — Patient Instructions (Signed)
[facility] Discharge Instructions for Patients Receiving Chemotherapy  Today you received the following chemotherapy agents:  Kyprolis  To help prevent nausea and vomiting after your treatment, we encourage you to take your nausea medication as directed.   If you develop nausea and vomiting that is not controlled by your nausea medication, call the clinic.   BELOW ARE SYMPTOMS THAT SHOULD BE REPORTED IMMEDIATELY:  *FEVER GREATER THAN 100.5 F  *CHILLS WITH OR WITHOUT FEVER  NAUSEA AND VOMITING THAT IS NOT CONTROLLED WITH YOUR NAUSEA MEDICATION  *UNUSUAL SHORTNESS OF BREATH  *UNUSUAL BRUISING OR BLEEDING  TENDERNESS IN MOUTH AND THROAT WITH OR WITHOUT PRESENCE OF ULCERS  *URINARY PROBLEMS  *BOWEL PROBLEMS  UNUSUAL RASH Items with * indicate a potential emergency and should be followed up as soon as possible.  Feel free to call the clinic should you have any questions or concerns. The clinic phone number is [phone].  Please show the CHEMO ALERT CARD at check-in to the Emergency Department and triage nurse.   

## 2019-07-23 ENCOUNTER — Telehealth: Payer: Self-pay | Admitting: *Deleted

## 2019-07-23 NOTE — Telephone Encounter (Signed)
Per Dr.Kale's directions - contacted Dr. Rossie Muskrat office at [facility] Hematology/Oncology [phone] for referral. Gave information to Tiffany/Christina. They will contact patient to schedule appt

## 2019-07-24 ENCOUNTER — Telehealth: Payer: Self-pay | Admitting: *Deleted

## 2019-07-24 NOTE — Telephone Encounter (Signed)
Received call from [place] @ [facility]. Pt has been scheduled for an appt with Dr. Tiana Loft on 08/10/19 @ 1pm Pt aware.

## 2019-07-27 ENCOUNTER — Other Ambulatory Visit: Payer: Self-pay | Admitting: *Deleted

## 2019-07-27 DIAGNOSIS — I82812 Embolism and thrombosis of superficial veins of left lower extremities: Secondary | ICD-10-CM

## 2019-07-27 MED ORDER — APIXABAN 5 MG PO TABS: 5.0000 mg | ORAL_TABLET | Freq: Two times a day (BID) | ORAL | 2 refills | Status: DC

## 2019-07-28 ENCOUNTER — Other Ambulatory Visit: Payer: Self-pay

## 2019-07-28 ENCOUNTER — Inpatient Hospital Stay: Payer: Medicare Other

## 2019-07-28 VITALS — BP 130/65 | HR 77 | Temp 97.8°F | Resp 18

## 2019-07-28 DIAGNOSIS — I82812 Embolism and thrombosis of superficial veins of left lower extremities: Secondary | ICD-10-CM | POA: Diagnosis not present

## 2019-07-28 DIAGNOSIS — Z95828 Presence of other vascular implants and grafts: Secondary | ICD-10-CM

## 2019-07-28 DIAGNOSIS — E039 Hypothyroidism, unspecified: Secondary | ICD-10-CM | POA: Diagnosis not present

## 2019-07-28 DIAGNOSIS — M8580 Other specified disorders of bone density and structure, unspecified site: Secondary | ICD-10-CM | POA: Diagnosis not present

## 2019-07-28 DIAGNOSIS — C9000 Multiple myeloma not having achieved remission: Secondary | ICD-10-CM | POA: Diagnosis not present

## 2019-07-28 DIAGNOSIS — Z7189 Other specified counseling: Secondary | ICD-10-CM

## 2019-07-28 DIAGNOSIS — Z5111 Encounter for antineoplastic chemotherapy: Secondary | ICD-10-CM

## 2019-07-28 DIAGNOSIS — Z5112 Encounter for antineoplastic immunotherapy: Secondary | ICD-10-CM | POA: Diagnosis not present

## 2019-07-28 DIAGNOSIS — G629 Polyneuropathy, unspecified: Secondary | ICD-10-CM | POA: Diagnosis not present

## 2019-07-28 LAB — CBC WITH DIFFERENTIAL/PLATELET
Abs Immature Granulocytes: 0.02 10*3/uL (ref 0.00–0.07)
Basophils Absolute: 0 10*3/uL (ref 0.0–0.1)
Basophils Relative: 1 %
Eosinophils Absolute: 0.1 10*3/uL (ref 0.0–0.5)
Eosinophils Relative: 3 %
HCT: 32.2 % — ABNORMAL LOW (ref 36.0–46.0)
Hemoglobin: 10.6 g/dL — ABNORMAL LOW (ref 12.0–15.0)
Immature Granulocytes: 1 %
Lymphocytes Relative: 39 %
Lymphs Abs: 1.3 10*3/uL (ref 0.7–4.0)
MCH: 32.5 pg (ref 26.0–34.0)
MCHC: 32.9 g/dL (ref 30.0–36.0)
MCV: 98.8 fL (ref 80.0–100.0)
Monocytes Absolute: 0.6 10*3/uL (ref 0.1–1.0)
Monocytes Relative: 17 %
Neutro Abs: 1.3 10*3/uL — ABNORMAL LOW (ref 1.7–7.7)
Neutrophils Relative %: 39 %
Platelets: 144 10*3/uL — ABNORMAL LOW (ref 150–400)
RBC: 3.26 MIL/uL — ABNORMAL LOW (ref 3.87–5.11)
RDW: 14.4 % (ref 11.5–15.5)
WBC: 3.3 10*3/uL — ABNORMAL LOW (ref 4.0–10.5)
nRBC: 0 % (ref 0.0–0.2)

## 2019-07-28 LAB — CMP (CANCER CENTER ONLY)
ALT: 26 U/L (ref 0–44)
AST: 16 U/L (ref 15–41)
Albumin: 3.3 g/dL — ABNORMAL LOW (ref 3.5–5.0)
Alkaline Phosphatase: 87 U/L (ref 38–126)
Anion gap: 8 (ref 5–15)
BUN: 10 mg/dL (ref 8–23)
CO2: 23 mmol/L (ref 22–32)
Calcium: 8.3 mg/dL — ABNORMAL LOW (ref 8.9–10.3)
Chloride: 111 mmol/L (ref 98–111)
Creatinine: 0.77 mg/dL (ref 0.44–1.00)
GFR, Est AFR Am: >60 mL/min (ref >60)
GFR, Estimated: >60 mL/min (ref >60)
Glucose, Bld: 83 mg/dL (ref 70–99)
Potassium: 3.7 mmol/L (ref 3.5–5.1)
Sodium: 142 mmol/L (ref 135–145)
Total Bilirubin: 1.3 mg/dL — ABNORMAL HIGH (ref 0.3–1.2)
Total Protein: 5.7 g/dL — ABNORMAL LOW (ref 6.5–8.1)

## 2019-07-28 MED ORDER — ACETAMINOPHEN 500 MG PO TABS
ORAL_TABLET | ORAL | Status: AC
  Filled 2019-07-28: qty 2

## 2019-07-28 MED ORDER — PROCHLORPERAZINE MALEATE 10 MG PO TABS
10.0000 mg | ORAL_TABLET | Freq: Once | ORAL | Status: AC
  Administered 2019-07-28: 10 mg via ORAL

## 2019-07-28 MED ORDER — DEXTROSE 5 % IV SOLN
29.0000 mg/m2 | Freq: Once | INTRAVENOUS | Status: AC
  Administered 2019-07-28: 60 mg via INTRAVENOUS
  Filled 2019-07-28: qty 30

## 2019-07-28 MED ORDER — PROCHLORPERAZINE MALEATE 10 MG PO TABS
ORAL_TABLET | ORAL | Status: AC
  Filled 2019-07-28: qty 1

## 2019-07-28 MED ORDER — SODIUM CHLORIDE 0.9% FLUSH
10.0000 mL | Freq: Once | INTRAVENOUS | Status: AC
  Administered 2019-07-28: 10 mL
  Filled 2019-07-28: qty 10

## 2019-07-28 MED ORDER — SODIUM CHLORIDE 0.9 % IV SOLN
Freq: Once | INTRAVENOUS | Status: AC
  Administered 2019-07-28: 14:00:00 via INTRAVENOUS
  Filled 2019-07-28: qty 250

## 2019-07-28 MED ORDER — ACETAMINOPHEN 500 MG PO TABS
1000.0000 mg | ORAL_TABLET | Freq: Once | ORAL | Status: AC
  Administered 2019-07-28: 1000 mg via ORAL

## 2019-07-28 MED ORDER — SODIUM CHLORIDE 0.9% FLUSH
10.0000 mL | INTRAVENOUS | Status: DC | PRN
  Administered 2019-07-28: 10 mL
  Filled 2019-07-28: qty 10

## 2019-07-28 MED ORDER — SODIUM CHLORIDE 0.9 % IV SOLN
20.0000 mg | Freq: Once | INTRAVENOUS | Status: AC
  Administered 2019-07-28: 20 mg via INTRAVENOUS
  Filled 2019-07-28: qty 20

## 2019-07-28 MED ORDER — SODIUM CHLORIDE 0.9 % IV SOLN
Freq: Once | INTRAVENOUS | Status: DC
  Filled 2019-07-28: qty 250

## 2019-07-28 MED ORDER — HEPARIN SOD (PORK) LOCK FLUSH 100 UNIT/ML IV SOLN
500.0000 [IU] | Freq: Once | INTRAVENOUS | Status: AC | PRN
  Administered 2019-07-28: 500 [IU]
  Filled 2019-07-28: qty 5

## 2019-07-28 NOTE — Progress Notes (Signed)
Per Dr. Irene Limbo ok to treat with ANC of 1.3 today.

## 2019-07-29 ENCOUNTER — Inpatient Hospital Stay: Payer: Medicare Other

## 2019-07-29 ENCOUNTER — Other Ambulatory Visit: Payer: Self-pay

## 2019-07-29 VITALS — BP 125/65 | HR 84 | Temp 98.3°F | Resp 18

## 2019-07-29 DIAGNOSIS — E039 Hypothyroidism, unspecified: Secondary | ICD-10-CM | POA: Diagnosis not present

## 2019-07-29 DIAGNOSIS — C9000 Multiple myeloma not having achieved remission: Secondary | ICD-10-CM | POA: Diagnosis not present

## 2019-07-29 DIAGNOSIS — I82812 Embolism and thrombosis of superficial veins of left lower extremities: Secondary | ICD-10-CM | POA: Diagnosis not present

## 2019-07-29 DIAGNOSIS — Z5112 Encounter for antineoplastic immunotherapy: Secondary | ICD-10-CM | POA: Diagnosis not present

## 2019-07-29 DIAGNOSIS — M8580 Other specified disorders of bone density and structure, unspecified site: Secondary | ICD-10-CM | POA: Diagnosis not present

## 2019-07-29 DIAGNOSIS — G629 Polyneuropathy, unspecified: Secondary | ICD-10-CM | POA: Diagnosis not present

## 2019-07-29 DIAGNOSIS — Z7189 Other specified counseling: Secondary | ICD-10-CM

## 2019-07-29 LAB — KAPPA/LAMBDA LIGHT CHAINS
Kappa free light chain: 13.9 mg/L (ref 3.3–19.4)
Kappa, lambda light chain ratio: 1.3 (ref 0.26–1.65)
Lambda free light chains: 10.7 mg/L (ref 5.7–26.3)

## 2019-07-29 MED ORDER — SODIUM CHLORIDE 0.9 % IV SOLN
Freq: Once | INTRAVENOUS | Status: AC
  Administered 2019-07-29: 15:00:00 via INTRAVENOUS
  Filled 2019-07-29: qty 250

## 2019-07-29 MED ORDER — ACETAMINOPHEN 500 MG PO TABS
ORAL_TABLET | ORAL | Status: AC
  Filled 2019-07-29: qty 2

## 2019-07-29 MED ORDER — PROCHLORPERAZINE MALEATE 10 MG PO TABS
10.0000 mg | ORAL_TABLET | Freq: Once | ORAL | Status: AC
  Administered 2019-07-29: 10 mg via ORAL

## 2019-07-29 MED ORDER — HEPARIN SOD (PORK) LOCK FLUSH 100 UNIT/ML IV SOLN
500.0000 [IU] | Freq: Once | INTRAVENOUS | Status: AC | PRN
  Administered 2019-07-29: 500 [IU]
  Filled 2019-07-29: qty 5

## 2019-07-29 MED ORDER — SODIUM CHLORIDE 0.9 % IV SOLN
20.0000 mg | Freq: Once | INTRAVENOUS | Status: AC
  Administered 2019-07-29: 20 mg via INTRAVENOUS
  Filled 2019-07-29: qty 2

## 2019-07-29 MED ORDER — SODIUM CHLORIDE 0.9% FLUSH
10.0000 mL | INTRAVENOUS | Status: DC | PRN
  Administered 2019-07-29: 10 mL
  Filled 2019-07-29: qty 10

## 2019-07-29 MED ORDER — PROCHLORPERAZINE MALEATE 10 MG PO TABS
ORAL_TABLET | ORAL | Status: AC
  Filled 2019-07-29: qty 1

## 2019-07-29 MED ORDER — ACETAMINOPHEN 500 MG PO TABS
1000.0000 mg | ORAL_TABLET | Freq: Once | ORAL | Status: AC
  Administered 2019-07-29: 1000 mg via ORAL

## 2019-07-29 MED ORDER — DEXTROSE 5 % IV SOLN
29.0000 mg/m2 | Freq: Once | INTRAVENOUS | Status: AC
  Administered 2019-07-29: 60 mg via INTRAVENOUS
  Filled 2019-07-29: qty 30

## 2019-07-29 MED ORDER — SODIUM CHLORIDE 0.9 % IV SOLN
Freq: Once | INTRAVENOUS | Status: AC
  Administered 2019-07-29: 14:00:00 via INTRAVENOUS
  Filled 2019-07-29: qty 250

## 2019-07-29 NOTE — Patient Instructions (Signed)
[facility] Discharge Instructions for Patients Receiving Chemotherapy  Today you received the following chemotherapy agents:  Kyprolis  To help prevent nausea and vomiting after your treatment, we encourage you to take your nausea medication as directed.   If you develop nausea and vomiting that is not controlled by your nausea medication, call the clinic.   BELOW ARE SYMPTOMS THAT SHOULD BE REPORTED IMMEDIATELY:  *FEVER GREATER THAN 100.5 F  *CHILLS WITH OR WITHOUT FEVER  NAUSEA AND VOMITING THAT IS NOT CONTROLLED WITH YOUR NAUSEA MEDICATION  *UNUSUAL SHORTNESS OF BREATH  *UNUSUAL BRUISING OR BLEEDING  TENDERNESS IN MOUTH AND THROAT WITH OR WITHOUT PRESENCE OF ULCERS  *URINARY PROBLEMS  *BOWEL PROBLEMS  UNUSUAL RASH Items with * indicate a potential emergency and should be followed up as soon as possible.  Feel free to call the clinic should you have any questions or concerns. The clinic phone number is [phone].  Please show the CHEMO ALERT CARD at check-in to the Emergency Department and triage nurse.   

## 2019-07-31 LAB — MULTIPLE MYELOMA PANEL, SERUM
Albumin SerPl Elph-Mcnc: 3.4 g/dL (ref 2.9–4.4)
Albumin/Glob SerPl: 1.8 — ABNORMAL HIGH (ref 0.7–1.7)
Alpha 1: 0.2 g/dL (ref 0.0–0.4)
Alpha2 Glob SerPl Elph-Mcnc: 0.5 g/dL (ref 0.4–1.0)
B-Globulin SerPl Elph-Mcnc: 0.8 g/dL (ref 0.7–1.3)
Gamma Glob SerPl Elph-Mcnc: 0.4 g/dL (ref 0.4–1.8)
Globulin, Total: 1.9 g/dL — ABNORMAL LOW (ref 2.2–3.9)
IgA: 45 mg/dL — ABNORMAL LOW (ref 87–352)
IgG (Immunoglobin G), Serum: 571 mg/dL — ABNORMAL LOW (ref 586–1602)
IgM (Immunoglobulin M), Srm: 25 mg/dL — ABNORMAL LOW (ref 26–217)
Total Protein ELP: 5.3 g/dL — ABNORMAL LOW (ref 6.0–8.5)

## 2019-08-01 ENCOUNTER — Other Ambulatory Visit: Payer: Self-pay | Admitting: Hematology

## 2019-08-04 ENCOUNTER — Other Ambulatory Visit: Payer: Self-pay

## 2019-08-04 ENCOUNTER — Inpatient Hospital Stay: Payer: Medicare Other

## 2019-08-04 VITALS — BP 119/74 | HR 77 | Temp 98.3°F | Resp 18 | Wt 213.8 lb

## 2019-08-04 DIAGNOSIS — E039 Hypothyroidism, unspecified: Secondary | ICD-10-CM | POA: Diagnosis not present

## 2019-08-04 DIAGNOSIS — G629 Polyneuropathy, unspecified: Secondary | ICD-10-CM | POA: Diagnosis not present

## 2019-08-04 DIAGNOSIS — Z5112 Encounter for antineoplastic immunotherapy: Secondary | ICD-10-CM | POA: Diagnosis not present

## 2019-08-04 DIAGNOSIS — Z7189 Other specified counseling: Secondary | ICD-10-CM

## 2019-08-04 DIAGNOSIS — I82812 Embolism and thrombosis of superficial veins of left lower extremities: Secondary | ICD-10-CM | POA: Diagnosis not present

## 2019-08-04 DIAGNOSIS — C9000 Multiple myeloma not having achieved remission: Secondary | ICD-10-CM

## 2019-08-04 DIAGNOSIS — M8580 Other specified disorders of bone density and structure, unspecified site: Secondary | ICD-10-CM | POA: Diagnosis not present

## 2019-08-04 LAB — CBC WITH DIFFERENTIAL/PLATELET
Abs Immature Granulocytes: 0.02 10*3/uL (ref 0.00–0.07)
Basophils Absolute: 0 10*3/uL (ref 0.0–0.1)
Basophils Relative: 1 %
Eosinophils Absolute: 0.2 10*3/uL (ref 0.0–0.5)
Eosinophils Relative: 6 %
HCT: 33.1 % — ABNORMAL LOW (ref 36.0–46.0)
Hemoglobin: 10.8 g/dL — ABNORMAL LOW (ref 12.0–15.0)
Immature Granulocytes: 1 %
Lymphocytes Relative: 32 %
Lymphs Abs: 1.2 10*3/uL (ref 0.7–4.0)
MCH: 32.3 pg (ref 26.0–34.0)
MCHC: 32.6 g/dL (ref 30.0–36.0)
MCV: 99.1 fL (ref 80.0–100.0)
Monocytes Absolute: 0.3 10*3/uL (ref 0.1–1.0)
Monocytes Relative: 8 %
Neutro Abs: 1.9 10*3/uL (ref 1.7–7.7)
Neutrophils Relative %: 52 %
Platelets: 116 10*3/uL — ABNORMAL LOW (ref 150–400)
RBC: 3.34 MIL/uL — ABNORMAL LOW (ref 3.87–5.11)
RDW: 14.6 % (ref 11.5–15.5)
WBC: 3.7 10*3/uL — ABNORMAL LOW (ref 4.0–10.5)
nRBC: 0.5 % — ABNORMAL HIGH (ref 0.0–0.2)

## 2019-08-04 LAB — CMP (CANCER CENTER ONLY)
ALT: 31 U/L (ref 0–44)
AST: 20 U/L (ref 15–41)
Albumin: 3.3 g/dL — ABNORMAL LOW (ref 3.5–5.0)
Alkaline Phosphatase: 88 U/L (ref 38–126)
Anion gap: 10 (ref 5–15)
BUN: 10 mg/dL (ref 8–23)
CO2: 22 mmol/L (ref 22–32)
Calcium: 8.6 mg/dL — ABNORMAL LOW (ref 8.9–10.3)
Chloride: 110 mmol/L (ref 98–111)
Creatinine: 0.77 mg/dL (ref 0.44–1.00)
GFR, Est AFR Am: >60 mL/min (ref >60)
GFR, Estimated: >60 mL/min (ref >60)
Glucose, Bld: 84 mg/dL (ref 70–99)
Potassium: 3.8 mmol/L (ref 3.5–5.1)
Sodium: 142 mmol/L (ref 135–145)
Total Bilirubin: 1.2 mg/dL (ref 0.3–1.2)
Total Protein: 5.8 g/dL — ABNORMAL LOW (ref 6.5–8.1)

## 2019-08-04 MED ORDER — PROCHLORPERAZINE MALEATE 10 MG PO TABS
10.0000 mg | ORAL_TABLET | Freq: Once | ORAL | Status: AC
  Administered 2019-08-04: 10 mg via ORAL

## 2019-08-04 MED ORDER — SODIUM CHLORIDE 0.9 % IV SOLN
Freq: Once | INTRAVENOUS | Status: AC
  Administered 2019-08-04: 14:00:00 via INTRAVENOUS
  Filled 2019-08-04: qty 250

## 2019-08-04 MED ORDER — SODIUM CHLORIDE 0.9% FLUSH
10.0000 mL | INTRAVENOUS | Status: DC | PRN
  Administered 2019-08-04: 10 mL
  Filled 2019-08-04: qty 10

## 2019-08-04 MED ORDER — SODIUM CHLORIDE 0.9 % IV SOLN
20.0000 mg | Freq: Once | INTRAVENOUS | Status: AC
  Administered 2019-08-04: 20 mg via INTRAVENOUS
  Filled 2019-08-04: qty 20

## 2019-08-04 MED ORDER — ACETAMINOPHEN 500 MG PO TABS
1000.0000 mg | ORAL_TABLET | Freq: Once | ORAL | Status: AC
  Administered 2019-08-04: 1000 mg via ORAL

## 2019-08-04 MED ORDER — HEPARIN SOD (PORK) LOCK FLUSH 100 UNIT/ML IV SOLN
500.0000 [IU] | Freq: Once | INTRAVENOUS | Status: AC | PRN
  Administered 2019-08-04: 500 [IU]
  Filled 2019-08-04: qty 5

## 2019-08-04 MED ORDER — ACETAMINOPHEN 500 MG PO TABS
ORAL_TABLET | ORAL | Status: AC
  Filled 2019-08-04: qty 2

## 2019-08-04 MED ORDER — DEXTROSE 5 % IV SOLN
29.0000 mg/m2 | Freq: Once | INTRAVENOUS | Status: AC
  Administered 2019-08-04: 60 mg via INTRAVENOUS
  Filled 2019-08-04: qty 30

## 2019-08-04 MED ORDER — PROCHLORPERAZINE MALEATE 10 MG PO TABS
ORAL_TABLET | ORAL | Status: AC
  Filled 2019-08-04: qty 1

## 2019-08-04 MED ORDER — SODIUM CHLORIDE 0.9 % IV SOLN
Freq: Once | INTRAVENOUS | Status: AC
  Administered 2019-08-04: 13:00:00 via INTRAVENOUS
  Filled 2019-08-04: qty 250

## 2019-08-04 NOTE — Patient Instructions (Signed)
[facility] Discharge Instructions for Patients Receiving Chemotherapy  Today you received the following chemotherapy agents Carfilzomib (KYPROLIS).  To help prevent nausea and vomiting after your treatment, we encourage you to take your nausea medication as prescribed.   If you develop nausea and vomiting that is not controlled by your nausea medication, call the clinic.   BELOW ARE SYMPTOMS THAT SHOULD BE REPORTED IMMEDIATELY:  *FEVER GREATER THAN 100.5 F  *CHILLS WITH OR WITHOUT FEVER  NAUSEA AND VOMITING THAT IS NOT CONTROLLED WITH YOUR NAUSEA MEDICATION  *UNUSUAL SHORTNESS OF BREATH  *UNUSUAL BRUISING OR BLEEDING  TENDERNESS IN MOUTH AND THROAT WITH OR WITHOUT PRESENCE OF ULCERS  *URINARY PROBLEMS  *BOWEL PROBLEMS  UNUSUAL RASH Items with * indicate a potential emergency and should be followed up as soon as possible.  Feel free to call the clinic should you have any questions or concerns. The clinic phone number is [phone].  Please show the CHEMO ALERT CARD at check-in to the Emergency Department and triage nurse.  Coronavirus (COVID-19) Are you at risk?  Are you at risk for the Coronavirus (COVID-19)?  To be considered HIGH RISK for Coronavirus (COVID-19), you have to meet the following criteria:  . Traveled to [place], [place], [place], [place] or [place]; or in the [place] to [place], [place], [place], or [place]; and have fever, cough, and shortness of breath within the last 2 weeks of travel OR . Been in close contact with a person diagnosed with COVID-19 within the last 2 weeks and have fever, cough, and shortness of breath . IF YOU DO NOT MEET THESE CRITERIA, YOU ARE CONSIDERED LOW RISK FOR COVID-19.  What to do if you are HIGH RISK for COVID-19?  . If you are having a medical emergency, call 911. . Seek medical care right away. Before you go to a doctor's office, urgent care or emergency department, call ahead and tell them  about your recent travel, contact with someone diagnosed with COVID-19, and your symptoms. You should receive instructions from your physician's office regarding next steps of care.  . When you arrive at healthcare provider, tell the healthcare staff immediately you have returned from visiting [place], [place], [place], [place] or [place]; or traveled in the [place] to [place], [place], [place], or [place]; in the last two weeks or you have been in close contact with a person diagnosed with COVID-19 in the last 2 weeks.   . Tell the health care staff about your symptoms: fever, cough and shortness of breath. . After you have been seen by a medical provider, you will be either: o Tested for (COVID-19) and discharged home on quarantine except to seek medical care if symptoms worsen, and asked to  - Stay home and avoid contact with others until you get your results (4-5 days)  - Avoid travel on public transportation if possible (such as bus, train, or airplane) or o Sent to the Emergency Department by EMS for evaluation, COVID-19 testing, and possible admission depending on your condition and test results.  What to do if you are LOW RISK for COVID-19?  Reduce your risk of any infection by using the same precautions used for avoiding the common cold or flu:  . Wash your hands often with soap and warm water for at least 20 seconds.  If soap and water are not readily available, use an alcohol-based hand sanitizer with at least 60% alcohol.  . If coughing or   sneezing, cover your mouth and nose by coughing or sneezing into the elbow areas of your shirt or coat, into a tissue or into your sleeve (not your hands). . Avoid shaking hands with others and consider head nods or verbal greetings only. . Avoid touching your eyes, nose, or mouth with unwashed hands.  . Avoid close contact with people who are sick. . Avoid places or events with large numbers of people in one location, like concerts or  sporting events. . Carefully consider travel plans you have or are making. . If you are planning any travel outside or inside the [place], visit the CDC's Travelers' Health webpage for the latest health notices. . If you have some symptoms but not all symptoms, continue to monitor at home and seek medical attention if your symptoms worsen. . If you are having a medical emergency, call 911.   [place] / e-Visit: [email or web address]         [facility]: [facility]: [phone]                   [facility]: [phone]

## 2019-08-05 ENCOUNTER — Inpatient Hospital Stay: Payer: Medicare Other

## 2019-08-05 ENCOUNTER — Other Ambulatory Visit: Payer: Self-pay

## 2019-08-05 VITALS — BP 124/67 | HR 81 | Temp 98.2°F | Resp 18

## 2019-08-05 DIAGNOSIS — Z7189 Other specified counseling: Secondary | ICD-10-CM

## 2019-08-05 DIAGNOSIS — E039 Hypothyroidism, unspecified: Secondary | ICD-10-CM | POA: Diagnosis not present

## 2019-08-05 DIAGNOSIS — G629 Polyneuropathy, unspecified: Secondary | ICD-10-CM | POA: Diagnosis not present

## 2019-08-05 DIAGNOSIS — M8580 Other specified disorders of bone density and structure, unspecified site: Secondary | ICD-10-CM | POA: Diagnosis not present

## 2019-08-05 DIAGNOSIS — C9000 Multiple myeloma not having achieved remission: Secondary | ICD-10-CM

## 2019-08-05 DIAGNOSIS — I82812 Embolism and thrombosis of superficial veins of left lower extremities: Secondary | ICD-10-CM | POA: Diagnosis not present

## 2019-08-05 DIAGNOSIS — Z5112 Encounter for antineoplastic immunotherapy: Secondary | ICD-10-CM | POA: Diagnosis not present

## 2019-08-05 MED ORDER — DEXTROSE 5 % IV SOLN
29.0000 mg/m2 | Freq: Once | INTRAVENOUS | Status: AC
  Administered 2019-08-05: 60 mg via INTRAVENOUS
  Filled 2019-08-05: qty 30

## 2019-08-05 MED ORDER — SODIUM CHLORIDE 0.9 % IV SOLN
20.0000 mg | Freq: Once | INTRAVENOUS | Status: AC
  Administered 2019-08-05: 20 mg via INTRAVENOUS
  Filled 2019-08-05: qty 20

## 2019-08-05 MED ORDER — SODIUM CHLORIDE 0.9 % IV SOLN
Freq: Once | INTRAVENOUS | Status: AC
  Administered 2019-08-05: 13:00:00 via INTRAVENOUS
  Filled 2019-08-05: qty 250

## 2019-08-05 MED ORDER — HEPARIN SOD (PORK) LOCK FLUSH 100 UNIT/ML IV SOLN
500.0000 [IU] | Freq: Once | INTRAVENOUS | Status: AC | PRN
  Administered 2019-08-05: 500 [IU]
  Filled 2019-08-05: qty 5

## 2019-08-05 MED ORDER — ACETAMINOPHEN 500 MG PO TABS
1000.0000 mg | ORAL_TABLET | Freq: Once | ORAL | Status: AC
  Administered 2019-08-05: 1000 mg via ORAL

## 2019-08-05 MED ORDER — PROCHLORPERAZINE MALEATE 10 MG PO TABS
10.0000 mg | ORAL_TABLET | Freq: Once | ORAL | Status: AC
  Administered 2019-08-05: 10 mg via ORAL

## 2019-08-05 MED ORDER — SODIUM CHLORIDE 0.9% FLUSH
10.0000 mL | INTRAVENOUS | Status: DC | PRN
  Administered 2019-08-05: 10 mL
  Filled 2019-08-05: qty 10

## 2019-08-05 MED ORDER — PROCHLORPERAZINE MALEATE 10 MG PO TABS
ORAL_TABLET | ORAL | Status: AC
  Filled 2019-08-05: qty 1

## 2019-08-05 MED ORDER — ACETAMINOPHEN 500 MG PO TABS
ORAL_TABLET | ORAL | Status: AC
  Filled 2019-08-05: qty 2

## 2019-08-10 DIAGNOSIS — C9000 Multiple myeloma not having achieved remission: Secondary | ICD-10-CM | POA: Diagnosis not present

## 2019-08-13 DIAGNOSIS — Z20828 Contact with and (suspected) exposure to other viral communicable diseases: Secondary | ICD-10-CM | POA: Diagnosis not present

## 2019-08-13 DIAGNOSIS — Z01812 Encounter for preprocedural laboratory examination: Secondary | ICD-10-CM | POA: Diagnosis not present

## 2019-08-13 DIAGNOSIS — C9000 Multiple myeloma not having achieved remission: Secondary | ICD-10-CM | POA: Diagnosis not present

## 2019-08-14 ENCOUNTER — Other Ambulatory Visit: Payer: Self-pay | Admitting: Hematology

## 2019-08-14 DIAGNOSIS — C9000 Multiple myeloma not having achieved remission: Secondary | ICD-10-CM

## 2019-08-14 MED ORDER — LENALIDOMIDE 15 MG PO CAPS: 15.0000 mg | ORAL_CAPSULE | Freq: Every day | ORAL | 0 refills | Status: DC

## 2019-08-14 NOTE — Telephone Encounter (Signed)
Revlimid refill per Dr.Kale OV note 07/21/2019 Refill escribed to [place], [place] [facility] [place] Auth# [id number], 08/14/2019

## 2019-08-20 DIAGNOSIS — Z01812 Encounter for preprocedural laboratory examination: Secondary | ICD-10-CM | POA: Diagnosis not present

## 2019-08-20 DIAGNOSIS — C9001 Multiple myeloma in remission: Secondary | ICD-10-CM | POA: Diagnosis not present

## 2019-08-20 DIAGNOSIS — Z01818 Encounter for other preprocedural examination: Secondary | ICD-10-CM | POA: Diagnosis not present

## 2019-08-20 DIAGNOSIS — C9000 Multiple myeloma not having achieved remission: Secondary | ICD-10-CM | POA: Diagnosis not present

## 2019-08-20 DIAGNOSIS — D499 Neoplasm of unspecified behavior of unspecified site: Secondary | ICD-10-CM | POA: Diagnosis not present

## 2019-08-20 DIAGNOSIS — Z79899 Other long term (current) drug therapy: Secondary | ICD-10-CM | POA: Diagnosis not present

## 2019-08-20 DIAGNOSIS — I361 Nonrheumatic tricuspid (valve) insufficiency: Secondary | ICD-10-CM | POA: Diagnosis not present

## 2019-08-20 DIAGNOSIS — Z01810 Encounter for preprocedural cardiovascular examination: Secondary | ICD-10-CM | POA: Diagnosis not present

## 2019-08-20 DIAGNOSIS — R5383 Other fatigue: Secondary | ICD-10-CM | POA: Diagnosis not present

## 2019-08-20 DIAGNOSIS — R531 Weakness: Secondary | ICD-10-CM | POA: Diagnosis not present

## 2019-08-20 DIAGNOSIS — Z01811 Encounter for preprocedural respiratory examination: Secondary | ICD-10-CM | POA: Diagnosis not present

## 2019-08-24 DIAGNOSIS — C9000 Multiple myeloma not having achieved remission: Secondary | ICD-10-CM | POA: Diagnosis not present

## 2019-08-31 DIAGNOSIS — C9000 Multiple myeloma not having achieved remission: Secondary | ICD-10-CM | POA: Diagnosis not present

## 2019-08-31 DIAGNOSIS — Z52011 Autologous donor, stem cells: Secondary | ICD-10-CM | POA: Diagnosis not present

## 2019-08-31 DIAGNOSIS — C9001 Multiple myeloma in remission: Secondary | ICD-10-CM | POA: Diagnosis not present

## 2019-09-03 DIAGNOSIS — C9000 Multiple myeloma not having achieved remission: Secondary | ICD-10-CM | POA: Diagnosis not present

## 2019-09-03 DIAGNOSIS — Z52091 Other blood donor, stem cells: Secondary | ICD-10-CM | POA: Diagnosis not present

## 2019-09-03 DIAGNOSIS — Z52011 Autologous donor, stem cells: Secondary | ICD-10-CM | POA: Diagnosis not present

## 2019-09-03 DIAGNOSIS — I82492 Acute embolism and thrombosis of other specified deep vein of left lower extremity: Secondary | ICD-10-CM | POA: Diagnosis not present

## 2019-09-03 DIAGNOSIS — Z79899 Other long term (current) drug therapy: Secondary | ICD-10-CM | POA: Diagnosis not present

## 2019-09-04 DIAGNOSIS — Z52091 Other blood donor, stem cells: Secondary | ICD-10-CM | POA: Diagnosis not present

## 2019-09-04 DIAGNOSIS — C9000 Multiple myeloma not having achieved remission: Secondary | ICD-10-CM | POA: Diagnosis not present

## 2019-09-05 DIAGNOSIS — C9000 Multiple myeloma not having achieved remission: Secondary | ICD-10-CM | POA: Diagnosis not present

## 2019-09-05 DIAGNOSIS — Z52091 Other blood donor, stem cells: Secondary | ICD-10-CM | POA: Diagnosis not present

## 2019-09-06 DIAGNOSIS — Z52001 Unspecified donor, stem cells: Secondary | ICD-10-CM | POA: Diagnosis not present

## 2019-09-06 DIAGNOSIS — C9000 Multiple myeloma not having achieved remission: Secondary | ICD-10-CM | POA: Diagnosis not present

## 2019-09-07 DIAGNOSIS — C9000 Multiple myeloma not having achieved remission: Secondary | ICD-10-CM | POA: Diagnosis not present

## 2019-09-07 DIAGNOSIS — Z79899 Other long term (current) drug therapy: Secondary | ICD-10-CM | POA: Diagnosis not present

## 2019-09-07 DIAGNOSIS — Z52001 Unspecified donor, stem cells: Secondary | ICD-10-CM | POA: Diagnosis not present

## 2019-09-10 DIAGNOSIS — C9000 Multiple myeloma not having achieved remission: Secondary | ICD-10-CM | POA: Diagnosis not present

## 2019-09-11 DIAGNOSIS — Z20828 Contact with and (suspected) exposure to other viral communicable diseases: Secondary | ICD-10-CM | POA: Diagnosis not present

## 2019-09-11 DIAGNOSIS — C9000 Multiple myeloma not having achieved remission: Secondary | ICD-10-CM | POA: Diagnosis not present

## 2019-09-14 DIAGNOSIS — Z79899 Other long term (current) drug therapy: Secondary | ICD-10-CM | POA: Diagnosis not present

## 2019-09-14 DIAGNOSIS — M545 Low back pain: Secondary | ICD-10-CM | POA: Diagnosis not present

## 2019-09-14 DIAGNOSIS — I82819 Embolism and thrombosis of superficial veins of unspecified lower extremities: Secondary | ICD-10-CM | POA: Diagnosis not present

## 2019-09-14 DIAGNOSIS — D61810 Antineoplastic chemotherapy induced pancytopenia: Secondary | ICD-10-CM | POA: Diagnosis not present

## 2019-09-14 DIAGNOSIS — Z7901 Long term (current) use of anticoagulants: Secondary | ICD-10-CM | POA: Diagnosis not present

## 2019-09-14 DIAGNOSIS — C9000 Multiple myeloma not having achieved remission: Secondary | ICD-10-CM | POA: Diagnosis not present

## 2019-09-14 DIAGNOSIS — T451X5A Adverse effect of antineoplastic and immunosuppressive drugs, initial encounter: Secondary | ICD-10-CM | POA: Diagnosis not present

## 2019-09-14 DIAGNOSIS — G8929 Other chronic pain: Secondary | ICD-10-CM | POA: Diagnosis not present

## 2019-09-14 DIAGNOSIS — C9001 Multiple myeloma in remission: Secondary | ICD-10-CM | POA: Diagnosis not present

## 2019-09-14 DIAGNOSIS — M549 Dorsalgia, unspecified: Secondary | ICD-10-CM | POA: Diagnosis not present

## 2019-09-14 DIAGNOSIS — Z9484 Stem cells transplant status: Secondary | ICD-10-CM | POA: Diagnosis not present

## 2019-09-14 DIAGNOSIS — Z86718 Personal history of other venous thrombosis and embolism: Secondary | ICD-10-CM | POA: Diagnosis not present

## 2019-09-15 DIAGNOSIS — T451X5A Adverse effect of antineoplastic and immunosuppressive drugs, initial encounter: Secondary | ICD-10-CM | POA: Diagnosis not present

## 2019-09-15 DIAGNOSIS — Z79899 Other long term (current) drug therapy: Secondary | ICD-10-CM | POA: Diagnosis not present

## 2019-09-15 DIAGNOSIS — C9000 Multiple myeloma not having achieved remission: Secondary | ICD-10-CM | POA: Diagnosis not present

## 2019-09-15 DIAGNOSIS — Z86718 Personal history of other venous thrombosis and embolism: Secondary | ICD-10-CM | POA: Diagnosis not present

## 2019-09-15 DIAGNOSIS — I82819 Embolism and thrombosis of superficial veins of unspecified lower extremities: Secondary | ICD-10-CM | POA: Diagnosis not present

## 2019-09-15 DIAGNOSIS — Z7901 Long term (current) use of anticoagulants: Secondary | ICD-10-CM | POA: Diagnosis not present

## 2019-09-15 DIAGNOSIS — Z9484 Stem cells transplant status: Secondary | ICD-10-CM | POA: Diagnosis not present

## 2019-09-15 DIAGNOSIS — G8929 Other chronic pain: Secondary | ICD-10-CM | POA: Diagnosis not present

## 2019-09-15 DIAGNOSIS — D61810 Antineoplastic chemotherapy induced pancytopenia: Secondary | ICD-10-CM | POA: Diagnosis not present

## 2019-09-15 DIAGNOSIS — M549 Dorsalgia, unspecified: Secondary | ICD-10-CM | POA: Diagnosis not present

## 2019-09-15 DIAGNOSIS — B0082 Herpes simplex myelitis: Secondary | ICD-10-CM | POA: Diagnosis not present

## 2019-09-15 DIAGNOSIS — M545 Low back pain: Secondary | ICD-10-CM | POA: Diagnosis not present

## 2019-09-16 DIAGNOSIS — Z9484 Stem cells transplant status: Secondary | ICD-10-CM | POA: Diagnosis not present

## 2019-09-16 DIAGNOSIS — Z452 Encounter for adjustment and management of vascular access device: Secondary | ICD-10-CM | POA: Diagnosis not present

## 2019-09-16 DIAGNOSIS — C9000 Multiple myeloma not having achieved remission: Secondary | ICD-10-CM | POA: Diagnosis not present

## 2019-09-17 DIAGNOSIS — I82819 Embolism and thrombosis of superficial veins of unspecified lower extremities: Secondary | ICD-10-CM | POA: Diagnosis not present

## 2019-09-17 DIAGNOSIS — C9000 Multiple myeloma not having achieved remission: Secondary | ICD-10-CM | POA: Diagnosis not present

## 2019-09-17 DIAGNOSIS — Z9484 Stem cells transplant status: Secondary | ICD-10-CM | POA: Diagnosis not present

## 2019-09-17 DIAGNOSIS — Z79899 Other long term (current) drug therapy: Secondary | ICD-10-CM | POA: Diagnosis not present

## 2019-09-17 DIAGNOSIS — M545 Low back pain: Secondary | ICD-10-CM | POA: Diagnosis not present

## 2019-09-17 DIAGNOSIS — T451X5A Adverse effect of antineoplastic and immunosuppressive drugs, initial encounter: Secondary | ICD-10-CM | POA: Diagnosis not present

## 2019-09-17 DIAGNOSIS — B0082 Herpes simplex myelitis: Secondary | ICD-10-CM | POA: Diagnosis not present

## 2019-09-17 DIAGNOSIS — G8929 Other chronic pain: Secondary | ICD-10-CM | POA: Diagnosis not present

## 2019-09-17 DIAGNOSIS — R05 Cough: Secondary | ICD-10-CM | POA: Diagnosis not present

## 2019-09-17 DIAGNOSIS — R110 Nausea: Secondary | ICD-10-CM | POA: Diagnosis not present

## 2019-09-17 DIAGNOSIS — M549 Dorsalgia, unspecified: Secondary | ICD-10-CM | POA: Diagnosis not present

## 2019-09-17 DIAGNOSIS — R5383 Other fatigue: Secondary | ICD-10-CM | POA: Diagnosis not present

## 2019-09-17 DIAGNOSIS — D61810 Antineoplastic chemotherapy induced pancytopenia: Secondary | ICD-10-CM | POA: Diagnosis not present

## 2019-09-17 DIAGNOSIS — Z86718 Personal history of other venous thrombosis and embolism: Secondary | ICD-10-CM | POA: Diagnosis not present

## 2019-09-18 DIAGNOSIS — Z9484 Stem cells transplant status: Secondary | ICD-10-CM | POA: Diagnosis not present

## 2019-09-18 DIAGNOSIS — T451X5A Adverse effect of antineoplastic and immunosuppressive drugs, initial encounter: Secondary | ICD-10-CM | POA: Diagnosis not present

## 2019-09-18 DIAGNOSIS — M5416 Radiculopathy, lumbar region: Secondary | ICD-10-CM | POA: Diagnosis not present

## 2019-09-18 DIAGNOSIS — D61810 Antineoplastic chemotherapy induced pancytopenia: Secondary | ICD-10-CM | POA: Diagnosis not present

## 2019-09-18 DIAGNOSIS — R05 Cough: Secondary | ICD-10-CM | POA: Diagnosis not present

## 2019-09-18 DIAGNOSIS — M25552 Pain in left hip: Secondary | ICD-10-CM | POA: Diagnosis not present

## 2019-09-18 DIAGNOSIS — Z6837 Body mass index (BMI) 37.0-37.9, adult: Secondary | ICD-10-CM | POA: Diagnosis not present

## 2019-09-18 DIAGNOSIS — M47816 Spondylosis without myelopathy or radiculopathy, lumbar region: Secondary | ICD-10-CM | POA: Diagnosis not present

## 2019-09-18 DIAGNOSIS — M899 Disorder of bone, unspecified: Secondary | ICD-10-CM | POA: Diagnosis not present

## 2019-09-18 DIAGNOSIS — R110 Nausea: Secondary | ICD-10-CM | POA: Diagnosis not present

## 2019-09-18 DIAGNOSIS — Z9889 Other specified postprocedural states: Secondary | ICD-10-CM | POA: Diagnosis not present

## 2019-09-18 DIAGNOSIS — M4316 Spondylolisthesis, lumbar region: Secondary | ICD-10-CM | POA: Diagnosis not present

## 2019-09-18 DIAGNOSIS — C9000 Multiple myeloma not having achieved remission: Secondary | ICD-10-CM | POA: Diagnosis not present

## 2019-09-18 DIAGNOSIS — R197 Diarrhea, unspecified: Secondary | ICD-10-CM | POA: Diagnosis not present

## 2019-09-19 DIAGNOSIS — Z7901 Long term (current) use of anticoagulants: Secondary | ICD-10-CM | POA: Diagnosis not present

## 2019-09-19 DIAGNOSIS — Z79899 Other long term (current) drug therapy: Secondary | ICD-10-CM | POA: Diagnosis not present

## 2019-09-19 DIAGNOSIS — M549 Dorsalgia, unspecified: Secondary | ICD-10-CM | POA: Diagnosis not present

## 2019-09-19 DIAGNOSIS — F17220 Nicotine dependence, chewing tobacco, uncomplicated: Secondary | ICD-10-CM | POA: Diagnosis not present

## 2019-09-19 DIAGNOSIS — M545 Low back pain: Secondary | ICD-10-CM | POA: Diagnosis not present

## 2019-09-19 DIAGNOSIS — T451X5A Adverse effect of antineoplastic and immunosuppressive drugs, initial encounter: Secondary | ICD-10-CM | POA: Diagnosis not present

## 2019-09-19 DIAGNOSIS — G8929 Other chronic pain: Secondary | ICD-10-CM | POA: Diagnosis not present

## 2019-09-19 DIAGNOSIS — I82819 Embolism and thrombosis of superficial veins of unspecified lower extremities: Secondary | ICD-10-CM | POA: Diagnosis not present

## 2019-09-19 DIAGNOSIS — D61810 Antineoplastic chemotherapy induced pancytopenia: Secondary | ICD-10-CM | POA: Diagnosis not present

## 2019-09-19 DIAGNOSIS — Z86718 Personal history of other venous thrombosis and embolism: Secondary | ICD-10-CM | POA: Diagnosis not present

## 2019-09-19 DIAGNOSIS — R110 Nausea: Secondary | ICD-10-CM | POA: Diagnosis not present

## 2019-09-19 DIAGNOSIS — C9000 Multiple myeloma not having achieved remission: Secondary | ICD-10-CM | POA: Diagnosis not present

## 2019-09-19 DIAGNOSIS — Z9484 Stem cells transplant status: Secondary | ICD-10-CM | POA: Diagnosis not present

## 2019-09-20 DIAGNOSIS — E876 Hypokalemia: Secondary | ICD-10-CM | POA: Diagnosis not present

## 2019-09-20 DIAGNOSIS — Z96653 Presence of artificial knee joint, bilateral: Secondary | ICD-10-CM | POA: Diagnosis present

## 2019-09-20 DIAGNOSIS — Z8042 Family history of malignant neoplasm of prostate: Secondary | ICD-10-CM | POA: Diagnosis not present

## 2019-09-20 DIAGNOSIS — R627 Adult failure to thrive: Secondary | ICD-10-CM | POA: Diagnosis present

## 2019-09-20 DIAGNOSIS — K1230 Oral mucositis (ulcerative), unspecified: Secondary | ICD-10-CM | POA: Diagnosis present

## 2019-09-20 DIAGNOSIS — D61810 Antineoplastic chemotherapy induced pancytopenia: Secondary | ICD-10-CM | POA: Diagnosis present

## 2019-09-20 DIAGNOSIS — Z9484 Stem cells transplant status: Secondary | ICD-10-CM | POA: Diagnosis not present

## 2019-09-20 DIAGNOSIS — Z9884 Bariatric surgery status: Secondary | ICD-10-CM | POA: Diagnosis not present

## 2019-09-20 DIAGNOSIS — C9000 Multiple myeloma not having achieved remission: Secondary | ICD-10-CM | POA: Diagnosis present

## 2019-09-20 DIAGNOSIS — K529 Noninfective gastroenteritis and colitis, unspecified: Secondary | ICD-10-CM | POA: Diagnosis present

## 2019-09-20 DIAGNOSIS — T865 Complications of stem cell transplant: Secondary | ICD-10-CM | POA: Diagnosis present

## 2019-09-20 DIAGNOSIS — R5081 Fever presenting with conditions classified elsewhere: Secondary | ICD-10-CM | POA: Diagnosis present

## 2019-09-20 DIAGNOSIS — M1990 Unspecified osteoarthritis, unspecified site: Secondary | ICD-10-CM | POA: Diagnosis present

## 2019-09-20 DIAGNOSIS — R918 Other nonspecific abnormal finding of lung field: Secondary | ICD-10-CM | POA: Diagnosis not present

## 2019-09-20 DIAGNOSIS — D709 Neutropenia, unspecified: Secondary | ICD-10-CM | POA: Diagnosis not present

## 2019-09-20 DIAGNOSIS — Z8619 Personal history of other infectious and parasitic diseases: Secondary | ICD-10-CM | POA: Diagnosis not present

## 2019-09-20 DIAGNOSIS — Z20828 Contact with and (suspected) exposure to other viral communicable diseases: Secondary | ICD-10-CM | POA: Diagnosis present

## 2019-09-20 DIAGNOSIS — Z90710 Acquired absence of both cervix and uterus: Secondary | ICD-10-CM | POA: Diagnosis not present

## 2019-09-20 DIAGNOSIS — R112 Nausea with vomiting, unspecified: Secondary | ICD-10-CM | POA: Diagnosis not present

## 2019-09-20 DIAGNOSIS — Z8249 Family history of ischemic heart disease and other diseases of the circulatory system: Secondary | ICD-10-CM | POA: Diagnosis not present

## 2019-09-20 DIAGNOSIS — Z86718 Personal history of other venous thrombosis and embolism: Secondary | ICD-10-CM | POA: Diagnosis not present

## 2019-09-20 DIAGNOSIS — Z87891 Personal history of nicotine dependence: Secondary | ICD-10-CM | POA: Diagnosis not present

## 2019-09-20 DIAGNOSIS — T451X5A Adverse effect of antineoplastic and immunosuppressive drugs, initial encounter: Secondary | ICD-10-CM | POA: Diagnosis present

## 2019-09-20 DIAGNOSIS — R197 Diarrhea, unspecified: Secondary | ICD-10-CM | POA: Diagnosis not present

## 2019-09-22 DIAGNOSIS — R112 Nausea with vomiting, unspecified: Secondary | ICD-10-CM | POA: Diagnosis not present

## 2019-09-22 DIAGNOSIS — C9000 Multiple myeloma not having achieved remission: Secondary | ICD-10-CM | POA: Diagnosis not present

## 2019-09-22 DIAGNOSIS — T451X5A Adverse effect of antineoplastic and immunosuppressive drugs, initial encounter: Secondary | ICD-10-CM | POA: Diagnosis not present

## 2019-09-22 DIAGNOSIS — D61810 Antineoplastic chemotherapy induced pancytopenia: Secondary | ICD-10-CM | POA: Diagnosis not present

## 2019-09-23 DIAGNOSIS — C9000 Multiple myeloma not having achieved remission: Secondary | ICD-10-CM | POA: Diagnosis not present

## 2019-09-23 DIAGNOSIS — Z86718 Personal history of other venous thrombosis and embolism: Secondary | ICD-10-CM | POA: Diagnosis not present

## 2019-09-23 DIAGNOSIS — Z9484 Stem cells transplant status: Secondary | ICD-10-CM | POA: Diagnosis not present

## 2019-09-23 DIAGNOSIS — T865 Complications of stem cell transplant: Secondary | ICD-10-CM | POA: Diagnosis present

## 2019-09-23 DIAGNOSIS — Z8249 Family history of ischemic heart disease and other diseases of the circulatory system: Secondary | ICD-10-CM | POA: Diagnosis not present

## 2019-09-23 DIAGNOSIS — K529 Noninfective gastroenteritis and colitis, unspecified: Secondary | ICD-10-CM | POA: Diagnosis present

## 2019-09-23 DIAGNOSIS — R112 Nausea with vomiting, unspecified: Secondary | ICD-10-CM | POA: Diagnosis not present

## 2019-09-23 DIAGNOSIS — M1990 Unspecified osteoarthritis, unspecified site: Secondary | ICD-10-CM | POA: Diagnosis present

## 2019-09-23 DIAGNOSIS — D61810 Antineoplastic chemotherapy induced pancytopenia: Secondary | ICD-10-CM | POA: Diagnosis not present

## 2019-09-23 DIAGNOSIS — Z90710 Acquired absence of both cervix and uterus: Secondary | ICD-10-CM | POA: Diagnosis not present

## 2019-09-23 DIAGNOSIS — D709 Neutropenia, unspecified: Secondary | ICD-10-CM | POA: Diagnosis not present

## 2019-09-23 DIAGNOSIS — E876 Hypokalemia: Secondary | ICD-10-CM | POA: Diagnosis not present

## 2019-09-23 DIAGNOSIS — R5081 Fever presenting with conditions classified elsewhere: Secondary | ICD-10-CM | POA: Diagnosis not present

## 2019-09-23 DIAGNOSIS — Z8042 Family history of malignant neoplasm of prostate: Secondary | ICD-10-CM | POA: Diagnosis not present

## 2019-09-23 DIAGNOSIS — R918 Other nonspecific abnormal finding of lung field: Secondary | ICD-10-CM | POA: Diagnosis not present

## 2019-09-23 DIAGNOSIS — T451X5A Adverse effect of antineoplastic and immunosuppressive drugs, initial encounter: Secondary | ICD-10-CM | POA: Diagnosis present

## 2019-09-23 DIAGNOSIS — R197 Diarrhea, unspecified: Secondary | ICD-10-CM | POA: Diagnosis not present

## 2019-09-23 DIAGNOSIS — Z9884 Bariatric surgery status: Secondary | ICD-10-CM | POA: Diagnosis not present

## 2019-09-23 DIAGNOSIS — Z96653 Presence of artificial knee joint, bilateral: Secondary | ICD-10-CM | POA: Diagnosis present

## 2019-09-23 DIAGNOSIS — R627 Adult failure to thrive: Secondary | ICD-10-CM | POA: Diagnosis present

## 2019-09-23 DIAGNOSIS — Z87891 Personal history of nicotine dependence: Secondary | ICD-10-CM | POA: Diagnosis not present

## 2019-09-23 DIAGNOSIS — Z8619 Personal history of other infectious and parasitic diseases: Secondary | ICD-10-CM | POA: Diagnosis not present

## 2019-09-23 DIAGNOSIS — Z20828 Contact with and (suspected) exposure to other viral communicable diseases: Secondary | ICD-10-CM | POA: Diagnosis present

## 2019-09-23 DIAGNOSIS — K1230 Oral mucositis (ulcerative), unspecified: Secondary | ICD-10-CM | POA: Diagnosis present

## 2019-09-29 ENCOUNTER — Other Ambulatory Visit: Payer: Self-pay | Admitting: *Deleted

## 2019-09-29 ENCOUNTER — Telehealth: Payer: Self-pay | Admitting: *Deleted

## 2019-09-29 DIAGNOSIS — C9000 Multiple myeloma not having achieved remission: Secondary | ICD-10-CM

## 2019-09-29 NOTE — Telephone Encounter (Signed)
Contacted by Almyra Deforest, RN ([facility]). Patient is being d/c'd from [facility] post transplant 09/29/20. She would like to have appt times to give to patient prior to discharge.  [facility] requested lab appt scheduled for patient on 12/18 for: CBCw/diff; Plt count; CMP; Magnesium.Schedule message sent for labs on 10/01/20  Patient also needs labs and f/u appt with Dr. Irene Limbo for Day 60+ post transplant, will be week of 11/09/19.Gave Ms. [place]ar[place] appt time for patient's appt on 11/12/19: LAB 8:45 am and Dr. Irene Limbo 9:20AM Dr. Irene Limbo gave verbal order for labs on both appts dates - labs ordered.

## 2019-10-02 ENCOUNTER — Other Ambulatory Visit: Payer: Self-pay

## 2019-10-02 ENCOUNTER — Inpatient Hospital Stay: Payer: Medicare Other | Attending: Hematology

## 2019-10-02 DIAGNOSIS — C9000 Multiple myeloma not having achieved remission: Secondary | ICD-10-CM | POA: Diagnosis not present

## 2019-10-02 DIAGNOSIS — Z5111 Encounter for antineoplastic chemotherapy: Secondary | ICD-10-CM

## 2019-10-02 LAB — CMP (CANCER CENTER ONLY)
ALT: 80 U/L — ABNORMAL HIGH (ref 0–44)
AST: 80 U/L — ABNORMAL HIGH (ref 15–41)
Albumin: 3.3 g/dL — ABNORMAL LOW (ref 3.5–5.0)
Alkaline Phosphatase: 101 U/L (ref 38–126)
Anion gap: 9 (ref 5–15)
BUN: 5 mg/dL — ABNORMAL LOW (ref 8–23)
CO2: 22 mmol/L (ref 22–32)
Calcium: 8.3 mg/dL — ABNORMAL LOW (ref 8.9–10.3)
Chloride: 109 mmol/L (ref 98–111)
Creatinine: 0.69 mg/dL (ref 0.44–1.00)
GFR, Est AFR Am: >60 mL/min (ref >60)
GFR, Estimated: >60 mL/min (ref >60)
Glucose, Bld: 108 mg/dL — ABNORMAL HIGH (ref 70–99)
Potassium: 3.2 mmol/L — ABNORMAL LOW (ref 3.5–5.1)
Sodium: 140 mmol/L (ref 135–145)
Total Bilirubin: 0.6 mg/dL (ref 0.3–1.2)
Total Protein: 5.8 g/dL — ABNORMAL LOW (ref 6.5–8.1)

## 2019-10-02 LAB — CBC WITH DIFFERENTIAL (CANCER CENTER ONLY)
Abs Immature Granulocytes: 1.17 10*3/uL — ABNORMAL HIGH (ref 0.00–0.07)
Basophils Absolute: 0 10*3/uL (ref 0.0–0.1)
Basophils Relative: 0 %
Eosinophils Absolute: 0 10*3/uL (ref 0.0–0.5)
Eosinophils Relative: 0 %
HCT: 26.5 % — ABNORMAL LOW (ref 36.0–46.0)
Hemoglobin: 8.7 g/dL — ABNORMAL LOW (ref 12.0–15.0)
Immature Granulocytes: 17 %
Lymphocytes Relative: 26 %
Lymphs Abs: 1.9 10*3/uL (ref 0.7–4.0)
MCH: 30.9 pg (ref 26.0–34.0)
MCHC: 32.8 g/dL (ref 30.0–36.0)
MCV: 94 fL (ref 80.0–100.0)
Monocytes Absolute: 1.3 10*3/uL — ABNORMAL HIGH (ref 0.1–1.0)
Monocytes Relative: 18 %
Neutro Abs: 2.8 10*3/uL (ref 1.7–7.7)
Neutrophils Relative %: 39 %
Platelet Count: 143 10*3/uL — ABNORMAL LOW (ref 150–400)
RBC: 2.82 MIL/uL — ABNORMAL LOW (ref 3.87–5.11)
RDW: 18.4 % — ABNORMAL HIGH (ref 11.5–15.5)
WBC Count: 7.1 10*3/uL (ref 4.0–10.5)
nRBC: 6.8 % — ABNORMAL HIGH (ref 0.0–0.2)

## 2019-10-02 LAB — MAGNESIUM: Magnesium: 1.7 mg/dL (ref 1.7–2.4)

## 2019-10-05 LAB — KAPPA/LAMBDA LIGHT CHAINS
Kappa free light chain: 8.2 mg/L (ref 3.3–19.4)
Kappa, lambda light chain ratio: 0.94 (ref 0.26–1.65)
Lambda free light chains: 8.7 mg/L (ref 5.7–26.3)

## 2019-10-06 LAB — MULTIPLE MYELOMA PANEL, SERUM
Albumin SerPl Elph-Mcnc: 3.3 g/dL (ref 2.9–4.4)
Albumin/Glob SerPl: 1.6 (ref 0.7–1.7)
Alpha 1: 0.3 g/dL (ref 0.0–0.4)
Alpha2 Glob SerPl Elph-Mcnc: 0.5 g/dL (ref 0.4–1.0)
B-Globulin SerPl Elph-Mcnc: 0.9 g/dL (ref 0.7–1.3)
Gamma Glob SerPl Elph-Mcnc: 0.5 g/dL (ref 0.4–1.8)
Globulin, Total: 2.2 g/dL (ref 2.2–3.9)
IgA: 43 mg/dL — ABNORMAL LOW (ref 87–352)
IgG (Immunoglobin G), Serum: 556 mg/dL — ABNORMAL LOW (ref 586–1602)
IgM (Immunoglobulin M), Srm: 20 mg/dL — ABNORMAL LOW (ref 26–217)
Total Protein ELP: 5.5 g/dL — ABNORMAL LOW (ref 6.0–8.5)

## 2019-10-14 DIAGNOSIS — R112 Nausea with vomiting, unspecified: Secondary | ICD-10-CM | POA: Diagnosis not present

## 2019-10-14 DIAGNOSIS — C9000 Multiple myeloma not having achieved remission: Secondary | ICD-10-CM | POA: Diagnosis not present

## 2019-10-14 DIAGNOSIS — Z79899 Other long term (current) drug therapy: Secondary | ICD-10-CM | POA: Diagnosis not present

## 2019-10-14 DIAGNOSIS — Z792 Long term (current) use of antibiotics: Secondary | ICD-10-CM | POA: Diagnosis not present

## 2019-10-14 DIAGNOSIS — E876 Hypokalemia: Secondary | ICD-10-CM | POA: Diagnosis not present

## 2019-10-14 DIAGNOSIS — Z9484 Stem cells transplant status: Secondary | ICD-10-CM | POA: Diagnosis not present

## 2019-10-14 DIAGNOSIS — C9001 Multiple myeloma in remission: Secondary | ICD-10-CM | POA: Diagnosis not present

## 2019-10-15 ENCOUNTER — Telehealth: Payer: Self-pay | Admitting: *Deleted

## 2019-10-15 ENCOUNTER — Other Ambulatory Visit: Payer: Self-pay | Admitting: *Deleted

## 2019-10-15 DIAGNOSIS — C9000 Multiple myeloma not having achieved remission: Secondary | ICD-10-CM

## 2019-10-15 NOTE — Telephone Encounter (Signed)
Received fax from Chyrl Civatte with [facility]. Patient seen 12/31 for Transplant Day +30 in their office and they state K+ low.  Labs CMP and Magnesium requested at [facility] on 10/21/19. Labs ordered. Schedule message sent. Patient contacted with this information and advised to expect a call from scheduling. She verbalized understanding.

## 2019-10-21 ENCOUNTER — Inpatient Hospital Stay: Payer: Medicare Other | Attending: Hematology

## 2019-10-21 ENCOUNTER — Other Ambulatory Visit: Payer: Self-pay

## 2019-10-21 DIAGNOSIS — C9000 Multiple myeloma not having achieved remission: Secondary | ICD-10-CM | POA: Insufficient documentation

## 2019-10-21 LAB — CMP (CANCER CENTER ONLY)
ALT: 36 U/L (ref 0–44)
AST: 34 U/L (ref 15–41)
Albumin: 4 g/dL (ref 3.5–5.0)
Alkaline Phosphatase: 79 U/L (ref 38–126)
Anion gap: 10 (ref 5–15)
BUN: 6 mg/dL — ABNORMAL LOW (ref 8–23)
CO2: 24 mmol/L (ref 22–32)
Calcium: 9.6 mg/dL (ref 8.9–10.3)
Chloride: 113 mmol/L — ABNORMAL HIGH (ref 98–111)
Creatinine: 0.83 mg/dL (ref 0.44–1.00)
GFR, Est AFR Am: >60 mL/min (ref >60)
GFR, Estimated: >60 mL/min (ref >60)
Glucose, Bld: 108 mg/dL — ABNORMAL HIGH (ref 70–99)
Potassium: 5.3 mmol/L — ABNORMAL HIGH (ref 3.5–5.1)
Sodium: 147 mmol/L — ABNORMAL HIGH (ref 135–145)
Total Bilirubin: 0.6 mg/dL (ref 0.3–1.2)
Total Protein: 6.4 g/dL — ABNORMAL LOW (ref 6.5–8.1)

## 2019-10-21 LAB — MAGNESIUM: Magnesium: 1.7 mg/dL (ref 1.7–2.4)

## 2019-10-22 ENCOUNTER — Telehealth: Payer: Self-pay | Admitting: *Deleted

## 2019-10-22 NOTE — Telephone Encounter (Signed)
Faxed CMP & Magnesium results (1/6) to Berdine Addison @ [facility]. [phone]. Fax confirmation received

## 2019-11-11 NOTE — Progress Notes (Signed)
Marland Kitchen    HEMATOLOGY/ONCOLOGY CLINIC NOTE  Date of Service: 11/12/19     PCP: Marzetta Board MD  CHIEF COMPLAINTS/PURPOSE OF CONSULTATION:  Continued f/u for mx of myeloma   HISTORY OF PRESENTING ILLNESS:   Madison Parker is a wonderful 69 y.o. 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  Patient has a history of smoldering multiple myeloma which she reports she was diagnosed with in 2011 when she was evaluated by a hematologist in [place]. She reports that she presented with low blood counts (low WBC)   and after significant workup she had a bone marrow examination based on which she was told that she has smoldering multiple myeloma. Patient notes that she had a bone survey at that time which was negative. She was monitored there closely and then moved to [facility] in 2015 to stay with her son whose wife was being treated for breast cancer. Patient was following with Dr. Delight Hoh in [place]. She reports not having had any treatment for multiple myeloma.  Her last labs from about a year ago showed a SPEP with no OBSERVED M spike . Serum free light chains showed an elevation of kappa Free light chain 310 and lambda free light chain of 12.7 with an abnormal Kappa/ Lambda ratio of 24.38 ( up from 19 previously). Random urine showed an M protein complement of 44%.  She lives in [place] and requested transfer of care to [place]. She was offered follow-up but [facility] in [place]  But prefers to  follow [place] in [place]. Patient reports her energy levels been stable. She has chronic back pain which has improved since the patient had her spinal surgery in April 2017 (L4-L5 interbody fusion). Patient notes she has had some chronic left hip pain which is somewhat more bothersome with the navy on the lateral aspect of her upper thighs that's painful. She also notes some right hip pain.  Over the last few weeks he notes some upper neck pain especially when bending her neck backwards and tingling numbness in her right upper extremity.  Has been working on losing some weight voluntarily.  No acute other new symptoms.  AUTOLOGOUS STEM CELL TRANSPLANT:  Disease status at time of transplant: sCR MRD positive  ASBMT risk stratification: low risk, ECOG/KPS: 0 / 90%, HSCT-CI score: 0  Started mobilization on 09/03/19 using GCS-F and Plerixafor. She collected stem cells on 09/07/19. The pre-processing total was 7.18x10(6) CD34+ cells/kg. The post-processing total was 1.11x10(7) CD34+ cells/kg frozen in 3 bags.  Preparative regimen with Melphalan 200 mg/m2 on 09/14/19 (outpatient).  Infusion of stem cells - 7.4 x 10(6) on 09/15/19 (outpatient)  Admitted Day +8 for neutropenic fever, uncontrolled n/v, diarrhea  Treated with short course of prednisone for peri-engraftment syndrome  TREATMENT:  Started induction therapy with carfilozomib, lenlaidomide, and dexamethasone 28 day cycle on 04/27/2019. She had a great response to initial cycle. She developed a DVT on 06/24/19 and was started on apixaban as an anticoagulant. She had normalization of her light chains by the start of C4 on 07/28/19.  She was referred to the myeloma clinic at [facility] for BMT consult and seen on 08/10/19. Serology from her visit showed no M-spike by SPEP, a free light chain ratio of 1.07 (kappa 11.42, lambda 10.65), and an abnormal beta-2-micro of 3.02. She was started on pre-transplant evaluation while undergoing C5 of therapy.  Treatment discontinued in 07/2019  in preparation for autologous stem cell transplant.   INTERVAL HISTORY:   Madison Parker returns today for f/u of her multiple myeloma . She is s/p HDT -Auto HSCT on 09/15/2019. Reviewed post transplant course with her based on [facility] records.  The pt reports she is doing well since her transplant.  Lab results today (11/12/19) of CBC w/diff and CMP  is as follows: all values are WNL except for RBC at 3.64, Hemoglobin at 11.3, HCT at 34.4, Platelet Count at 149, Neutro Abs 1.3, Chloride at 112, BUN at 7, Total Protein at 6.2.  On review of systems, pt reports no new concerns and denies fevers, chills, diarrhea , mouth sores and any other symptoms.   MEDICAL HISTORY:  Past Medical History:  Diagnosis Date  . Headache   . Low back pain   . SBO (small bowel obstruction) ([place]) 2010  . Smoldering multiple myeloma (H[facility])   Previous history of hypothyroidism- not currently medications Anxiety Obesity .There is no height or weight on file to calculate BMI. Vitamin D deficiency Smoldering multiple myeloma diagnosed in 2011 Lumbosacral radiculopathy Left hip pain  SURGICAL HISTORY: Past Surgical History:  Procedure Laterality Date  . ABDOMINAL HYSTERECTOMY     complete  . BACK SURGERY     lower at [facility]  . colonscopy  2014  . GASTRIC BYPASS  yrs ago  . HERNIA REPAIR    . IR IMAGING GUIDED PORT INSERTION  04/20/2019  . KNEE SURGERY  06/04/2009   both knees replaced   . TONSILLECTOMY  age 50  . TOTAL HIP ARTHROPLASTY Left 08/16/2017   Procedure: LEFT TOTAL HIP ARTHROPLASTY ANTERIOR APPROACH;  Surgeon: Dorna Leitz, MD;  Location: [facility];  Service: Orthopedics;  Laterality: Left;  Spinal surgery April 2017  SOCIAL HISTORY: Social History   Socioeconomic History  . Marital status: Married    Spouse name: Not on file  . Number of children: Not on file  . Years of education: Not on file  . Highest education level: Not on file  Occupational History  . Not on file  Tobacco Use  . Smoking status: Former Smoker    Packs/day: 0.50    Years: 10.00    Pack years: 5.00    Types: Cigarettes  . Smokeless tobacco: Never Used  . Tobacco comment: quit 1977  Substance and Sexual Activity  . Alcohol use: Yes    Comment: occ  . Drug use: No  . Sexual activity: Not on file  Other Topics Concern  . Not on file  Social History Narrative   . Not on file   Social Determinants of Health   Financial Resource Strain:   . Difficulty of Paying Living Expenses: Not on file  Food Insecurity:   . Worried About Charity fundraiser in the Last Year: Not on file  . Ran Out of Food in the Last Year: Not on file  Transportation Needs:   . Lack of Transportation (Medical): Not on file  . Lack of Transportation (Non-Medical): Not on file  Physical Activity:   . Days of Exercise per Week: Not on file  . Minutes of Exercise per Session: Not on file  Stress:   . Feeling of Stress : Not on file  Social Connections:   . Frequency of Communication with Friends and Family: Not on file  . Frequency of Social Gatherings with Friends and Family: Not on file  . Attends Religious Services: Not on file  . Active Member of Clubs  or Organizations: Not on file  . Attends Archivist Meetings: Not on file  . Marital Status: Not on file  Intimate Partner Violence:   . Fear of Current or Ex-Partner: Not on file  . Emotionally Abused: Not on file  . Physically Abused: Not on file  . Sexually Abused: Not on file  Occasional alcohol use Former smoker and smoked 1 pack per week for about 9 years has since quit.  FAMILY HISTORY: No family history on file.  ALLERGIES:  is allergic to codeine.  MEDICATIONS:  Current Outpatient Medications  Medication Sig Dispense Refill  . acetaminophen (TYLENOL) 650 MG CR tablet Take 650 mg by mouth every 8 (eight) hours as needed for pain.    Marland Kitchen acyclovir (ZOVIRAX) 400 MG tablet Take 1 tablet (400 mg total) by mouth 2 (two) times daily. 180 tablet 0  . apixaban (ELIQUIS) 5 MG TABS tablet Take 1 tablet (5 mg total) by mouth 2 (two) times daily. 60 tablet 2  . ascorbic acid (VITAMIN C) 500 MG tablet Take 500 mg by mouth daily.    . B Complex-C (B-COMPLEX WITH VITAMIN C) tablet Take 1 tablet by mouth daily.    Marland Kitchen lenalidomide (REVLIMID) 15 MG capsule Take 1 capsule (15 mg total) by mouth daily. Take for 21  d on, 7 d off, repeat every 28d 21 capsule 0  . lidocaine-prilocaine (EMLA) cream Apply 1 application topically as needed. 30 g 2  . MULTIPLE VITAMIN PO Take 1 tablet by mouth daily.    . ondansetron (ZOFRAN) 8 MG tablet Take 1 tablet (8 mg total) by mouth 2 (two) times daily as needed (Nausea or vomiting). 30 tablet 1  . Potassium 99 MG TABS Take 99 mg by mouth daily.    . prochlorperazine (COMPAZINE) 10 MG tablet Take 1 tablet (10 mg total) by mouth every 6 (six) hours as needed for nausea or vomiting. 30 tablet 0  . prochlorperazine (COMPAZINE) 10 MG tablet Take 1 tablet (10 mg total) by mouth every 6 (six) hours as needed (Nausea or vomiting). 30 tablet 1  . vitamin B-12 (CYANOCOBALAMIN) 1000 MCG tablet Take 5,000 mcg by mouth daily.     . Vitamin D, Ergocalciferol, (DRISDOL) 1.25 MG (50000 UT) CAPS capsule TAKE 1 CAPSULE ONCE A WEEK 13 capsule 3   No current facility-administered medications for this visit.   Facility-Administered Medications Ordered in Other Visits  Medication Dose Route Frequency Provider Last Rate Last Admin  . sodium chloride flush (NS) 0.9 % injection 10 mL  10 mL Intracatheter PRN Brunetta Genera, MD   10 mL at 05/11/19 1437    REVIEW OF SYSTEMS:   A 10+ POINT REVIEW OF SYSTEMS WAS OBTAINED including neurology, dermatology, psychiatry, cardiac, respiratory, lymph, extremities, GI, GU, Musculoskeletal, constitutional, breasts, reproductive, HEENT.  All pertinent positives are noted in the HPI.  All others are negative.   PHYSICAL EXAMINATION: ECOG FS:1 - Symptomatic but completely ambulatory  Vitals:   11/12/19 1021  BP: 120/83  Pulse: 86  Resp: 17  Temp: 97.9 F (36.6 C)  SpO2: 100%   Wt Readings from Last 3 Encounters:  11/12/19 191 lb 1.6 oz (86.7 kg)  08/04/19 213 lb 12 oz (97 kg)  07/21/19 210 lb 3.2 oz (95.3 kg)   Body mass index is 33.85 kg/m.    GENERAL:alert, in no acute distress and comfortable SKIN: no acute rashes, no significant  lesions EYES: conjunctiva are pink and non-injected, sclera anicteric OROPHARYNX: MMM, no  exudates, no oropharyngeal erythema or ulceration NECK: supple, no JVD LYMPH:  no palpable lymphadenopathy in the cervical, axillary or inguinal regions LUNGS: clear to auscultation b/l with normal respiratory effort HEART: regular rate & rhythm ABDOMEN:  normoactive bowel sounds , non tender, not distended. Extremity: no pedal edema PSYCH: alert & oriented x 3 with fluent speech NEURO: no focal motor/sensory deficits   LABORATORY DATA:  I have reviewed the data as listed  . CBC Latest Ref Rng & Units 11/12/2019 10/02/2019 08/04/2019  WBC 4.0 - 10.5 K/uL 4.1 7.1 3.7(L)  Hemoglobin 12.0 - 15.0 g/dL 11.3(L) 8.7(L) 10.8(L)  Hematocrit 36.0 - 46.0 % 34.4(L) 26.5(L) 33.1(L)  Platelets 150 - 400 K/uL 149(L) 143(L) 116(L)  ANC 1300 . CBC    Component Value Date/Time   WBC 4.1 11/12/2019 0836   WBC 3.7 (L) 08/04/2019 1226   RBC 3.64 (L) 11/12/2019 0836   HGB 11.3 (L) 11/12/2019 0836   HGB 11.3 (L) 09/30/2017 1145   HCT 34.4 (L) 11/12/2019 0836   HCT 35.1 09/30/2017 1145   PLT 149 (L) 11/12/2019 0836   PLT 172 09/30/2017 1145   MCV 94.5 11/12/2019 0836   MCV 97.5 09/30/2017 1145   MCH 31.0 11/12/2019 0836   MCHC 32.8 11/12/2019 0836   RDW 14.5 11/12/2019 0836   RDW 14.0 09/30/2017 1145   LYMPHSABS 2.2 11/12/2019 0836   LYMPHSABS 1.3 09/30/2017 1145   MONOABS 0.5 11/12/2019 0836   MONOABS 0.2 09/30/2017 1145   EOSABS 0.1 11/12/2019 0836   EOSABS 0.0 09/30/2017 1145   EOSABS 0.1 06/03/2014 1003   BASOSABS 0.0 11/12/2019 0836   BASOSABS 0.0 09/30/2017 1145     . CMP Latest Ref Rng & Units 11/12/2019 10/21/2019 10/02/2019  Glucose 70 - 99 mg/dL 88 108(H) 108(H)  BUN 8 - 23 mg/dL 7(L) 6(L) 5(L)  Creatinine 0.44 - 1.00 mg/dL 0.88 0.83 0.69  Sodium 135 - 145 mmol/L 142 147(H) 140  Potassium 3.5 - 5.1 mmol/L 4.6 5.3(H) 3.2(L)  Chloride 98 - 111 mmol/L 112(H) 113(H) 109  CO2 22 - 32  mmol/L 22 24 22   Calcium 8.9 - 10.3 mg/dL 9.6 9.6 8.3(L)  Total Protein 6.5 - 8.1 g/dL 6.2(L) 6.4(L) 5.8(L)  Total Bilirubin 0.3 - 1.2 mg/dL 0.7 0.6 0.6  Alkaline Phos 38 - 126 U/L 77 79 101  AST 15 - 41 U/L 20 34 80(H)  ALT 0 - 44 U/L 21 36 80(H)         Component     Latest Ref Rng & Units 11/08/2016  LDH     125 - 245 U/L 220  Beta 2     0.6 - 2.4 mg/L 2.5 (H)   Component     Latest Ref Rng & Units 07/30/2017  Ferritin     9 - 269 ng/ml 81  Vitamin B12     232 - 1,245 pg/mL 594   Magnesium: Component Magnesium  Latest Ref Rng & Units 1.7 - 2.4 mg/dL  10/02/2019 1.7  10/21/2019 1.7  11/12/2019 1.7    03/11/19 BM Bx:   03/11/19 Cytogenetics:       RADIOGRAPHIC STUDIES: I have personally reviewed the radiological images as listed and agreed with the findings in the report. No results found.   Bone survey 11/08/2016 IMPRESSION: 1. Small lytic lesion in the right scapula. 2. Possible small lytic lesion in the left scapula. 3. Postoperative changes. 4. Significant degenerative changes in both hips, left greater than right. 5. No evidence  for acute fracture   Electronically Signed   By: Nolon Nations M.D.   On: 11/08/2016 16:19   ASSESSMENT & PLAN:   69 y.o. very pleasant lady with history of   1) Multiple myeloma (concern for small lytic lesions in left and right scapulae) - (appears light chain producing) and Progressive anemia  This was apparently diagnosed as smoldering myeloma in 2011 by her hematologist in [place].  No renal failure/hypercalcemia.  Bone survey with concern for possible small lytic lesions in B/L scapulae. PET/CT scan on 03/12/2017 showed no concerning bone lesions.  Initial Bm Bx with 17% kappa restricted plasma cells consistent with plasma cell neoplasm.  No treatment so far.  03/11/19 BM Bx revealed involvement by 50% plasma cells; genetics -high risk t(14;16), previous genetics from April 2018 BM Bx were  standard risk  03/16/19 MRI Lumbar reveals several explanations for her lower back pain including L2-L3 stenosis, but reassuringly, there was not evidence of involvement by multiple myeloma  04/01/19 PET/CT which revealed no FDG evidence of active multiple myeloma within the skeleton. No evidence of lytic lesions within the skeleton or soft tissue Plasmacytoma.  05/07/2019 DXA scan revealed osteopenia, T-score of -1.2.  2) Chronic leukopenia - ? Related to SMM vs other nutritional deficiencies (h/o gastric bypass surgery put her at risk for nutritional deficiencies)  B12 levels WNL  Ferritin adequate  ?additional factor -recent NSAID use.  ?element of benign ethnic neutropenia.  Has not had any issues with frequent infection.   ANC post-operative normalized today at 1700  4) Neuropathy/radiculopathy -Continue follow up with orthopedics  5) Superficial Venous Thrombosis of the left small saphenous vein  06/24/2019 US venous left : Right: There is no evidence of deep vein thrombosis in the lower extremity. No cystic structure found in the popliteal fossa. Left: Findings consistent with acute superficial vein thrombosis involving the left small saphenous vein. There is no evidence of deep vein thrombosis in the lower extremity. However, portions of this examination were limited- see technologist comments above. No cystic structure found in the popliteal fossa  PLAN:  -Discussed pt labwork today, 11/12/19;  all values are WNL except for RBC at 3.64, Hemoglobin at 11.3, HCT at 34.4, Platelet Count at 149, Neutro Abs 1.3, Chloride at 112, BUN at 7, Total Protein at 6.2. -She is taking acyclovir and bactrim -Labs show complete engraftment post transplant. Patient has has not recently needed transfusion support. -Will return in 3 weeks for labs then return in 3 months for follow up -Discussed that she will likely have D + 100 disease reassessment with PET/CT and BM Bx and subsequently a  discussion of maintenance treatment assuming good treatment response.  FOLLOW UP: Labs in 3 weeks RTC with Dr Irene Limbo with labs in 3 months  The total time spent in the appt was 30 minutes and more than 50% was on counseling and direct patient cares.  All of the patient's questions were answered with apparent satisfaction. The patient knows to call the clinic with any problems, questions or concerns.    Sullivan Lone MD MS AAHIVMS [facility] Hematology/Oncology Physician [facility]  (Office):       [phone] (Work cell):  [phone] (Fax):           [phone]  I, Scot Dock, am acting as a scribe for Dr. Sullivan Lone.   .I have reviewed the above documentation for accuracy and completeness, and I agree with the above. Brunetta Genera MD

## 2019-11-12 ENCOUNTER — Inpatient Hospital Stay: Payer: Medicare Other

## 2019-11-12 ENCOUNTER — Inpatient Hospital Stay (HOSPITAL_BASED_OUTPATIENT_CLINIC_OR_DEPARTMENT_OTHER): Payer: Medicare Other | Admitting: Hematology

## 2019-11-12 ENCOUNTER — Other Ambulatory Visit: Payer: Self-pay

## 2019-11-12 VITALS — BP 120/83 | HR 86 | Temp 97.9°F | Resp 17 | Ht 63.0 in | Wt 191.1 lb

## 2019-11-12 DIAGNOSIS — C9000 Multiple myeloma not having achieved remission: Secondary | ICD-10-CM

## 2019-11-12 LAB — CMP (CANCER CENTER ONLY)
ALT: 21 U/L (ref 0–44)
AST: 20 U/L (ref 15–41)
Albumin: 3.9 g/dL (ref 3.5–5.0)
Alkaline Phosphatase: 77 U/L (ref 38–126)
Anion gap: 8 (ref 5–15)
BUN: 7 mg/dL — ABNORMAL LOW (ref 8–23)
CO2: 22 mmol/L (ref 22–32)
Calcium: 9.6 mg/dL (ref 8.9–10.3)
Chloride: 112 mmol/L — ABNORMAL HIGH (ref 98–111)
Creatinine: 0.88 mg/dL (ref 0.44–1.00)
GFR, Est AFR Am: >60 mL/min (ref >60)
GFR, Estimated: >60 mL/min (ref >60)
Glucose, Bld: 88 mg/dL (ref 70–99)
Potassium: 4.6 mmol/L (ref 3.5–5.1)
Sodium: 142 mmol/L (ref 135–145)
Total Bilirubin: 0.7 mg/dL (ref 0.3–1.2)
Total Protein: 6.2 g/dL — ABNORMAL LOW (ref 6.5–8.1)

## 2019-11-12 LAB — CBC WITH DIFFERENTIAL (CANCER CENTER ONLY)
Abs Immature Granulocytes: 0.01 10*3/uL (ref 0.00–0.07)
Basophils Absolute: 0 10*3/uL (ref 0.0–0.1)
Basophils Relative: 1 %
Eosinophils Absolute: 0.1 10*3/uL (ref 0.0–0.5)
Eosinophils Relative: 2 %
HCT: 34.4 % — ABNORMAL LOW (ref 36.0–46.0)
Hemoglobin: 11.3 g/dL — ABNORMAL LOW (ref 12.0–15.0)
Immature Granulocytes: 0 %
Lymphocytes Relative: 53 %
Lymphs Abs: 2.2 10*3/uL (ref 0.7–4.0)
MCH: 31 pg (ref 26.0–34.0)
MCHC: 32.8 g/dL (ref 30.0–36.0)
MCV: 94.5 fL (ref 80.0–100.0)
Monocytes Absolute: 0.5 10*3/uL (ref 0.1–1.0)
Monocytes Relative: 12 %
Neutro Abs: 1.3 10*3/uL — ABNORMAL LOW (ref 1.7–7.7)
Neutrophils Relative %: 32 %
Platelet Count: 149 10*3/uL — ABNORMAL LOW (ref 150–400)
RBC: 3.64 MIL/uL — ABNORMAL LOW (ref 3.87–5.11)
RDW: 14.5 % (ref 11.5–15.5)
WBC Count: 4.1 10*3/uL (ref 4.0–10.5)
nRBC: 0 % (ref 0.0–0.2)

## 2019-11-12 LAB — MAGNESIUM: Magnesium: 1.7 mg/dL (ref 1.7–2.4)

## 2019-11-16 ENCOUNTER — Telehealth: Payer: Self-pay | Admitting: Hematology

## 2019-11-16 NOTE — Telephone Encounter (Signed)
Scheduled per 01/28 los, patient has been called and notified. 

## 2019-12-04 ENCOUNTER — Other Ambulatory Visit: Payer: Self-pay

## 2019-12-04 ENCOUNTER — Inpatient Hospital Stay: Payer: Medicare Other | Attending: Hematology

## 2019-12-04 DIAGNOSIS — C9000 Multiple myeloma not having achieved remission: Secondary | ICD-10-CM | POA: Insufficient documentation

## 2019-12-04 LAB — CMP (CANCER CENTER ONLY)
ALT: 17 U/L (ref 0–44)
AST: 20 U/L (ref 15–41)
Albumin: 3.9 g/dL (ref 3.5–5.0)
Alkaline Phosphatase: 83 U/L (ref 38–126)
Anion gap: 7 (ref 5–15)
BUN: 8 mg/dL (ref 8–23)
CO2: 23 mmol/L (ref 22–32)
Calcium: 9.4 mg/dL (ref 8.9–10.3)
Chloride: 111 mmol/L (ref 98–111)
Creatinine: 0.83 mg/dL (ref 0.44–1.00)
GFR, Est AFR Am: >60 mL/min (ref >60)
GFR, Estimated: >60 mL/min (ref >60)
Glucose, Bld: 94 mg/dL (ref 70–99)
Potassium: 4.6 mmol/L (ref 3.5–5.1)
Sodium: 141 mmol/L (ref 135–145)
Total Bilirubin: 0.9 mg/dL (ref 0.3–1.2)
Total Protein: 6.2 g/dL — ABNORMAL LOW (ref 6.5–8.1)

## 2019-12-04 LAB — CBC WITH DIFFERENTIAL/PLATELET
Abs Immature Granulocytes: 0 10*3/uL (ref 0.00–0.07)
Basophils Absolute: 0 10*3/uL (ref 0.0–0.1)
Basophils Relative: 1 %
Eosinophils Absolute: 0.1 10*3/uL (ref 0.0–0.5)
Eosinophils Relative: 3 %
HCT: 32.9 % — ABNORMAL LOW (ref 36.0–46.0)
Hemoglobin: 10.7 g/dL — ABNORMAL LOW (ref 12.0–15.0)
Immature Granulocytes: 0 %
Lymphocytes Relative: 51 %
Lymphs Abs: 1.6 10*3/uL (ref 0.7–4.0)
MCH: 31.6 pg (ref 26.0–34.0)
MCHC: 32.5 g/dL (ref 30.0–36.0)
MCV: 97.1 fL (ref 80.0–100.0)
Monocytes Absolute: 0.4 10*3/uL (ref 0.1–1.0)
Monocytes Relative: 13 %
Neutro Abs: 1 10*3/uL — ABNORMAL LOW (ref 1.7–7.7)
Neutrophils Relative %: 32 %
Platelets: 167 10*3/uL (ref 150–400)
RBC: 3.39 MIL/uL — ABNORMAL LOW (ref 3.87–5.11)
RDW: 13.4 % (ref 11.5–15.5)
WBC: 3 10*3/uL — ABNORMAL LOW (ref 4.0–10.5)
nRBC: 0 % (ref 0.0–0.2)

## 2019-12-04 LAB — SAMPLE TO BLOOD BANK

## 2019-12-23 DIAGNOSIS — D649 Anemia, unspecified: Secondary | ICD-10-CM | POA: Diagnosis not present

## 2019-12-23 DIAGNOSIS — C9000 Multiple myeloma not having achieved remission: Secondary | ICD-10-CM | POA: Diagnosis not present

## 2019-12-23 DIAGNOSIS — D72819 Decreased white blood cell count, unspecified: Secondary | ICD-10-CM | POA: Diagnosis not present

## 2019-12-23 DIAGNOSIS — Z9484 Stem cells transplant status: Secondary | ICD-10-CM | POA: Diagnosis not present

## 2019-12-30 DIAGNOSIS — C9001 Multiple myeloma in remission: Secondary | ICD-10-CM | POA: Diagnosis not present

## 2019-12-30 DIAGNOSIS — C9000 Multiple myeloma not having achieved remission: Secondary | ICD-10-CM | POA: Diagnosis not present

## 2019-12-30 DIAGNOSIS — Z9484 Stem cells transplant status: Secondary | ICD-10-CM | POA: Diagnosis not present

## 2019-12-30 DIAGNOSIS — Z7983 Long term (current) use of bisphosphonates: Secondary | ICD-10-CM | POA: Diagnosis not present

## 2020-01-07 DIAGNOSIS — C9001 Multiple myeloma in remission: Secondary | ICD-10-CM | POA: Diagnosis not present

## 2020-01-07 DIAGNOSIS — R05 Cough: Secondary | ICD-10-CM | POA: Diagnosis not present

## 2020-01-07 DIAGNOSIS — Z9484 Stem cells transplant status: Secondary | ICD-10-CM | POA: Diagnosis not present

## 2020-01-15 ENCOUNTER — Ambulatory Visit: Payer: Medicare Other | Attending: Internal Medicine

## 2020-01-15 DIAGNOSIS — Z23 Encounter for immunization: Secondary | ICD-10-CM

## 2020-01-15 NOTE — Progress Notes (Signed)
   Covid-19 Vaccination Clinic  Name:  Madison Parker    MRN: [id number] DOB: December 11, 1950  01/15/2020  Madison Parker was observed post Covid-19 immunization for 15 minutes without incident. She was provided with Vaccine Information Sheet and instruction to access the V-Safe system.   Madison Parker was instructed to call 911 with any severe reactions post vaccine: Marland Kitchen Difficulty breathing  . Swelling of face and throat  . A fast heartbeat  . A bad rash all over body  . Dizziness and weakness   Immunizations Administered    Name Date Dose VIS Date Route   Pfizer COVID-19 Vaccine 01/15/2020 10:09 AM 0.3 mL 09/25/2019 Intramuscular   Manufacturer: [employer], [employer]   Lot: [id number]   [place]: [id number]

## 2020-02-04 DIAGNOSIS — C9001 Multiple myeloma in remission: Secondary | ICD-10-CM | POA: Diagnosis not present

## 2020-02-04 DIAGNOSIS — Z9484 Stem cells transplant status: Secondary | ICD-10-CM | POA: Diagnosis not present

## 2020-02-04 DIAGNOSIS — R05 Cough: Secondary | ICD-10-CM | POA: Diagnosis not present

## 2020-02-08 ENCOUNTER — Ambulatory Visit: Payer: Medicare Other | Attending: Internal Medicine

## 2020-02-08 DIAGNOSIS — Z23 Encounter for immunization: Secondary | ICD-10-CM

## 2020-02-08 NOTE — Progress Notes (Signed)
   Covid-19 Vaccination Clinic  Name:  Madison Parker    MRN: [id number] DOB: 08/06/1951  02/08/2020  [name] was observed post Covid-19 immunization for 15 minutes without incident. She was provided with Vaccine Information Sheet and instruction to access the V-Safe system.   [name] was instructed to call 911 with any severe reactions post vaccine: Marland Kitchen Difficulty breathing  . Swelling of face and throat  . A fast heartbeat  . A bad rash all over body  . Dizziness and weakness   Immunizations Administered    Name Date Dose VIS Date Route   Pfizer COVID-19 Vaccine 02/08/2020 11:56 AM 0.3 mL 12/09/2018 Intramuscular   Manufacturer: [place]   Lot: [id number]   [place]: [id number]

## 2020-02-10 ENCOUNTER — Other Ambulatory Visit: Payer: Self-pay

## 2020-02-10 ENCOUNTER — Inpatient Hospital Stay: Payer: Medicare Other

## 2020-02-10 ENCOUNTER — Inpatient Hospital Stay: Payer: Medicare Other | Attending: Hematology | Admitting: Hematology

## 2020-02-10 VITALS — BP 123/74 | HR 75 | Temp 98.5°F | Resp 18 | Ht 63.0 in | Wt 188.2 lb

## 2020-02-10 DIAGNOSIS — C9000 Multiple myeloma not having achieved remission: Secondary | ICD-10-CM

## 2020-02-10 DIAGNOSIS — D649 Anemia, unspecified: Secondary | ICD-10-CM | POA: Diagnosis not present

## 2020-02-10 DIAGNOSIS — Z9481 Bone marrow transplant status: Secondary | ICD-10-CM

## 2020-02-10 LAB — CBC WITH DIFFERENTIAL/PLATELET
Abs Immature Granulocytes: 0 10*3/uL (ref 0.00–0.07)
Basophils Absolute: 0 10*3/uL (ref 0.0–0.1)
Basophils Relative: 1 %
Eosinophils Absolute: 0.1 10*3/uL (ref 0.0–0.5)
Eosinophils Relative: 3 %
HCT: 34 % — ABNORMAL LOW (ref 36.0–46.0)
Hemoglobin: 10.7 g/dL — ABNORMAL LOW (ref 12.0–15.0)
Immature Granulocytes: 0 %
Lymphocytes Relative: 43 %
Lymphs Abs: 1.2 10*3/uL (ref 0.7–4.0)
MCH: 30.6 pg (ref 26.0–34.0)
MCHC: 31.5 g/dL (ref 30.0–36.0)
MCV: 97.1 fL (ref 80.0–100.0)
Monocytes Absolute: 0.3 10*3/uL (ref 0.1–1.0)
Monocytes Relative: 11 %
Neutro Abs: 1.2 10*3/uL — ABNORMAL LOW (ref 1.7–7.7)
Neutrophils Relative %: 42 %
Platelets: 157 10*3/uL (ref 150–400)
RBC: 3.5 MIL/uL — ABNORMAL LOW (ref 3.87–5.11)
RDW: 12.5 % (ref 11.5–15.5)
WBC: 2.8 10*3/uL — ABNORMAL LOW (ref 4.0–10.5)
nRBC: 0 % (ref 0.0–0.2)

## 2020-02-10 LAB — TYPE AND SCREEN
ABO/RH(D): B POS
Antibody Screen: NEGATIVE

## 2020-02-10 LAB — CMP (CANCER CENTER ONLY)
ALT: 22 U/L (ref 0–44)
AST: 25 U/L (ref 15–41)
Albumin: 3.8 g/dL (ref 3.5–5.0)
Alkaline Phosphatase: 112 U/L (ref 38–126)
Anion gap: 8 (ref 5–15)
BUN: 11 mg/dL (ref 8–23)
CO2: 24 mmol/L (ref 22–32)
Calcium: 9.3 mg/dL (ref 8.9–10.3)
Chloride: 109 mmol/L (ref 98–111)
Creatinine: 0.83 mg/dL (ref 0.44–1.00)
GFR, Est AFR Am: >60 mL/min (ref >60)
GFR, Estimated: >60 mL/min (ref >60)
Glucose, Bld: 87 mg/dL (ref 70–99)
Potassium: 4.5 mmol/L (ref 3.5–5.1)
Sodium: 141 mmol/L (ref 135–145)
Total Bilirubin: 0.9 mg/dL (ref 0.3–1.2)
Total Protein: 6.6 g/dL (ref 6.5–8.1)

## 2020-02-10 LAB — ABO/RH: ABO/RH(D): B POS

## 2020-02-10 NOTE — Progress Notes (Signed)
HEMATOLOGY/ONCOLOGY CLINIC NOTE  Date of Service: 02/10/20     PCP: Madison Board MD  CHIEF COMPLAINTS/PURPOSE OF CONSULTATION:  Continued f/u for mx of myeloma   HISTORY OF PRESENTING ILLNESS:   Madison Parker is a wonderful 69 y.o. female who has been referred to [place] by Madison Parker  for evaluation and management of smoldering multiple myeloma.  Patient has a history of smoldering multiple myeloma which she reports she was diagnosed with in 2011 when she was evaluated by a hematologist in [place]. She reports that she presented with low blood counts (low WBC)   and after significant workup she had a bone marrow examination based on which she was told that she has smoldering multiple myeloma. Patient notes that she had a bone survey at that time which was negative. She was monitored there closely and then moved to [facility] in 2015 to stay with her son whose wife was being treated for breast cancer. Patient was following with Madison. Delight Parker in [place]. She reports not having had any treatment for multiple myeloma.  Her last labs from about a year ago showed a SPEP with no OBSERVED M spike . Serum free light chains showed an elevation of kappa Free light chain 310 and lambda free light chain of 12.7 with an abnormal Kappa/ Lambda ratio of 24.38 ( up from 19 previously). Random urine showed an M protein complement of 44%.  She lives in [place] and requested transfer of care to [place]. She was offered follow-up but [place] in [place]  But prefers to  follow [place] in [place]. Patient reports her energy levels been stable. She has chronic back pain which has improved since the patient had her spinal surgery in April 2017 (L4-L5 interbody fusion). Patient notes she has had some chronic left hip pain which is somewhat more bothersome with the navy on the lateral aspect of her upper thighs that's painful. She also notes some right hip pain.  Over the last few weeks he notes some upper neck pain especially when bending her neck backwards and tingling numbness in her right upper extremity.  Has been working on losing some weight voluntarily.  No acute other new symptoms.  AUTOLOGOUS STEM CELL TRANSPLANT:  Disease status at time of transplant: sCR MRD positive  ASBMT risk stratification: low risk, ECOG/KPS: 0 / 90%, HSCT-CI score: 0  Started mobilization on 09/03/19 using GCS-F and Plerixafor. She collected stem cells on 09/07/19. The pre-processing total was 7.18x10(6) CD34+ cells/kg. The post-processing total was 1.11x10(7) CD34+ cells/kg frozen in 3 bags.  Preparative regimen with Melphalan 200 mg/m2 on 09/14/19 (outpatient).  Infusion of stem cells - 7.4 x 10(6) on 09/15/19 (outpatient)  Admitted Day +8 for neutropenic fever, uncontrolled n/v, diarrhea  Treated with short course of prednisone for peri-engraftment syndrome  TREATMENT:  Started induction therapy with carfilozomib, lenlaidomide, and dexamethasone 28 day cycle on 04/27/2019. She had a great response to initial cycle. She developed a DVT on 06/24/19 and was started on apixaban as an anticoagulant. She had normalization of her light chains by the start of C4 on 07/28/19.  She was referred to the myeloma clinic at [facility] for BMT consult and seen on 08/10/19. Serology from her visit showed no M-spike by SPEP, a free light chain ratio of 1.07 (kappa 11.42, lambda 10.65), and an abnormal beta-2-micro of 3.02. She was started on pre-transplant evaluation while undergoing C5 of therapy.  Treatment discontinued in 07/2019 in  preparation for autologous stem cell transplant.   INTERVAL HISTORY:   Madison Parker returns today for f/u of her multiple myeloma . She is s/p HDT -Auto HSCT on 09/15/2019. Reviewed post transplant course with her based on [facility] records. The patient's last visit with [place] was on 11/12/2019. The pt reports that she is doing well overall.  The pt  reports that she is now 100+ days from transplant and is feeling well. Pt received both doses of the COVID19 vaccine and tolerated them well. She is currently only taking Folic Acid, Vitamin O88, 50K Units vitamin D, Acyclovir, and a daily multivitamin. She does have some joint pain with Acyclovir. Pt is scheduled to follow up at [facility] in June. She unsure if her transplant team is planning on completing her post-transplant vaccinations.   Pt is currently scheduled for a cleaning and Fluoride treatment with her dentist, Madison. Burt Parker, in May. She has no dental issues at this time.   Her back pain is stable at this time and is well-controlled with a Lidocane patch.   Of note since the patient's last visit, pt has had PET/CT completed on 12/23/2019 with results revealing "1. No FDG avid lesion to suggest active multiple myeloma. 2. Additional CT findings as above."  Pt has had BM Bx ([id number]) completed on 12/23/2019 which revealed  "BONE MARROW:Normocellular trilineage hematopoiesis with maturation.No plasma cell myeloma identified. PERIPHERAL BLOOD:Leukopenia and normocytic anemia.No rouleaux or circulating plasma cells identified."  Lab results today (02/10/20) of CBC w/diff and CMP is as follows: all values are WNL except for WBC at 2.8K, RBC at 3.50, Hgb at 10.7, HCT at 34.0, Neutro Abs at 1.2K.  On review of systems, pt reports joint pain, chronic back pain and denies any other symptoms.   MEDICAL HISTORY:  Past Medical History:  Diagnosis Date  . Headache   . Low back pain   . SBO (small bowel obstruction) ([place]) 2010  . Smoldering multiple myeloma ([facility])   Previous history of hypothyroidism- not currently medications Anxiety Obesity .Body mass index is 33.34 kg/m. Vitamin D deficiency Smoldering multiple myeloma diagnosed in 2011 Lumbosacral radiculopathy Left hip pain  SURGICAL HISTORY: Past Surgical History:  Procedure Laterality Date  . ABDOMINAL HYSTERECTOMY      complete  . BACK SURGERY     lower at [facility]  . colonscopy  2014  . GASTRIC BYPASS  yrs ago  . HERNIA REPAIR    . IR IMAGING GUIDED PORT INSERTION  04/20/2019  . KNEE SURGERY  06/04/2009   both knees replaced   . TONSILLECTOMY  age 42  . TOTAL HIP ARTHROPLASTY Left 08/16/2017   Procedure: LEFT TOTAL HIP ARTHROPLASTY ANTERIOR APPROACH;  Surgeon: Dorna Leitz, MD;  Location: [facility];  Service: Orthopedics;  Laterality: Left;  Spinal surgery April 2017  SOCIAL HISTORY: Social History   Socioeconomic History  . Marital status: Married    Spouse name: Not on file  . Number of children: Not on file  . Years of education: Not on file  . Highest education level: Not on file  Occupational History  . Not on file  Tobacco Use  . Smoking status: Former Smoker    Packs/day: 0.50    Years: 10.00    Pack years: 5.00    Types: Cigarettes  . Smokeless tobacco: Never Used  . Tobacco comment: quit 1977  Substance and Sexual Activity  . Alcohol use: Yes    Comment: occ  . Drug use: No  . Sexual  activity: Not on file  Other Topics Concern  . Not on file  Social History Narrative  . Not on file   Social Determinants of Health   Financial Resource Strain:   . Difficulty of Paying Living Expenses:   Food Insecurity:   . Worried About Charity fundraiser in the Last Year:   . Arboriculturist in the Last Year:   Transportation Needs:   . Film/video editor (Medical):   Marland Kitchen Lack of Transportation (Non-Medical):   Physical Activity:   . Days of Exercise per Week:   . Minutes of Exercise per Session:   Stress:   . Feeling of Stress :   Social Connections:   . Frequency of Communication with Friends and Family:   . Frequency of Social Gatherings with Friends and Family:   . Attends Religious Services:   . Active Member of Clubs or Organizations:   . Attends Archivist Meetings:   Marland Kitchen Marital Status:   Intimate Partner Violence:   . Fear of Current or Ex-Partner:   .  Emotionally Abused:   Marland Kitchen Physically Abused:   . Sexually Abused:   Occasional alcohol use Former smoker and smoked 1 pack per week for about 9 years has since quit.  FAMILY HISTORY: No family history on file.  ALLERGIES:  is allergic to codeine.  MEDICATIONS:  Current Outpatient Medications  Medication Sig Dispense Refill  . FOLIC ACID PO Take 1 tablet by mouth.    . lidocaine-prilocaine (EMLA) cream Apply 1 application topically as needed. 30 g 2  . MULTIPLE VITAMIN PO Take 1 tablet by mouth daily.    . vitamin B-12 (CYANOCOBALAMIN) 1000 MCG tablet Take 5,000 mcg by mouth daily.     Marland Kitchen acetaminophen (TYLENOL) 650 MG CR tablet Take 650 mg by mouth every 8 (eight) hours as needed for pain.    Marland Kitchen acyclovir (ZOVIRAX) 400 MG tablet Take 1 tablet (400 mg total) by mouth 2 (two) times daily. 180 tablet 0  . apixaban (ELIQUIS) 5 MG TABS tablet Take 1 tablet (5 mg total) by mouth 2 (two) times daily. 60 tablet 2  . lenalidomide (REVLIMID) 15 MG capsule Take 1 capsule (15 mg total) by mouth daily. Take for 21 d on, 7 d off, repeat every 28d 21 capsule 0  . ondansetron (ZOFRAN) 8 MG tablet Take 1 tablet (8 mg total) by mouth 2 (two) times daily as needed (Nausea or vomiting). (Patient not taking: Reported on 02/10/2020) 30 tablet 1  . prochlorperazine (COMPAZINE) 10 MG tablet Take 1 tablet (10 mg total) by mouth every 6 (six) hours as needed for nausea or vomiting. 30 tablet 0  . prochlorperazine (COMPAZINE) 10 MG tablet Take 1 tablet (10 mg total) by mouth every 6 (six) hours as needed (Nausea or vomiting). 30 tablet 1   No current facility-administered medications for this visit.   Facility-Administered Medications Ordered in Other Visits  Medication Dose Route Frequency Provider Last Rate Last Admin  . sodium chloride flush (NS) 0.9 % injection 10 mL  10 mL Intracatheter PRN Brunetta Genera, MD   10 mL at 05/11/19 1437    REVIEW OF SYSTEMS:   A 10+ POINT REVIEW OF SYSTEMS WAS OBTAINED  including neurology, dermatology, psychiatry, cardiac, respiratory, lymph, extremities, GI, GU, Musculoskeletal, constitutional, breasts, reproductive, HEENT.  All pertinent positives are noted in the HPI.  All others are negative.   PHYSICAL EXAMINATION: ECOG FS:1 - Symptomatic but completely ambulatory  Vitals:  02/10/20 1115  BP: 123/74  Pulse: 75  Resp: 18  Temp: 98.5 F (36.9 C)  SpO2: 98%   Wt Readings from Last 3 Encounters:  02/10/20 188 lb 3.2 oz (85.4 kg)  11/12/19 191 lb 1.6 oz (86.7 kg)  08/04/19 213 lb 12 oz (97 kg)   Body mass index is 33.34 kg/m.    GENERAL:alert, in no acute distress and comfortable SKIN: no acute rashes, no significant lesions EYES: conjunctiva are pink and non-injected, sclera anicteric OROPHARYNX: MMM, no exudates, no oropharyngeal erythema or ulceration NECK: supple, no JVD LYMPH:  no palpable lymphadenopathy in the cervical, axillary or inguinal regions LUNGS: clear to auscultation b/l with normal respiratory effort HEART: regular rate & rhythm ABDOMEN:  normoactive bowel sounds , non tender, not distended. No palpable hepatosplenomegaly.  Extremity: no pedal edema PSYCH: alert & oriented x 3 with fluent speech NEURO: no focal motor/sensory deficits  LABORATORY DATA:  I have reviewed the data as listed  . CBC Latest Ref Rng & Units 02/10/2020 12/04/2019 11/12/2019  WBC 4.0 - 10.5 K/uL 2.8(L) 3.0(L) 4.1  Hemoglobin 12.0 - 15.0 g/dL 10.7(L) 10.7(L) 11.3(L)  Hematocrit 36.0 - 46.0 % 34.0(L) 32.9(L) 34.4(L)  Platelets 150 - 400 K/uL 157 167 149(L)  ANC 1300 . CBC    Component Value Date/Time   WBC 2.8 (L) 02/10/2020 1005   RBC 3.50 (L) 02/10/2020 1005   HGB 10.7 (L) 02/10/2020 1005   HGB 11.3 (L) 11/12/2019 0836   HGB 11.3 (L) 09/30/2017 1145   HCT 34.0 (L) 02/10/2020 1005   HCT 35.1 09/30/2017 1145   PLT 157 02/10/2020 1005   PLT 149 (L) 11/12/2019 0836   PLT 172 09/30/2017 1145   MCV 97.1 02/10/2020 1005   MCV 97.5  09/30/2017 1145   MCH 30.6 02/10/2020 1005   MCHC 31.5 02/10/2020 1005   RDW 12.5 02/10/2020 1005   RDW 14.0 09/30/2017 1145   LYMPHSABS 1.2 02/10/2020 1005   LYMPHSABS 1.3 09/30/2017 1145   MONOABS 0.3 02/10/2020 1005   MONOABS 0.2 09/30/2017 1145   EOSABS 0.1 02/10/2020 1005   EOSABS 0.0 09/30/2017 1145   EOSABS 0.1 06/03/2014 1003   BASOSABS 0.0 02/10/2020 1005   BASOSABS 0.0 09/30/2017 1145     . CMP Latest Ref Rng & Units 02/10/2020 12/04/2019 11/12/2019  Glucose 70 - 99 mg/dL 87 94 88  BUN 8 - 23 mg/dL 11 8 7(L)  Creatinine 0.44 - 1.00 mg/dL 0.83 0.83 0.88  Sodium 135 - 145 mmol/L 141 141 142  Potassium 3.5 - 5.1 mmol/L 4.5 4.6 4.6  Chloride 98 - 111 mmol/L 109 111 112(H)  CO2 22 - 32 mmol/L 24 23 22   Calcium 8.9 - 10.3 mg/dL 9.3 9.4 9.6  Total Protein 6.5 - 8.1 g/dL 6.6 6.2(L) 6.2(L)  Total Bilirubin 0.3 - 1.2 mg/dL 0.9 0.9 0.7  Alkaline Phos 38 - 126 U/L 112 83 77  AST 15 - 41 U/L 25 20 20   ALT 0 - 44 U/L 22 17 21          Component     Latest Ref Rng & Units 11/08/2016  LDH     125 - 245 U/L 220  Beta 2     0.6 - 2.4 mg/L 2.5 (H)   Component     Latest Ref Rng & Units 07/30/2017  Ferritin     9 - 269 ng/ml 81  Vitamin B12     232 - 1,245 pg/mL 594   Magnesium: Component  Magnesium  Latest Ref Rng & Units 1.7 - 2.4 mg/dL  10/02/2019 1.7  10/21/2019 1.7  11/12/2019 1.7    03/11/19 BM Bx:   03/11/19 Cytogenetics:            RADIOGRAPHIC STUDIES: I have personally reviewed the radiological images as listed and agreed with the findings in the report. No results found.   Bone survey 11/08/2016 IMPRESSION: 1. Small lytic lesion in the right scapula. 2. Possible small lytic lesion in the left scapula. 3. Postoperative changes. 4. Significant degenerative changes in both hips, left greater than right. 5. No evidence for acute fracture   Electronically Signed   By: Nolon Nations M.D.   On: 11/08/2016 16:19   ASSESSMENT & PLAN:    69 y.o. very pleasant lady with history of   1) Multiple myeloma (concern for small lytic lesions in left and right scapulae) - (appears light chain producing) and Progressive anemia  This was apparently diagnosed as smoldering myeloma in 2011 by her hematologist in [place].  No renal failure/hypercalcemia.  Bone survey with concern for possible small lytic lesions in B/L scapulae. PET/CT scan on 03/12/2017 showed no concerning bone lesions.  Initial Bm Bx with 17% kappa restricted plasma cells consistent with plasma cell neoplasm.  No treatment so far.  03/11/19 BM Bx revealed involvement by 50% plasma cells; genetics -high risk t(14;16), previous genetics from April 2018 BM Bx were standard risk  03/16/19 MRI Lumbar reveals several explanations for her lower back pain including L2-L3 stenosis, but reassuringly, there was not evidence of involvement by multiple myeloma  04/01/19 PET/CT which revealed no FDG evidence of active multiple myeloma within the skeleton. No evidence of lytic lesions within the skeleton or soft tissue Plasmacytoma.  05/07/2019 DXA scan revealed osteopenia, T-score of -1.2.  2) Chronic leukopenia - ? Related to SMM vs other nutritional deficiencies (h/o gastric bypass surgery put her at risk for nutritional deficiencies)  B12 levels WNL  Ferritin adequate  ?additional factor -recent NSAID use.  ?element of benign ethnic neutropenia.  Has not had any issues with frequent infection.   ANC post-operative normalized today at 1700  4) Neuropathy/radiculopathy -Continue follow up with orthopedics  5) Superficial Venous Thrombosis of the left small saphenous vein  06/24/2019 US venous left : Right: There is no evidence of deep vein thrombosis in the lower extremity. No cystic structure found in the popliteal fossa. Left: Findings consistent with acute superficial vein thrombosis involving the left small saphenous vein. There is no evidence of deep  vein thrombosis in the lower extremity. However, portions of this examination were limited- see technologist comments above. No cystic structure found in the popliteal fossa  PLAN: -Discussed pt labwork today, 02/10/20; WBC are slowly increasing, mild anemia, PLT are normal, blood chemistries are normal -Kappa light chain myeloma: Adequate hematopoiesis. Transfusion independent. Now day + 106 status post autologous stem cell transplant. Remains in sCR. MRD status is negative by flow. We recommend maintenance carfilzomib 20 mg/m2 (first dose) and then 56 mg/m2 every other week  given her high risk cytogenetics.  - Will begin in 2 weeks to allow pt to full respond to recently acquired COVID19 vaccine    - Bisphosphonate therapy:will rx with Zometa q56mto complete a total of two years of therapy as long as renal function allows. Space 2-3 months before and after any dental interventions. - Pt does not currently need any dental work and has an upcoming appointment with Dentist in May  -  Recommend pt continue weekly 50K UT Vitamin D and intake enough dietary calcium - Continue antiviral therapy with Acyclovir for the suppression of HSV and VZV. No immunizations are recommended at this time.  - She has resumed her MVI, vitamin B12, and vitamin D. Recommend she resume her folic acid as well.  -Will see back in 4 weeks with second maintenance treatment   FOLLOW UP: -Schedule to start maintenance Carfilzomib q2weeks with labs (starting in 2 weeks) -starting maintenance Zometa q12 weeks starting in 2 weeks -MD visit in 4 weeks with 2nd dose of maintenance Carfilzomib     The total time spent in the appt was 40 minutes and more than 50% was on counseling and direct patient cares, reviewing post transplant course and setting up maintenance rx  All of the patient's questions were answered with apparent satisfaction. The patient knows to call the clinic with any problems, questions or concerns.    Sullivan Lone MD [place] AAHIVMS [facility] Hematology/Oncology Physician [facility]  (Office):       [phone] (Work cell):  [phone] (Fax):           [phone]  I, Yevette Edwards, am acting as a scribe for Madison. Sullivan Lone.   .I have reviewed the above documentation for accuracy and completeness, and I agree with the above. Brunetta Genera MD

## 2020-02-19 ENCOUNTER — Other Ambulatory Visit: Payer: Self-pay | Admitting: Hematology

## 2020-02-19 ENCOUNTER — Telehealth: Payer: Self-pay | Admitting: Hematology

## 2020-02-19 NOTE — Telephone Encounter (Signed)
Scheduled per 04/28 los, patient has been called and notified. ?

## 2020-02-24 ENCOUNTER — Other Ambulatory Visit: Payer: Self-pay

## 2020-02-24 ENCOUNTER — Inpatient Hospital Stay: Payer: Medicare Other

## 2020-02-24 ENCOUNTER — Inpatient Hospital Stay: Payer: Medicare Other | Attending: Hematology

## 2020-02-24 ENCOUNTER — Other Ambulatory Visit: Payer: Medicare Other

## 2020-02-24 VITALS — BP 102/66 | HR 72 | Temp 98.5°F | Resp 17 | Wt 186.5 lb

## 2020-02-24 DIAGNOSIS — C9000 Multiple myeloma not having achieved remission: Secondary | ICD-10-CM

## 2020-02-24 DIAGNOSIS — Z86718 Personal history of other venous thrombosis and embolism: Secondary | ICD-10-CM | POA: Insufficient documentation

## 2020-02-24 DIAGNOSIS — D649 Anemia, unspecified: Secondary | ICD-10-CM | POA: Insufficient documentation

## 2020-02-24 DIAGNOSIS — Z5112 Encounter for antineoplastic immunotherapy: Secondary | ICD-10-CM | POA: Diagnosis not present

## 2020-02-24 DIAGNOSIS — Z7189 Other specified counseling: Secondary | ICD-10-CM

## 2020-02-24 DIAGNOSIS — Z95828 Presence of other vascular implants and grafts: Secondary | ICD-10-CM

## 2020-02-24 LAB — CBC WITH DIFFERENTIAL/PLATELET
Abs Immature Granulocytes: 0.01 10*3/uL (ref 0.00–0.07)
Basophils Absolute: 0 10*3/uL (ref 0.0–0.1)
Basophils Relative: 1 %
Eosinophils Absolute: 0.1 10*3/uL (ref 0.0–0.5)
Eosinophils Relative: 2 %
HCT: 33.4 % — ABNORMAL LOW (ref 36.0–46.0)
Hemoglobin: 10.8 g/dL — ABNORMAL LOW (ref 12.0–15.0)
Immature Granulocytes: 0 %
Lymphocytes Relative: 36 %
Lymphs Abs: 1.5 10*3/uL (ref 0.7–4.0)
MCH: 31.1 pg (ref 26.0–34.0)
MCHC: 32.3 g/dL (ref 30.0–36.0)
MCV: 96.3 fL (ref 80.0–100.0)
Monocytes Absolute: 0.4 10*3/uL (ref 0.1–1.0)
Monocytes Relative: 9 %
Neutro Abs: 2.2 10*3/uL (ref 1.7–7.7)
Neutrophils Relative %: 52 %
Platelets: 177 10*3/uL (ref 150–400)
RBC: 3.47 MIL/uL — ABNORMAL LOW (ref 3.87–5.11)
RDW: 12.7 % (ref 11.5–15.5)
WBC: 4.1 10*3/uL (ref 4.0–10.5)
nRBC: 0 % (ref 0.0–0.2)

## 2020-02-24 LAB — CMP (CANCER CENTER ONLY)
ALT: 22 U/L (ref 0–44)
AST: 23 U/L (ref 15–41)
Albumin: 3.7 g/dL (ref 3.5–5.0)
Alkaline Phosphatase: 121 U/L (ref 38–126)
Anion gap: 8 (ref 5–15)
BUN: 11 mg/dL (ref 8–23)
CO2: 22 mmol/L (ref 22–32)
Calcium: 9.1 mg/dL (ref 8.9–10.3)
Chloride: 112 mmol/L — ABNORMAL HIGH (ref 98–111)
Creatinine: 0.82 mg/dL (ref 0.44–1.00)
GFR, Est AFR Am: >60 mL/min (ref >60)
GFR, Estimated: >60 mL/min (ref >60)
Glucose, Bld: 103 mg/dL — ABNORMAL HIGH (ref 70–99)
Potassium: 3.7 mmol/L (ref 3.5–5.1)
Sodium: 142 mmol/L (ref 135–145)
Total Bilirubin: 0.9 mg/dL (ref 0.3–1.2)
Total Protein: 6.4 g/dL — ABNORMAL LOW (ref 6.5–8.1)

## 2020-02-24 MED ORDER — SODIUM CHLORIDE 0.9% FLUSH
10.0000 mL | Freq: Once | INTRAVENOUS | Status: AC
  Administered 2020-02-24: 11:00:00 10 mL
  Filled 2020-02-24: qty 10

## 2020-02-24 MED ORDER — SODIUM CHLORIDE 0.9 % IV SOLN
20.0000 mg | Freq: Once | INTRAVENOUS | Status: AC
  Administered 2020-02-24: 12:00:00 20 mg via INTRAVENOUS
  Filled 2020-02-24: qty 20

## 2020-02-24 MED ORDER — DEXTROSE 5 % IV SOLN
19.5000 mg/m2 | Freq: Once | INTRAVENOUS | Status: AC
  Administered 2020-02-24: 13:00:00 40 mg via INTRAVENOUS
  Filled 2020-02-24: qty 5

## 2020-02-24 MED ORDER — PROCHLORPERAZINE MALEATE 10 MG PO TABS
ORAL_TABLET | ORAL | Status: AC
  Filled 2020-02-24: qty 1

## 2020-02-24 MED ORDER — PROCHLORPERAZINE MALEATE 10 MG PO TABS
10.0000 mg | ORAL_TABLET | Freq: Once | ORAL | Status: AC
  Administered 2020-02-24: 12:00:00 10 mg via ORAL

## 2020-02-24 MED ORDER — ACETAMINOPHEN 500 MG PO TABS
ORAL_TABLET | ORAL | Status: AC
  Filled 2020-02-24: qty 2

## 2020-02-24 MED ORDER — ACETAMINOPHEN 500 MG PO TABS
1000.0000 mg | ORAL_TABLET | Freq: Once | ORAL | Status: AC
  Administered 2020-02-24: 12:00:00 1000 mg via ORAL

## 2020-02-24 MED ORDER — SODIUM CHLORIDE 0.9 % IV SOLN
Freq: Once | INTRAVENOUS | Status: AC
  Filled 2020-02-24: qty 250

## 2020-02-24 MED ORDER — SODIUM CHLORIDE 0.9% FLUSH
10.0000 mL | INTRAVENOUS | Status: DC | PRN
  Administered 2020-02-24: 14:00:00 10 mL
  Filled 2020-02-24: qty 10

## 2020-02-24 MED ORDER — HEPARIN SOD (PORK) LOCK FLUSH 100 UNIT/ML IV SOLN
500.0000 [IU] | Freq: Once | INTRAVENOUS | Status: AC | PRN
  Administered 2020-02-24: 500 [IU]
  Filled 2020-02-24: qty 5

## 2020-02-24 NOTE — Patient Instructions (Signed)
[facility] Discharge Instructions for Patients Receiving Chemotherapy  Today you received the following chemotherapy agents:  Kyprolis  To help prevent nausea and vomiting after your treatment, we encourage you to take your nausea medication as directed.   If you develop nausea and vomiting that is not controlled by your nausea medication, call the clinic.   BELOW ARE SYMPTOMS THAT SHOULD BE REPORTED IMMEDIATELY:  *FEVER GREATER THAN 100.5 F  *CHILLS WITH OR WITHOUT FEVER  NAUSEA AND VOMITING THAT IS NOT CONTROLLED WITH YOUR NAUSEA MEDICATION  *UNUSUAL SHORTNESS OF BREATH  *UNUSUAL BRUISING OR BLEEDING  TENDERNESS IN MOUTH AND THROAT WITH OR WITHOUT PRESENCE OF ULCERS  *URINARY PROBLEMS  *BOWEL PROBLEMS  UNUSUAL RASH Items with * indicate a potential emergency and should be followed up as soon as possible.  Feel free to call the clinic should you have any questions or concerns. The clinic phone number is [phone].  Please show the CHEMO ALERT CARD at check-in to the Emergency Department and triage nurse.   

## 2020-02-24 NOTE — Patient Instructions (Signed)

## 2020-03-03 DIAGNOSIS — Z79899 Other long term (current) drug therapy: Secondary | ICD-10-CM | POA: Diagnosis not present

## 2020-03-03 DIAGNOSIS — Z9484 Stem cells transplant status: Secondary | ICD-10-CM | POA: Diagnosis not present

## 2020-03-09 ENCOUNTER — Inpatient Hospital Stay: Payer: Medicare Other

## 2020-03-09 ENCOUNTER — Inpatient Hospital Stay (HOSPITAL_BASED_OUTPATIENT_CLINIC_OR_DEPARTMENT_OTHER): Payer: Medicare Other | Admitting: Hematology

## 2020-03-09 ENCOUNTER — Other Ambulatory Visit: Payer: Self-pay

## 2020-03-09 ENCOUNTER — Ambulatory Visit: Payer: Medicare Other

## 2020-03-09 VITALS — BP 143/83 | HR 69 | Temp 97.8°F | Resp 18 | Ht 63.0 in | Wt 190.3 lb

## 2020-03-09 DIAGNOSIS — C9000 Multiple myeloma not having achieved remission: Secondary | ICD-10-CM

## 2020-03-09 DIAGNOSIS — D649 Anemia, unspecified: Secondary | ICD-10-CM | POA: Diagnosis not present

## 2020-03-09 DIAGNOSIS — Z95828 Presence of other vascular implants and grafts: Secondary | ICD-10-CM

## 2020-03-09 DIAGNOSIS — Z7189 Other specified counseling: Secondary | ICD-10-CM

## 2020-03-09 DIAGNOSIS — Z5112 Encounter for antineoplastic immunotherapy: Secondary | ICD-10-CM | POA: Diagnosis not present

## 2020-03-09 DIAGNOSIS — Z86718 Personal history of other venous thrombosis and embolism: Secondary | ICD-10-CM | POA: Diagnosis not present

## 2020-03-09 DIAGNOSIS — Z5111 Encounter for antineoplastic chemotherapy: Secondary | ICD-10-CM | POA: Diagnosis not present

## 2020-03-09 LAB — CBC WITH DIFFERENTIAL/PLATELET
Abs Immature Granulocytes: 0.01 10*3/uL (ref 0.00–0.07)
Basophils Absolute: 0 10*3/uL (ref 0.0–0.1)
Basophils Relative: 1 %
Eosinophils Absolute: 0.1 10*3/uL (ref 0.0–0.5)
Eosinophils Relative: 1 %
HCT: 30.9 % — ABNORMAL LOW (ref 36.0–46.0)
Hemoglobin: 10.1 g/dL — ABNORMAL LOW (ref 12.0–15.0)
Immature Granulocytes: 0 %
Lymphocytes Relative: 32 %
Lymphs Abs: 1.3 10*3/uL (ref 0.7–4.0)
MCH: 30.7 pg (ref 26.0–34.0)
MCHC: 32.7 g/dL (ref 30.0–36.0)
MCV: 93.9 fL (ref 80.0–100.0)
Monocytes Absolute: 0.4 10*3/uL (ref 0.1–1.0)
Monocytes Relative: 10 %
Neutro Abs: 2.3 10*3/uL (ref 1.7–7.7)
Neutrophils Relative %: 56 %
Platelets: 171 10*3/uL (ref 150–400)
RBC: 3.29 MIL/uL — ABNORMAL LOW (ref 3.87–5.11)
RDW: 12.9 % (ref 11.5–15.5)
WBC: 4.1 10*3/uL (ref 4.0–10.5)
nRBC: 0 % (ref 0.0–0.2)

## 2020-03-09 LAB — CMP (CANCER CENTER ONLY)
ALT: 26 U/L (ref 0–44)
AST: 26 U/L (ref 15–41)
Albumin: 3.5 g/dL (ref 3.5–5.0)
Alkaline Phosphatase: 107 U/L (ref 38–126)
Anion gap: 7 (ref 5–15)
BUN: 8 mg/dL (ref 8–23)
CO2: 23 mmol/L (ref 22–32)
Calcium: 8.8 mg/dL — ABNORMAL LOW (ref 8.9–10.3)
Chloride: 111 mmol/L (ref 98–111)
Creatinine: 0.77 mg/dL (ref 0.44–1.00)
GFR, Est AFR Am: >60 mL/min (ref >60)
GFR, Estimated: >60 mL/min (ref >60)
Glucose, Bld: 83 mg/dL (ref 70–99)
Potassium: 3.7 mmol/L (ref 3.5–5.1)
Sodium: 141 mmol/L (ref 135–145)
Total Bilirubin: 0.9 mg/dL (ref 0.3–1.2)
Total Protein: 6 g/dL — ABNORMAL LOW (ref 6.5–8.1)

## 2020-03-09 MED ORDER — HEPARIN SOD (PORK) LOCK FLUSH 100 UNIT/ML IV SOLN
500.0000 [IU] | Freq: Once | INTRAVENOUS | Status: AC | PRN
  Administered 2020-03-09: 500 [IU]
  Filled 2020-03-09: qty 5

## 2020-03-09 MED ORDER — DEXTROSE 5 % IV SOLN
50.0000 mg | Freq: Once | INTRAVENOUS | Status: AC
  Administered 2020-03-09: 50 mg via INTRAVENOUS
  Filled 2020-03-09: qty 10

## 2020-03-09 MED ORDER — SODIUM CHLORIDE 0.9 % IV SOLN
Freq: Once | INTRAVENOUS | Status: AC
  Filled 2020-03-09: qty 250

## 2020-03-09 MED ORDER — ACETAMINOPHEN 500 MG PO TABS
ORAL_TABLET | ORAL | Status: AC
  Filled 2020-03-09: qty 2

## 2020-03-09 MED ORDER — SODIUM CHLORIDE 0.9% FLUSH
10.0000 mL | INTRAVENOUS | Status: DC | PRN
  Administered 2020-03-09: 10 mL
  Filled 2020-03-09: qty 10

## 2020-03-09 MED ORDER — ACETAMINOPHEN 500 MG PO TABS
1000.0000 mg | ORAL_TABLET | Freq: Once | ORAL | Status: AC
  Administered 2020-03-09: 1000 mg via ORAL

## 2020-03-09 MED ORDER — SODIUM CHLORIDE 0.9% FLUSH
10.0000 mL | Freq: Once | INTRAVENOUS | Status: AC
  Administered 2020-03-09: 10 mL
  Filled 2020-03-09: qty 10

## 2020-03-09 MED ORDER — ZOLEDRONIC ACID 4 MG/100ML IV SOLN
4.0000 mg | Freq: Once | INTRAVENOUS | Status: AC
  Administered 2020-03-09: 4 mg via INTRAVENOUS

## 2020-03-09 MED ORDER — PROCHLORPERAZINE MALEATE 10 MG PO TABS
10.0000 mg | ORAL_TABLET | Freq: Once | ORAL | Status: AC
  Administered 2020-03-09: 10 mg via ORAL

## 2020-03-09 MED ORDER — ZOLEDRONIC ACID 4 MG/100ML IV SOLN
INTRAVENOUS | Status: AC
  Filled 2020-03-09: qty 100

## 2020-03-09 MED ORDER — PROCHLORPERAZINE MALEATE 10 MG PO TABS
ORAL_TABLET | ORAL | Status: AC
  Filled 2020-03-09: qty 1

## 2020-03-09 MED ORDER — SODIUM CHLORIDE 0.9 % IV SOLN
20.0000 mg | Freq: Once | INTRAVENOUS | Status: AC
  Administered 2020-03-09: 20 mg via INTRAVENOUS
  Filled 2020-03-09: qty 20

## 2020-03-09 MED ORDER — SODIUM CHLORIDE 0.9 % IV SOLN
Freq: Once | INTRAVENOUS | Status: DC
  Filled 2020-03-09: qty 250

## 2020-03-09 NOTE — Progress Notes (Signed)
HEMATOLOGY/ONCOLOGY CLINIC NOTE  Date of Service: 03/09/20     PCP: Marzetta Board MD  CHIEF COMPLAINTS/PURPOSE OF CONSULTATION:  Continued f/u for mx of myeloma   HISTORY OF PRESENTING ILLNESS:   Madison Parker is a wonderful 69 y.o. 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  Patient has a history of smoldering multiple myeloma which she reports she was diagnosed with in 2011 when she was evaluated by a hematologist in [place]. She reports that she presented with low blood counts (low WBC)   and after significant workup she had a bone marrow examination based on which she was told that she has smoldering multiple myeloma. Patient notes that she had a bone survey at that time which was negative. She was monitored there closely and then moved to [facility] in 2015 to stay with her son whose wife was being treated for breast cancer. Patient was following with Dr. Delight Hoh in [place]. She reports not having had any treatment for multiple myeloma.  Her last labs from about a year ago showed a SPEP with no OBSERVED M spike . Serum free light chains showed an elevation of kappa Free light chain 310 and lambda free light chain of 12.7 with an abnormal Kappa/ Lambda ratio of 24.38 ( up from 19 previously). Random urine showed an M protein complement of 44%.  She lives in [place] and requested transfer of care to [place]. She was offered follow-up but [facility] in [place]  But prefers to  follow [place] in [place]. Patient reports her energy levels been stable. She has chronic back pain which has improved since the patient had her spinal surgery in April 2017 (L4-L5 interbody fusion). Patient notes she has had some chronic left hip pain which is somewhat more bothersome with the navy on the lateral aspect of her upper thighs that's painful. She also notes some right hip pain.  Over the last few weeks he notes some upper neck pain especially when bending her neck backwards and tingling numbness in her right upper extremity.  Has been working on losing some weight voluntarily.  No acute other new symptoms.  AUTOLOGOUS STEM CELL TRANSPLANT:  Disease status at time of transplant: sCR MRD positive  ASBMT risk stratification: low risk, ECOG/KPS: 0 / 90%, HSCT-CI score: 0  Started mobilization on 09/03/19 using GCS-F and Plerixafor. She collected stem cells on 09/07/19. The pre-processing total was 7.18x10(6) CD34+ cells/kg. The post-processing total was 1.11x10(7) CD34+ cells/kg frozen in 3 bags.  Preparative regimen with Melphalan 200 mg/m2 on 09/14/19 (outpatient).  Infusion of stem cells - 7.4 x 10(6) on 09/15/19 (outpatient)  Admitted Day +8 for neutropenic fever, uncontrolled n/v, diarrhea  Treated with short course of prednisone for peri-engraftment syndrome  TREATMENT:  Started induction therapy with carfilozomib, lenlaidomide, and dexamethasone 28 day cycle on 04/27/2019. She had a great response to initial cycle. She developed a DVT on 06/24/19 and was started on apixaban as an anticoagulant. She had normalization of her light chains by the start of C4 on 07/28/19.  She was referred to the myeloma clinic at [facility] for BMT consult and seen on 08/10/19. Serology from her visit showed no M-spike by SPEP, a free light chain ratio of 1.07 (kappa 11.42, lambda 10.65), and an abnormal beta-2-micro of 3.02. She was started on pre-transplant evaluation while undergoing C5 of therapy.  Treatment discontinued in 07/2019 in  preparation for autologous stem cell transplant.   INTERVAL HISTORY:  Madison Parker returns today for f/u of her multiple myeloma. She is s/p HDT -Auto HSCT on 09/15/2019. Reviewed post transplant course with her based on [facility] records. The patient's last visit with [place] was on 02/10/2020. The pt reports that she is doing well overall.  The pt reports  that she had some bilateral ankle swelling after her first Carflizomib treatment that lasted for a few days. She also had lower back pain that wraped around into her pelvic region. The pain was significant for three days and was treated with OTC Tylenol. Pt still experiences the pain in the mornings and it is worse after activity.   Pt has continued taking once weekly 5000UT Vitamin D2.   Lab results today (03/09/20) of CBC w/diff and CMP is as follows: all values are WNL except for RBC at 3.29, Hgb at 10.1, HCT at 30.9, Calcium at 8.8, Total Protein at 6.0.  On review of systems, pt reports lower back/pelvic pain, and denies fevers, chills, rashes, night sweats and any other symptoms.    MEDICAL HISTORY:  Past Medical History:  Diagnosis Date  . Headache   . Low back pain   . SBO (small bowel obstruction) ([facility]) 2010  . Smoldering multiple myeloma (H[facility])   Previous history of hypothyroidism- not currently medications Anxiety Obesity .Body mass index is 33.71 kg/m. Vitamin D deficiency Smoldering multiple myeloma diagnosed in 2011 Lumbosacral radiculopathy Left hip pain  SURGICAL HISTORY: Past Surgical History:  Procedure Laterality Date  . ABDOMINAL HYSTERECTOMY     complete  . BACK SURGERY     lower at [facility]  . colonscopy  2014  . GASTRIC BYPASS  yrs ago  . HERNIA REPAIR    . IR IMAGING GUIDED PORT INSERTION  04/20/2019  . KNEE SURGERY  06/04/2009   both knees replaced   . TONSILLECTOMY  age 19  . TOTAL HIP ARTHROPLASTY Left 08/16/2017   Procedure: LEFT TOTAL HIP ARTHROPLASTY ANTERIOR APPROACH;  Surgeon: Dorna Leitz, MD;  Location: [facility] ORS;  Service: Orthopedics;  Laterality: Left;  Spinal surgery April 2017  SOCIAL HISTORY: Social History   Socioeconomic History  . Marital status: Married    Spouse name: Not on file  . Number of children: Not on file  . Years of education: Not on file  . Highest education level: Not on file  Occupational History  . Not on file    Tobacco Use  . Smoking status: Former Smoker    Packs/day: 0.50    Years: 10.00    Pack years: 5.00    Types: Cigarettes  . Smokeless tobacco: Never Used  . Tobacco comment: quit 1977  Substance and Sexual Activity  . Alcohol use: Yes    Comment: occ  . Drug use: No  . Sexual activity: Not on file  Other Topics Concern  . Not on file  Social History Narrative  . Not on file   Social Determinants of Health   Financial Resource Strain:   . Difficulty of Paying Living Expenses:   Food Insecurity:   . Worried About Charity fundraiser in the Last Year:   . Arboriculturist in the Last Year:   Transportation Needs:   . Film/video editor (Medical):   Marland Kitchen Lack of Transportation (Non-Medical):   Physical Activity:   . Days of Exercise per Week:   . Minutes of Exercise per Session:   Stress:   .  Feeling of Stress :   Social Connections:   . Frequency of Communication with Friends and Family:   . Frequency of Social Gatherings with Friends and Family:   . Attends Religious Services:   . Active Member of Clubs or Organizations:   . Attends Archivist Meetings:   Marland Kitchen Marital Status:   Intimate Partner Violence:   . Fear of Current or Ex-Partner:   . Emotionally Abused:   Marland Kitchen Physically Abused:   . Sexually Abused:   Occasional alcohol use Former smoker and smoked 1 pack per week for about 9 years has since quit.  FAMILY HISTORY: No family history on file.  ALLERGIES:  is allergic to codeine.  MEDICATIONS:  Current Outpatient Medications  Medication Sig Dispense Refill  . acyclovir (ZOVIRAX) 400 MG tablet Take 1 tablet (400 mg total) by mouth 2 (two) times daily. 180 tablet 0  . ergocalciferol (VITAMIN D2) 1.25 MG (50000 UT) capsule Take 50,000 Units by mouth once a week.    Marland Kitchen FOLIC ACID PO Take 1 tablet by mouth.    . lidocaine-prilocaine (EMLA) cream Apply 1 application topically as needed. 30 g 2  . MULTIPLE VITAMIN PO Take 1 tablet by mouth daily.    .  ondansetron (ZOFRAN) 8 MG tablet Take 1 tablet (8 mg total) by mouth 2 (two) times daily as needed (Nausea or vomiting). 30 tablet 1  . vitamin B-12 (CYANOCOBALAMIN) 1000 MCG tablet Take 5,000 mcg by mouth daily.     Marland Kitchen acetaminophen (TYLENOL) 650 MG CR tablet Take 650 mg by mouth every 8 (eight) hours as needed for pain.    Marland Kitchen apixaban (ELIQUIS) 5 MG TABS tablet Take 1 tablet (5 mg total) by mouth 2 (two) times daily. 60 tablet 2  . lenalidomide (REVLIMID) 15 MG capsule Take 1 capsule (15 mg total) by mouth daily. Take for 21 d on, 7 d off, repeat every 28d 21 capsule 0  . prochlorperazine (COMPAZINE) 10 MG tablet Take 1 tablet (10 mg total) by mouth every 6 (six) hours as needed for nausea or vomiting. 30 tablet 0  . prochlorperazine (COMPAZINE) 10 MG tablet Take 1 tablet (10 mg total) by mouth every 6 (six) hours as needed (Nausea or vomiting). 30 tablet 1   No current facility-administered medications for this visit.   Facility-Administered Medications Ordered in Other Visits  Medication Dose Route Frequency Provider Last Rate Last Admin  . sodium chloride flush (NS) 0.9 % injection 10 mL  10 mL Intracatheter PRN Brunetta Genera, MD   10 mL at 05/11/19 1437    REVIEW OF SYSTEMS:   A 10+ POINT REVIEW OF SYSTEMS WAS OBTAINED including neurology, dermatology, psychiatry, cardiac, respiratory, lymph, extremities, GI, GU, Musculoskeletal, constitutional, breasts, reproductive, HEENT.  All pertinent positives are noted in the HPI.  All others are negative.    PHYSICAL EXAMINATION: ECOG FS:1 - Symptomatic but completely ambulatory  Vitals:   03/09/20 0949  BP: (!) 143/83  Pulse: 69  Resp: 18  Temp: 97.8 F (36.6 C)  SpO2: 100%   Wt Readings from Last 3 Encounters:  03/09/20 190 lb 4.8 oz (86.3 kg)  02/24/20 186 lb 8 oz (84.6 kg)  02/10/20 188 lb 3.2 oz (85.4 kg)   Body mass index is 33.71 kg/m.    GENERAL:alert, in no acute distress and comfortable SKIN: no acute rashes, no  significant lesions EYES: conjunctiva are pink and non-injected, sclera anicteric OROPHARYNX: MMM, no exudates, no oropharyngeal erythema or ulceration NECK:  supple, no JVD LYMPH:  no palpable lymphadenopathy in the cervical, axillary or inguinal regions LUNGS: clear to auscultation b/l with normal respiratory effort HEART: regular rate & rhythm ABDOMEN:  normoactive bowel sounds , non tender, not distended. No palpable hepatosplenomegaly.  Extremity: no pedal edema PSYCH: alert & oriented x 3 with fluent speech NEURO: no focal motor/sensory deficits  LABORATORY DATA:  I have reviewed the data as listed  . CBC Latest Ref Rng & Units 03/09/2020 02/24/2020 02/10/2020  WBC 4.0 - 10.5 K/uL 4.1 4.1 2.8(L)  Hemoglobin 12.0 - 15.0 g/dL 10.1(L) 10.8(L) 10.7(L)  Hematocrit 36.0 - 46.0 % 30.9(L) 33.4(L) 34.0(L)  Platelets 150 - 400 K/uL 171 177 157  ANC 1300 . CBC    Component Value Date/Time   WBC 4.1 03/09/2020 0935   RBC 3.29 (L) 03/09/2020 0935   HGB 10.1 (L) 03/09/2020 0935   HGB 11.3 (L) 11/12/2019 0836   HGB 11.3 (L) 09/30/2017 1145   HCT 30.9 (L) 03/09/2020 0935   HCT 35.1 09/30/2017 1145   PLT 171 03/09/2020 0935   PLT 149 (L) 11/12/2019 0836   PLT 172 09/30/2017 1145   MCV 93.9 03/09/2020 0935   MCV 97.5 09/30/2017 1145   MCH 30.7 03/09/2020 0935   MCHC 32.7 03/09/2020 0935   RDW 12.9 03/09/2020 0935   RDW 14.0 09/30/2017 1145   LYMPHSABS 1.3 03/09/2020 0935   LYMPHSABS 1.3 09/30/2017 1145   MONOABS 0.4 03/09/2020 0935   MONOABS 0.2 09/30/2017 1145   EOSABS 0.1 03/09/2020 0935   EOSABS 0.0 09/30/2017 1145   EOSABS 0.1 06/03/2014 1003   BASOSABS 0.0 03/09/2020 0935   BASOSABS 0.0 09/30/2017 1145     . CMP Latest Ref Rng & Units 03/09/2020 02/24/2020 02/10/2020  Glucose 70 - 99 mg/dL 83 103(H) 87  BUN 8 - 23 mg/dL 8 11 11   Creatinine 0.44 - 1.00 mg/dL 0.77 0.82 0.83  Sodium 135 - 145 mmol/L 141 142 141  Potassium 3.5 - 5.1 mmol/L 3.7 3.7 4.5  Chloride 98 - 111  mmol/L 111 112(H) 109  CO2 22 - 32 mmol/L 23 22 24   Calcium 8.9 - 10.3 mg/dL 8.8(L) 9.1 9.3  Total Protein 6.5 - 8.1 g/dL 6.0(L) 6.4(L) 6.6  Total Bilirubin 0.3 - 1.2 mg/dL 0.9 0.9 0.9  Alkaline Phos 38 - 126 U/L 107 121 112  AST 15 - 41 U/L 26 23 25   ALT 0 - 44 U/L 26 22 22          Component     Latest Ref Rng & Units 11/08/2016  LDH     125 - 245 U/L 220  Beta 2     0.6 - 2.4 mg/L 2.5 (H)   Component     Latest Ref Rng & Units 07/30/2017  Ferritin     9 - 269 ng/ml 81  Vitamin B12     232 - 1,245 pg/mL 594   Magnesium: Component Magnesium  Latest Ref Rng & Units 1.7 - 2.4 mg/dL  10/02/2019 1.7  10/21/2019 1.7  11/12/2019 1.7    03/11/19 BM Bx:   03/11/19 Cytogenetics:            RADIOGRAPHIC STUDIES: I have personally reviewed the radiological images as listed and agreed with the findings in the report. No results found.   Bone survey 11/08/2016 IMPRESSION: 1. Small lytic lesion in the right scapula. 2. Possible small lytic lesion in the left scapula. 3. Postoperative changes. 4. Significant degenerative changes in both hips,  left greater than right. 5. No evidence for acute fracture   Electronically Signed   By: Nolon Nations M.D.   On: 11/08/2016 16:19   ASSESSMENT & PLAN:   69 y.o. very pleasant lady with history of   1) Multiple myeloma (concern for small lytic lesions in left and right scapulae) - (appears light chain producing) and Progressive anemia  This was apparently diagnosed as smoldering myeloma in 2011 by her hematologist in [place].  No renal failure/hypercalcemia.  Bone survey with concern for possible small lytic lesions in B/L scapulae. PET/CT scan on 03/12/2017 showed no concerning bone lesions.  Initial Bm Bx with 17% kappa restricted plasma cells consistent with plasma cell neoplasm.  No treatment so far.  03/11/19 BM Bx revealed involvement by 50% plasma cells; genetics -high risk t(14;16), previous  genetics from April 2018 BM Bx were standard risk  03/16/19 MRI Lumbar reveals several explanations for her lower back pain including L2-L3 stenosis, but reassuringly, there was not evidence of involvement by multiple myeloma  04/01/19 PET/CT which revealed no FDG evidence of active multiple myeloma within the skeleton. No evidence of lytic lesions within the skeleton or soft tissue Plasmacytoma.  05/07/2019 DXA scan revealed osteopenia, T-score of -1.2.  2) Chronic leukopenia - ? Related to SMM vs other nutritional deficiencies (h/o gastric bypass surgery put her at risk for nutritional deficiencies)  B12 levels WNL  Ferritin adequate  ?additional factor -recent NSAID use.  ?element of benign ethnic neutropenia.  Has not had any issues with frequent infection.   ANC post-operative normalized today at 1700  4) Neuropathy/radiculopathy -Continue follow up with orthopedics  5) Superficial Venous Thrombosis of the left small saphenous vein  06/24/2019 US venous left : Right: There is no evidence of deep vein thrombosis in the lower extremity. No cystic structure found in the popliteal fossa. Left: Findings consistent with acute superficial vein thrombosis involving the left small saphenous vein. There is no evidence of deep vein thrombosis in the lower extremity. However, portions of this examination were limited- see technologist comments above. No cystic structure found in the popliteal fossa  PLAN: -Discussed pt labwork today, 03/09/20; mild anemia, WBC and PLT are nml, blood chemistries are steady -The pt has no prohibitive toxicities from continuing Carflizomib maintenance q2weeks at this time.  -Will increase dose of Carflizomib to 27 mg/m^2 for today's treatment. Plan to continually increase dosage until at 56 mg/m^2 (standard dose).  -Will continue to monitor her back/pelvic pain. If pain continues to be bothersome may consider scanning the area.  -Recommend pt limit sodium intake  and wear sports compression socks to improve leg swelling  -Continue Acyclovir as prescribed -Continue weekly Ergocalciferol -Continue Zometa q69month -Will see back in 4 weeks with labs    FOLLOW UP: F/u for next 2 treatments with Carfilzomib with port flush and labs as schedule Plz add MD visit in 4 weeks with treatment on 6/23 Continue Zometa q12weeks -- plz schedule next 4 infusions   The total time spent in the appt was 30 minutes and more than 50% was on counseling and direct patient cares, ordering and mx of antineoplastic treatments.  All of the patient's questions were answered with apparent satisfaction. The patient knows to call the clinic with any problems, questions or concerns.    GSullivan LoneMD MAllentownAAHIVMS SMercy Hospital ArdmoreCMadison Va Medical CenterHematology/Oncology Physician CSanford Bemid[facility] (Office):       [phone](Work cell):  [phone](Fax):  [phone]  I, Yevette Edwards, am acting as a scribe for Dr. Sullivan Lone.   .I have reviewed the above documentation for accuracy and completeness, and I agree with the above. Brunetta Genera MD

## 2020-03-09 NOTE — Patient Instructions (Signed)

## 2020-03-09 NOTE — Progress Notes (Addendum)
Pt has been seen by dentist.  Dr Irene Limbo wishes to proceed with Zometa infusion today. Pt will resume taking her Calcium supplement.  Hardie Pulley, PharmD, BCPS, BCOP

## 2020-03-09 NOTE — Patient Instructions (Addendum)
[facility] Discharge Instructions for Patients Receiving Chemotherapy  Today you received the following chemotherapy agents Kyprolis  To help prevent nausea and vomiting after your treatment, we encourage you to take your nausea medication as directed.    If you develop nausea and vomiting that is not controlled by your nausea medication, call the clinic.   BELOW ARE SYMPTOMS THAT SHOULD BE REPORTED IMMEDIATELY:  *FEVER GREATER THAN 100.5 F  *CHILLS WITH OR WITHOUT FEVER  NAUSEA AND VOMITING THAT IS NOT CONTROLLED WITH YOUR NAUSEA MEDICATION  *UNUSUAL SHORTNESS OF BREATH  *UNUSUAL BRUISING OR BLEEDING  TENDERNESS IN MOUTH AND THROAT WITH OR WITHOUT PRESENCE OF ULCERS  *URINARY PROBLEMS  *BOWEL PROBLEMS  UNUSUAL RASH Items with * indicate a potential emergency and should be followed up as soon as possible.  Feel free to call the clinic should you have any questions or concerns. The clinic phone number is [phone].  Please show the CHEMO ALERT CARD at check-in to the Emergency Department and triage nurse.  Zoledronic Acid injection (Hypercalcemia, Oncology) What is this medicine? ZOLEDRONIC ACID (ZOE le dron ik AS id) lowers the amount of calcium loss from bone. It is used to treat too much calcium in your blood from cancer. It is also used to prevent complications of cancer that has spread to the bone. This medicine may be used for other purposes; ask your health care provider or pharmacist if you have questions. COMMON BRAND NAME(S): Zometa What should I tell my health care provider before I take this medicine? They need to know if you have any of these conditions:  aspirin-sensitive asthma  cancer, especially if you are receiving medicines used to treat cancer  dental disease or wear dentures  infection  kidney disease  receiving corticosteroids like dexamethasone or prednisone  an unusual or allergic reaction to zoledronic acid, other  medicines, foods, dyes, or preservatives  pregnant or trying to get pregnant  breast-feeding How should I use this medicine? This medicine is for infusion into a vein. It is given by a health care professional in a hospital or clinic setting. Talk to your pediatrician regarding the use of this medicine in children. Special care may be needed. Overdosage: If you think you have taken too much of this medicine contact a poison control center or emergency room at once. NOTE: This medicine is only for you. Do not share this medicine with others. What if I miss a dose? It is important not to miss your dose. Call your doctor or health care professional if you are unable to keep an appointment. What may interact with this medicine?  certain antibiotics given by injection  NSAIDs, medicines for pain and inflammation, like ibuprofen or naproxen  some diuretics like bumetanide, furosemide  teriparatide  thalidomide This list may not describe all possible interactions. Give your health care provider a list of all the medicines, herbs, non-prescription drugs, or dietary supplements you use. Also tell them if you smoke, drink alcohol, or use illegal drugs. Some items may interact with your medicine. What should I watch for while using this medicine? Visit your doctor or health care professional for regular checkups. It may be some time before you see the benefit from this medicine. Do not stop taking your medicine unless your doctor tells you to. Your doctor may order blood tests or other tests to see how you are doing. Women should inform their doctor if they wish to become pregnant or think they might be   pregnant. There is a potential for serious side effects to an unborn child. Talk to your health care professional or pharmacist for more information. You should make sure that you get enough calcium and vitamin D while you are taking this medicine. Discuss the foods you eat and the vitamins you take  with your health care professional. Some people who take this medicine have severe bone, joint, and/or muscle pain. This medicine may also increase your risk for jaw problems or a broken thigh bone. Tell your doctor right away if you have severe pain in your jaw, bones, joints, or muscles. Tell your doctor if you have any pain that does not go away or that gets worse. Tell your dentist and dental surgeon that you are taking this medicine. You should not have major dental surgery while on this medicine. See your dentist to have a dental exam and fix any dental problems before starting this medicine. Take good care of your teeth while on this medicine. Make sure you see your dentist for regular follow-up appointments. What side effects may I notice from receiving this medicine? Side effects that you should report to your doctor or health care professional as soon as possible:  allergic reactions like skin rash, itching or hives, swelling of the face, lips, or tongue  anxiety, confusion, or depression  breathing problems  changes in vision  eye pain  feeling faint or lightheaded, falls  jaw pain, especially after dental work  mouth sores  muscle cramps, stiffness, or weakness  redness, blistering, peeling or loosening of the skin, including inside the mouth  trouble passing urine or change in the amount of urine Side effects that usually do not require medical attention (report to your doctor or health care professional if they continue or are bothersome):  bone, joint, or muscle pain  constipation  diarrhea  fever  hair loss  irritation at site where injected  loss of appetite  nausea, vomiting  stomach upset  trouble sleeping  trouble swallowing  weak or tired This list may not describe all possible side effects. Call your doctor for medical advice about side effects. You may report side effects to FDA at 1-800-FDA-1088. Where should I keep my medicine? This drug  is given in a hospital or clinic and will not be stored at home. NOTE: This sheet is a summary. It may not cover all possible information. If you have questions about this medicine, talk to your doctor, pharmacist, or health care provider.  2020 Elsevier/Gold Standard (2014-02-27 14:19:39)  

## 2020-03-16 DIAGNOSIS — Z792 Long term (current) use of antibiotics: Secondary | ICD-10-CM | POA: Diagnosis not present

## 2020-03-16 DIAGNOSIS — Z79899 Other long term (current) drug therapy: Secondary | ICD-10-CM | POA: Diagnosis not present

## 2020-03-16 DIAGNOSIS — M549 Dorsalgia, unspecified: Secondary | ICD-10-CM | POA: Diagnosis not present

## 2020-03-16 DIAGNOSIS — R634 Abnormal weight loss: Secondary | ICD-10-CM | POA: Diagnosis not present

## 2020-03-16 DIAGNOSIS — C9000 Multiple myeloma not having achieved remission: Secondary | ICD-10-CM | POA: Diagnosis not present

## 2020-03-16 DIAGNOSIS — Z23 Encounter for immunization: Secondary | ICD-10-CM | POA: Diagnosis not present

## 2020-03-16 DIAGNOSIS — Z9484 Stem cells transplant status: Secondary | ICD-10-CM | POA: Diagnosis not present

## 2020-03-16 DIAGNOSIS — Z7983 Long term (current) use of bisphosphonates: Secondary | ICD-10-CM | POA: Diagnosis not present

## 2020-03-16 DIAGNOSIS — C9001 Multiple myeloma in remission: Secondary | ICD-10-CM | POA: Diagnosis not present

## 2020-03-17 NOTE — Progress Notes (Signed)
Pharmacist Chemotherapy Monitoring - Follow Up Assessment    I verify that I have reviewed each item in the below checklist:  . Regimen for the patient is scheduled for the appropriate day and plan matches scheduled date. Marland Kitchen Appropriate non-routine labs are ordered dependent on drug ordered. . If applicable, additional medications reviewed and ordered per protocol based on lifetime cumulative doses and/or treatment regimen.   Plan for follow-up and/or issues identified: No . I-vent associated with next due treatment: No . MD and/or nursing notified: No  Madison Parker 03/17/2020 8:15 AM

## 2020-03-23 ENCOUNTER — Inpatient Hospital Stay: Payer: Medicare Other

## 2020-03-23 ENCOUNTER — Other Ambulatory Visit: Payer: Medicare Other

## 2020-03-23 ENCOUNTER — Other Ambulatory Visit: Payer: Self-pay | Admitting: Hematology

## 2020-03-23 ENCOUNTER — Inpatient Hospital Stay: Payer: Medicare Other | Attending: Hematology

## 2020-03-23 ENCOUNTER — Other Ambulatory Visit: Payer: Self-pay

## 2020-03-23 VITALS — BP 114/68 | HR 79 | Temp 98.5°F | Resp 18

## 2020-03-23 DIAGNOSIS — C9000 Multiple myeloma not having achieved remission: Secondary | ICD-10-CM | POA: Diagnosis not present

## 2020-03-23 DIAGNOSIS — Z7189 Other specified counseling: Secondary | ICD-10-CM

## 2020-03-23 DIAGNOSIS — Z5112 Encounter for antineoplastic immunotherapy: Secondary | ICD-10-CM | POA: Diagnosis not present

## 2020-03-23 DIAGNOSIS — Z95828 Presence of other vascular implants and grafts: Secondary | ICD-10-CM

## 2020-03-23 LAB — CBC WITH DIFFERENTIAL/PLATELET
Abs Immature Granulocytes: 0.01 10*3/uL (ref 0.00–0.07)
Basophils Absolute: 0 10*3/uL (ref 0.0–0.1)
Basophils Relative: 0 %
Eosinophils Absolute: 0.1 10*3/uL (ref 0.0–0.5)
Eosinophils Relative: 2 %
HCT: 34.4 % — ABNORMAL LOW (ref 36.0–46.0)
Hemoglobin: 11.1 g/dL — ABNORMAL LOW (ref 12.0–15.0)
Immature Granulocytes: 0 %
Lymphocytes Relative: 44 %
Lymphs Abs: 1.7 10*3/uL (ref 0.7–4.0)
MCH: 30.7 pg (ref 26.0–34.0)
MCHC: 32.3 g/dL (ref 30.0–36.0)
MCV: 95.3 fL (ref 80.0–100.0)
Monocytes Absolute: 0.4 10*3/uL (ref 0.1–1.0)
Monocytes Relative: 10 %
Neutro Abs: 1.6 10*3/uL — ABNORMAL LOW (ref 1.7–7.7)
Neutrophils Relative %: 44 %
Platelets: 179 10*3/uL (ref 150–400)
RBC: 3.61 MIL/uL — ABNORMAL LOW (ref 3.87–5.11)
RDW: 12.7 % (ref 11.5–15.5)
WBC: 3.7 10*3/uL — ABNORMAL LOW (ref 4.0–10.5)
nRBC: 0 % (ref 0.0–0.2)

## 2020-03-23 LAB — CMP (CANCER CENTER ONLY)
ALT: 23 U/L (ref 0–44)
AST: 22 U/L (ref 15–41)
Albumin: 3.8 g/dL (ref 3.5–5.0)
Alkaline Phosphatase: 133 U/L — ABNORMAL HIGH (ref 38–126)
Anion gap: 12 (ref 5–15)
BUN: 11 mg/dL (ref 8–23)
CO2: 19 mmol/L — ABNORMAL LOW (ref 22–32)
Calcium: 8.8 mg/dL — ABNORMAL LOW (ref 8.9–10.3)
Chloride: 111 mmol/L (ref 98–111)
Creatinine: 0.8 mg/dL (ref 0.44–1.00)
GFR, Est AFR Am: >60 mL/min (ref >60)
GFR, Estimated: >60 mL/min (ref >60)
Glucose, Bld: 77 mg/dL (ref 70–99)
Potassium: 4.1 mmol/L (ref 3.5–5.1)
Sodium: 142 mmol/L (ref 135–145)
Total Bilirubin: 0.7 mg/dL (ref 0.3–1.2)
Total Protein: 6.7 g/dL (ref 6.5–8.1)

## 2020-03-23 MED ORDER — DEXTROSE 5 % IV SOLN
35.0000 mg/m2 | Freq: Once | INTRAVENOUS | Status: AC
  Administered 2020-03-23: 70 mg via INTRAVENOUS
  Filled 2020-03-23: qty 30

## 2020-03-23 MED ORDER — ACETAMINOPHEN 500 MG PO TABS
ORAL_TABLET | ORAL | Status: AC
  Filled 2020-03-23: qty 2

## 2020-03-23 MED ORDER — HEPARIN SOD (PORK) LOCK FLUSH 100 UNIT/ML IV SOLN
500.0000 [IU] | Freq: Once | INTRAVENOUS | Status: AC | PRN
  Administered 2020-03-23: 500 [IU]
  Filled 2020-03-23: qty 5

## 2020-03-23 MED ORDER — PROCHLORPERAZINE MALEATE 10 MG PO TABS
ORAL_TABLET | ORAL | Status: AC
  Filled 2020-03-23: qty 1

## 2020-03-23 MED ORDER — SODIUM CHLORIDE 0.9% FLUSH
10.0000 mL | Freq: Once | INTRAVENOUS | Status: AC
  Administered 2020-03-23: 10 mL
  Filled 2020-03-23: qty 10

## 2020-03-23 MED ORDER — ACETAMINOPHEN 500 MG PO TABS
1000.0000 mg | ORAL_TABLET | Freq: Once | ORAL | Status: AC
  Administered 2020-03-23: 1000 mg via ORAL

## 2020-03-23 MED ORDER — PROCHLORPERAZINE MALEATE 10 MG PO TABS
10.0000 mg | ORAL_TABLET | Freq: Once | ORAL | Status: AC
  Administered 2020-03-23: 10 mg via ORAL

## 2020-03-23 MED ORDER — SODIUM CHLORIDE 0.9 % IV SOLN
Freq: Once | INTRAVENOUS | Status: AC
  Filled 2020-03-23: qty 250

## 2020-03-23 MED ORDER — SODIUM CHLORIDE 0.9% FLUSH
10.0000 mL | INTRAVENOUS | Status: DC | PRN
  Administered 2020-03-23: 10 mL
  Filled 2020-03-23: qty 10

## 2020-03-23 MED ORDER — SODIUM CHLORIDE 0.9 % IV SOLN
20.0000 mg | Freq: Once | INTRAVENOUS | Status: AC
  Administered 2020-03-23: 20 mg via INTRAVENOUS
  Filled 2020-03-23: qty 20

## 2020-03-23 NOTE — Patient Instructions (Signed)
Cancer Center Discharge Instructions for Patients Receiving Chemotherapy  Today you received the following chemotherapy agents: carfilzomib.  To help prevent nausea and vomiting after your treatment, we encourage you to take your nausea medication as directed.   If you develop nausea and vomiting that is not controlled by your nausea medication, call the clinic.   BELOW ARE SYMPTOMS THAT SHOULD BE REPORTED IMMEDIATELY:  *FEVER GREATER THAN 100.5 F  *CHILLS WITH OR WITHOUT FEVER  NAUSEA AND VOMITING THAT IS NOT CONTROLLED WITH YOUR NAUSEA MEDICATION  *UNUSUAL SHORTNESS OF BREATH  *UNUSUAL BRUISING OR BLEEDING  TENDERNESS IN MOUTH AND THROAT WITH OR WITHOUT PRESENCE OF ULCERS  *URINARY PROBLEMS  *BOWEL PROBLEMS  UNUSUAL RASH Items with * indicate a potential emergency and should be followed up as soon as possible.  Feel free to call the clinic should you have any questions or concerns. The clinic phone number is [phone].  Please show the CHEMO ALERT CARD at check-in to the Emergency Department and triage nurse.   

## 2020-04-05 NOTE — Progress Notes (Signed)
HEMATOLOGY/ONCOLOGY CLINIC NOTE  Date of Service: 04/06/20     PCP: Marzetta Board MD  CHIEF COMPLAINTS/PURPOSE OF CONSULTATION:  Continued f/u for mx of myeloma   HISTORY OF PRESENTING ILLNESS:   Madison Parker is a wonderful 69 y.o. 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  Patient has a history of smoldering multiple myeloma which she reports she was diagnosed with in 2011 when she was evaluated by a hematologist in [place]. She reports that she presented with low blood counts (low WBC)   and after significant workup she had a bone marrow examination based on which she was told that she has smoldering multiple myeloma. Patient notes that she had a bone survey at that time which was negative. She was monitored there closely and then moved to [facility] in 2015 to stay with her son whose wife was being treated for breast cancer. Patient was following with Dr. Delight Hoh in [place]. She reports not having had any treatment for multiple myeloma.  Her last labs from about a year ago showed a SPEP with no OBSERVED M spike . Serum free light chains showed an elevation of kappa Free light chain 310 and lambda free light chain of 12.7 with an abnormal Kappa/ Lambda ratio of 24.38 ( up from 19 previously). Random urine showed an M protein complement of 44%.  She lives in [place] and requested transfer of care to [place]. She was offered follow-up but [facility] in [place]  But prefers to  follow [place] in [place]. Patient reports her energy levels been stable. She has chronic back pain which has improved since the patient had her spinal surgery in April 2017 (L4-L5 interbody fusion). Patient notes she has had some chronic left hip pain which is somewhat more bothersome with the navy on the lateral aspect of her upper thighs that's painful. She also notes some right hip pain.  Over the last few weeks he notes some upper neck pain especially when bending her neck backwards and tingling numbness in her right upper extremity.  Has been working on losing some weight voluntarily.  No acute other new symptoms.  AUTOLOGOUS STEM CELL TRANSPLANT:  Disease status at time of transplant: sCR MRD positive  ASBMT risk stratification: low risk, ECOG/KPS: 0 / 90%, HSCT-CI score: 0  Started mobilization on 09/03/19 using GCS-F and Plerixafor. She collected stem cells on 09/07/19. The pre-processing total was 7.18x10(6) CD34+ cells/kg. The post-processing total was 1.11x10(7) CD34+ cells/kg frozen in 3 bags.  Preparative regimen with Melphalan 200 mg/m2 on 09/14/19 (outpatient).  Infusion of stem cells - 7.4 x 10(6) on 09/15/19 (outpatient)  Admitted Day +8 for neutropenic fever, uncontrolled n/v, diarrhea  Treated with short course of prednisone for peri-engraftment syndrome  TREATMENT:  Started induction therapy with carfilozomib, lenlaidomide, and dexamethasone 28 day cycle on 04/27/2019. She had a great response to initial cycle. She developed a DVT on 06/24/19 and was started on apixaban as an anticoagulant. She had normalization of her light chains by the start of C4 on 07/28/19.  She was referred to the myeloma clinic at [facility] for BMT consult and seen on 08/10/19. Serology from her visit showed no M-spike by SPEP, a free light chain ratio of 1.07 (kappa 11.42, lambda 10.65), and an abnormal beta-2-micro of 3.02. She was started on pre-transplant evaluation while undergoing C5 of therapy.  Treatment discontinued in 07/2019 in  preparation for autologous stem cell transplant.   INTERVAL HISTORY:   Madison Parker returns today for f/u of her multiple myeloma. She is s/p HDT -Auto HSCT on 09/15/2019. Reviewed post transplant course with her based on [facility] records. She is here for maintenance Carfilzomib. The patient's last visit with [place] was on 03/09/2020. The pt reports that  she is doing well overall.  The pt reports that she is tired the day of and the day after treatment, but does not experience much fatigue outside of those days. Pt was recently placed on a calcium supplement. She continues taking 50K IU Vitamin D weekly and Vitamin V42 and Folic acid daily.   Lab results today (04/06/20) of CBC w/diff and CMP is as follows: all values are WNL except for WBC at 3.8K, RBC at 3.42, Hgb at 10.4, HCT at 32.0, Neutro Abs at 1.3K, CO2 at 21, Calcium at 8.5, Total Protein at 6.2. 04/06/2020 K/L light chains are in progress  04/06/2020 MMP is in progress  On review of systems, pt reports chronic back pain and denies fatigue, fevers, chills, night sweats, nausea, abdominal pain and any other symptoms.    MEDICAL HISTORY:  Past Medical History:  Diagnosis Date  . Headache   . Low back pain   . SBO (small bowel obstruction) ([facility]) 2010  . Smoldering multiple myeloma ([facility])   Previous history of hypothyroidism- not currently medications Anxiety Obesity .Body mass index is 33.21 kg/m. Vitamin D deficiency Smoldering multiple myeloma diagnosed in 2011 Lumbosacral radiculopathy Left hip pain  SURGICAL HISTORY: Past Surgical History:  Procedure Laterality Date  . ABDOMINAL HYSTERECTOMY     complete  . BACK SURGERY     lower at [facility]  . colonscopy  2014  . GASTRIC BYPASS  yrs ago  . HERNIA REPAIR    . IR IMAGING GUIDED PORT INSERTION  04/20/2019  . KNEE SURGERY  06/04/2009   both knees replaced   . TONSILLECTOMY  age 60  . TOTAL HIP ARTHROPLASTY Left 08/16/2017   Procedure: LEFT TOTAL HIP ARTHROPLASTY ANTERIOR APPROACH;  Surgeon: Dorna Leitz, MD;  Location: [facility];  Service: Orthopedics;  Laterality: Left;  Spinal surgery April 2017  SOCIAL HISTORY: Social History   Socioeconomic History  . Marital status: Married    Spouse name: Not on file  . Number of children: Not on file  . Years of education: Not on file  . Highest education level: Not on file    Occupational History  . Not on file  Tobacco Use  . Smoking status: Former Smoker    Packs/day: 0.50    Years: 10.00    Pack years: 5.00    Types: Cigarettes  . Smokeless tobacco: Never Used  . Tobacco comment: quit 1977  Vaping Use  . Vaping Use: Never used  Substance and Sexual Activity  . Alcohol use: Yes    Comment: occ  . Drug use: No  . Sexual activity: Not on file  Other Topics Concern  . Not on file  Social History Narrative  . Not on file   Social Determinants of Health   Financial Resource Strain:   . Difficulty of Paying Living Expenses:   Food Insecurity:   . Worried About Charity fundraiser in the Last Year:   . Arboriculturist in the Last Year:   Transportation Needs:   . Film/video editor (Medical):   Marland Kitchen Lack of Transportation (Non-Medical):   Physical Activity:   . Days  of Exercise per Week:   . Minutes of Exercise per Session:   Stress:   . Feeling of Stress :   Social Connections:   . Frequency of Communication with Friends and Family:   . Frequency of Social Gatherings with Friends and Family:   . Attends Religious Services:   . Active Member of Clubs or Organizations:   . Attends Archivist Meetings:   Marland Kitchen Marital Status:   Intimate Partner Violence:   . Fear of Current or Ex-Partner:   . Emotionally Abused:   Marland Kitchen Physically Abused:   . Sexually Abused:   Occasional alcohol use Former smoker and smoked 1 pack per week for about 9 years has since quit.  FAMILY HISTORY: No family history on file.  ALLERGIES:  is allergic to codeine.  MEDICATIONS:  Current Outpatient Medications  Medication Sig Dispense Refill  . acyclovir (ZOVIRAX) 400 MG tablet Take 1 tablet (400 mg total) by mouth 2 (two) times daily. 180 tablet 0  . Calcium Carbonate (CALCIUM 600 PO) Take 1 tablet by mouth daily.    . ergocalciferol (VITAMIN D2) 1.25 MG (50000 UT) capsule Take 50,000 Units by mouth once a week.    Marland Kitchen FOLIC ACID PO Take 1 tablet by mouth.     . lidocaine-prilocaine (EMLA) cream Apply 1 application topically as needed. 30 g 2  . MULTIPLE VITAMIN PO Take 1 tablet by mouth daily.    . ondansetron (ZOFRAN) 8 MG tablet Take 1 tablet (8 mg total) by mouth 2 (two) times daily as needed (Nausea or vomiting). 30 tablet 1  . prochlorperazine (COMPAZINE) 10 MG tablet Take 1 tablet (10 mg total) by mouth every 6 (six) hours as needed for nausea or vomiting. 30 tablet 0  . TURMERIC-GINGER PO Take 1 each by mouth daily. Takes 1 per day    . vitamin B-12 (CYANOCOBALAMIN) 1000 MCG tablet Take 5,000 mcg by mouth daily.      No current facility-administered medications for this visit.   Facility-Administered Medications Ordered in Other Visits  Medication Dose Route Frequency Provider Last Rate Last Admin  . sodium chloride flush (NS) 0.9 % injection 10 mL  10 mL Intracatheter PRN Brunetta Genera, MD   10 mL at 05/11/19 1437    REVIEW OF SYSTEMS:   A 10+ POINT REVIEW OF SYSTEMS WAS OBTAINED including neurology, dermatology, psychiatry, cardiac, respiratory, lymph, extremities, GI, GU, Musculoskeletal, constitutional, breasts, reproductive, HEENT.  All pertinent positives are noted in the HPI.  All others are negative.   PHYSICAL EXAMINATION: ECOG FS:1 - Symptomatic but completely ambulatory  Vitals:   04/06/20 0909  BP: (!) 146/85  Pulse: 71  Resp: 20  Temp: (!) 97.3 F (36.3 C)  SpO2: 100%   Wt Readings from Last 3 Encounters:  04/06/20 187 lb 8 oz (85 kg)  03/09/20 190 lb 4.8 oz (86.3 kg)  02/24/20 186 lb 8 oz (84.6 kg)   Body mass index is 33.21 kg/m.    GENERAL:alert, in no acute distress and comfortable SKIN: no acute rashes, no significant lesions EYES: conjunctiva are pink and non-injected, sclera anicteric OROPHARYNX: MMM, no exudates, no oropharyngeal erythema or ulceration NECK: supple, no JVD LYMPH:  no palpable lymphadenopathy in the cervical, axillary or inguinal regions LUNGS: clear to auscultation b/l with  normal respiratory effort HEART: regular rate & rhythm ABDOMEN:  normoactive bowel sounds , non tender, not distended. No palpable hepatosplenomegaly.  Extremity: no pedal edema PSYCH: alert & oriented x 3 with  fluent speech NEURO: no focal motor/sensory deficits  LABORATORY DATA:  I have reviewed the data as listed  . CBC Latest Ref Rng & Units 04/06/2020 03/23/2020 03/09/2020  WBC 4.0 - 10.5 K/uL 3.8(L) 3.7(L) 4.1  Hemoglobin 12.0 - 15.0 g/dL 10.4(L) 11.1(L) 10.1(L)  Hematocrit 36 - 46 % 32.0(L) 34.4(L) 30.9(L)  Platelets 150 - 400 K/uL 166 179 171  ANC 1300 . CBC    Component Value Date/Time   WBC 3.8 (L) 04/06/2020 0851   RBC 3.42 (L) 04/06/2020 0851   HGB 10.4 (L) 04/06/2020 0851   HGB 11.3 (L) 11/12/2019 0836   HGB 11.3 (L) 09/30/2017 1145   HCT 32.0 (L) 04/06/2020 0851   HCT 35.1 09/30/2017 1145   PLT 166 04/06/2020 0851   PLT 149 (L) 11/12/2019 0836   PLT 172 09/30/2017 1145   MCV 93.6 04/06/2020 0851   MCV 97.5 09/30/2017 1145   MCH 30.4 04/06/2020 0851   MCHC 32.5 04/06/2020 0851   RDW 13.2 04/06/2020 0851   RDW 14.0 09/30/2017 1145   LYMPHSABS 2.0 04/06/2020 0851   LYMPHSABS 1.3 09/30/2017 1145   MONOABS 0.4 04/06/2020 0851   MONOABS 0.2 09/30/2017 1145   EOSABS 0.1 04/06/2020 0851   EOSABS 0.0 09/30/2017 1145   EOSABS 0.1 06/03/2014 1003   BASOSABS 0.0 04/06/2020 0851   BASOSABS 0.0 09/30/2017 1145     . CMP Latest Ref Rng & Units 04/06/2020 03/23/2020 03/09/2020  Glucose 70 - 99 mg/dL 82 77 83  BUN 8 - 23 mg/dL _0 Creatinine 0.44 - 1.00 mg/dL 0.79 0.80 0.77  Sodium 135 - 145 mmol/L 139 142 141  Potassium 3.5 - 5.1 mmol/L 3.9 4.1 3.7  Chloride 98 - 111 mmol/L 111 111 111  CO2 22 - 32 mmol/L 21(L) 19(L) 23  Calcium 8.9 - 10.3 mg/dL 8.5(L) 8.8(L) 8.8(L)  Total Protein 6.5 - 8.1 g/dL 6.2(L) 6.7 6.0(L)  Total Bilirubin 0.3 - 1.2 mg/dL 1.2 0.7 0.9  Alkaline Phos 38 - 126 U/L 115 133(H) 107  AST 15 - 41 U/L _1 ALT 0 - 44 U/L _2 Component     Latest Ref Rng & Units 11/08/2016  LDH     125 - 245 U/L 220  Beta 2     0.6 - 2.4 mg/L 2.5 (H)   Component     Latest Ref Rng & Units 07/30/2017  Ferritin     9 - 269 ng/ml 81  Vitamin B12     232 - 1,245 pg/mL 594   Magnesium: Component Magnesium  Latest Ref Rng & Units 1.7 - 2.4 mg/dL  10/02/2019 1.7  10/21/2019 1.7  11/12/2019 1.7    03/11/19 BM Bx:   03/11/19 Cytogenetics:            RADIOGRAPHIC STUDIES: I have personally reviewed the radiological images as listed and agreed with the findings in the report. No results found.   Bone survey 11/08/2016 IMPRESSION: 1. Small lytic lesion in the right scapula. 2. Possible small lytic lesion in the left scapula. 3. Postoperative changes. 4. Significant degenerative changes in both hips, left greater than right. 5. No evidence for acute fracture   Electronically Signed   By: Nolon Nations M.D.   On: 11/08/2016 16:19   ASSESSMENT & PLAN:   69 y.o. very pleasant lady with history of   1) Multiple myeloma (concern for small lytic lesions in left  and right scapulae) - (appears light chain producing) and Progressive anemia  This was apparently diagnosed as smoldering myeloma in 2011 by her hematologist in [place].  No renal failure/hypercalcemia.  Bone survey with concern for possible small lytic lesions in B/L scapulae. PET/CT scan on 03/12/2017 showed no concerning bone lesions.  Initial Bm Bx with 17% kappa restricted plasma cells consistent with plasma cell neoplasm.  No treatment so far.  03/11/19 BM Bx revealed involvement by 50% plasma cells; genetics -high risk t(14;16), previous genetics from April 2018 BM Bx were standard risk  03/16/19 MRI Lumbar reveals several explanations for her lower back pain including L2-L3 stenosis, but reassuringly, there was not evidence of involvement by multiple myeloma  04/01/19 PET/CT which revealed no FDG evidence of active multiple  myeloma within the skeleton. No evidence of lytic lesions within the skeleton or soft tissue Plasmacytoma.  05/07/2019 DXA scan revealed osteopenia, T-score of -1.2.  2) Chronic leukopenia - ? Related to SMM vs other nutritional deficiencies (h/o gastric bypass surgery put her at risk for nutritional deficiencies)  B12 levels WNL  Ferritin adequate  ?additional factor -recent NSAID use.  ?element of benign ethnic neutropenia.  Has not had any issues with frequent infection.   ANC post-operative normalized today at 1700  4) Neuropathy/radiculopathy -Continue follow up with orthopedics  5) Superficial Venous Thrombosis of the left small saphenous vein  06/24/2019 US venous left : Right: There is no evidence of deep vein thrombosis in the lower extremity. No cystic structure found in the popliteal fossa. Left: Findings consistent with acute superficial vein thrombosis involving the left small saphenous vein. There is no evidence of deep vein thrombosis in the lower extremity. However, portions of this examination were limited- see technologist comments above. No cystic structure found in the popliteal fossa  PLAN: -Discussed pt labwork today, 04/06/20; WBC & Hgb slightly low, PLT are nml, Calcium is low, other blood chemistries are steady -Discussed 04/06/2020 MMP & K/L light chains are in progress -The pt has no prohibitive toxicities from continuing Carflizomib maintenance q2weeks at this time.  -Will increase Carfilzomib dosage to 56 mg/m^2 for today's treatment. May consider further dose increases if tolerated well.  -Will recheck Vitamin D levels with next labs  -Continue weekly Ergocalciferol  -Continue Vitamin H70 & Folic acid daily -Continue Zometa q106month -[facility] -Will see back in 1 month with labs   FOLLOW UP: Please schedule next 2 cycles (4 doses) Of carfilzomib  Portflush and labs with each  appointment MD visit in 4 weeks Plz schedule zometa q12weeks - 3 doses    The total time spent in the appt was 30 minutes and more than 50% was on counseling and direct patient cares., ordering and mx of carfilzomib  All of the patient's questions were answered with apparent satisfaction. The patient knows to call the clinic with any problems, questions or concerns.    GSullivan LoneMD MMiddleburgAAHIVMS SHoly Spirit HospitalCBucks County Gi [facility] LLCHematology/Oncology Physician CSt [facility] (Office):       [phone](Work cell):  [phone](Fax):           [phone] I, JYevette Edwards am acting as a scribe for Dr. GSullivan Lone   .I have reviewed the above documentation for accuracy and completeness, and I agree with the above. .Brunetta GeneraMD

## 2020-04-06 ENCOUNTER — Inpatient Hospital Stay: Payer: Medicare Other

## 2020-04-06 ENCOUNTER — Inpatient Hospital Stay (HOSPITAL_BASED_OUTPATIENT_CLINIC_OR_DEPARTMENT_OTHER): Payer: Medicare Other | Admitting: Hematology

## 2020-04-06 ENCOUNTER — Other Ambulatory Visit: Payer: Medicare Other

## 2020-04-06 ENCOUNTER — Other Ambulatory Visit: Payer: Self-pay

## 2020-04-06 ENCOUNTER — Ambulatory Visit: Payer: Medicare Other

## 2020-04-06 VITALS — BP 146/85 | HR 71 | Temp 97.3°F | Resp 20 | Ht 63.0 in | Wt 187.5 lb

## 2020-04-06 DIAGNOSIS — Z5112 Encounter for antineoplastic immunotherapy: Secondary | ICD-10-CM | POA: Diagnosis not present

## 2020-04-06 DIAGNOSIS — D649 Anemia, unspecified: Secondary | ICD-10-CM

## 2020-04-06 DIAGNOSIS — C9000 Multiple myeloma not having achieved remission: Secondary | ICD-10-CM | POA: Diagnosis not present

## 2020-04-06 DIAGNOSIS — Z5111 Encounter for antineoplastic chemotherapy: Secondary | ICD-10-CM

## 2020-04-06 DIAGNOSIS — D702 Other drug-induced agranulocytosis: Secondary | ICD-10-CM

## 2020-04-06 DIAGNOSIS — Z95828 Presence of other vascular implants and grafts: Secondary | ICD-10-CM

## 2020-04-06 DIAGNOSIS — Z7189 Other specified counseling: Secondary | ICD-10-CM

## 2020-04-06 LAB — CMP (CANCER CENTER ONLY)
ALT: 23 U/L (ref 0–44)
AST: 18 U/L (ref 15–41)
Albumin: 3.8 g/dL (ref 3.5–5.0)
Alkaline Phosphatase: 115 U/L (ref 38–126)
Anion gap: 7 (ref 5–15)
BUN: 12 mg/dL (ref 8–23)
CO2: 21 mmol/L — ABNORMAL LOW (ref 22–32)
Calcium: 8.5 mg/dL — ABNORMAL LOW (ref 8.9–10.3)
Chloride: 111 mmol/L (ref 98–111)
Creatinine: 0.79 mg/dL (ref 0.44–1.00)
GFR, Est AFR Am: >60 mL/min (ref >60)
GFR, Estimated: >60 mL/min (ref >60)
Glucose, Bld: 82 mg/dL (ref 70–99)
Potassium: 3.9 mmol/L (ref 3.5–5.1)
Sodium: 139 mmol/L (ref 135–145)
Total Bilirubin: 1.2 mg/dL (ref 0.3–1.2)
Total Protein: 6.2 g/dL — ABNORMAL LOW (ref 6.5–8.1)

## 2020-04-06 LAB — CBC WITH DIFFERENTIAL/PLATELET
Abs Immature Granulocytes: 0 10*3/uL (ref 0.00–0.07)
Basophils Absolute: 0 10*3/uL (ref 0.0–0.1)
Basophils Relative: 1 %
Eosinophils Absolute: 0.1 10*3/uL (ref 0.0–0.5)
Eosinophils Relative: 3 %
HCT: 32 % — ABNORMAL LOW (ref 36.0–46.0)
Hemoglobin: 10.4 g/dL — ABNORMAL LOW (ref 12.0–15.0)
Immature Granulocytes: 0 %
Lymphocytes Relative: 53 %
Lymphs Abs: 2 10*3/uL (ref 0.7–4.0)
MCH: 30.4 pg (ref 26.0–34.0)
MCHC: 32.5 g/dL (ref 30.0–36.0)
MCV: 93.6 fL (ref 80.0–100.0)
Monocytes Absolute: 0.4 10*3/uL (ref 0.1–1.0)
Monocytes Relative: 9 %
Neutro Abs: 1.3 10*3/uL — ABNORMAL LOW (ref 1.7–7.7)
Neutrophils Relative %: 34 %
Platelets: 166 10*3/uL (ref 150–400)
RBC: 3.42 MIL/uL — ABNORMAL LOW (ref 3.87–5.11)
RDW: 13.2 % (ref 11.5–15.5)
WBC: 3.8 10*3/uL — ABNORMAL LOW (ref 4.0–10.5)
nRBC: 0 % (ref 0.0–0.2)

## 2020-04-06 MED ORDER — ACETAMINOPHEN 500 MG PO TABS
1000.0000 mg | ORAL_TABLET | Freq: Once | ORAL | Status: AC
  Administered 2020-04-06: 1000 mg via ORAL

## 2020-04-06 MED ORDER — SODIUM CHLORIDE 0.9 % IV SOLN
Freq: Once | INTRAVENOUS | Status: AC
  Filled 2020-04-06: qty 250

## 2020-04-06 MED ORDER — ACETAMINOPHEN 500 MG PO TABS
ORAL_TABLET | ORAL | Status: AC
  Filled 2020-04-06: qty 2

## 2020-04-06 MED ORDER — DEXTROSE 5 % IV SOLN
56.0000 mg/m2 | Freq: Once | INTRAVENOUS | Status: AC
  Administered 2020-04-06: 120 mg via INTRAVENOUS
  Filled 2020-04-06: qty 60

## 2020-04-06 MED ORDER — SODIUM CHLORIDE 0.9 % IV SOLN
20.0000 mg | Freq: Once | INTRAVENOUS | Status: AC
  Administered 2020-04-06: 20 mg via INTRAVENOUS
  Filled 2020-04-06: qty 20

## 2020-04-06 MED ORDER — SODIUM CHLORIDE 0.9% FLUSH
10.0000 mL | INTRAVENOUS | Status: DC | PRN
  Administered 2020-04-06: 10 mL
  Filled 2020-04-06: qty 10

## 2020-04-06 MED ORDER — HEPARIN SOD (PORK) LOCK FLUSH 100 UNIT/ML IV SOLN
500.0000 [IU] | Freq: Once | INTRAVENOUS | Status: AC | PRN
  Administered 2020-04-06: 500 [IU]
  Filled 2020-04-06: qty 5

## 2020-04-06 MED ORDER — PROCHLORPERAZINE MALEATE 10 MG PO TABS
ORAL_TABLET | ORAL | Status: AC
  Filled 2020-04-06: qty 1

## 2020-04-06 MED ORDER — PROCHLORPERAZINE MALEATE 10 MG PO TABS
10.0000 mg | ORAL_TABLET | Freq: Once | ORAL | Status: AC
  Administered 2020-04-06: 10 mg via ORAL

## 2020-04-06 MED ORDER — SODIUM CHLORIDE 0.9% FLUSH
10.0000 mL | Freq: Once | INTRAVENOUS | Status: AC
  Administered 2020-04-06: 10 mL
  Filled 2020-04-06: qty 10

## 2020-04-06 NOTE — Progress Notes (Signed)
Ok to treat with ANC today per MD Kale 

## 2020-04-06 NOTE — Patient Instructions (Signed)
[facility] Discharge Instructions for Patients Receiving Chemotherapy  Today you received the following chemotherapy agents:  Kyprolis  To help prevent nausea and vomiting after your treatment, we encourage you to take your nausea medication as directed.   If you develop nausea and vomiting that is not controlled by your nausea medication, call the clinic.   BELOW ARE SYMPTOMS THAT SHOULD BE REPORTED IMMEDIATELY:  *FEVER GREATER THAN 100.5 F  *CHILLS WITH OR WITHOUT FEVER  NAUSEA AND VOMITING THAT IS NOT CONTROLLED WITH YOUR NAUSEA MEDICATION  *UNUSUAL SHORTNESS OF BREATH  *UNUSUAL BRUISING OR BLEEDING  TENDERNESS IN MOUTH AND THROAT WITH OR WITHOUT PRESENCE OF ULCERS  *URINARY PROBLEMS  *BOWEL PROBLEMS  UNUSUAL RASH Items with * indicate a potential emergency and should be followed up as soon as possible.  Feel free to call the clinic should you have any questions or concerns. The clinic phone number is [phone].  Please show the CHEMO ALERT CARD at check-in to the Emergency Department and triage nurse.   

## 2020-04-08 LAB — KAPPA/LAMBDA LIGHT CHAINS
Kappa free light chain: 8.4 mg/L (ref 3.3–19.4)
Kappa, lambda light chain ratio: 1 (ref 0.26–1.65)
Lambda free light chains: 8.4 mg/L (ref 5.7–26.3)

## 2020-04-11 LAB — MULTIPLE MYELOMA PANEL, SERUM
Albumin SerPl Elph-Mcnc: 3.7 g/dL (ref 2.9–4.4)
Albumin/Glob SerPl: 1.7 (ref 0.7–1.7)
Alpha 1: 0.2 g/dL (ref 0.0–0.4)
Alpha2 Glob SerPl Elph-Mcnc: 0.5 g/dL (ref 0.4–1.0)
B-Globulin SerPl Elph-Mcnc: 0.9 g/dL (ref 0.7–1.3)
Gamma Glob SerPl Elph-Mcnc: 0.6 g/dL (ref 0.4–1.8)
Globulin, Total: 2.2 g/dL (ref 2.2–3.9)
IgA: 13 mg/dL — ABNORMAL LOW (ref 87–352)
IgG (Immunoglobin G), Serum: 655 mg/dL (ref 586–1602)
IgM (Immunoglobulin M), Srm: 22 mg/dL — ABNORMAL LOW (ref 26–217)
Total Protein ELP: 5.9 g/dL — ABNORMAL LOW (ref 6.0–8.5)

## 2020-04-14 ENCOUNTER — Telehealth: Payer: Self-pay | Admitting: Hematology

## 2020-04-14 NOTE — Telephone Encounter (Signed)
Scheduled per los, patient has been called and notified of upcoming appointments. 

## 2020-04-20 ENCOUNTER — Other Ambulatory Visit: Payer: Self-pay | Admitting: Hematology

## 2020-04-20 ENCOUNTER — Inpatient Hospital Stay: Payer: Medicare Other

## 2020-04-20 ENCOUNTER — Other Ambulatory Visit: Payer: Self-pay

## 2020-04-20 ENCOUNTER — Inpatient Hospital Stay: Payer: Medicare Other | Attending: Hematology

## 2020-04-20 VITALS — BP 112/74 | HR 75 | Temp 98.8°F | Resp 18

## 2020-04-20 DIAGNOSIS — Z5111 Encounter for antineoplastic chemotherapy: Secondary | ICD-10-CM

## 2020-04-20 DIAGNOSIS — D709 Neutropenia, unspecified: Secondary | ICD-10-CM | POA: Diagnosis not present

## 2020-04-20 DIAGNOSIS — M8580 Other specified disorders of bone density and structure, unspecified site: Secondary | ICD-10-CM | POA: Insufficient documentation

## 2020-04-20 DIAGNOSIS — D649 Anemia, unspecified: Secondary | ICD-10-CM | POA: Insufficient documentation

## 2020-04-20 DIAGNOSIS — C9000 Multiple myeloma not having achieved remission: Secondary | ICD-10-CM | POA: Diagnosis not present

## 2020-04-20 DIAGNOSIS — Z5112 Encounter for antineoplastic immunotherapy: Secondary | ICD-10-CM | POA: Diagnosis not present

## 2020-04-20 DIAGNOSIS — Z7189 Other specified counseling: Secondary | ICD-10-CM

## 2020-04-20 DIAGNOSIS — D702 Other drug-induced agranulocytosis: Secondary | ICD-10-CM

## 2020-04-20 DIAGNOSIS — Z86718 Personal history of other venous thrombosis and embolism: Secondary | ICD-10-CM | POA: Diagnosis not present

## 2020-04-20 DIAGNOSIS — Z95828 Presence of other vascular implants and grafts: Secondary | ICD-10-CM

## 2020-04-20 LAB — CBC WITH DIFFERENTIAL/PLATELET
Abs Immature Granulocytes: 0 10*3/uL (ref 0.00–0.07)
Basophils Absolute: 0 10*3/uL (ref 0.0–0.1)
Basophils Relative: 1 %
Eosinophils Absolute: 0.1 10*3/uL (ref 0.0–0.5)
Eosinophils Relative: 2 %
HCT: 32.2 % — ABNORMAL LOW (ref 36.0–46.0)
Hemoglobin: 10.4 g/dL — ABNORMAL LOW (ref 12.0–15.0)
Immature Granulocytes: 0 %
Lymphocytes Relative: 50 %
Lymphs Abs: 1.4 10*3/uL (ref 0.7–4.0)
MCH: 30.3 pg (ref 26.0–34.0)
MCHC: 32.3 g/dL (ref 30.0–36.0)
MCV: 93.9 fL (ref 80.0–100.0)
Monocytes Absolute: 0.3 10*3/uL (ref 0.1–1.0)
Monocytes Relative: 10 %
Neutro Abs: 1.1 10*3/uL — ABNORMAL LOW (ref 1.7–7.7)
Neutrophils Relative %: 37 %
Platelets: 180 10*3/uL (ref 150–400)
RBC: 3.43 MIL/uL — ABNORMAL LOW (ref 3.87–5.11)
RDW: 13.8 % (ref 11.5–15.5)
WBC: 2.9 10*3/uL — ABNORMAL LOW (ref 4.0–10.5)
nRBC: 0 % (ref 0.0–0.2)

## 2020-04-20 LAB — CMP (CANCER CENTER ONLY)
ALT: 21 U/L (ref 0–44)
AST: 20 U/L (ref 15–41)
Albumin: 3.8 g/dL (ref 3.5–5.0)
Alkaline Phosphatase: 94 U/L (ref 38–126)
Anion gap: 10 (ref 5–15)
BUN: 14 mg/dL (ref 8–23)
CO2: 21 mmol/L — ABNORMAL LOW (ref 22–32)
Calcium: 9.1 mg/dL (ref 8.9–10.3)
Chloride: 110 mmol/L (ref 98–111)
Creatinine: 0.97 mg/dL (ref 0.44–1.00)
GFR, Est AFR Am: >60 mL/min (ref >60)
GFR, Estimated: >60 mL/min (ref >60)
Glucose, Bld: 82 mg/dL (ref 70–99)
Potassium: 3.9 mmol/L (ref 3.5–5.1)
Sodium: 141 mmol/L (ref 135–145)
Total Bilirubin: 0.9 mg/dL (ref 0.3–1.2)
Total Protein: 6.4 g/dL — ABNORMAL LOW (ref 6.5–8.1)

## 2020-04-20 MED ORDER — ACETAMINOPHEN 500 MG PO TABS
1000.0000 mg | ORAL_TABLET | Freq: Once | ORAL | Status: AC
  Administered 2020-04-20: 1000 mg via ORAL

## 2020-04-20 MED ORDER — SODIUM CHLORIDE 0.9% FLUSH
10.0000 mL | INTRAVENOUS | Status: DC | PRN
  Administered 2020-04-20: 10 mL
  Filled 2020-04-20: qty 10

## 2020-04-20 MED ORDER — ACETAMINOPHEN 500 MG PO TABS
ORAL_TABLET | ORAL | Status: AC
  Filled 2020-04-20: qty 2

## 2020-04-20 MED ORDER — SODIUM CHLORIDE 0.9 % IV SOLN
Freq: Once | INTRAVENOUS | Status: AC
  Filled 2020-04-20: qty 250

## 2020-04-20 MED ORDER — DEXTROSE 5 % IV SOLN
56.0000 mg/m2 | Freq: Once | INTRAVENOUS | Status: AC
  Administered 2020-04-20: 120 mg via INTRAVENOUS
  Filled 2020-04-20: qty 60

## 2020-04-20 MED ORDER — PROCHLORPERAZINE MALEATE 10 MG PO TABS
ORAL_TABLET | ORAL | Status: AC
  Filled 2020-04-20: qty 1

## 2020-04-20 MED ORDER — SODIUM CHLORIDE 0.9 % IV SOLN
20.0000 mg | Freq: Once | INTRAVENOUS | Status: AC
  Administered 2020-04-20: 20 mg via INTRAVENOUS
  Filled 2020-04-20: qty 20

## 2020-04-20 MED ORDER — PROCHLORPERAZINE MALEATE 10 MG PO TABS
10.0000 mg | ORAL_TABLET | Freq: Once | ORAL | Status: AC
  Administered 2020-04-20: 10 mg via ORAL

## 2020-04-20 MED ORDER — HEPARIN SOD (PORK) LOCK FLUSH 100 UNIT/ML IV SOLN
500.0000 [IU] | Freq: Once | INTRAVENOUS | Status: AC | PRN
  Administered 2020-04-20: 500 [IU]
  Filled 2020-04-20: qty 5

## 2020-04-20 MED ORDER — SODIUM CHLORIDE 0.9% FLUSH
10.0000 mL | Freq: Once | INTRAVENOUS | Status: AC
  Administered 2020-04-20: 10 mL
  Filled 2020-04-20: qty 10

## 2020-04-20 NOTE — Progress Notes (Signed)
Neut 1.1today. okay to treat, per Dr. Irene Limbo.

## 2020-04-20 NOTE — Patient Instructions (Signed)

## 2020-04-20 NOTE — Patient Instructions (Signed)
[facility] Discharge Instructions for Patients Receiving Chemotherapy  Today you received the following chemotherapy agents:  Kyprolis  To help prevent nausea and vomiting after your treatment, we encourage you to take your nausea medication as directed.   If you develop nausea and vomiting that is not controlled by your nausea medication, call the clinic.   BELOW ARE SYMPTOMS THAT SHOULD BE REPORTED IMMEDIATELY:  *FEVER GREATER THAN 100.5 F  *CHILLS WITH OR WITHOUT FEVER  NAUSEA AND VOMITING THAT IS NOT CONTROLLED WITH YOUR NAUSEA MEDICATION  *UNUSUAL SHORTNESS OF BREATH  *UNUSUAL BRUISING OR BLEEDING  TENDERNESS IN MOUTH AND THROAT WITH OR WITHOUT PRESENCE OF ULCERS  *URINARY PROBLEMS  *BOWEL PROBLEMS  UNUSUAL RASH Items with * indicate a potential emergency and should be followed up as soon as possible.  Feel free to call the clinic should you have any questions or concerns. The clinic phone number is [phone].  Please show the CHEMO ALERT CARD at check-in to the Emergency Department and triage nurse.   

## 2020-05-03 NOTE — Progress Notes (Signed)
HEMATOLOGY/ONCOLOGY CLINIC NOTE  Date of Service: 05/04/20     PCP: Marzetta Board MD  CHIEF COMPLAINTS/PURPOSE OF CONSULTATION:  Continued f/u for mx of myeloma   HISTORY OF PRESENTING ILLNESS:   Madison Parker is a wonderful 69 y.o. 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  Patient has a history of smoldering multiple myeloma which she reports she was diagnosed with in 2011 when she was evaluated by a hematologist in [place]. She reports that she presented with low blood counts (low WBC)   and after significant workup she had a bone marrow examination based on which she was told that she has smoldering multiple myeloma. Patient notes that she had a bone survey at that time which was negative. She was monitored there closely and then moved to [facility] in 2015 to stay with her son whose wife was being treated for breast cancer. Patient was following with Dr. Delight Hoh in [place]. She reports not having had any treatment for multiple myeloma.  Her last labs from about a year ago showed a SPEP with no OBSERVED M spike . Serum free light chains showed an elevation of kappa Free light chain 310 and lambda free light chain of 12.7 with an abnormal Kappa/ Lambda ratio of 24.38 ( up from 19 previously). Random urine showed an M protein complement of 44%.  She lives in [place] and requested transfer of care to [place]. She was offered follow-up but [facility] in [place]  But prefers to  follow [place] in [place]. Patient reports her energy levels been stable. She has chronic back pain which has improved since the patient had her spinal surgery in April 2017 (L4-L5 interbody fusion). Patient notes she has had some chronic left hip pain which is somewhat more bothersome with the navy on the lateral aspect of her upper thighs that's painful. She also notes some right hip pain.  Over the last few weeks he notes some upper neck pain especially when bending her neck backwards and tingling numbness in her right upper extremity.  Has been working on losing some weight voluntarily.  No acute other new symptoms.  AUTOLOGOUS STEM CELL TRANSPLANT:  Disease status at time of transplant: sCR MRD positive  ASBMT risk stratification: low risk, ECOG/KPS: 0 / 90%, HSCT-CI score: 0  Started mobilization on 09/03/19 using GCS-F and Plerixafor. She collected stem cells on 09/07/19. The pre-processing total was 7.18x10(6) CD34+ cells/kg. The post-processing total was 1.11x10(7) CD34+ cells/kg frozen in 3 bags.  Preparative regimen with Melphalan 200 mg/m2 on 09/14/19 (outpatient).  Infusion of stem cells - 7.4 x 10(6) on 09/15/19 (outpatient)  Admitted Day +8 for neutropenic fever, uncontrolled n/v, diarrhea  Treated with short course of prednisone for peri-engraftment syndrome  TREATMENT:  Started induction therapy with carfilozomib, lenlaidomide, and dexamethasone 28 day cycle on 04/27/2019. She had a great response to initial cycle. She developed a DVT on 06/24/19 and was started on apixaban as an anticoagulant. She had normalization of her light chains by the start of C4 on 07/28/19.  She was referred to the myeloma clinic at [facility] for BMT consult and seen on 08/10/19. Serology from her visit showed no M-spike by SPEP, a free light chain ratio of 1.07 (kappa 11.42, lambda 10.65), and an abnormal beta-2-micro of 3.02. She was started on pre-transplant evaluation while undergoing C5 of therapy.  Treatment discontinued in 07/2019 in  preparation for autologous stem cell transplant.   INTERVAL HISTORY:   Madison Parker returns today for f/u of her multiple myeloma. She is s/p HDT -Auto HSCT on 09/15/2019. Reviewed post transplant course with her based on [facility] records. She is here for maintenance Carfilzomib. The patient's last visit with [place] was on 04/06/2020. The pt reports that  she is doing well overall.  The pt reports that she had increased fatigue for two days after the last treatment. She has been eating well and hydrating well.   Lab results today (05/04/20) of CBC w/diff and CMP is as follows: all values are WNL except for WBC at 3.0K, RBC at 3.40, Hgb at 10.4, HCT at 31.9, Neutro Abs at 1.1K, Chloride at 112, CO2 at 21, Total Protein at 6.2.  On review of systems, pt denies fever, rashes, constipation, diarrhea, nausea, bone pain, pain at port site, back pain, abdominal pain, loss of appetite and any other symptoms.   MEDICAL HISTORY:  Past Medical History:  Diagnosis Date  . Headache   . Low back pain   . SBO (small bowel obstruction) ([facility]) 2010  . Smoldering multiple myeloma ([facility])   Previous history of hypothyroidism- not currently medications Anxiety Obesity .Body mass index is 33.16 kg/m. Vitamin D deficiency Smoldering multiple myeloma diagnosed in 2011 Lumbosacral radiculopathy Left hip pain  SURGICAL HISTORY: Past Surgical History:  Procedure Laterality Date  . ABDOMINAL HYSTERECTOMY     complete  . BACK SURGERY     lower at [facility]  . colonscopy  2014  . GASTRIC BYPASS  yrs ago  . HERNIA REPAIR    . IR IMAGING GUIDED PORT INSERTION  04/20/2019  . KNEE SURGERY  06/04/2009   both knees replaced   . TONSILLECTOMY  age 74  . TOTAL HIP ARTHROPLASTY Left 08/16/2017   Procedure: LEFT TOTAL HIP ARTHROPLASTY ANTERIOR APPROACH;  Surgeon: Dorna Leitz, MD;  Location: [facility];  Service: Orthopedics;  Laterality: Left;  Spinal surgery April 2017  SOCIAL HISTORY: Social History   Socioeconomic History  . Marital status: Married    Spouse name: Not on file  . Number of children: Not on file  . Years of education: Not on file  . Highest education level: Not on file  Occupational History  . Not on file  Tobacco Use  . Smoking status: Former Smoker    Packs/day: 0.50    Years: 10.00    Pack years: 5.00    Types: Cigarettes  . Smokeless  tobacco: Never Used  . Tobacco comment: quit 1977  Vaping Use  . Vaping Use: Never used  Substance and Sexual Activity  . Alcohol use: Yes    Comment: occ  . Drug use: No  . Sexual activity: Not on file  Other Topics Concern  . Not on file  Social History Narrative  . Not on file   Social Determinants of Health   Financial Resource Strain:   . Difficulty of Paying Living Expenses:   Food Insecurity:   . Worried About Charity fundraiser in the Last Year:   . Arboriculturist in the Last Year:   Transportation Needs:   . Film/video editor (Medical):   Marland Kitchen Lack of Transportation (Non-Medical):   Physical Activity:   . Days of Exercise per Week:   . Minutes of Exercise per Session:   Stress:   . Feeling of Stress :   Social Connections:   . Frequency of Communication with Friends and  Family:   . Frequency of Social Gatherings with Friends and Family:   . Attends Religious Services:   . Active Member of Clubs or Organizations:   . Attends Archivist Meetings:   Marland Kitchen Marital Status:   Intimate Partner Violence:   . Fear of Current or Ex-Partner:   . Emotionally Abused:   Marland Kitchen Physically Abused:   . Sexually Abused:   Occasional alcohol use Former smoker and smoked 1 pack per week for about 9 years has since quit.  FAMILY HISTORY: No family history on file.  ALLERGIES:  is allergic to codeine.  MEDICATIONS:  Current Outpatient Medications  Medication Sig Dispense Refill  . acyclovir (ZOVIRAX) 400 MG tablet Take 1 tablet (400 mg total) by mouth 2 (two) times daily. 180 tablet 0  . Calcium Carbonate (CALCIUM 600 PO) Take 1 tablet by mouth daily.    . ergocalciferol (VITAMIN D2) 1.25 MG (50000 UT) capsule Take 50,000 Units by mouth once a week.    Marland Kitchen FOLIC ACID PO Take 1 tablet by mouth.    . lidocaine-prilocaine (EMLA) cream Apply 1 application topically as needed. 30 g 2  . MULTIPLE VITAMIN PO Take 1 tablet by mouth daily.    . ondansetron (ZOFRAN) 8 MG tablet  Take 1 tablet (8 mg total) by mouth 2 (two) times daily as needed (Nausea or vomiting). 30 tablet 1  . prochlorperazine (COMPAZINE) 10 MG tablet Take 1 tablet (10 mg total) by mouth every 6 (six) hours as needed for nausea or vomiting. 30 tablet 0  . TURMERIC-GINGER PO Take 1 each by mouth daily. Takes 1 per day    . vitamin B-12 (CYANOCOBALAMIN) 1000 MCG tablet Take 5,000 mcg by mouth daily.      No current facility-administered medications for this visit.   Facility-Administered Medications Ordered in Other Visits  Medication Dose Route Frequency Provider Last Rate Last Admin  . sodium chloride flush (NS) 0.9 % injection 10 mL  10 mL Intracatheter PRN Brunetta Genera, MD   10 mL at 05/11/19 1437    REVIEW OF SYSTEMS:  A 10+ POINT REVIEW OF SYSTEMS WAS OBTAINED including neurology, dermatology, psychiatry, cardiac, respiratory, lymph, extremities, GI, GU, Musculoskeletal, constitutional, breasts, reproductive, HEENT.  All pertinent positives are noted in the HPI.  All others are negative.   PHYSICAL EXAMINATION: ECOG FS:1 - Symptomatic but completely ambulatory  Vitals:   05/04/20 0849  BP: (!) 144/75  Pulse: 81  Resp: 18  Temp: 98.1 F (36.7 C)  SpO2: 100%   Wt Readings from Last 3 Encounters:  05/04/20 187 lb 3.2 oz (84.9 kg)  04/06/20 187 lb 8 oz (85 kg)  03/09/20 190 lb 4.8 oz (86.3 kg)   Body mass index is 33.16 kg/m.    GENERAL:alert, in no acute distress and comfortable SKIN: no acute rashes, no significant lesions EYES: conjunctiva are pink and non-injected, sclera anicteric OROPHARYNX: MMM, no exudates, no oropharyngeal erythema or ulceration NECK: supple, no JVD LYMPH:  no palpable lymphadenopathy in the cervical, axillary or inguinal regions LUNGS: clear to auscultation b/l with normal respiratory effort HEART: regular rate & rhythm ABDOMEN:  normoactive bowel sounds , non tender, not distended. No palpable hepatosplenomegaly.  Extremity: no pedal  edema PSYCH: alert & oriented x 3 with fluent speech NEURO: no focal motor/sensory deficits  LABORATORY DATA:  I have reviewed the data as listed  . CBC Latest Ref Rng & Units 05/04/2020 04/20/2020 04/06/2020  WBC 4.0 - 10.5 K/uL 3.0(L) 2.9(L)  3.8(L)  Hemoglobin 12.0 - 15.0 g/dL 10.4(L) 10.4(L) 10.4(L)  Hematocrit 36 - 46 % 31.9(L) 32.2(L) 32.0(L)  Platelets 150 - 400 K/uL 179 180 166  ANC 1300 . CBC    Component Value Date/Time   WBC 3.0 (L) 05/04/2020 0831   RBC 3.40 (L) 05/04/2020 0831   HGB 10.4 (L) 05/04/2020 0831   HGB 11.3 (L) 11/12/2019 0836   HGB 11.3 (L) 09/30/2017 1145   HCT 31.9 (L) 05/04/2020 0831   HCT 35.1 09/30/2017 1145   PLT 179 05/04/2020 0831   PLT 149 (L) 11/12/2019 0836   PLT 172 09/30/2017 1145   MCV 93.8 05/04/2020 0831   MCV 97.5 09/30/2017 1145   MCH 30.6 05/04/2020 0831   MCHC 32.6 05/04/2020 0831   RDW 14.1 05/04/2020 0831   RDW 14.0 09/30/2017 1145   LYMPHSABS 1.6 05/04/2020 0831   LYMPHSABS 1.3 09/30/2017 1145   MONOABS 0.3 05/04/2020 0831   MONOABS 0.2 09/30/2017 1145   EOSABS 0.1 05/04/2020 0831   EOSABS 0.0 09/30/2017 1145   EOSABS 0.1 06/03/2014 1003   BASOSABS 0.0 05/04/2020 0831   BASOSABS 0.0 09/30/2017 1145     . CMP Latest Ref Rng & Units 04/20/2020 04/06/2020 03/23/2020  Glucose 70 - 99 mg/dL 82 82 77  BUN 8 - 23 mg/dL 14 12 11   Creatinine 0.44 - 1.00 mg/dL 0.97 0.79 0.80  Sodium 135 - 145 mmol/L 141 139 142  Potassium 3.5 - 5.1 mmol/L 3.9 3.9 4.1  Chloride 98 - 111 mmol/L 110 111 111  CO2 22 - 32 mmol/L 21(L) 21(L) 19(L)  Calcium 8.9 - 10.3 mg/dL 9.1 8.5(L) 8.8(L)  Total Protein 6.5 - 8.1 g/dL 6.4(L) 6.2(L) 6.7  Total Bilirubin 0.3 - 1.2 mg/dL 0.9 1.2 0.7  Alkaline Phos 38 - 126 U/L 94 115 133(H)  AST 15 - 41 U/L 20 18 22   ALT 0 - 44 U/L 21 23 23          Component     Latest Ref Rng & Units 11/08/2016  LDH     125 - 245 U/L 220  Beta 2     0.6 - 2.4 mg/L 2.5 (H)   Component     Latest Ref Rng & Units 07/30/2017   Ferritin     9 - 269 ng/ml 81  Vitamin B12     232 - 1,245 pg/mL 594   Magnesium: Component Magnesium  Latest Ref Rng & Units 1.7 - 2.4 mg/dL  10/02/2019 1.7  10/21/2019 1.7  11/12/2019 1.7    03/11/19 BM Bx:   03/11/19 Cytogenetics:            RADIOGRAPHIC STUDIES: I have personally reviewed the radiological images as listed and agreed with the findings in the report. No results found.   Bone survey 11/08/2016 IMPRESSION: 1. Small lytic lesion in the right scapula. 2. Possible small lytic lesion in the left scapula. 3. Postoperative changes. 4. Significant degenerative changes in both hips, left greater than right. 5. No evidence for acute fracture   Electronically Signed   By: Nolon Nations M.D.   On: 11/08/2016 16:19   ASSESSMENT & PLAN:   69 y.o. very pleasant lady with history of   1) Multiple myeloma (concern for small lytic lesions in left and right scapulae) - (appears light chain producing) and Progressive anemia  This was apparently diagnosed as smoldering myeloma in 2011 by her hematologist in [place].  No renal failure/hypercalcemia.  Bone survey with concern for  possible small lytic lesions in B/L scapulae. PET/CT scan on 03/12/2017 showed no concerning bone lesions.  Initial Bm Bx with 17% kappa restricted plasma cells consistent with plasma cell neoplasm.  No treatment so far.  03/11/19 BM Bx revealed involvement by 50% plasma cells; genetics -high risk t(14;16), previous genetics from April 2018 BM Bx were standard risk  03/16/19 MRI Lumbar reveals several explanations for her lower back pain including L2-L3 stenosis, but reassuringly, there was not evidence of involvement by multiple myeloma  04/01/19 PET/CT which revealed no FDG evidence of active multiple myeloma within the skeleton. No evidence of lytic lesions within the skeleton or soft tissue Plasmacytoma.  05/07/2019 DXA scan revealed osteopenia, T-score of -1.2.  2)  Chronic leukopenia - ? Related to SMM vs other nutritional deficiencies (h/o gastric bypass surgery put her at risk for nutritional deficiencies)  B12 levels WNL  Ferritin adequate  ?additional factor -recent NSAID use.  ?element of benign ethnic neutropenia.  Has not had any issues with frequent infection.   ANC post-operative normalized today at 1700  4) Neuropathy/radiculopathy -Continue follow up with orthopedics  5) Superficial Venous Thrombosis of the left small saphenous vein  06/24/2019 US venous left : Right: There is no evidence of deep vein thrombosis in the lower extremity. No cystic structure found in the popliteal fossa. Left: Findings consistent with acute superficial vein thrombosis involving the left small saphenous vein. There is no evidence of deep vein thrombosis in the lower extremity. However, portions of this examination were limited- see technologist comments above. No cystic structure found in the popliteal fossa  PLAN: -Discussed pt labwork today, 05/04/20; mild neutropenia, stable anemia, PLT are nml, blood chemistries are steady  -Discussed 04/06/2020 MMP shows M Protein is still "Not Observed"; K/L light chains are all WNL -The pt has no prohibitive toxicities from continuing Carflizomib maintenance q2weeks at this time. -Will hold dose escalation due to concern for worsening neutropenia. Will continue Carflizomib at 56 mg/m^2.  -Pt has chronic neutropenia - present prior to the start of treatment  -Continue weekly Ergocalciferol  -Continue Vitamin T01 & Folic acid daily -Continue Zometa q43month -[facility] -Will see back in 6 weeks with labs   FOLLOW UP: Please schedule next 2 cycles (4 doses) Of carfilzomib  Portflush and labs with each appointment MD visit in 6 weeks Plz schedule zometa q12weeks - 3 doses    The total time spent in the appt was 30 minutes and more than 50% was  on counseling and direct patient cares, ordering and mx of Carfilzomib treatment  All of the patient's questions were answered with apparent satisfaction. The patient knows to call the clinic with any problems, questions or concerns.    GSullivan LoneMD MCallahanAAHIVMS SPagosa Mountain HospitalCEncompass Health Rehabilitation Hospital Of KingsportHematology/Oncology Physician [facility] (Office):       [phone](Work cell):  [phone](Fax):           [phone] I, JYevette Edwards am acting as a scribe for Dr. GSullivan Lone   .I have reviewed the above documentation for accuracy and completeness, and I agree with the above. .Brunetta GeneraMD

## 2020-05-04 ENCOUNTER — Inpatient Hospital Stay: Payer: Medicare Other

## 2020-05-04 ENCOUNTER — Inpatient Hospital Stay (HOSPITAL_BASED_OUTPATIENT_CLINIC_OR_DEPARTMENT_OTHER): Payer: Medicare Other | Admitting: Hematology

## 2020-05-04 ENCOUNTER — Other Ambulatory Visit: Payer: Self-pay

## 2020-05-04 VITALS — BP 144/75 | HR 81 | Temp 98.1°F | Resp 18 | Ht 63.0 in | Wt 187.2 lb

## 2020-05-04 DIAGNOSIS — C9000 Multiple myeloma not having achieved remission: Secondary | ICD-10-CM

## 2020-05-04 DIAGNOSIS — Z5112 Encounter for antineoplastic immunotherapy: Secondary | ICD-10-CM | POA: Diagnosis not present

## 2020-05-04 DIAGNOSIS — D649 Anemia, unspecified: Secondary | ICD-10-CM | POA: Diagnosis not present

## 2020-05-04 DIAGNOSIS — M8580 Other specified disorders of bone density and structure, unspecified site: Secondary | ICD-10-CM | POA: Diagnosis not present

## 2020-05-04 DIAGNOSIS — D709 Neutropenia, unspecified: Secondary | ICD-10-CM | POA: Diagnosis not present

## 2020-05-04 DIAGNOSIS — Z5111 Encounter for antineoplastic chemotherapy: Secondary | ICD-10-CM | POA: Diagnosis not present

## 2020-05-04 DIAGNOSIS — Z95828 Presence of other vascular implants and grafts: Secondary | ICD-10-CM

## 2020-05-04 DIAGNOSIS — D702 Other drug-induced agranulocytosis: Secondary | ICD-10-CM

## 2020-05-04 DIAGNOSIS — Z86718 Personal history of other venous thrombosis and embolism: Secondary | ICD-10-CM | POA: Diagnosis not present

## 2020-05-04 DIAGNOSIS — Z7189 Other specified counseling: Secondary | ICD-10-CM

## 2020-05-04 LAB — CBC WITH DIFFERENTIAL/PLATELET
Abs Immature Granulocytes: 0 10*3/uL (ref 0.00–0.07)
Basophils Absolute: 0 10*3/uL (ref 0.0–0.1)
Basophils Relative: 0 %
Eosinophils Absolute: 0.1 10*3/uL (ref 0.0–0.5)
Eosinophils Relative: 2 %
HCT: 31.9 % — ABNORMAL LOW (ref 36.0–46.0)
Hemoglobin: 10.4 g/dL — ABNORMAL LOW (ref 12.0–15.0)
Immature Granulocytes: 0 %
Lymphocytes Relative: 52 %
Lymphs Abs: 1.6 10*3/uL (ref 0.7–4.0)
MCH: 30.6 pg (ref 26.0–34.0)
MCHC: 32.6 g/dL (ref 30.0–36.0)
MCV: 93.8 fL (ref 80.0–100.0)
Monocytes Absolute: 0.3 10*3/uL (ref 0.1–1.0)
Monocytes Relative: 10 %
Neutro Abs: 1.1 10*3/uL — ABNORMAL LOW (ref 1.7–7.7)
Neutrophils Relative %: 36 %
Platelets: 179 10*3/uL (ref 150–400)
RBC: 3.4 MIL/uL — ABNORMAL LOW (ref 3.87–5.11)
RDW: 14.1 % (ref 11.5–15.5)
WBC: 3 10*3/uL — ABNORMAL LOW (ref 4.0–10.5)
nRBC: 0 % (ref 0.0–0.2)

## 2020-05-04 LAB — CMP (CANCER CENTER ONLY)
ALT: 19 U/L (ref 0–44)
AST: 17 U/L (ref 15–41)
Albumin: 3.7 g/dL (ref 3.5–5.0)
Alkaline Phosphatase: 97 U/L (ref 38–126)
Anion gap: 6 (ref 5–15)
BUN: 14 mg/dL (ref 8–23)
CO2: 21 mmol/L — ABNORMAL LOW (ref 22–32)
Calcium: 9 mg/dL (ref 8.9–10.3)
Chloride: 112 mmol/L — ABNORMAL HIGH (ref 98–111)
Creatinine: 0.77 mg/dL (ref 0.44–1.00)
GFR, Est AFR Am: >60 mL/min (ref >60)
GFR, Estimated: >60 mL/min (ref >60)
Glucose, Bld: 79 mg/dL (ref 70–99)
Potassium: 3.8 mmol/L (ref 3.5–5.1)
Sodium: 139 mmol/L (ref 135–145)
Total Bilirubin: 1.1 mg/dL (ref 0.3–1.2)
Total Protein: 6.2 g/dL — ABNORMAL LOW (ref 6.5–8.1)

## 2020-05-04 MED ORDER — ACETAMINOPHEN 500 MG PO TABS
1000.0000 mg | ORAL_TABLET | Freq: Once | ORAL | Status: AC
  Administered 2020-05-04: 1000 mg via ORAL

## 2020-05-04 MED ORDER — HEPARIN SOD (PORK) LOCK FLUSH 100 UNIT/ML IV SOLN
500.0000 [IU] | Freq: Once | INTRAVENOUS | Status: AC | PRN
  Administered 2020-05-04: 500 [IU]
  Filled 2020-05-04: qty 5

## 2020-05-04 MED ORDER — SODIUM CHLORIDE 0.9 % IV SOLN
20.0000 mg | Freq: Once | INTRAVENOUS | Status: AC
  Administered 2020-05-04: 20 mg via INTRAVENOUS
  Filled 2020-05-04: qty 20

## 2020-05-04 MED ORDER — SODIUM CHLORIDE 0.9% FLUSH
10.0000 mL | INTRAVENOUS | Status: DC | PRN
  Administered 2020-05-04: 10 mL
  Filled 2020-05-04: qty 10

## 2020-05-04 MED ORDER — ACETAMINOPHEN 500 MG PO TABS
ORAL_TABLET | ORAL | Status: AC
  Filled 2020-05-04: qty 2

## 2020-05-04 MED ORDER — PROCHLORPERAZINE MALEATE 10 MG PO TABS
ORAL_TABLET | ORAL | Status: AC
  Filled 2020-05-04: qty 1

## 2020-05-04 MED ORDER — SODIUM CHLORIDE 0.9% FLUSH
10.0000 mL | Freq: Once | INTRAVENOUS | Status: AC
  Administered 2020-05-04: 10 mL
  Filled 2020-05-04: qty 10

## 2020-05-04 MED ORDER — SODIUM CHLORIDE 0.9 % IV SOLN
Freq: Once | INTRAVENOUS | Status: AC
  Filled 2020-05-04: qty 250

## 2020-05-04 MED ORDER — PROCHLORPERAZINE MALEATE 10 MG PO TABS
10.0000 mg | ORAL_TABLET | Freq: Once | ORAL | Status: AC
  Administered 2020-05-04: 10 mg via ORAL

## 2020-05-04 MED ORDER — DEXTROSE 5 % IV SOLN
56.0000 mg/m2 | Freq: Once | INTRAVENOUS | Status: AC
  Administered 2020-05-04: 120 mg via INTRAVENOUS
  Filled 2020-05-04: qty 60

## 2020-05-04 NOTE — Patient Instructions (Signed)

## 2020-05-04 NOTE — Patient Instructions (Signed)
[facility] Discharge Instructions for Patients Receiving Chemotherapy  Today you received the following chemotherapy agents:  Kyprolis  To help prevent nausea and vomiting after your treatment, we encourage you to take your nausea medication as directed.   If you develop nausea and vomiting that is not controlled by your nausea medication, call the clinic.   BELOW ARE SYMPTOMS THAT SHOULD BE REPORTED IMMEDIATELY:  *FEVER GREATER THAN 100.5 F  *CHILLS WITH OR WITHOUT FEVER  NAUSEA AND VOMITING THAT IS NOT CONTROLLED WITH YOUR NAUSEA MEDICATION  *UNUSUAL SHORTNESS OF BREATH  *UNUSUAL BRUISING OR BLEEDING  TENDERNESS IN MOUTH AND THROAT WITH OR WITHOUT PRESENCE OF ULCERS  *URINARY PROBLEMS  *BOWEL PROBLEMS  UNUSUAL RASH Items with * indicate a potential emergency and should be followed up as soon as possible.  Feel free to call the clinic should you have any questions or concerns. The clinic phone number is [phone].  Please show the CHEMO ALERT CARD at check-in to the Emergency Department and triage nurse.   

## 2020-05-18 ENCOUNTER — Inpatient Hospital Stay: Payer: Medicare Other

## 2020-05-18 ENCOUNTER — Ambulatory Visit: Payer: Medicare Other

## 2020-05-18 ENCOUNTER — Other Ambulatory Visit: Payer: Self-pay

## 2020-05-18 ENCOUNTER — Inpatient Hospital Stay: Payer: Medicare Other | Attending: Hematology

## 2020-05-18 VITALS — BP 120/81 | HR 76 | Temp 98.5°F | Resp 16

## 2020-05-18 DIAGNOSIS — C9000 Multiple myeloma not having achieved remission: Secondary | ICD-10-CM | POA: Diagnosis not present

## 2020-05-18 DIAGNOSIS — Z5112 Encounter for antineoplastic immunotherapy: Secondary | ICD-10-CM | POA: Insufficient documentation

## 2020-05-18 DIAGNOSIS — Z7189 Other specified counseling: Secondary | ICD-10-CM

## 2020-05-18 DIAGNOSIS — Z5111 Encounter for antineoplastic chemotherapy: Secondary | ICD-10-CM

## 2020-05-18 DIAGNOSIS — Z95828 Presence of other vascular implants and grafts: Secondary | ICD-10-CM

## 2020-05-18 LAB — CMP (CANCER CENTER ONLY)
ALT: 18 U/L (ref 0–44)
AST: 17 U/L (ref 15–41)
Albumin: 3.7 g/dL (ref 3.5–5.0)
Alkaline Phosphatase: 86 U/L (ref 38–126)
Anion gap: 7 (ref 5–15)
BUN: 13 mg/dL (ref 8–23)
CO2: 22 mmol/L (ref 22–32)
Calcium: 9.1 mg/dL (ref 8.9–10.3)
Chloride: 113 mmol/L — ABNORMAL HIGH (ref 98–111)
Creatinine: 0.89 mg/dL (ref 0.44–1.00)
GFR, Est AFR Am: >60 mL/min (ref >60)
GFR, Estimated: >60 mL/min (ref >60)
Glucose, Bld: 98 mg/dL (ref 70–99)
Potassium: 4 mmol/L (ref 3.5–5.1)
Sodium: 142 mmol/L (ref 135–145)
Total Bilirubin: 0.8 mg/dL (ref 0.3–1.2)
Total Protein: 6.1 g/dL — ABNORMAL LOW (ref 6.5–8.1)

## 2020-05-18 LAB — CBC WITH DIFFERENTIAL/PLATELET
Abs Immature Granulocytes: 0.01 10*3/uL (ref 0.00–0.07)
Basophils Absolute: 0 10*3/uL (ref 0.0–0.1)
Basophils Relative: 1 %
Eosinophils Absolute: 0.1 10*3/uL (ref 0.0–0.5)
Eosinophils Relative: 3 %
HCT: 31.1 % — ABNORMAL LOW (ref 36.0–46.0)
Hemoglobin: 10.2 g/dL — ABNORMAL LOW (ref 12.0–15.0)
Immature Granulocytes: 0 %
Lymphocytes Relative: 41 %
Lymphs Abs: 1.3 10*3/uL (ref 0.7–4.0)
MCH: 30.8 pg (ref 26.0–34.0)
MCHC: 32.8 g/dL (ref 30.0–36.0)
MCV: 94 fL (ref 80.0–100.0)
Monocytes Absolute: 0.3 10*3/uL (ref 0.1–1.0)
Monocytes Relative: 9 %
Neutro Abs: 1.5 10*3/uL — ABNORMAL LOW (ref 1.7–7.7)
Neutrophils Relative %: 46 %
Platelets: 196 10*3/uL (ref 150–400)
RBC: 3.31 MIL/uL — ABNORMAL LOW (ref 3.87–5.11)
RDW: 14.4 % (ref 11.5–15.5)
WBC: 3.3 10*3/uL — ABNORMAL LOW (ref 4.0–10.5)
nRBC: 0 % (ref 0.0–0.2)

## 2020-05-18 MED ORDER — SODIUM CHLORIDE 0.9% FLUSH
10.0000 mL | INTRAVENOUS | Status: DC | PRN
  Administered 2020-05-18: 10 mL
  Filled 2020-05-18: qty 10

## 2020-05-18 MED ORDER — ACETAMINOPHEN 500 MG PO TABS
1000.0000 mg | ORAL_TABLET | Freq: Once | ORAL | Status: AC
  Administered 2020-05-18: 1000 mg via ORAL

## 2020-05-18 MED ORDER — SODIUM CHLORIDE 0.9% FLUSH
10.0000 mL | Freq: Once | INTRAVENOUS | Status: AC
  Administered 2020-05-18: 10 mL
  Filled 2020-05-18: qty 10

## 2020-05-18 MED ORDER — DEXTROSE 5 % IV SOLN
56.0000 mg/m2 | Freq: Once | INTRAVENOUS | Status: AC
  Administered 2020-05-18: 120 mg via INTRAVENOUS
  Filled 2020-05-18: qty 60

## 2020-05-18 MED ORDER — SODIUM CHLORIDE 0.9 % IV SOLN
Freq: Once | INTRAVENOUS | Status: AC
  Filled 2020-05-18: qty 250

## 2020-05-18 MED ORDER — PROCHLORPERAZINE MALEATE 10 MG PO TABS
ORAL_TABLET | ORAL | Status: AC
  Filled 2020-05-18: qty 1

## 2020-05-18 MED ORDER — HEPARIN SOD (PORK) LOCK FLUSH 100 UNIT/ML IV SOLN
500.0000 [IU] | Freq: Once | INTRAVENOUS | Status: AC | PRN
  Administered 2020-05-18: 500 [IU]
  Filled 2020-05-18: qty 5

## 2020-05-18 MED ORDER — PROCHLORPERAZINE MALEATE 10 MG PO TABS
10.0000 mg | ORAL_TABLET | Freq: Once | ORAL | Status: AC
  Administered 2020-05-18: 10 mg via ORAL

## 2020-05-18 MED ORDER — SODIUM CHLORIDE 0.9 % IV SOLN
20.0000 mg | Freq: Once | INTRAVENOUS | Status: AC
  Administered 2020-05-18: 20 mg via INTRAVENOUS
  Filled 2020-05-18: qty 20

## 2020-05-18 MED ORDER — ACETAMINOPHEN 325 MG PO TABS
ORAL_TABLET | ORAL | Status: AC
  Filled 2020-05-18: qty 2

## 2020-06-01 ENCOUNTER — Inpatient Hospital Stay: Payer: Medicare Other

## 2020-06-01 ENCOUNTER — Other Ambulatory Visit: Payer: Self-pay

## 2020-06-01 VITALS — BP 117/75 | HR 73 | Temp 98.2°F | Resp 18 | Wt 191.0 lb

## 2020-06-01 DIAGNOSIS — C9000 Multiple myeloma not having achieved remission: Secondary | ICD-10-CM

## 2020-06-01 DIAGNOSIS — Z7189 Other specified counseling: Secondary | ICD-10-CM

## 2020-06-01 DIAGNOSIS — D702 Other drug-induced agranulocytosis: Secondary | ICD-10-CM

## 2020-06-01 DIAGNOSIS — Z5111 Encounter for antineoplastic chemotherapy: Secondary | ICD-10-CM

## 2020-06-01 DIAGNOSIS — Z5112 Encounter for antineoplastic immunotherapy: Secondary | ICD-10-CM | POA: Diagnosis not present

## 2020-06-01 DIAGNOSIS — Z95828 Presence of other vascular implants and grafts: Secondary | ICD-10-CM

## 2020-06-01 LAB — CBC WITH DIFFERENTIAL/PLATELET
Abs Immature Granulocytes: 0.01 10*3/uL (ref 0.00–0.07)
Basophils Absolute: 0 10*3/uL (ref 0.0–0.1)
Basophils Relative: 1 %
Eosinophils Absolute: 0.1 10*3/uL (ref 0.0–0.5)
Eosinophils Relative: 3 %
HCT: 31.8 % — ABNORMAL LOW (ref 36.0–46.0)
Hemoglobin: 10.2 g/dL — ABNORMAL LOW (ref 12.0–15.0)
Immature Granulocytes: 0 %
Lymphocytes Relative: 52 %
Lymphs Abs: 1.6 10*3/uL (ref 0.7–4.0)
MCH: 30.4 pg (ref 26.0–34.0)
MCHC: 32.1 g/dL (ref 30.0–36.0)
MCV: 94.6 fL (ref 80.0–100.0)
Monocytes Absolute: 0.3 10*3/uL (ref 0.1–1.0)
Monocytes Relative: 11 %
Neutro Abs: 1 10*3/uL — ABNORMAL LOW (ref 1.7–7.7)
Neutrophils Relative %: 33 %
Platelets: 185 10*3/uL (ref 150–400)
RBC: 3.36 MIL/uL — ABNORMAL LOW (ref 3.87–5.11)
RDW: 14.7 % (ref 11.5–15.5)
WBC: 3.1 10*3/uL — ABNORMAL LOW (ref 4.0–10.5)
nRBC: 0 % (ref 0.0–0.2)

## 2020-06-01 LAB — CMP (CANCER CENTER ONLY)
ALT: 16 U/L (ref 0–44)
AST: 18 U/L (ref 15–41)
Albumin: 3.7 g/dL (ref 3.5–5.0)
Alkaline Phosphatase: 88 U/L (ref 38–126)
Anion gap: 8 (ref 5–15)
BUN: 11 mg/dL (ref 8–23)
CO2: 22 mmol/L (ref 22–32)
Calcium: 9 mg/dL (ref 8.9–10.3)
Chloride: 110 mmol/L (ref 98–111)
Creatinine: 0.8 mg/dL (ref 0.44–1.00)
GFR, Est AFR Am: >60 mL/min (ref >60)
GFR, Estimated: >60 mL/min (ref >60)
Glucose, Bld: 76 mg/dL (ref 70–99)
Potassium: 3.9 mmol/L (ref 3.5–5.1)
Sodium: 140 mmol/L (ref 135–145)
Total Bilirubin: 1.1 mg/dL (ref 0.3–1.2)
Total Protein: 6.2 g/dL — ABNORMAL LOW (ref 6.5–8.1)

## 2020-06-01 MED ORDER — SODIUM CHLORIDE 0.9 % IV SOLN
Freq: Once | INTRAVENOUS | Status: AC
  Filled 2020-06-01: qty 250

## 2020-06-01 MED ORDER — SODIUM CHLORIDE 0.9% FLUSH
10.0000 mL | INTRAVENOUS | Status: DC | PRN
  Administered 2020-06-01: 10 mL
  Filled 2020-06-01: qty 10

## 2020-06-01 MED ORDER — DEXTROSE 5 % IV SOLN: 56.0000 mg/m2 | Freq: Once | INTRAVENOUS | Status: DC

## 2020-06-01 MED ORDER — ACETAMINOPHEN 500 MG PO TABS
1000.0000 mg | ORAL_TABLET | Freq: Once | ORAL | Status: AC
  Administered 2020-06-01: 1000 mg via ORAL

## 2020-06-01 MED ORDER — ZOLEDRONIC ACID 4 MG/100ML IV SOLN
INTRAVENOUS | Status: AC
  Filled 2020-06-01: qty 100

## 2020-06-01 MED ORDER — ACETAMINOPHEN 500 MG PO TABS
ORAL_TABLET | ORAL | Status: AC
  Filled 2020-06-01: qty 2

## 2020-06-01 MED ORDER — PROCHLORPERAZINE MALEATE 10 MG PO TABS
10.0000 mg | ORAL_TABLET | Freq: Once | ORAL | Status: AC
  Administered 2020-06-01: 10 mg via ORAL

## 2020-06-01 MED ORDER — HEPARIN SOD (PORK) LOCK FLUSH 100 UNIT/ML IV SOLN
500.0000 [IU] | Freq: Once | INTRAVENOUS | Status: AC | PRN
  Administered 2020-06-01: 500 [IU]
  Filled 2020-06-01: qty 5

## 2020-06-01 MED ORDER — SODIUM CHLORIDE 0.9% FLUSH
10.0000 mL | Freq: Once | INTRAVENOUS | Status: AC
  Administered 2020-06-01: 10 mL
  Filled 2020-06-01: qty 10

## 2020-06-01 MED ORDER — SODIUM CHLORIDE 0.9 % IV SOLN
20.0000 mg | Freq: Once | INTRAVENOUS | Status: AC
  Administered 2020-06-01: 20 mg via INTRAVENOUS
  Filled 2020-06-01: qty 20

## 2020-06-01 MED ORDER — PROCHLORPERAZINE MALEATE 10 MG PO TABS
ORAL_TABLET | ORAL | Status: AC
  Filled 2020-06-01: qty 1

## 2020-06-01 MED ORDER — DEXTROSE 5 % IV SOLN
56.0000 mg/m2 | Freq: Once | INTRAVENOUS | Status: AC
  Administered 2020-06-01: 120 mg via INTRAVENOUS
  Filled 2020-06-01: qty 60

## 2020-06-01 MED ORDER — ZOLEDRONIC ACID 4 MG/100ML IV SOLN
4.0000 mg | Freq: Once | INTRAVENOUS | Status: AC
  Administered 2020-06-01: 4 mg via INTRAVENOUS

## 2020-06-01 NOTE — Patient Instructions (Signed)

## 2020-06-01 NOTE — Patient Instructions (Signed)
[facility] Discharge Instructions for Patients Receiving Chemotherapy  Today you received the following chemotherapy agents:  Kyprolis  To help prevent nausea and vomiting after your treatment, we encourage you to take your nausea medication as directed.   If you develop nausea and vomiting that is not controlled by your nausea medication, call the clinic.   BELOW ARE SYMPTOMS THAT SHOULD BE REPORTED IMMEDIATELY:  *FEVER GREATER THAN 100.5 F  *CHILLS WITH OR WITHOUT FEVER  NAUSEA AND VOMITING THAT IS NOT CONTROLLED WITH YOUR NAUSEA MEDICATION  *UNUSUAL SHORTNESS OF BREATH  *UNUSUAL BRUISING OR BLEEDING  TENDERNESS IN MOUTH AND THROAT WITH OR WITHOUT PRESENCE OF ULCERS  *URINARY PROBLEMS  *BOWEL PROBLEMS  UNUSUAL RASH Items with * indicate a potential emergency and should be followed up as soon as possible.  Feel free to call the clinic should you have any questions or concerns. The clinic phone number is [phone].  Please show the CHEMO ALERT CARD at check-in to the Emergency Department and triage nurse.   

## 2020-06-01 NOTE — Progress Notes (Signed)
ANC today is 1.0, ok to treat per Dr. Irene Limbo.

## 2020-06-08 DIAGNOSIS — Z7983 Long term (current) use of bisphosphonates: Secondary | ICD-10-CM | POA: Diagnosis not present

## 2020-06-08 DIAGNOSIS — Z23 Encounter for immunization: Secondary | ICD-10-CM | POA: Diagnosis not present

## 2020-06-08 DIAGNOSIS — Z79899 Other long term (current) drug therapy: Secondary | ICD-10-CM | POA: Diagnosis not present

## 2020-06-08 DIAGNOSIS — C9001 Multiple myeloma in remission: Secondary | ICD-10-CM | POA: Diagnosis not present

## 2020-06-08 DIAGNOSIS — M549 Dorsalgia, unspecified: Secondary | ICD-10-CM | POA: Diagnosis not present

## 2020-06-08 DIAGNOSIS — C9000 Multiple myeloma not having achieved remission: Secondary | ICD-10-CM | POA: Diagnosis not present

## 2020-06-08 DIAGNOSIS — Z9484 Stem cells transplant status: Secondary | ICD-10-CM | POA: Diagnosis not present

## 2020-06-08 DIAGNOSIS — B001 Herpesviral vesicular dermatitis: Secondary | ICD-10-CM | POA: Diagnosis not present

## 2020-06-14 ENCOUNTER — Other Ambulatory Visit: Payer: Self-pay | Admitting: Internal Medicine

## 2020-06-14 DIAGNOSIS — Z1231 Encounter for screening mammogram for malignant neoplasm of breast: Secondary | ICD-10-CM

## 2020-06-14 DIAGNOSIS — K625 Hemorrhage of anus and rectum: Secondary | ICD-10-CM | POA: Diagnosis not present

## 2020-06-15 ENCOUNTER — Ambulatory Visit: Payer: Medicare Other

## 2020-06-15 ENCOUNTER — Encounter (INDEPENDENT_AMBULATORY_CARE_PROVIDER_SITE_OTHER): Payer: Self-pay | Admitting: *Deleted

## 2020-06-15 ENCOUNTER — Inpatient Hospital Stay: Payer: Medicare Other | Attending: Hematology

## 2020-06-15 ENCOUNTER — Inpatient Hospital Stay: Payer: Medicare Other

## 2020-06-15 ENCOUNTER — Inpatient Hospital Stay (HOSPITAL_BASED_OUTPATIENT_CLINIC_OR_DEPARTMENT_OTHER): Payer: Medicare Other | Admitting: Hematology

## 2020-06-15 ENCOUNTER — Other Ambulatory Visit: Payer: Medicare Other

## 2020-06-15 ENCOUNTER — Other Ambulatory Visit: Payer: Self-pay

## 2020-06-15 VITALS — BP 142/87 | HR 87 | Temp 99.0°F | Resp 18 | Ht 63.0 in | Wt 187.9 lb

## 2020-06-15 DIAGNOSIS — Z95828 Presence of other vascular implants and grafts: Secondary | ICD-10-CM

## 2020-06-15 DIAGNOSIS — C9000 Multiple myeloma not having achieved remission: Secondary | ICD-10-CM | POA: Insufficient documentation

## 2020-06-15 DIAGNOSIS — Z5112 Encounter for antineoplastic immunotherapy: Secondary | ICD-10-CM | POA: Insufficient documentation

## 2020-06-15 DIAGNOSIS — Z23 Encounter for immunization: Secondary | ICD-10-CM | POA: Insufficient documentation

## 2020-06-15 DIAGNOSIS — D702 Other drug-induced agranulocytosis: Secondary | ICD-10-CM

## 2020-06-15 DIAGNOSIS — D709 Neutropenia, unspecified: Secondary | ICD-10-CM | POA: Insufficient documentation

## 2020-06-15 DIAGNOSIS — M8580 Other specified disorders of bone density and structure, unspecified site: Secondary | ICD-10-CM | POA: Insufficient documentation

## 2020-06-15 DIAGNOSIS — Z5111 Encounter for antineoplastic chemotherapy: Secondary | ICD-10-CM

## 2020-06-15 DIAGNOSIS — Z7189 Other specified counseling: Secondary | ICD-10-CM

## 2020-06-15 DIAGNOSIS — Z86718 Personal history of other venous thrombosis and embolism: Secondary | ICD-10-CM | POA: Diagnosis not present

## 2020-06-15 LAB — CBC WITH DIFFERENTIAL/PLATELET
Abs Immature Granulocytes: 0.01 10*3/uL (ref 0.00–0.07)
Basophils Absolute: 0 10*3/uL (ref 0.0–0.1)
Basophils Relative: 1 %
Eosinophils Absolute: 0.1 10*3/uL (ref 0.0–0.5)
Eosinophils Relative: 3 %
HCT: 31.9 % — ABNORMAL LOW (ref 36.0–46.0)
Hemoglobin: 10.3 g/dL — ABNORMAL LOW (ref 12.0–15.0)
Immature Granulocytes: 0 %
Lymphocytes Relative: 48 %
Lymphs Abs: 1.8 10*3/uL (ref 0.7–4.0)
MCH: 30.4 pg (ref 26.0–34.0)
MCHC: 32.3 g/dL (ref 30.0–36.0)
MCV: 94.1 fL (ref 80.0–100.0)
Monocytes Absolute: 0.4 10*3/uL (ref 0.1–1.0)
Monocytes Relative: 12 %
Neutro Abs: 1.4 10*3/uL — ABNORMAL LOW (ref 1.7–7.7)
Neutrophils Relative %: 36 %
Platelets: 200 10*3/uL (ref 150–400)
RBC: 3.39 MIL/uL — ABNORMAL LOW (ref 3.87–5.11)
RDW: 15 % (ref 11.5–15.5)
WBC: 3.8 10*3/uL — ABNORMAL LOW (ref 4.0–10.5)
nRBC: 0 % (ref 0.0–0.2)

## 2020-06-15 LAB — CMP (CANCER CENTER ONLY)
ALT: 17 U/L (ref 0–44)
AST: 17 U/L (ref 15–41)
Albumin: 3.9 g/dL (ref 3.5–5.0)
Alkaline Phosphatase: 84 U/L (ref 38–126)
Anion gap: 8 (ref 5–15)
BUN: 14 mg/dL (ref 8–23)
CO2: 24 mmol/L (ref 22–32)
Calcium: 9.3 mg/dL (ref 8.9–10.3)
Chloride: 112 mmol/L — ABNORMAL HIGH (ref 98–111)
Creatinine: 0.79 mg/dL (ref 0.44–1.00)
GFR, Est AFR Am: >60 mL/min (ref >60)
GFR, Estimated: >60 mL/min (ref >60)
Glucose, Bld: 84 mg/dL (ref 70–99)
Potassium: 3.9 mmol/L (ref 3.5–5.1)
Sodium: 144 mmol/L (ref 135–145)
Total Bilirubin: 0.8 mg/dL (ref 0.3–1.2)
Total Protein: 6.5 g/dL (ref 6.5–8.1)

## 2020-06-15 MED ORDER — HEPARIN SOD (PORK) LOCK FLUSH 100 UNIT/ML IV SOLN
500.0000 [IU] | Freq: Once | INTRAVENOUS | Status: AC | PRN
  Administered 2020-06-15: 500 [IU]
  Filled 2020-06-15: qty 5

## 2020-06-15 MED ORDER — SODIUM CHLORIDE 0.9 % IV SOLN
Freq: Once | INTRAVENOUS | Status: AC
  Filled 2020-06-15: qty 250

## 2020-06-15 MED ORDER — PROCHLORPERAZINE MALEATE 10 MG PO TABS
ORAL_TABLET | ORAL | Status: AC
  Filled 2020-06-15: qty 1

## 2020-06-15 MED ORDER — ACETAMINOPHEN 500 MG PO TABS
ORAL_TABLET | ORAL | Status: AC
  Filled 2020-06-15: qty 2

## 2020-06-15 MED ORDER — SODIUM CHLORIDE 0.9 % IV SOLN
20.0000 mg | Freq: Once | INTRAVENOUS | Status: AC
  Administered 2020-06-15: 20 mg via INTRAVENOUS
  Filled 2020-06-15: qty 20

## 2020-06-15 MED ORDER — ACETAMINOPHEN 500 MG PO TABS
1000.0000 mg | ORAL_TABLET | Freq: Once | ORAL | Status: AC
  Administered 2020-06-15: 1000 mg via ORAL

## 2020-06-15 MED ORDER — SODIUM CHLORIDE 0.9% FLUSH
10.0000 mL | INTRAVENOUS | Status: DC | PRN
  Administered 2020-06-15: 10 mL
  Filled 2020-06-15: qty 10

## 2020-06-15 MED ORDER — DEXTROSE 5 % IV SOLN
56.0000 mg/m2 | Freq: Once | INTRAVENOUS | Status: AC
  Administered 2020-06-15: 120 mg via INTRAVENOUS
  Filled 2020-06-15: qty 60

## 2020-06-15 MED ORDER — PROCHLORPERAZINE MALEATE 10 MG PO TABS
10.0000 mg | ORAL_TABLET | Freq: Once | ORAL | Status: AC
  Administered 2020-06-15: 10 mg via ORAL

## 2020-06-15 MED ORDER — SODIUM CHLORIDE 0.9% FLUSH
10.0000 mL | Freq: Once | INTRAVENOUS | Status: AC | PRN
  Administered 2020-06-15: 10 mL
  Filled 2020-06-15: qty 10

## 2020-06-15 NOTE — Patient Instructions (Signed)

## 2020-06-15 NOTE — Progress Notes (Signed)
HEMATOLOGY/ONCOLOGY CLINIC NOTE  Date of Service: 06/15/20     PCP: Madison Board MD  CHIEF COMPLAINTS/PURPOSE OF CONSULTATION:  Continued f/u for mx of myeloma   HISTORY OF PRESENTING ILLNESS:   Madison Parker is a wonderful 69 y.o. female who has been referred to [place] by Dr Madison Parker  for evaluation and management of smoldering multiple myeloma.  Patient has a history of smoldering multiple myeloma which she reports she was diagnosed with in 2011 when she was evaluated by a hematologist in [place]. She reports that she presented with low blood counts (low WBC)   and after significant workup she had a bone marrow examination based on which she was told that she has smoldering multiple myeloma. Patient notes that she had a bone survey at that time which was negative. She was monitored there closely and then moved to [facility] in 2015 to stay with her son whose wife was being treated for breast cancer. Patient was following with Dr. Delight Parker in [place]. She reports not having had any treatment for multiple myeloma.  Her last labs from about a year ago showed a SPEP with no OBSERVED M spike . Serum free light chains showed an elevation of kappa Free light chain 310 and lambda free light chain of 12.7 with an abnormal Kappa/ Lambda ratio of 24.38 ( up from 19 previously). Random urine showed an M protein complement of 44%.  She lives in [place] and requested transfer of care to [place]. She was offered follow-up but [facility] in [place]  But prefers to  follow [place] in [place]. Patient reports her energy levels been stable. She has chronic back pain which has improved since the patient had her spinal surgery in April 2017 (L4-L5 interbody fusion). Patient notes she has had some chronic left hip pain which is somewhat more bothersome with the navy on the lateral aspect of her upper thighs that's painful. She also notes some right hip pain.  Over the last few weeks he notes some upper neck pain especially when bending her neck backwards and tingling numbness in her right upper extremity.  Has been working on losing some weight voluntarily.  No acute other new symptoms.  AUTOLOGOUS STEM CELL TRANSPLANT:  Disease status at time of transplant: sCR MRD positive  ASBMT risk stratification: low risk, ECOG/KPS: 0 / 90%, HSCT-CI score: 0  Started mobilization on 09/03/19 using GCS-F and Plerixafor. She collected stem cells on 09/07/19. The pre-processing total was 7.18x10(6) CD34+ cells/kg. The post-processing total was 1.11x10(7) CD34+ cells/kg frozen in 3 bags.  Preparative regimen with Melphalan 200 mg/m2 on 09/14/19 (outpatient).  Infusion of stem cells - 7.4 x 10(6) on 09/15/19 (outpatient)  Admitted Day +8 for neutropenic fever, uncontrolled n/v, diarrhea  Treated with short course of prednisone for peri-engraftment syndrome  TREATMENT:  Started induction therapy with carfilozomib, lenlaidomide, and dexamethasone 28 day cycle on 04/27/2019. She had a great response to initial cycle. She developed a DVT on 06/24/19 and was started on apixaban as an anticoagulant. She had normalization of her light chains by the start of C4 on 07/28/19.  She was referred to the myeloma clinic at [facility] for BMT consult and seen on 08/10/19. Serology from her visit showed no M-spike by SPEP, a free light chain ratio of 1.07 (kappa 11.42, lambda 10.65), and an abnormal beta-2-micro of 3.02. She was started on pre-transplant evaluation while undergoing C5 of therapy.  Treatment discontinued in 07/2019 in  preparation for autologous stem cell transplant.   INTERVAL HISTORY:   Madison Parker returns today for f/u of her multiple myeloma. She is s/p HDT -Auto HSCT on 09/15/2019. She is here for maintenance Carfilzomib. The patient's last visit with [place] was on 05/04/2020. The pt reports that she is doing well overall.  The pt reports that she received her  post-transplant vaccines at [facility] last week. Pt had a sore arm, but no other concerns. She has her Mammogram and Colonoscopy scheduled. Pt has been drinking lots of coffee and does not believe that she has been drinking an adequate amount of water.  Lab results today (06/15/20) of CBC w/diff and CMP is as follows: all values are WNL except for WBC at 3.8K, RBC at 3.39, Hgb at 10.3, HCT at 31.9, Neutro Abs at 1.4K, Chloride at 112. 06/15/2020 MMP - no M spike 06/15/2020 K/L light chains - WNL  On review of systems, pt denies new bone pain, fatigue and any other symptoms.   MEDICAL HISTORY:  Past Medical History:  Diagnosis Date  . Headache   . Low back pain   . SBO (small bowel obstruction) ([place]) 2010  . Smoldering multiple myeloma ([facility])   Previous history of hypothyroidism- not currently medications Anxiety Obesity .Body mass index is 33.28 kg/m. Vitamin D deficiency Smoldering multiple myeloma diagnosed in 2011 Lumbosacral radiculopathy Left hip pain  SURGICAL HISTORY: Past Surgical History:  Procedure Laterality Date  . ABDOMINAL HYSTERECTOMY     complete  . BACK SURGERY     lower at [facility]  . colonscopy  2014  . GASTRIC BYPASS  yrs ago  . HERNIA REPAIR    . IR IMAGING GUIDED PORT INSERTION  04/20/2019  . KNEE SURGERY  06/04/2009   both knees replaced   . TONSILLECTOMY  age 40  . TOTAL HIP ARTHROPLASTY Left 08/16/2017   Procedure: LEFT TOTAL HIP ARTHROPLASTY ANTERIOR APPROACH;  Surgeon: Madison Leitz, MD;  Location: [facility];  Service: Orthopedics;  Laterality: Left;  Spinal surgery April 2017  SOCIAL HISTORY: Social History   Socioeconomic History  . Marital status: Married    Spouse name: Not on file  . Number of children: Not on file  . Years of education: Not on file  . Highest education level: Not on file  Occupational History  . Not on file  Tobacco Use  . Smoking status: Former Smoker    Packs/day: 0.50    Years: 10.00    Pack years: 5.00    Types:  Cigarettes  . Smokeless tobacco: Never Used  . Tobacco comment: quit 1977  Vaping Use  . Vaping Use: Never used  Substance and Sexual Activity  . Alcohol use: Yes    Comment: occ  . Drug use: No  . Sexual activity: Not on file  Other Topics Concern  . Not on file  Social History Narrative  . Not on file   Social Determinants of Health   Financial Resource Strain:   . Difficulty of Paying Living Expenses: Not on file  Food Insecurity:   . Worried About Charity fundraiser in the Last Year: Not on file  . Ran Out of Food in the Last Year: Not on file  Transportation Needs:   . Lack of Transportation (Medical): Not on file  . Lack of Transportation (Non-Medical): Not on file  Physical Activity:   . Days of Exercise per Week: Not on file  . Minutes of Exercise per Session: Not on file  Stress:   . Feeling of Stress : Not on file  Social Connections:   . Frequency of Communication with Friends and Family: Not on file  . Frequency of Social Gatherings with Friends and Family: Not on file  . Attends Religious Services: Not on file  . Active Member of Clubs or Organizations: Not on file  . Attends Archivist Meetings: Not on file  . Marital Status: Not on file  Intimate Partner Violence:   . Fear of Current or Ex-Partner: Not on file  . Emotionally Abused: Not on file  . Physically Abused: Not on file  . Sexually Abused: Not on file  Occasional alcohol use Former smoker and smoked 1 pack per week for about 9 years has since quit.  FAMILY HISTORY: No family history on file.  ALLERGIES:  is allergic to codeine.  MEDICATIONS:  Current Outpatient Medications  Medication Sig Dispense Refill  . acyclovir (ZOVIRAX) 400 MG tablet Take 1 tablet (400 mg total) by mouth 2 (two) times daily. 180 tablet 0  . Calcium Carbonate (CALCIUM 600 PO) Take 1 tablet by mouth daily.    . ergocalciferol (VITAMIN D2) 1.25 MG (50000 UT) capsule Take 50,000 Units by mouth once a week.      Marland Kitchen FOLIC ACID PO Take 1 tablet by mouth.    . lidocaine-prilocaine (EMLA) cream Apply 1 application topically as needed. 30 g 2  . MULTIPLE VITAMIN PO Take 1 tablet by mouth daily.    . ondansetron (ZOFRAN) 8 MG tablet Take 1 tablet (8 mg total) by mouth 2 (two) times daily as needed (Nausea or vomiting). 30 tablet 1  . prochlorperazine (COMPAZINE) 10 MG tablet Take 1 tablet (10 mg total) by mouth every 6 (six) hours as needed for nausea or vomiting. 30 tablet 0  . TURMERIC-GINGER PO Take 1 each by mouth daily. Takes 1 per day    . vitamin B-12 (CYANOCOBALAMIN) 1000 MCG tablet Take 5,000 mcg by mouth daily.      No current facility-administered medications for this visit.   Facility-Administered Medications Ordered in Other Visits  Medication Dose Route Frequency Provider Last Rate Last Admin  . sodium chloride flush (NS) 0.9 % injection 10 mL  10 mL Intracatheter PRN Brunetta Genera, MD   10 mL at 05/11/19 1437    REVIEW OF SYSTEMS:  A 10+ POINT REVIEW OF SYSTEMS WAS OBTAINED including neurology, dermatology, psychiatry, cardiac, respiratory, lymph, extremities, GI, GU, Musculoskeletal, constitutional, breasts, reproductive, HEENT.  All pertinent positives are noted in the HPI.  All others are negative.   PHYSICAL EXAMINATION: ECOG FS:1 - Symptomatic but completely ambulatory  Vitals:   06/15/20 1446  BP: (!) 142/87  Pulse: 87  Resp: 18  Temp: 99 F (37.2 C)  SpO2: 100%   Wt Readings from Last 3 Encounters:  06/15/20 187 lb 14.4 oz (85.2 kg)  06/01/20 191 lb (86.6 kg)  05/04/20 187 lb 3.2 oz (84.9 kg)   Body mass index is 33.28 kg/m.    GENERAL:alert, in no acute distress and comfortable SKIN: no acute rashes, no significant lesions EYES: conjunctiva are pink and non-injected, sclera anicteric OROPHARYNX: MMM, no exudates, no oropharyngeal erythema or ulceration NECK: supple, no JVD LYMPH:  no palpable lymphadenopathy in the cervical, axillary or inguinal  regions LUNGS: clear to auscultation b/l with normal respiratory effort HEART: regular rate & rhythm ABDOMEN:  normoactive bowel sounds , non tender, not distended. No palpable hepatosplenomegaly.  Extremity: no pedal edema PSYCH: alert &  oriented x 3 with fluent speech NEURO: no focal motor/sensory deficits  LABORATORY DATA:  I have reviewed the data as listed  . CBC Latest Ref Rng & Units 06/15/2020 06/01/2020 05/18/2020  WBC 4.0 - 10.5 K/uL 3.8(L) 3.1(L) 3.3(L)  Hemoglobin 12.0 - 15.0 g/dL 10.3(L) 10.2(L) 10.2(L)  Hematocrit 36 - 46 % 31.9(L) 31.8(L) 31.1(L)  Platelets 150 - 400 K/uL 200 185 196  ANC 1300 . CBC    Component Value Date/Time   WBC 3.8 (L) 06/15/2020 1345   RBC 3.39 (L) 06/15/2020 1345   HGB 10.3 (L) 06/15/2020 1345   HGB 11.3 (L) 11/12/2019 0836   HGB 11.3 (L) 09/30/2017 1145   HCT 31.9 (L) 06/15/2020 1345   HCT 35.1 09/30/2017 1145   PLT 200 06/15/2020 1345   PLT 149 (L) 11/12/2019 0836   PLT 172 09/30/2017 1145   MCV 94.1 06/15/2020 1345   MCV 97.5 09/30/2017 1145   MCH 30.4 06/15/2020 1345   MCHC 32.3 06/15/2020 1345   RDW 15.0 06/15/2020 1345   RDW 14.0 09/30/2017 1145   LYMPHSABS 1.8 06/15/2020 1345   LYMPHSABS 1.3 09/30/2017 1145   MONOABS 0.4 06/15/2020 1345   MONOABS 0.2 09/30/2017 1145   EOSABS 0.1 06/15/2020 1345   EOSABS 0.0 09/30/2017 1145   EOSABS 0.1 06/03/2014 1003   BASOSABS 0.0 06/15/2020 1345   BASOSABS 0.0 09/30/2017 1145     . CMP Latest Ref Rng & Units 06/15/2020 06/01/2020 05/18/2020  Glucose 70 - 99 mg/dL 84 76 98  BUN 8 - 23 mg/dL _0 Creatinine 0.44 - 1.00 mg/dL 0.79 0.80 0.89  Sodium 135 - 145 mmol/L 144 140 142  Potassium 3.5 - 5.1 mmol/L 3.9 3.9 4.0  Chloride 98 - 111 mmol/L 112(H) 110 113(H)  CO2 22 - 32 mmol/L _1 Calcium 8.9 - 10.3 mg/dL 9.3 9.0 9.1  Total Protein 6.5 - 8.1 g/dL 6.5 6.2(L) 6.1(L)  Total Bilirubin 0.3 - 1.2 mg/dL 0.8 1.1 0.8  Alkaline Phos 38 - 126 U/L 84 88 86  AST 15 - 41 U/L _2 ALT 0 - 44 U/L _3 06/08/2020 K/L light chains:         Component     Latest Ref Rng & Units 11/08/2016  LDH     125 - 245 U/L 220  Beta 2     0.6 - 2.4 mg/L 2.5 (H)   Component     Latest Ref Rng & Units 07/30/2017  Ferritin     9 - 269 ng/ml 81  Vitamin B12     232 - 1,245 pg/mL 594   Magnesium: Component Magnesium  Latest Ref Rng & Units 1.7 - 2.4 mg/dL  10/02/2019 1.7  10/21/2019 1.7  11/12/2019 1.7    03/11/19 BM Bx:   03/11/19 Cytogenetics:            RADIOGRAPHIC STUDIES: I have personally reviewed the radiological images as listed and agreed with the findings in the report. No results found.   Bone survey 11/08/2016 IMPRESSION: 1. Small lytic lesion in the right scapula. 2. Possible small lytic lesion in the left scapula. 3. Postoperative changes. 4. Significant degenerative changes in both hips, left greater than right. 5. No evidence for acute fracture   Electronically Signed   By: Nolon Nations M.D.   On: 11/08/2016 16:19   ASSESSMENT & PLAN:   69 y.o. very pleasant lady with history of  1) Multiple myeloma (concern for small lytic lesions in left and right scapulae) - (appears light chain producing) and Progressive anemia  This was apparently diagnosed as smoldering myeloma in 2011 by her hematologist in [place].  No renal failure/hypercalcemia.  Bone survey with concern for possible small lytic lesions in B/L scapulae. PET/CT scan on 03/12/2017 showed no concerning bone lesions.  Initial Bm Bx with 17% kappa restricted plasma cells consistent with plasma cell neoplasm.  No treatment so far.  03/11/19 BM Bx revealed involvement by 50% plasma cells; genetics -high risk t(14;16), previous genetics from April 2018 BM Bx were standard risk  03/16/19 MRI Lumbar reveals several explanations for her lower back pain including L2-L3 stenosis, but reassuringly, there was not evidence of involvement by multiple  myeloma  04/01/19 PET/CT which revealed no FDG evidence of active multiple myeloma within the skeleton. No evidence of lytic lesions within the skeleton or soft tissue Plasmacytoma.  05/07/2019 DXA scan revealed osteopenia, T-score of -1.2.  2) Chronic leukopenia - ? Related to SMM vs other nutritional deficiencies (h/o gastric bypass surgery put her at risk for nutritional deficiencies)  B12 levels WNL  Ferritin adequate  ?additional factor -recent NSAID use.  ?element of benign ethnic neutropenia.  Has not had any issues with frequent infection.   ANC post-operative normalized today at 1700  4) Neuropathy/radiculopathy -Continue follow up with orthopedics  5) Superficial Venous Thrombosis of the left small saphenous vein  06/24/2019 US venous left : Right: There is no evidence of deep vein thrombosis in the lower extremity. No cystic structure found in the popliteal fossa. Left: Findings consistent with acute superficial vein thrombosis involving the left small saphenous vein. There is no evidence of deep vein thrombosis in the lower extremity. However, portions of this examination were limited- see technologist comments above. No cystic structure found in the popliteal fossa  PLAN: -Discussed pt labwork today, 06/15/20; mild neutropenia, other blood counts and chemistries are stable. -Discussed 06/15/2020 MMP & K/L light chains reviewed -The pt has no prohibitive toxicities from continuing Carflizomib maintenance q2weeks at this time. -Will hold dose escalation due to concern for worsening neutropenia. Will continue Carflizomib at 56 mg/m^2.  -Discussed CDC guidelines regarding COVID19 booster. Plan to give in clinic in 1 week. -Recommended that the pt continue to eat well, drink at least 48-64 oz of water each day, and walk 20-30 minutes each day.  -Recommend pt f/u for her Colonoscopy & Mammogram as scheduled -Continue Vitamin G25 & Folic acid daily -Continue weekly  Ergocalciferol  -Continue Zometa q91month -Will see back in 2 months with labs   FOLLOW UP: Please schedule next 3 cycles (6 doses) Of Carfilzomib  Portflush and labs with each appointment MD visit in 8 weeks Plz schedule zometa q12weeks - 3 doses Plz schedule covid booster in 1 week   The total time spent in the appt was 30 minutes and more than 50% was on counseling and direct patient cares,ordering and mx of chemotherapy  All of the patient's questions were answered with apparent satisfaction. The patient knows to call the clinic with any problems, questions or concerns.    GSullivan LoneMD MArlingtonAAHIVMS SPacific [facility] LPCRiverland Medical CenterHematology/Oncology Physician [facility] (Office):       [phone](Work cell):  [phone](Fax):           [phone] I, JYevette Edwards am acting as a scribe for Dr. GSullivan Lone   .I have reviewed the above documentation for  accuracy and completeness, and I agree with the above. Brunetta Genera MD

## 2020-06-15 NOTE — Patient Instructions (Signed)
[facility] Discharge Instructions for Patients Receiving Chemotherapy  Today you received the following chemotherapy agents:  Kyprolis  To help prevent nausea and vomiting after your treatment, we encourage you to take your nausea medication as directed.   If you develop nausea and vomiting that is not controlled by your nausea medication, call the clinic.   BELOW ARE SYMPTOMS THAT SHOULD BE REPORTED IMMEDIATELY:  *FEVER GREATER THAN 100.5 F  *CHILLS WITH OR WITHOUT FEVER  NAUSEA AND VOMITING THAT IS NOT CONTROLLED WITH YOUR NAUSEA MEDICATION  *UNUSUAL SHORTNESS OF BREATH  *UNUSUAL BRUISING OR BLEEDING  TENDERNESS IN MOUTH AND THROAT WITH OR WITHOUT PRESENCE OF ULCERS  *URINARY PROBLEMS  *BOWEL PROBLEMS  UNUSUAL RASH Items with * indicate a potential emergency and should be followed up as soon as possible.  Feel free to call the clinic should you have any questions or concerns. The clinic phone number is [phone].  Please show the CHEMO ALERT CARD at check-in to the Emergency Department and triage nurse.   

## 2020-06-15 NOTE — Progress Notes (Signed)
Verbal order per Dr. Irene Limbo: Madaline Brilliant to receive Carfilzomib infusion today with ANC 1.4

## 2020-06-16 LAB — KAPPA/LAMBDA LIGHT CHAINS
Kappa free light chain: 9 mg/L (ref 3.3–19.4)
Kappa, lambda light chain ratio: 2.09 — ABNORMAL HIGH (ref 0.26–1.65)
Lambda free light chains: 4.3 mg/L — ABNORMAL LOW (ref 5.7–26.3)

## 2020-06-17 LAB — MULTIPLE MYELOMA PANEL, SERUM
Albumin SerPl Elph-Mcnc: 3.7 g/dL (ref 2.9–4.4)
Albumin/Glob SerPl: 1.6 (ref 0.7–1.7)
Alpha 1: 0.2 g/dL (ref 0.0–0.4)
Alpha2 Glob SerPl Elph-Mcnc: 0.6 g/dL (ref 0.4–1.0)
B-Globulin SerPl Elph-Mcnc: 1 g/dL (ref 0.7–1.3)
Gamma Glob SerPl Elph-Mcnc: 0.5 g/dL (ref 0.4–1.8)
Globulin, Total: 2.4 g/dL (ref 2.2–3.9)
IgA: 13 mg/dL — ABNORMAL LOW (ref 87–352)
IgG (Immunoglobin G), Serum: 580 mg/dL — ABNORMAL LOW (ref 586–1602)
IgM (Immunoglobulin M), Srm: 38 mg/dL (ref 26–217)
Total Protein ELP: 6.1 g/dL (ref 6.0–8.5)

## 2020-06-23 ENCOUNTER — Other Ambulatory Visit: Payer: Self-pay

## 2020-06-23 ENCOUNTER — Inpatient Hospital Stay: Payer: Medicare Other

## 2020-06-23 DIAGNOSIS — Z23 Encounter for immunization: Secondary | ICD-10-CM

## 2020-06-23 NOTE — Progress Notes (Signed)
° °  Covid-19 Vaccination Clinic  Name:  Frances Ambrosino    MRN: [id number] DOB: 1951-03-28  06/23/2020  Ms. Zuleta was observed post Covid-19 immunization for 15 minutes without incident. She was provided with Vaccine Information Sheet and instruction to access the V-Safe system.   Ms. Prindle was instructed to call 911 with any severe reactions post vaccine:  Difficulty breathing   Swelling of face and throat   A fast heartbeat   A bad rash all over body   Dizziness and weakness

## 2020-06-28 ENCOUNTER — Ambulatory Visit: Payer: Medicare Other

## 2020-06-29 ENCOUNTER — Inpatient Hospital Stay: Payer: Medicare Other

## 2020-06-29 ENCOUNTER — Other Ambulatory Visit: Payer: Self-pay

## 2020-06-29 VITALS — BP 140/75 | HR 75 | Temp 98.6°F | Resp 17

## 2020-06-29 DIAGNOSIS — Z5112 Encounter for antineoplastic immunotherapy: Secondary | ICD-10-CM | POA: Diagnosis not present

## 2020-06-29 DIAGNOSIS — C9000 Multiple myeloma not having achieved remission: Secondary | ICD-10-CM | POA: Diagnosis not present

## 2020-06-29 DIAGNOSIS — D709 Neutropenia, unspecified: Secondary | ICD-10-CM | POA: Diagnosis not present

## 2020-06-29 DIAGNOSIS — Z7189 Other specified counseling: Secondary | ICD-10-CM

## 2020-06-29 DIAGNOSIS — Z5111 Encounter for antineoplastic chemotherapy: Secondary | ICD-10-CM

## 2020-06-29 DIAGNOSIS — Z23 Encounter for immunization: Secondary | ICD-10-CM | POA: Diagnosis not present

## 2020-06-29 DIAGNOSIS — Z86718 Personal history of other venous thrombosis and embolism: Secondary | ICD-10-CM | POA: Diagnosis not present

## 2020-06-29 DIAGNOSIS — M8580 Other specified disorders of bone density and structure, unspecified site: Secondary | ICD-10-CM | POA: Diagnosis not present

## 2020-06-29 DIAGNOSIS — D702 Other drug-induced agranulocytosis: Secondary | ICD-10-CM

## 2020-06-29 LAB — CBC WITH DIFFERENTIAL/PLATELET
Abs Immature Granulocytes: 0 10*3/uL (ref 0.00–0.07)
Basophils Absolute: 0 10*3/uL (ref 0.0–0.1)
Basophils Relative: 1 %
Eosinophils Absolute: 0.2 10*3/uL (ref 0.0–0.5)
Eosinophils Relative: 4 %
HCT: 31.2 % — ABNORMAL LOW (ref 36.0–46.0)
Hemoglobin: 10.1 g/dL — ABNORMAL LOW (ref 12.0–15.0)
Immature Granulocytes: 0 %
Lymphocytes Relative: 43 %
Lymphs Abs: 1.6 10*3/uL (ref 0.7–4.0)
MCH: 31.3 pg (ref 26.0–34.0)
MCHC: 32.4 g/dL (ref 30.0–36.0)
MCV: 96.6 fL (ref 80.0–100.0)
Monocytes Absolute: 0.4 10*3/uL (ref 0.1–1.0)
Monocytes Relative: 12 %
Neutro Abs: 1.5 10*3/uL — ABNORMAL LOW (ref 1.7–7.7)
Neutrophils Relative %: 40 %
Platelets: 172 10*3/uL (ref 150–400)
RBC: 3.23 MIL/uL — ABNORMAL LOW (ref 3.87–5.11)
RDW: 14.6 % (ref 11.5–15.5)
WBC: 3.7 10*3/uL — ABNORMAL LOW (ref 4.0–10.5)
nRBC: 0 % (ref 0.0–0.2)

## 2020-06-29 LAB — CMP (CANCER CENTER ONLY)
ALT: 17 U/L (ref 0–44)
AST: 18 U/L (ref 15–41)
Albumin: 3.7 g/dL (ref 3.5–5.0)
Alkaline Phosphatase: 83 U/L (ref 38–126)
Anion gap: 6 (ref 5–15)
BUN: 12 mg/dL (ref 8–23)
CO2: 23 mmol/L (ref 22–32)
Calcium: 8.5 mg/dL — ABNORMAL LOW (ref 8.9–10.3)
Chloride: 112 mmol/L — ABNORMAL HIGH (ref 98–111)
Creatinine: 0.76 mg/dL (ref 0.44–1.00)
GFR, Est AFR Am: >60 mL/min (ref >60)
GFR, Estimated: >60 mL/min (ref >60)
Glucose, Bld: 78 mg/dL (ref 70–99)
Potassium: 4.2 mmol/L (ref 3.5–5.1)
Sodium: 141 mmol/L (ref 135–145)
Total Bilirubin: 0.7 mg/dL (ref 0.3–1.2)
Total Protein: 6.3 g/dL — ABNORMAL LOW (ref 6.5–8.1)

## 2020-06-29 MED ORDER — SODIUM CHLORIDE 0.9 % IV SOLN
Freq: Once | INTRAVENOUS | Status: DC
  Filled 2020-06-29: qty 250

## 2020-06-29 MED ORDER — PROCHLORPERAZINE MALEATE 10 MG PO TABS
10.0000 mg | ORAL_TABLET | Freq: Once | ORAL | Status: AC
  Administered 2020-06-29: 10 mg via ORAL

## 2020-06-29 MED ORDER — DEXTROSE 5 % IV SOLN
56.0000 mg/m2 | Freq: Once | INTRAVENOUS | Status: AC
  Administered 2020-06-29: 120 mg via INTRAVENOUS
  Filled 2020-06-29: qty 60

## 2020-06-29 MED ORDER — SODIUM CHLORIDE 0.9 % IV SOLN
20.0000 mg | Freq: Once | INTRAVENOUS | Status: AC
  Administered 2020-06-29: 20 mg via INTRAVENOUS
  Filled 2020-06-29: qty 20

## 2020-06-29 MED ORDER — SODIUM CHLORIDE 0.9% FLUSH
10.0000 mL | INTRAVENOUS | Status: DC | PRN
  Administered 2020-06-29: 10 mL
  Filled 2020-06-29: qty 10

## 2020-06-29 MED ORDER — ACETAMINOPHEN 500 MG PO TABS
1000.0000 mg | ORAL_TABLET | Freq: Once | ORAL | Status: AC
  Administered 2020-06-29: 1000 mg via ORAL

## 2020-06-29 MED ORDER — SODIUM CHLORIDE 0.9 % IV SOLN
Freq: Once | INTRAVENOUS | Status: AC
  Filled 2020-06-29: qty 250

## 2020-06-29 MED ORDER — ACETAMINOPHEN 500 MG PO TABS
ORAL_TABLET | ORAL | Status: AC
  Filled 2020-06-29: qty 2

## 2020-06-29 MED ORDER — PROCHLORPERAZINE MALEATE 10 MG PO TABS
ORAL_TABLET | ORAL | Status: AC
  Filled 2020-06-29: qty 1

## 2020-06-29 MED ORDER — HEPARIN SOD (PORK) LOCK FLUSH 100 UNIT/ML IV SOLN
500.0000 [IU] | Freq: Once | INTRAVENOUS | Status: AC | PRN
  Administered 2020-06-29: 500 [IU]
  Filled 2020-06-29: qty 5

## 2020-06-29 NOTE — Patient Instructions (Signed)
[facility] Discharge Instructions for Patients Receiving Chemotherapy  Today you received the following chemotherapy agents:  Kyprolis  To help prevent nausea and vomiting after your treatment, we encourage you to take your nausea medication as directed.   If you develop nausea and vomiting that is not controlled by your nausea medication, call the clinic.   BELOW ARE SYMPTOMS THAT SHOULD BE REPORTED IMMEDIATELY:  *FEVER GREATER THAN 100.5 F  *CHILLS WITH OR WITHOUT FEVER  NAUSEA AND VOMITING THAT IS NOT CONTROLLED WITH YOUR NAUSEA MEDICATION  *UNUSUAL SHORTNESS OF BREATH  *UNUSUAL BRUISING OR BLEEDING  TENDERNESS IN MOUTH AND THROAT WITH OR WITHOUT PRESENCE OF ULCERS  *URINARY PROBLEMS  *BOWEL PROBLEMS  UNUSUAL RASH Items with * indicate a potential emergency and should be followed up as soon as possible.  Feel free to call the clinic should you have any questions or concerns. The clinic phone number is [phone].  Please show the CHEMO ALERT CARD at check-in to the Emergency Department and triage nurse.   

## 2020-06-29 NOTE — Progress Notes (Signed)
Pt asked her infusion RN today if ok for her to take [employer] Plus supplement while on chemo. Armandina Gemma Revive Plus is an herbal supplement that contains biotin, Magnesium, chromium, banaba leaf, guggul, bitter melon, licorice root, cinnamon bark, yarrow flowers, juniper berry, white mulberry, vanadium and L-taurine. I have run a drug interaction check w/ this and her current meds.  There are no significant interactions.  There is a warning that when combined w/ dexamethasone, there could be a prolonged duration of action & increased blood levels of corticosteroids.   In addition, this supplement may lessen the effect of ondansetron.  If pt must take the supplement, I advised that she take it only on her weeks off from her chemo tx. I have informed Dr. Irene Limbo.  Kennith Center, Pharm.D., CPP 06/29/2020@1 :20 PM

## 2020-06-29 NOTE — Patient Instructions (Signed)

## 2020-07-06 ENCOUNTER — Telehealth: Payer: Self-pay | Admitting: *Deleted

## 2020-07-06 NOTE — Telephone Encounter (Signed)
-----   Message from Brunetta Genera, MD sent at 07/01/2020  2:31 PM EDT ----- Regarding: RE: Armandina Gemma Revive supplement I would recommend she hold off on the supplement. Thx GK ----- Message ----- From: Tora Kindred, RPH Sent: 06/29/2020   1:20 PM EDT To: Brunetta Genera, MD Subject: Onnie Graham supplement                       Pt asked her infusion RN today if ok for her to take [employer] Plus supplement while on chemo. Armandina Gemma Revive Plus is an herbal supplement that contains biotin, Magnesium, chromium, banaba leaf, guggul, bitter melon, licorice root, cinnamon bark, yarrow flowers, juniper berry, white mulberry, vanadium and L-taurine. I have run a drug interaction check w/ this and her current meds.  There are no significant interactions.  There is a warning that when combined w/ dexamethasone, there could be a prolonged duration of action & increased blood levels of corticosteroids.   In addition, this supplement may lessen the effect of ondansetron.  If pt must take the supplement, I advised that she take it only on her weeks off from her chemo tx  Thanks, Vergia Alcon

## 2020-07-06 NOTE — Telephone Encounter (Signed)
Contacted patient with Dr. Grier Mitts recommendation that she not take this supplement Armandina Gemma Revive) at this time based on pharmacist information.  Patient verbalized understanding and stated she will not continue it.

## 2020-07-10 ENCOUNTER — Other Ambulatory Visit: Payer: Self-pay | Admitting: Hematology

## 2020-07-13 ENCOUNTER — Inpatient Hospital Stay: Payer: Medicare Other

## 2020-07-13 ENCOUNTER — Other Ambulatory Visit: Payer: Self-pay | Admitting: Hematology

## 2020-07-13 ENCOUNTER — Other Ambulatory Visit: Payer: Self-pay

## 2020-07-13 VITALS — BP 125/80 | HR 89 | Resp 16 | Ht 63.0 in | Wt 190.2 lb

## 2020-07-13 DIAGNOSIS — Z5112 Encounter for antineoplastic immunotherapy: Secondary | ICD-10-CM | POA: Diagnosis not present

## 2020-07-13 DIAGNOSIS — Z23 Encounter for immunization: Secondary | ICD-10-CM | POA: Diagnosis not present

## 2020-07-13 DIAGNOSIS — Z7189 Other specified counseling: Secondary | ICD-10-CM

## 2020-07-13 DIAGNOSIS — Z95828 Presence of other vascular implants and grafts: Secondary | ICD-10-CM

## 2020-07-13 DIAGNOSIS — D709 Neutropenia, unspecified: Secondary | ICD-10-CM | POA: Diagnosis not present

## 2020-07-13 DIAGNOSIS — C9000 Multiple myeloma not having achieved remission: Secondary | ICD-10-CM

## 2020-07-13 DIAGNOSIS — Z86718 Personal history of other venous thrombosis and embolism: Secondary | ICD-10-CM | POA: Diagnosis not present

## 2020-07-13 DIAGNOSIS — Z5111 Encounter for antineoplastic chemotherapy: Secondary | ICD-10-CM

## 2020-07-13 DIAGNOSIS — M8580 Other specified disorders of bone density and structure, unspecified site: Secondary | ICD-10-CM | POA: Diagnosis not present

## 2020-07-13 LAB — CMP (CANCER CENTER ONLY)
ALT: 17 U/L (ref 0–44)
AST: 17 U/L (ref 15–41)
Albumin: 3.6 g/dL (ref 3.5–5.0)
Alkaline Phosphatase: 77 U/L (ref 38–126)
Anion gap: 8 (ref 5–15)
BUN: 11 mg/dL (ref 8–23)
CO2: 23 mmol/L (ref 22–32)
Calcium: 8.9 mg/dL (ref 8.9–10.3)
Chloride: 109 mmol/L (ref 98–111)
Creatinine: 0.75 mg/dL (ref 0.44–1.00)
GFR, Est AFR Am: >60 mL/min (ref >60)
GFR, Estimated: >60 mL/min (ref >60)
Glucose, Bld: 89 mg/dL (ref 70–99)
Potassium: 3.8 mmol/L (ref 3.5–5.1)
Sodium: 140 mmol/L (ref 135–145)
Total Bilirubin: 0.8 mg/dL (ref 0.3–1.2)
Total Protein: 6.6 g/dL (ref 6.5–8.1)

## 2020-07-13 LAB — CBC WITH DIFFERENTIAL/PLATELET
Abs Immature Granulocytes: 0.01 10*3/uL (ref 0.00–0.07)
Basophils Absolute: 0 10*3/uL (ref 0.0–0.1)
Basophils Relative: 1 %
Eosinophils Absolute: 0.2 10*3/uL (ref 0.0–0.5)
Eosinophils Relative: 6 %
HCT: 31.2 % — ABNORMAL LOW (ref 36.0–46.0)
Hemoglobin: 10.2 g/dL — ABNORMAL LOW (ref 12.0–15.0)
Immature Granulocytes: 0 %
Lymphocytes Relative: 37 %
Lymphs Abs: 1.5 10*3/uL (ref 0.7–4.0)
MCH: 30.4 pg (ref 26.0–34.0)
MCHC: 32.7 g/dL (ref 30.0–36.0)
MCV: 93.1 fL (ref 80.0–100.0)
Monocytes Absolute: 0.5 10*3/uL (ref 0.1–1.0)
Monocytes Relative: 12 %
Neutro Abs: 1.7 10*3/uL (ref 1.7–7.7)
Neutrophils Relative %: 44 %
Platelets: 220 10*3/uL (ref 150–400)
RBC: 3.35 MIL/uL — ABNORMAL LOW (ref 3.87–5.11)
RDW: 14.4 % (ref 11.5–15.5)
WBC: 3.9 10*3/uL — ABNORMAL LOW (ref 4.0–10.5)
nRBC: 0 % (ref 0.0–0.2)

## 2020-07-13 MED ORDER — SODIUM CHLORIDE 0.9 % IV SOLN
Freq: Once | INTRAVENOUS | Status: AC
  Filled 2020-07-13: qty 250

## 2020-07-13 MED ORDER — PROCHLORPERAZINE MALEATE 10 MG PO TABS
10.0000 mg | ORAL_TABLET | Freq: Once | ORAL | Status: AC
  Administered 2020-07-13: 10 mg via ORAL

## 2020-07-13 MED ORDER — HEPARIN SOD (PORK) LOCK FLUSH 100 UNIT/ML IV SOLN
500.0000 [IU] | Freq: Once | INTRAVENOUS | Status: AC | PRN
  Administered 2020-07-13: 500 [IU]
  Filled 2020-07-13: qty 5

## 2020-07-13 MED ORDER — ACETAMINOPHEN 500 MG PO TABS
ORAL_TABLET | ORAL | Status: AC
  Filled 2020-07-13: qty 2

## 2020-07-13 MED ORDER — SODIUM CHLORIDE 0.9% FLUSH
10.0000 mL | Freq: Once | INTRAVENOUS | Status: AC
  Administered 2020-07-13: 10 mL
  Filled 2020-07-13: qty 10

## 2020-07-13 MED ORDER — SODIUM CHLORIDE 0.9% FLUSH
10.0000 mL | INTRAVENOUS | Status: DC | PRN
  Administered 2020-07-13: 10 mL
  Filled 2020-07-13: qty 10

## 2020-07-13 MED ORDER — ACETAMINOPHEN 500 MG PO TABS
1000.0000 mg | ORAL_TABLET | Freq: Once | ORAL | Status: AC
  Administered 2020-07-13: 1000 mg via ORAL

## 2020-07-13 MED ORDER — SODIUM CHLORIDE 0.9 % IV SOLN
20.0000 mg | Freq: Once | INTRAVENOUS | Status: AC
  Administered 2020-07-13: 20 mg via INTRAVENOUS
  Filled 2020-07-13: qty 20

## 2020-07-13 MED ORDER — PROCHLORPERAZINE MALEATE 10 MG PO TABS
ORAL_TABLET | ORAL | Status: AC
  Filled 2020-07-13: qty 1

## 2020-07-13 MED ORDER — DEXTROSE 5 % IV SOLN
56.0000 mg/m2 | Freq: Once | INTRAVENOUS | Status: AC
  Administered 2020-07-13: 120 mg via INTRAVENOUS
  Filled 2020-07-13: qty 60

## 2020-07-13 NOTE — Patient Instructions (Addendum)
[facility] Discharge Instructions for Patients Receiving Chemotherapy  Today you received the following chemotherapy agents:  Kyprolis  To help prevent nausea and vomiting after your treatment, we encourage you to take your nausea medication as prescribed.   If you develop nausea and vomiting that is not controlled by your nausea medication, call the clinic.   BELOW ARE SYMPTOMS THAT SHOULD BE REPORTED IMMEDIATELY:  *FEVER GREATER THAN 100.5 F  *CHILLS WITH OR WITHOUT FEVER  NAUSEA AND VOMITING THAT IS NOT CONTROLLED WITH YOUR NAUSEA MEDICATION  *UNUSUAL SHORTNESS OF BREATH  *UNUSUAL BRUISING OR BLEEDING  TENDERNESS IN MOUTH AND THROAT WITH OR WITHOUT PRESENCE OF ULCERS  *URINARY PROBLEMS  *BOWEL PROBLEMS  UNUSUAL RASH Items with * indicate a potential emergency and should be followed up as soon as possible.  Feel free to call the clinic should you have any questions or concerns. The clinic phone number is [phone].  Please show the CHEMO ALERT CARD at check-in to the Emergency Department and triage nurse.   

## 2020-07-13 NOTE — Patient Instructions (Signed)

## 2020-07-19 ENCOUNTER — Other Ambulatory Visit (INDEPENDENT_AMBULATORY_CARE_PROVIDER_SITE_OTHER): Payer: Self-pay

## 2020-07-19 DIAGNOSIS — Z1211 Encounter for screening for malignant neoplasm of colon: Secondary | ICD-10-CM

## 2020-07-25 ENCOUNTER — Encounter (INDEPENDENT_AMBULATORY_CARE_PROVIDER_SITE_OTHER): Payer: Self-pay

## 2020-07-25 ENCOUNTER — Telehealth (INDEPENDENT_AMBULATORY_CARE_PROVIDER_SITE_OTHER): Payer: Self-pay

## 2020-07-25 DIAGNOSIS — Z1211 Encounter for screening for malignant neoplasm of colon: Secondary | ICD-10-CM

## 2020-07-25 MED ORDER — PLENVU 140 G PO SOLR: 1.0000 | Freq: Once | ORAL | 0 refills | Status: AC

## 2020-07-25 NOTE — Telephone Encounter (Signed)
Patient has Plenvu (copay card) 

## 2020-07-26 ENCOUNTER — Other Ambulatory Visit (INDEPENDENT_AMBULATORY_CARE_PROVIDER_SITE_OTHER): Payer: Self-pay | Admitting: *Deleted

## 2020-07-26 ENCOUNTER — Telehealth (INDEPENDENT_AMBULATORY_CARE_PROVIDER_SITE_OTHER): Payer: Self-pay

## 2020-07-26 NOTE — Telephone Encounter (Signed)
Referring MD/PCP: [place] Crane   Procedure: Tcs  Reason/Indication:  Screening  Has patient had this procedure before?  Yes, 2011  If so, when, by whom and where?    Is there a family history of colon cancer?  no  Who?  What age when diagnosed?    Is patient diabetic?   no      Does patient have prosthetic heart valve or mechanical valve?  no  Do you have a pacemaker/defibrillator?  no  Has patient ever had endocarditis/atrial fibrillation? no  Does patient use oxygen? no  Has patient had joint replacement within last 12 months?  no  Is patient constipated or do they take laxatives? no  Does patient have a history of alcohol/drug use?  no  Is patient on blood thinner such as Coumadin, Plavix and/or Aspirin? no  Medications: Acyclovir 800mg  daily, folic acid daily, vit Q96 daily, vit d daily, calcium daily  Allergies: codeine  Medication Adjustment per Dr Rehman/Dr Jenetta Downer none  Procedure date & time: 08/23/20 at 9:00 am

## 2020-07-26 NOTE — Telephone Encounter (Signed)
Ok to schedule.  Thanks,  Moksha Dorgan Castaneda Mayorga, MD Gastroenterology and Hepatology  [facility]  

## 2020-07-27 ENCOUNTER — Inpatient Hospital Stay: Payer: Medicare Other

## 2020-07-27 ENCOUNTER — Inpatient Hospital Stay: Payer: Medicare Other | Attending: Hematology

## 2020-07-27 ENCOUNTER — Other Ambulatory Visit: Payer: Self-pay

## 2020-07-27 VITALS — BP 139/87 | HR 82 | Temp 98.5°F | Resp 18 | Wt 190.2 lb

## 2020-07-27 DIAGNOSIS — C9000 Multiple myeloma not having achieved remission: Secondary | ICD-10-CM

## 2020-07-27 DIAGNOSIS — G629 Polyneuropathy, unspecified: Secondary | ICD-10-CM | POA: Insufficient documentation

## 2020-07-27 DIAGNOSIS — D649 Anemia, unspecified: Secondary | ICD-10-CM | POA: Insufficient documentation

## 2020-07-27 DIAGNOSIS — Z95828 Presence of other vascular implants and grafts: Secondary | ICD-10-CM

## 2020-07-27 DIAGNOSIS — D702 Other drug-induced agranulocytosis: Secondary | ICD-10-CM

## 2020-07-27 DIAGNOSIS — Z5112 Encounter for antineoplastic immunotherapy: Secondary | ICD-10-CM | POA: Insufficient documentation

## 2020-07-27 DIAGNOSIS — Z23 Encounter for immunization: Secondary | ICD-10-CM | POA: Insufficient documentation

## 2020-07-27 DIAGNOSIS — Z86718 Personal history of other venous thrombosis and embolism: Secondary | ICD-10-CM | POA: Diagnosis not present

## 2020-07-27 DIAGNOSIS — Z7189 Other specified counseling: Secondary | ICD-10-CM

## 2020-07-27 DIAGNOSIS — Z5111 Encounter for antineoplastic chemotherapy: Secondary | ICD-10-CM

## 2020-07-27 LAB — CBC WITH DIFFERENTIAL/PLATELET
Abs Immature Granulocytes: 0.01 10*3/uL (ref 0.00–0.07)
Basophils Absolute: 0 10*3/uL (ref 0.0–0.1)
Basophils Relative: 1 %
Eosinophils Absolute: 0.3 10*3/uL (ref 0.0–0.5)
Eosinophils Relative: 8 %
HCT: 31.8 % — ABNORMAL LOW (ref 36.0–46.0)
Hemoglobin: 10.3 g/dL — ABNORMAL LOW (ref 12.0–15.0)
Immature Granulocytes: 0 %
Lymphocytes Relative: 38 %
Lymphs Abs: 1.5 10*3/uL (ref 0.7–4.0)
MCH: 30.1 pg (ref 26.0–34.0)
MCHC: 32.4 g/dL (ref 30.0–36.0)
MCV: 93 fL (ref 80.0–100.0)
Monocytes Absolute: 0.4 10*3/uL (ref 0.1–1.0)
Monocytes Relative: 10 %
Neutro Abs: 1.6 10*3/uL — ABNORMAL LOW (ref 1.7–7.7)
Neutrophils Relative %: 43 %
Platelets: 210 10*3/uL (ref 150–400)
RBC: 3.42 MIL/uL — ABNORMAL LOW (ref 3.87–5.11)
RDW: 14.7 % (ref 11.5–15.5)
WBC: 3.9 10*3/uL — ABNORMAL LOW (ref 4.0–10.5)
nRBC: 0 % (ref 0.0–0.2)

## 2020-07-27 LAB — CMP (CANCER CENTER ONLY)
ALT: 14 U/L (ref 0–44)
AST: 17 U/L (ref 15–41)
Albumin: 3.5 g/dL (ref 3.5–5.0)
Alkaline Phosphatase: 84 U/L (ref 38–126)
Anion gap: 3 — ABNORMAL LOW (ref 5–15)
BUN: 13 mg/dL (ref 8–23)
CO2: 26 mmol/L (ref 22–32)
Calcium: 9.3 mg/dL (ref 8.9–10.3)
Chloride: 111 mmol/L (ref 98–111)
Creatinine: 0.77 mg/dL (ref 0.44–1.00)
GFR, Estimated: >60 mL/min (ref >60)
Glucose, Bld: 90 mg/dL (ref 70–99)
Potassium: 3.9 mmol/L (ref 3.5–5.1)
Sodium: 140 mmol/L (ref 135–145)
Total Bilirubin: 0.8 mg/dL (ref 0.3–1.2)
Total Protein: 6.4 g/dL — ABNORMAL LOW (ref 6.5–8.1)

## 2020-07-27 MED ORDER — SODIUM CHLORIDE 0.9 % IV SOLN
Freq: Once | INTRAVENOUS | Status: AC
  Filled 2020-07-27: qty 250

## 2020-07-27 MED ORDER — SODIUM CHLORIDE 0.9 % IV SOLN
20.0000 mg | Freq: Once | INTRAVENOUS | Status: AC
  Administered 2020-07-27: 20 mg via INTRAVENOUS
  Filled 2020-07-27: qty 20

## 2020-07-27 MED ORDER — ACETAMINOPHEN 500 MG PO TABS
ORAL_TABLET | ORAL | Status: AC
  Filled 2020-07-27: qty 2

## 2020-07-27 MED ORDER — PROCHLORPERAZINE MALEATE 10 MG PO TABS
10.0000 mg | ORAL_TABLET | Freq: Once | ORAL | Status: AC
  Administered 2020-07-27: 10 mg via ORAL

## 2020-07-27 MED ORDER — PROCHLORPERAZINE MALEATE 10 MG PO TABS
ORAL_TABLET | ORAL | Status: AC
  Filled 2020-07-27: qty 1

## 2020-07-27 MED ORDER — DEXTROSE 5 % IV SOLN
56.0000 mg/m2 | Freq: Once | INTRAVENOUS | Status: AC
  Administered 2020-07-27: 120 mg via INTRAVENOUS
  Filled 2020-07-27: qty 60

## 2020-07-27 MED ORDER — SODIUM CHLORIDE 0.9% FLUSH
10.0000 mL | INTRAVENOUS | Status: DC | PRN
  Administered 2020-07-27: 10 mL
  Filled 2020-07-27: qty 10

## 2020-07-27 MED ORDER — HEPARIN SOD (PORK) LOCK FLUSH 100 UNIT/ML IV SOLN
500.0000 [IU] | Freq: Once | INTRAVENOUS | Status: AC | PRN
  Administered 2020-07-27: 500 [IU]
  Filled 2020-07-27: qty 5

## 2020-07-27 MED ORDER — ACETAMINOPHEN 500 MG PO TABS
1000.0000 mg | ORAL_TABLET | Freq: Once | ORAL | Status: AC
  Administered 2020-07-27: 1000 mg via ORAL

## 2020-07-27 MED ORDER — SODIUM CHLORIDE 0.9% FLUSH
10.0000 mL | Freq: Once | INTRAVENOUS | Status: AC
  Administered 2020-07-27: 10 mL
  Filled 2020-07-27: qty 10

## 2020-07-27 NOTE — Patient Instructions (Signed)
[facility] Discharge Instructions for Patients Receiving Chemotherapy  Today you received the following chemotherapy agents: carfilzomib.  To help prevent nausea and vomiting after your treatment, we encourage you to take your nausea medication as directed.   If you develop nausea and vomiting that is not controlled by your nausea medication, call the clinic.   BELOW ARE SYMPTOMS THAT SHOULD BE REPORTED IMMEDIATELY:  *FEVER GREATER THAN 100.5 F  *CHILLS WITH OR WITHOUT FEVER  NAUSEA AND VOMITING THAT IS NOT CONTROLLED WITH YOUR NAUSEA MEDICATION  *UNUSUAL SHORTNESS OF BREATH  *UNUSUAL BRUISING OR BLEEDING  TENDERNESS IN MOUTH AND THROAT WITH OR WITHOUT PRESENCE OF ULCERS  *URINARY PROBLEMS  *BOWEL PROBLEMS  UNUSUAL RASH Items with * indicate a potential emergency and should be followed up as soon as possible.  Feel free to call the clinic should you have any questions or concerns. The clinic phone number is [phone].  Please show the CHEMO ALERT CARD at check-in to the Emergency Department and triage nurse.   

## 2020-07-27 NOTE — Patient Instructions (Signed)

## 2020-08-07 NOTE — Progress Notes (Signed)
HEMATOLOGY/ONCOLOGY CLINIC NOTE  Date of Service: 08/07/20     PCP: Marzetta Board MD  CHIEF COMPLAINTS/PURPOSE OF CONSULTATION:  Continued f/u for mx of myeloma   HISTORY OF PRESENTING ILLNESS:   Madison Parker is a wonderful 69 y.o. 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  Patient has a history of smoldering multiple myeloma which she reports she was diagnosed with in 2011 when she was evaluated by a hematologist in [place]. She reports that she presented with low blood counts (low WBC)   and after significant workup she had a bone marrow examination based on which she was told that she has smoldering multiple myeloma. Patient notes that she had a bone survey at that time which was negative. She was monitored there closely and then moved to [facility] in 2015 to stay with her son whose wife was being treated for breast cancer. Patient was following with Dr. Delight Hoh in [place]. She reports not having had any treatment for multiple myeloma.  Her last labs from about a year ago showed a SPEP with no OBSERVED M spike . Serum free light chains showed an elevation of kappa Free light chain 310 and lambda free light chain of 12.7 with an abnormal Kappa/ Lambda ratio of 24.38 ( up from 19 previously). Random urine showed an M protein complement of 44%.  She lives in [place] and requested transfer of care to [place]. She was offered follow-up but [facility] in [place]  But prefers to  follow [place] in [place]. Patient reports her energy levels been stable. She has chronic back pain which has improved since the patient had her spinal surgery in April 2017 (L4-L5 interbody fusion). Patient notes she has had some chronic left hip pain which is somewhat more bothersome with the navy on the lateral aspect of her upper thighs that's painful. She also notes some right hip pain.  Over the last few weeks he notes some upper neck pain especially when bending her neck backwards and tingling numbness in her right upper extremity.  Has been working on losing some weight voluntarily.  No acute other new symptoms.  AUTOLOGOUS STEM CELL TRANSPLANT:  Disease status at time of transplant: sCR MRD positive  ASBMT risk stratification: low risk, ECOG/KPS: 0 / 90%, HSCT-CI score: 0  Started mobilization on 09/03/19 using GCS-F and Plerixafor. She collected stem cells on 09/07/19. The pre-processing total was 7.18x10(6) CD34+ cells/kg. The post-processing total was 1.11x10(7) CD34+ cells/kg frozen in 3 bags.  Preparative regimen with Melphalan 200 mg/m2 on 09/14/19 (outpatient).  Infusion of stem cells - 7.4 x 10(6) on 09/15/19 (outpatient)  Admitted Day +8 for neutropenic fever, uncontrolled n/v, diarrhea  Treated with short course of prednisone for peri-engraftment syndrome  TREATMENT:  Started induction therapy with carfilozomib, lenlaidomide, and dexamethasone 28 day cycle on 04/27/2019. She had a great response to initial cycle. She developed a DVT on 06/24/19 and was started on apixaban as an anticoagulant. She had normalization of her light chains by the start of C4 on 07/28/19.  She was referred to the myeloma clinic at [facility] for BMT consult and seen on 08/10/19. Serology from her visit showed no M-spike by SPEP, a free light chain ratio of 1.07 (kappa 11.42, lambda 10.65), and an abnormal beta-2-micro of 3.02. She was started on pre-transplant evaluation while undergoing C5 of therapy.  Treatment discontinued in 07/2019 in  preparation for autologous stem cell transplant.   INTERVAL HISTORY:   Madison Parker returns today for f/u of her multiple myeloma. She is s/p HDT -Auto HSCT on 09/15/2019. She is here for maintenance Carfilzomib. The patient's last visit with [place] was on 06/15/2020. The pt reports that she is doing well overall.  The pt reports no acute new symptoms.   No new bone pains.  No fevers no chills no night sweats.  No new toxicities from her current maintenance carfilzomib. Grade 1 fatigue..  Lab results today (08/07/20) of CBC w/diff and CMP - reviewed with patient   MEDICAL HISTORY:  Past Medical History:  Diagnosis Date  . Headache   . Low back pain   . SBO (small bowel obstruction) (HCC) 2010  . Smoldering multiple myeloma (HCC)   Previous history of hypothyroidism- not currently medications Anxiety Obesity .There is no height or weight on file to calculate BMI. Vitamin D deficiency Smoldering multiple myeloma diagnosed in 2011 Lumbosacral radiculopathy Left hip pain  SURGICAL HISTORY: Past Surgical History:  Procedure Laterality Date  . ABDOMINAL HYSTERECTOMY     complete  . BACK SURGERY     lower at [facility]  . colonscopy  2014  . GASTRIC BYPASS  yrs ago  . HERNIA REPAIR    . IR IMAGING GUIDED PORT INSERTION  04/20/2019  . KNEE SURGERY  06/04/2009   both knees replaced   . TONSILLECTOMY  age 80  . TOTAL HIP ARTHROPLASTY Left 08/16/2017   Procedure: LEFT TOTAL HIP ARTHROPLASTY ANTERIOR APPROACH;  Surgeon: Jodi Geralds, MD;  Location: [facility] ORS;  Service: Orthopedics;  Laterality: Left;  Spinal surgery April 2017  SOCIAL HISTORY: Social History   Socioeconomic History  . Marital status: Married    Spouse name: Not on file  . Number of children: Not on file  . Years of education: Not on file  . Highest education level: Not on file  Occupational History  . Not on file  Tobacco Use  . Smoking status: Former Smoker    Packs/day: 0.50    Years: 10.00    Pack years: 5.00    Types: Cigarettes  . Smokeless tobacco: Never Used  . Tobacco comment: quit 1977  Vaping Use  . Vaping Use: Never used  Substance and Sexual Activity  . Alcohol use: Yes    Comment: occ  . Drug use: No  . Sexual activity: Not on file  Other Topics Concern  . Not on file  Social History Narrative  . Not on file   Social Determinants of  Health   Financial Resource Strain:   . Difficulty of Paying Living Expenses: Not on file  Food Insecurity:   . Worried About Programme researcher, broadcasting/film/video in the Last Year: Not on file  . Ran Out of Food in the Last Year: Not on file  Transportation Needs:   . Lack of Transportation (Medical): Not on file  . Lack of Transportation (Non-Medical): Not on file  Physical Activity:   . Days of Exercise per Week: Not on file  . Minutes of Exercise per Session: Not on file  Stress:   . Feeling of Stress : Not on file  Social Connections:   . Frequency of Communication with Friends and Family: Not on file  . Frequency of Social Gatherings with Friends and Family: Not on file  . Attends Religious Services: Not on file  . Active Member of Clubs or Organizations: Not on file  . Attends Club or  Organization Meetings: Not on file  . Marital Status: Not on file  Intimate Partner Violence:   . Fear of Current or Ex-Partner: Not on file  . Emotionally Abused: Not on file  . Physically Abused: Not on file  . Sexually Abused: Not on file  Occasional alcohol use Former smoker and smoked 1 pack per week for about 9 years has since quit.  FAMILY HISTORY: No family history on file.  ALLERGIES:  is allergic to codeine.  MEDICATIONS:  Current Outpatient Medications  Medication Sig Dispense Refill  . acyclovir (ZOVIRAX) 400 MG tablet Take 1 tablet (400 mg total) by mouth 2 (two) times daily. 180 tablet 0  . Calcium Carbonate (CALCIUM 600 PO) Take 1 tablet by mouth daily.    Marland Kitchen FOLIC ACID PO Take 1 tablet by mouth.    . lidocaine-prilocaine (EMLA) cream Apply 1 application topically as needed. 30 g 2  . MULTIPLE VITAMIN PO Take 1 tablet by mouth daily.    . ondansetron (ZOFRAN) 8 MG tablet Take 1 tablet (8 mg total) by mouth 2 (two) times daily as needed (Nausea or vomiting). 30 tablet 1  . prochlorperazine (COMPAZINE) 10 MG tablet Take 1 tablet (10 mg total) by mouth every 6 (six) hours as needed for nausea  or vomiting. 30 tablet 0  . TURMERIC-GINGER PO Take 1 each by mouth daily. Takes 1 per day    . vitamin B-12 (CYANOCOBALAMIN) 1000 MCG tablet Take 5,000 mcg by mouth daily.     . Vitamin D, Ergocalciferol, (DRISDOL) 1.25 MG (50000 UNIT) CAPS capsule TAKE 1 CAPSULE ONCE A WEEK 13 capsule 3   No current facility-administered medications for this visit.   Facility-Administered Medications Ordered in Other Visits  Medication Dose Route Frequency Provider Last Rate Last Admin  . sodium chloride flush (NS) 0.9 % injection 10 mL  10 mL Intracatheter PRN Brunetta Genera, MD   10 mL at 05/11/19 1437    REVIEW OF SYSTEMS:  A 10+ POINT REVIEW OF SYSTEMS WAS OBTAINED including neurology, dermatology, psychiatry, cardiac, respiratory, lymph, extremities, GI, GU, Musculoskeletal, constitutional, breasts, reproductive, HEENT.  All pertinent positives are noted in the HPI.  All others are negative.   PHYSICAL EXAMINATION: ECOG FS:1 - Symptomatic but completely ambulatory  There were no vitals filed for this visit. Wt Readings from Last 3 Encounters:  07/27/20 190 lb 4 oz (86.3 kg)  07/13/20 190 lb 4 oz (86.3 kg)  06/15/20 187 lb 14.4 oz (85.2 kg)   There is no height or weight on file to calculate BMI.    GENERAL:alert, in no acute distress and comfortable SKIN: no acute rashes, no significant lesions EYES: conjunctiva are pink and non-injected, sclera anicteric OROPHARYNX: MMM, no exudates, no oropharyngeal erythema or ulceration NECK: supple, no JVD LYMPH:  no palpable lymphadenopathy in the cervical, axillary or inguinal regions LUNGS: clear to auscultation b/l with normal respiratory effort HEART: regular rate & rhythm ABDOMEN:  normoactive bowel sounds , non tender, not distended. No palpable hepatosplenomegaly.  Extremity: no pedal edema PSYCH: alert & oriented x 3 with fluent speech NEURO: no focal motor/sensory deficits  LABORATORY DATA:  I have reviewed the data as  listed  . CBC Latest Ref Rng & Units 08/10/2020 07/27/2020 07/13/2020  WBC 4.0 - 10.5 K/uL 3.8(L) 3.9(L) 3.9(L)  Hemoglobin 12.0 - 15.0 g/dL 10.5(L) 10.3(L) 10.2(L)  Hematocrit 36 - 46 % 32.8(L) 31.8(L) 31.2(L)  Platelets 150 - 400 K/uL 205 210 220  ANC 1300 . CBC  Component Value Date/Time   WBC 3.8 (L) 08/10/2020 1014   RBC 3.50 (L) 08/10/2020 1014   HGB 10.5 (L) 08/10/2020 1014   HGB 11.3 (L) 11/12/2019 0836   HGB 11.3 (L) 09/30/2017 1145   HCT 32.8 (L) 08/10/2020 1014   HCT 35.1 09/30/2017 1145   PLT 205 08/10/2020 1014   PLT 149 (L) 11/12/2019 0836   PLT 172 09/30/2017 1145   MCV 93.7 08/10/2020 1014   MCV 97.5 09/30/2017 1145   MCH 30.0 08/10/2020 1014   MCHC 32.0 08/10/2020 1014   RDW 15.0 08/10/2020 1014   RDW 14.0 09/30/2017 1145   LYMPHSABS 1.6 08/10/2020 1014   LYMPHSABS 1.3 09/30/2017 1145   MONOABS 0.4 08/10/2020 1014   MONOABS 0.2 09/30/2017 1145   EOSABS 0.2 08/10/2020 1014   EOSABS 0.0 09/30/2017 1145   EOSABS 0.1 06/03/2014 1003   BASOSABS 0.1 08/10/2020 1014   BASOSABS 0.0 09/30/2017 1145     . CMP Latest Ref Rng & Units 08/10/2020 07/27/2020 07/13/2020  Glucose 70 - 99 mg/dL 102(H) 90 89  BUN 8 - 23 mg/dL _0 Creatinine 0.44 - 1.00 mg/dL 0.81 0.77 0.75  Sodium 135 - 145 mmol/L 140 140 140  Potassium 3.5 - 5.1 mmol/L 4.1 3.9 3.8  Chloride 98 - 111 mmol/L 109 111 109  CO2 22 - 32 mmol/L _1 Calcium 8.9 - 10.3 mg/dL 9.2 9.3 8.9  Total Protein 6.5 - 8.1 g/dL 6.2(L) 6.4(L) 6.6  Total Bilirubin 0.3 - 1.2 mg/dL 0.9 0.8 0.8  Alkaline Phos 38 - 126 U/L 72 84 77  AST 15 - 41 U/L _2 ALT 0 - 44 U/L _3 06/08/2020 K/L light chains:         Component     Latest Ref Rng & Units 11/08/2016  LDH     125 - 245 U/L 220  Beta 2     0.6 - 2.4 mg/L 2.5 (H)   Component     Latest Ref Rng & Units 07/30/2017  Ferritin     9 - 269 ng/ml 81  Vitamin B12     232 - 1,245 pg/mL 594   Magnesium: Component Magnesium   Latest Ref Rng & Units 1.7 - 2.4 mg/dL  10/02/2019 1.7  10/21/2019 1.7  11/12/2019 1.7    03/11/19 BM Bx:   03/11/19 Cytogenetics:            RADIOGRAPHIC STUDIES: I have personally reviewed the radiological images as listed and agreed with the findings in the report. No results found.   Bone survey 11/08/2016 IMPRESSION: 1. Small lytic lesion in the right scapula. 2. Possible small lytic lesion in the left scapula. 3. Postoperative changes. 4. Significant degenerative changes in both hips, left greater than right. 5. No evidence for acute fracture   Electronically Signed   By: Nolon Nations M.D.   On: 11/08/2016 16:19   ASSESSMENT & PLAN:   69 y.o. very pleasant lady with history of   1) Multiple myeloma (concern for small lytic lesions in left and right scapulae) - (appears light chain producing) and Progressive anemia  This was apparently diagnosed as smoldering myeloma in 2011 by her hematologist in [place].  No renal failure/hypercalcemia.  Bone survey with concern for possible small lytic lesions in B/L scapulae. PET/CT scan on 03/12/2017 showed no concerning bone lesions.  Initial Bm Bx with 17% kappa restricted plasma cells consistent with plasma  cell neoplasm.  No treatment so far.  03/11/19 BM Bx revealed involvement by 50% plasma cells; genetics -high risk t(14;16), previous genetics from April 2018 BM Bx were standard risk  03/16/19 MRI Lumbar reveals several explanations for her lower back pain including L2-L3 stenosis, but reassuringly, there was not evidence of involvement by multiple myeloma  04/01/19 PET/CT which revealed no FDG evidence of active multiple myeloma within the skeleton. No evidence of lytic lesions within the skeleton or soft tissue Plasmacytoma.  05/07/2019 DXA scan revealed osteopenia, T-score of -1.2.  2) Chronic leukopenia - ? Related to SMM vs other nutritional deficiencies (h/o gastric bypass surgery put her at  risk for nutritional deficiencies).  WBC counts of have been close to normal recently today at 3.8k with an [employer] of 1600.  B12 levels WNL  Ferritin adequate  ?additional factor -recent NSAID use.  ?element of benign ethnic neutropenia.  Has not had any issues with frequent infection.     4) Neuropathy/radiculopathy -Continue follow up with orthopedics  5) Superficial Venous Thrombosis of the left small saphenous vein  06/24/2019 US venous left : Right: There is no evidence of deep vein thrombosis in the lower extremity. No cystic structure found in the popliteal fossa. Left: Findings consistent with acute superficial vein thrombosis involving the left small saphenous vein. There is no evidence of deep vein thrombosis in the lower extremity. However, portions of this examination were limited- see technologist comments above. No cystic structure found in the popliteal fossa  PLAN: -Patient's labs show stable mild anemia and stable renal function normal calcium levels. -We will be checking myeloma panel and serum free light chains every 2 months unless new and acute symptoms. -No prohibitive toxicities from continuing current dose of carfilzomib at 56 mg/m. -We will continue current premedications. -Patient was advised healthy diet exercise and stress relieving strategies. -She has had some chronic back pain and joint issues but those are pretty stable and not limiting her quality of life too much at this time. -No new dental issues   FOLLOW UP: Please schedule next 3 cycles (6 doses) Of Carfilzomib  Portflush and labs with each appointment MD visit in 8 weeks Plz schedule zometa q12weeks - 3 doses   The total time spent in the appt was 30 minutes and more than 50% was on counseling and direct patient cares, monitoring and management of chemotherapy.  All of the patient's questions were answered with apparent satisfaction. The patient knows to call the clinic with any problems,  questions or concerns.    Sullivan Lone MD [place] AAHIVMS [facility] Hematology/Oncology Physician [facility]  (Office):       [phone] (Work cell):  [phone] (Fax):           [phone]  I, Yevette Edwards, am acting as a scribe for Dr. Sullivan Lone.   .I have reviewed the above documentation for accuracy and completeness, and I agree with the above. Brunetta Genera MD

## 2020-08-10 ENCOUNTER — Inpatient Hospital Stay (HOSPITAL_BASED_OUTPATIENT_CLINIC_OR_DEPARTMENT_OTHER): Payer: Medicare Other | Admitting: Hematology

## 2020-08-10 ENCOUNTER — Other Ambulatory Visit: Payer: Self-pay

## 2020-08-10 ENCOUNTER — Inpatient Hospital Stay: Payer: Medicare Other

## 2020-08-10 ENCOUNTER — Ambulatory Visit: Payer: Medicare Other

## 2020-08-10 VITALS — BP 154/88 | HR 81 | Temp 97.8°F | Resp 18 | Ht 63.0 in | Wt 191.5 lb

## 2020-08-10 DIAGNOSIS — C9000 Multiple myeloma not having achieved remission: Secondary | ICD-10-CM

## 2020-08-10 DIAGNOSIS — Z95828 Presence of other vascular implants and grafts: Secondary | ICD-10-CM

## 2020-08-10 DIAGNOSIS — Z5111 Encounter for antineoplastic chemotherapy: Secondary | ICD-10-CM

## 2020-08-10 DIAGNOSIS — Z86718 Personal history of other venous thrombosis and embolism: Secondary | ICD-10-CM | POA: Diagnosis not present

## 2020-08-10 DIAGNOSIS — Z5112 Encounter for antineoplastic immunotherapy: Secondary | ICD-10-CM | POA: Diagnosis not present

## 2020-08-10 DIAGNOSIS — Z0000 Encounter for general adult medical examination without abnormal findings: Secondary | ICD-10-CM

## 2020-08-10 DIAGNOSIS — Z23 Encounter for immunization: Secondary | ICD-10-CM | POA: Diagnosis not present

## 2020-08-10 DIAGNOSIS — D649 Anemia, unspecified: Secondary | ICD-10-CM | POA: Diagnosis not present

## 2020-08-10 DIAGNOSIS — G629 Polyneuropathy, unspecified: Secondary | ICD-10-CM | POA: Diagnosis not present

## 2020-08-10 DIAGNOSIS — D702 Other drug-induced agranulocytosis: Secondary | ICD-10-CM

## 2020-08-10 DIAGNOSIS — Z7189 Other specified counseling: Secondary | ICD-10-CM

## 2020-08-10 LAB — CMP (CANCER CENTER ONLY)
ALT: 15 U/L (ref 0–44)
AST: 17 U/L (ref 15–41)
Albumin: 3.7 g/dL (ref 3.5–5.0)
Alkaline Phosphatase: 72 U/L (ref 38–126)
Anion gap: 8 (ref 5–15)
BUN: 18 mg/dL (ref 8–23)
CO2: 23 mmol/L (ref 22–32)
Calcium: 9.2 mg/dL (ref 8.9–10.3)
Chloride: 109 mmol/L (ref 98–111)
Creatinine: 0.81 mg/dL (ref 0.44–1.00)
GFR, Estimated: >60 mL/min (ref >60)
Glucose, Bld: 102 mg/dL — ABNORMAL HIGH (ref 70–99)
Potassium: 4.1 mmol/L (ref 3.5–5.1)
Sodium: 140 mmol/L (ref 135–145)
Total Bilirubin: 0.9 mg/dL (ref 0.3–1.2)
Total Protein: 6.2 g/dL — ABNORMAL LOW (ref 6.5–8.1)

## 2020-08-10 LAB — CBC WITH DIFFERENTIAL/PLATELET
Abs Immature Granulocytes: 0.01 10*3/uL (ref 0.00–0.07)
Basophils Absolute: 0.1 10*3/uL (ref 0.0–0.1)
Basophils Relative: 1 %
Eosinophils Absolute: 0.2 10*3/uL (ref 0.0–0.5)
Eosinophils Relative: 5 %
HCT: 32.8 % — ABNORMAL LOW (ref 36.0–46.0)
Hemoglobin: 10.5 g/dL — ABNORMAL LOW (ref 12.0–15.0)
Immature Granulocytes: 0 %
Lymphocytes Relative: 42 %
Lymphs Abs: 1.6 10*3/uL (ref 0.7–4.0)
MCH: 30 pg (ref 26.0–34.0)
MCHC: 32 g/dL (ref 30.0–36.0)
MCV: 93.7 fL (ref 80.0–100.0)
Monocytes Absolute: 0.4 10*3/uL (ref 0.1–1.0)
Monocytes Relative: 11 %
Neutro Abs: 1.6 10*3/uL — ABNORMAL LOW (ref 1.7–7.7)
Neutrophils Relative %: 41 %
Platelets: 205 10*3/uL (ref 150–400)
RBC: 3.5 MIL/uL — ABNORMAL LOW (ref 3.87–5.11)
RDW: 15 % (ref 11.5–15.5)
WBC: 3.8 10*3/uL — ABNORMAL LOW (ref 4.0–10.5)
nRBC: 0 % (ref 0.0–0.2)

## 2020-08-10 MED ORDER — HEPARIN SOD (PORK) LOCK FLUSH 100 UNIT/ML IV SOLN
500.0000 [IU] | Freq: Once | INTRAVENOUS | Status: AC | PRN
  Administered 2020-08-10: 500 [IU]
  Filled 2020-08-10: qty 5

## 2020-08-10 MED ORDER — ZOLEDRONIC ACID 4 MG/100ML IV SOLN
4.0000 mg | Freq: Once | INTRAVENOUS | Status: AC
  Administered 2020-08-10: 4 mg via INTRAVENOUS

## 2020-08-10 MED ORDER — SODIUM CHLORIDE 0.9 % IV SOLN
Freq: Once | INTRAVENOUS | Status: AC
  Filled 2020-08-10: qty 250

## 2020-08-10 MED ORDER — SODIUM CHLORIDE 0.9% FLUSH
10.0000 mL | INTRAVENOUS | Status: DC | PRN
  Administered 2020-08-10: 10 mL
  Filled 2020-08-10: qty 10

## 2020-08-10 MED ORDER — PROCHLORPERAZINE MALEATE 10 MG PO TABS
ORAL_TABLET | ORAL | Status: AC
  Filled 2020-08-10: qty 1

## 2020-08-10 MED ORDER — PROCHLORPERAZINE MALEATE 10 MG PO TABS
10.0000 mg | ORAL_TABLET | Freq: Once | ORAL | Status: AC
  Administered 2020-08-10: 10 mg via ORAL

## 2020-08-10 MED ORDER — ACETAMINOPHEN 500 MG PO TABS
1000.0000 mg | ORAL_TABLET | Freq: Once | ORAL | Status: AC
  Administered 2020-08-10: 1000 mg via ORAL

## 2020-08-10 MED ORDER — DEXTROSE 5 % IV SOLN
56.0000 mg/m2 | Freq: Once | INTRAVENOUS | Status: AC
  Administered 2020-08-10: 120 mg via INTRAVENOUS
  Filled 2020-08-10: qty 60

## 2020-08-10 MED ORDER — INFLUENZA VAC A&B SA ADJ QUAD 0.5 ML IM PRSY
PREFILLED_SYRINGE | INTRAMUSCULAR | Status: AC
  Filled 2020-08-10: qty 0.5

## 2020-08-10 MED ORDER — ACETAMINOPHEN 500 MG PO TABS
ORAL_TABLET | ORAL | Status: AC
  Filled 2020-08-10: qty 2

## 2020-08-10 MED ORDER — SODIUM CHLORIDE 0.9 % IV SOLN
Freq: Once | INTRAVENOUS | Status: DC
  Filled 2020-08-10: qty 250

## 2020-08-10 MED ORDER — SODIUM CHLORIDE 0.9 % IV SOLN
20.0000 mg | Freq: Once | INTRAVENOUS | Status: AC
  Administered 2020-08-10: 20 mg via INTRAVENOUS
  Filled 2020-08-10: qty 20

## 2020-08-10 MED ORDER — SODIUM CHLORIDE 0.9% FLUSH
10.0000 mL | Freq: Once | INTRAVENOUS | Status: AC | PRN
  Administered 2020-08-10: 10 mL
  Filled 2020-08-10: qty 10

## 2020-08-10 MED ORDER — INFLUENZA VAC A&B SA ADJ QUAD 0.5 ML IM PRSY
0.5000 mL | PREFILLED_SYRINGE | Freq: Once | INTRAMUSCULAR | Status: AC
  Administered 2020-08-10: 0.5 mL via INTRAMUSCULAR

## 2020-08-10 MED ORDER — ZOLEDRONIC ACID 4 MG/100ML IV SOLN
INTRAVENOUS | Status: AC
  Filled 2020-08-10: qty 100

## 2020-08-10 NOTE — Patient Instructions (Signed)
[facility] Discharge Instructions for Patients Receiving Chemotherapy  Today you received the following chemotherapy agents: carfilzomib.  To help prevent nausea and vomiting after your treatment, we encourage you to take your nausea medication as directed.   If you develop nausea and vomiting that is not controlled by your nausea medication, call the clinic.   BELOW ARE SYMPTOMS THAT SHOULD BE REPORTED IMMEDIATELY:  *FEVER GREATER THAN 100.5 F  *CHILLS WITH OR WITHOUT FEVER  NAUSEA AND VOMITING THAT IS NOT CONTROLLED WITH YOUR NAUSEA MEDICATION  *UNUSUAL SHORTNESS OF BREATH  *UNUSUAL BRUISING OR BLEEDING  TENDERNESS IN MOUTH AND THROAT WITH OR WITHOUT PRESENCE OF ULCERS  *URINARY PROBLEMS  *BOWEL PROBLEMS  UNUSUAL RASH Items with * indicate a potential emergency and should be followed up as soon as possible.  Feel free to call the clinic should you have any questions or concerns. The clinic phone number is [phone].  Please show the [place] at check-in to the Emergency Department and triage nurse.  Zoledronic Acid injection (Hypercalcemia, Oncology) What is this medicine? ZOLEDRONIC ACID (ZOE le dron ik AS id) lowers the amount of calcium loss from bone. It is used to treat too much calcium in your blood from cancer. It is also used to prevent complications of cancer that has spread to the bone. This medicine may be used for other purposes; ask your health care provider or pharmacist if you have questions. COMMON BRAND NAME(S): Zometa What should I tell my health care provider before I take this medicine? They need to know if you have any of these conditions: aspirin-sensitive asthma cancer, especially if you are receiving medicines used to treat cancer dental disease or wear dentures infection kidney disease receiving corticosteroids like dexamethasone or prednisone an unusual or allergic reaction to zoledronic acid, other medicines,  foods, dyes, or preservatives pregnant or trying to get pregnant breast-feeding How should I use this medicine? This medicine is for infusion into a vein. It is given by a health care professional in a hospital or clinic setting. Talk to your pediatrician regarding the use of this medicine in children. Special care may be needed. Overdosage: If you think you have taken too much of this medicine contact a poison control center or emergency room at once. NOTE: This medicine is only for you. Do not share this medicine with others. What if I miss a dose? It is important not to miss your dose. Call your doctor or health care professional if you are unable to keep an appointment. What may interact with this medicine? certain antibiotics given by injection NSAIDs, medicines for pain and inflammation, like ibuprofen or naproxen some diuretics like bumetanide, furosemide teriparatide thalidomide This list may not describe all possible interactions. Give your health care provider a list of all the medicines, herbs, non-prescription drugs, or dietary supplements you use. Also tell them if you smoke, drink alcohol, or use illegal drugs. Some items may interact with your medicine. What should I watch for while using this medicine? Visit your doctor or health care professional for regular checkups. It may be some time before you see the benefit from this medicine. Do not stop taking your medicine unless your doctor tells you to. Your doctor may order blood tests or other tests to see how you are doing. Women should inform their doctor if they wish to become pregnant or think they might be pregnant. There is a potential for serious side effects to an unborn child. Talk to  your health care professional or pharmacist for more information. You should make sure that you get enough calcium and vitamin D while you are taking this medicine. Discuss the foods you eat and the vitamins you take with your health care  professional. Some people who take this medicine have severe bone, joint, and/or muscle pain. This medicine may also increase your risk for jaw problems or a broken thigh bone. Tell your doctor right away if you have severe pain in your jaw, bones, joints, or muscles. Tell your doctor if you have any pain that does not go away or that gets worse. Tell your dentist and dental surgeon that you are taking this medicine. You should not have major dental surgery while on this medicine. See your dentist to have a dental exam and fix any dental problems before starting this medicine. Take good care of your teeth while on this medicine. Make sure you see your dentist for regular follow-up appointments. What side effects may I notice from receiving this medicine? Side effects that you should report to your doctor or health care professional as soon as possible: allergic reactions like skin rash, itching or hives, swelling of the face, lips, or tongue anxiety, confusion, or depression breathing problems changes in vision eye pain feeling faint or lightheaded, falls jaw pain, especially after dental work mouth sores muscle cramps, stiffness, or weakness redness, blistering, peeling or loosening of the skin, including inside the mouth trouble passing urine or change in the amount of urine Side effects that usually do not require medical attention (report to your doctor or health care professional if they continue or are bothersome): bone, joint, or muscle pain constipation diarrhea fever hair loss irritation at site where injected loss of appetite nausea, vomiting stomach upset trouble sleeping trouble swallowing weak or tired This list may not describe all possible side effects. Call your doctor for medical advice about side effects. You may report side effects to FDA at 1-800-FDA-1088. Where should I keep my medicine? This drug is given in a hospital or clinic and will not be stored at  home. NOTE: This sheet is a summary. It may not cover all possible information. If you have questions about this medicine, talk to your doctor, pharmacist, or health care provider.  2020 Elsevier/Gold Standard (2014-02-27 14:19:39)

## 2020-08-11 ENCOUNTER — Ambulatory Visit
Admission: RE | Admit: 2020-08-11 | Discharge: 2020-08-11 | Disposition: A | Discharge status: Home / Self Care | Payer: Medicare Other | Source: Ambulatory Visit | Attending: Internal Medicine | Admitting: Internal Medicine

## 2020-08-11 DIAGNOSIS — Z1231 Encounter for screening mammogram for malignant neoplasm of breast: Secondary | ICD-10-CM

## 2020-08-22 ENCOUNTER — Other Ambulatory Visit: Payer: Self-pay

## 2020-08-22 ENCOUNTER — Other Ambulatory Visit (HOSPITAL_COMMUNITY)
Admission: RE | Admit: 2020-08-22 | Discharge: 2020-08-22 | Disposition: A | Discharge status: Home / Self Care | Payer: Medicare Other | Source: Ambulatory Visit | Attending: Gastroenterology | Admitting: Gastroenterology

## 2020-08-22 DIAGNOSIS — Z20822 Contact with and (suspected) exposure to covid-19: Secondary | ICD-10-CM | POA: Diagnosis not present

## 2020-08-22 DIAGNOSIS — Z01818 Encounter for other preprocedural examination: Secondary | ICD-10-CM | POA: Diagnosis not present

## 2020-08-22 LAB — SARS CORONAVIRUS 2 (TAT 6-24 HRS): SARS Coronavirus 2: NEGATIVE

## 2020-08-23 ENCOUNTER — Encounter (HOSPITAL_COMMUNITY)
Admission: RE | Disposition: A | Discharge status: Home / Self Care | Payer: Self-pay | Source: Home / Self Care | Attending: Gastroenterology

## 2020-08-23 ENCOUNTER — Encounter (HOSPITAL_COMMUNITY): Payer: Self-pay | Admitting: Gastroenterology

## 2020-08-23 ENCOUNTER — Ambulatory Visit (HOSPITAL_COMMUNITY): Payer: Medicare Other | Admitting: Anesthesiology

## 2020-08-23 ENCOUNTER — Other Ambulatory Visit: Payer: Self-pay

## 2020-08-23 ENCOUNTER — Ambulatory Visit (HOSPITAL_COMMUNITY)
Admission: RE | Admit: 2020-08-23 | Discharge: 2020-08-23 | Disposition: A | Discharge status: Home / Self Care | Payer: Medicare Other | Attending: Gastroenterology | Admitting: Gastroenterology

## 2020-08-23 DIAGNOSIS — Z79899 Other long term (current) drug therapy: Secondary | ICD-10-CM | POA: Insufficient documentation

## 2020-08-23 DIAGNOSIS — K5730 Diverticulosis of large intestine without perforation or abscess without bleeding: Secondary | ICD-10-CM | POA: Diagnosis not present

## 2020-08-23 DIAGNOSIS — Z1211 Encounter for screening for malignant neoplasm of colon: Secondary | ICD-10-CM | POA: Diagnosis not present

## 2020-08-23 DIAGNOSIS — K649 Unspecified hemorrhoids: Secondary | ICD-10-CM | POA: Diagnosis not present

## 2020-08-23 DIAGNOSIS — K648 Other hemorrhoids: Secondary | ICD-10-CM | POA: Diagnosis not present

## 2020-08-23 DIAGNOSIS — C9000 Multiple myeloma not having achieved remission: Secondary | ICD-10-CM | POA: Insufficient documentation

## 2020-08-23 DIAGNOSIS — Z885 Allergy status to narcotic agent status: Secondary | ICD-10-CM | POA: Insufficient documentation

## 2020-08-23 DIAGNOSIS — D122 Benign neoplasm of ascending colon: Secondary | ICD-10-CM | POA: Diagnosis not present

## 2020-08-23 DIAGNOSIS — Z87891 Personal history of nicotine dependence: Secondary | ICD-10-CM | POA: Insufficient documentation

## 2020-08-23 DIAGNOSIS — Z8371 Family history of colonic polyps: Secondary | ICD-10-CM | POA: Insufficient documentation

## 2020-08-23 DIAGNOSIS — K635 Polyp of colon: Secondary | ICD-10-CM | POA: Diagnosis not present

## 2020-08-23 HISTORY — PX: COLONOSCOPY WITH PROPOFOL: SHX5780

## 2020-08-23 HISTORY — PX: POLYPECTOMY: SHX5525

## 2020-08-23 LAB — HM COLONOSCOPY

## 2020-08-23 SURGERY — COLONOSCOPY WITH PROPOFOL
Anesthesia: General

## 2020-08-23 MED ORDER — LACTATED RINGERS IV SOLN: Freq: Once | INTRAVENOUS | Status: AC

## 2020-08-23 MED ORDER — LACTATED RINGERS IV SOLN: INTRAVENOUS | Status: DC | PRN

## 2020-08-23 MED ORDER — CHLORHEXIDINE GLUCONATE CLOTH 2 % EX PADS: 6.0000 | MEDICATED_PAD | Freq: Once | CUTANEOUS | Status: DC

## 2020-08-23 MED ORDER — PROPOFOL 10 MG/ML IV BOLUS
INTRAVENOUS | Status: DC | PRN
  Administered 2020-08-23 (×3): 50 mg via INTRAVENOUS

## 2020-08-23 MED ORDER — STERILE WATER FOR IRRIGATION IR SOLN
Status: DC | PRN
  Administered 2020-08-23: 100 mL

## 2020-08-23 MED ORDER — PROPOFOL 500 MG/50ML IV EMUL
INTRAVENOUS | Status: DC | PRN
  Administered 2020-08-23: 150 ug/kg/min via INTRAVENOUS

## 2020-08-23 NOTE — Transfer of Care (Signed)
Immediate Anesthesia Transfer of Care Note  Patient: Madison Parker  Procedure(s) Performed: COLONOSCOPY WITH PROPOFOL (N/A ) POLYPECTOMY  Patient Location: PACU  Anesthesia Type:General  Level of Consciousness: awake, alert , oriented and patient cooperative  Airway & Oxygen Therapy: Patient Spontanous Breathing  Post-op Assessment: Report given to RN, Post -op Vital signs reviewed and stable and Patient moving all extremities X 4  Post vital signs: Reviewed and stable  Last Vitals:  Vitals Value Taken Time  BP    Temp    Pulse    Resp    SpO2      Last Pain:  Vitals:   08/23/20 0855  TempSrc:   PainSc: 0-No pain         Complications: No complications documented.

## 2020-08-23 NOTE — Discharge Instructions (Signed)
You are being discharged to home.  Resume your previous diet.  We are waiting for your pathology results.  Your physician has recommended a repeat colonoscopy in three months for surveillance.    Hemorrhoids Hemorrhoids are swollen veins that may develop:  In the butt (rectum). These are called internal hemorrhoids.  Around the opening of the butt (anus). These are called external hemorrhoids. Hemorrhoids can cause pain, itching, or bleeding. Most of the time, they do not cause serious problems. They usually get better with diet changes, lifestyle changes, and other home treatments. What are the causes? This condition may be caused by:  Having trouble pooping (constipation).  Pushing hard (straining) to poop.  Watery poop (diarrhea).  Pregnancy.  Being very overweight (obese).  Sitting for long periods of time.  Heavy lifting or other activity that causes you to strain.  Anal sex.  Riding a bike for a long period of time. What are the signs or symptoms? Symptoms of this condition include:  Pain.  Itching or soreness in the butt.  Bleeding from the butt.  Leaking poop.  Swelling in the area.  One or more lumps around the opening of your butt. How is this diagnosed? A doctor can often diagnose this condition by looking at the affected area. The doctor may also:  Do an exam that involves feeling the area with a gloved hand (digital rectal exam).  Examine the area inside your butt using a small tube (anoscope).  Order blood tests. This may be done if you have lost a lot of blood.  Have you get a test that involves looking inside the colon using a flexible tube with a camera on the end (sigmoidoscopy or colonoscopy). How is this treated? This condition can usually be treated at home. Your doctor may tell you to change what you eat, make lifestyle changes, or try home treatments. If these do not help, procedures can be done to remove the hemorrhoids or make them  smaller. These may involve:  Placing rubber bands at the base of the hemorrhoids to cut off their blood supply.  Injecting medicine into the hemorrhoids to shrink them.  Shining a type of light energy onto the hemorrhoids to cause them to fall off.  Doing surgery to remove the hemorrhoids or cut off their blood supply. Follow these instructions at home: Eating and drinking   Eat foods that have a lot of fiber in them. These include whole grains, beans, nuts, fruits, and vegetables.  Ask your doctor about taking products that have added fiber (fibersupplements).  Reduce the amount of fat in your diet. You can do this by: ? Eating low-fat dairy products. ? Eating less red meat. ? Avoiding processed foods.  Drink enough fluid to keep your pee (urine) pale yellow. Managing pain and swelling   Take a warm-water bath (sitz bath) for 20 minutes to ease pain. Do this 3-4 times a day. You may do this in a bathtub or using a portable sitz bath that fits over the toilet.  If told, put ice on the painful area. It may be helpful to use ice between your warm baths. ? Put ice in a plastic bag. ? Place a towel between your skin and the bag. ? Leave the ice on for 20 minutes, 2-3 times a day. General instructions  Take over-the-counter and prescription medicines only as told by your doctor. ? Medicated creams and medicines may be used as told.  Exercise often. Ask your doctor how much  and what kind of exercise is best for you.  Go to the bathroom when you have the urge to poop. Do not wait.  Avoid pushing too hard when you poop.  Keep your butt dry and clean. Use wet toilet paper or moist towelettes after pooping.  Do not sit on the toilet for a long time.  Keep all follow-up visits as told by your doctor. This is important. Contact a doctor if you:  Have pain and swelling that do not get better with treatment or medicine.  Have trouble pooping.  Cannot poop.  Have pain or  swelling outside the area of the hemorrhoids. Get help right away if you have:  Bleeding that will not stop. Summary  Hemorrhoids are swollen veins in the butt or around the opening of the butt.  They can cause pain, itching, or bleeding.  Eat foods that have a lot of fiber in them. These include whole grains, beans, nuts, fruits, and vegetables.  Take a warm-water bath (sitz bath) for 20 minutes to ease pain. Do this 3-4 times a day. This information is not intended to replace advice given to you by your health care provider. Make sure you discuss any questions you have with your health care provider. Document Revised: 10/09/2018 Document Reviewed: 02/20/2018 Elsevier Patient Education  [place].  Diverticulosis  Diverticulosis is a condition that develops when small pouches (diverticula) form in the wall of the large intestine (colon). The colon is where water is absorbed and stool (feces) is formed. The pouches form when the inside layer of the colon pushes through weak spots in the outer layers of the colon. You may have a few pouches or many of them. The pouches usually do not cause problems unless they become inflamed or infected. When this happens, the condition is called diverticulitis. What are the causes? The cause of this condition is not known. What increases the risk? The following factors may make you more likely to develop this condition:  Being older than age 74. Your risk for this condition increases with age. Diverticulosis is rare among people younger than age 94. By age 31, many people have it.  Eating a low-fiber diet.  Having frequent constipation.  Being overweight.  Not getting enough exercise.  Smoking.  Taking over-the-counter pain medicines, like aspirin and ibuprofen.  Having a family history of diverticulosis. What are the signs or symptoms? In most people, there are no symptoms of this condition. If you do have symptoms, they may  include:  Bloating.  Cramps in the abdomen.  Constipation or diarrhea.  Pain in the lower left side of the abdomen. How is this diagnosed? Because diverticulosis usually has no symptoms, it is most often diagnosed during an exam for other colon problems. The condition may be diagnosed by:  Using a flexible scope to examine the colon (colonoscopy).  Taking an X-ray of the colon after dye has been put into the colon (barium enema).  Having a CT scan. How is this treated? You may not need treatment for this condition. Your health care provider may recommend treatment to prevent problems. You may need treatment if you have symptoms or if you previously had diverticulitis. Treatment may include:  Eating a high-fiber diet.  Taking a fiber supplement.  Taking a live bacteria supplement (probiotic).  Taking medicine to relax your colon. Follow these instructions at home: Medicines  Take over-the-counter and prescription medicines only as told by your health care provider.  If told by  your health care provider, take a fiber supplement or probiotic. Constipation prevention Your condition may cause constipation. To prevent or treat constipation, you may need to:  Drink enough fluid to keep your urine pale yellow.  Take over-the-counter or prescription medicines.  Eat foods that are high in fiber, such as beans, whole grains, and fresh fruits and vegetables.  Limit foods that are high in fat and processed sugars, such as fried or sweet foods.  General instructions  Try not to strain when you have a bowel movement.  Keep all follow-up visits as told by your health care provider. This is important. Contact a health care provider if you:  Have pain in your abdomen.  Have bloating.  Have cramps.  Have not had a bowel movement in 3 days. Get help right away if:  Your pain gets worse.  Your bloating becomes very bad.  You have a fever or chills, and your symptoms  suddenly get worse.  You vomit.  You have bowel movements that are bloody or black.  You have bleeding from your rectum. Summary  Diverticulosis is a condition that develops when small pouches (diverticula) form in the wall of the large intestine (colon).  You may have a few pouches or many of them.  This condition is most often diagnosed during an exam for other colon problems.  Treatment may include increasing the fiber in your diet, taking supplements, or taking medicines. This information is not intended to replace advice given to you by your health care provider. Make sure you discuss any questions you have with your health care provider. Document Revised: 04/30/2019 Document Reviewed: 04/30/2019 Elsevier Patient Education  [place].  Colonoscopy, Adult, Care After This sheet gives you information about how to care for yourself after your procedure. Your health care provider may also give you more specific instructions. If you have problems or questions, contact your health care provider. What can I expect after the procedure? After the procedure, it is common to have:  A small amount of blood in your stool for 24 hours after the procedure.  Some gas.  Mild cramping or bloating of your abdomen. Follow these instructions at home: Eating and drinking   Drink enough fluid to keep your urine pale yellow.  Follow instructions from your health care provider about eating or drinking restrictions.  Resume your normal diet as instructed by your health care provider. Avoid heavy or fried foods that are hard to digest. Activity  Rest as told by your health care provider.  Avoid sitting for a long time without moving. Get up to take short walks every 1-2 hours. This is important to improve blood flow and breathing. Ask for help if you feel weak or unsteady.  Return to your normal activities as told by your health care provider. Ask your health care provider what  activities are safe for you. Managing cramping and bloating   Try walking around when you have cramps or feel bloated.  Apply heat to your abdomen as told by your health care provider. Use the heat source that your health care provider recommends, such as a moist heat pack or a heating pad. ? Place a towel between your skin and the heat source. ? Leave the heat on for 20-30 minutes. ? Remove the heat if your skin turns bright red. This is especially important if you are unable to feel pain, heat, or cold. You may have a greater risk of getting burned. General instructions  For  the first 24 hours after the procedure: ? Do not drive or use machinery. ? Do not sign important documents. ? Do not drink alcohol. ? Do your regular daily activities at a slower pace than normal. ? Eat soft foods that are easy to digest.  Take over-the-counter and prescription medicines only as told by your health care provider.  Keep all follow-up visits as told by your health care provider. This is important. Contact a health care provider if:  You have blood in your stool 2-3 days after the procedure. Get help right away if you have:  More than a small spotting of blood in your stool.  Large blood clots in your stool.  Swelling of your abdomen.  Nausea or vomiting.  A fever.  Increasing pain in your abdomen that is not relieved with medicine. Summary  After the procedure, it is common to have a small amount of blood in your stool. You may also have mild cramping and bloating of your abdomen.  For the first 24 hours after the procedure, do not drive or use machinery, sign important documents, or drink alcohol.  Get help right away if you have a lot of blood in your stool, nausea or vomiting, a fever, or increased pain in your abdomen. This information is not intended to replace advice given to you by your health care provider. Make sure you discuss any questions you have with your health care  provider. Document Revised: 04/27/2019 Document Reviewed: 04/27/2019 Elsevier Patient Education  [place].  Colon Polyps  Polyps are tissue growths inside the body. Polyps can grow in many places, including the large intestine (colon). A polyp may be a round bump or a mushroom-shaped growth. You could have one polyp or several. Most colon polyps are noncancerous (benign). However, some colon polyps can become cancerous over time. Finding and removing the polyps early can help prevent this. What are the causes? The exact cause of colon polyps is not known. What increases the risk? You are more likely to develop this condition if you:  Have a family history of colon cancer or colon polyps.  Are older than 26 or older than 45 if you are African American.  Have inflammatory bowel disease, such as ulcerative colitis or Crohn's disease.  Have certain hereditary conditions, such as: ? Familial adenomatous polyposis. ? Lynch syndrome. ? Turcot syndrome. ? Peutz-Jeghers syndrome.  Are overweight.  Smoke cigarettes.  Do not get enough exercise.  Drink too much alcohol.  Eat a diet that is high in fat and red meat and low in fiber.  Had childhood cancer that was treated with abdominal radiation. What are the signs or symptoms? Most polyps do not cause symptoms. If you have symptoms, they may include:  Blood coming from your rectum when having a bowel movement.  Blood in your stool. The stool may look dark red or black.  Abdominal pain.  A change in bowel habits, such as constipation or diarrhea. How is this diagnosed? This condition is diagnosed with a colonoscopy. This is a procedure in which a lighted, flexible scope is inserted into the anus and then passed into the colon to examine the area. Polyps are sometimes found when a colonoscopy is done as part of routine cancer screening tests. How is this treated? Treatment for this condition involves removing any polyps  that are found. Most polyps can be removed during a colonoscopy. Those polyps will then be tested for cancer. Additional treatment may be needed depending  on the results of testing. Follow these instructions at home: Lifestyle  Maintain a healthy weight, or lose weight if recommended by your health care provider.  Exercise every day or as told by your health care provider.  Do not use any products that contain nicotine or tobacco, such as cigarettes and e-cigarettes. If you need help quitting, ask your health care provider.  If you drink alcohol, limit how much you have: ? 0-1 drink a day for women. ? 0-2 drinks a day for men.  Be aware of how much alcohol is in your drink. In the [place] one drink equals one 12 oz bottle of beer (355 mL), one 5 oz glass of wine (148 mL), or one 1 oz shot of hard liquor (44 mL). Eating and drinking   Eat foods that are high in fiber, such as fruits, vegetables, and whole grains.  Eat foods that are high in calcium and vitamin D, such as milk, cheese, yogurt, eggs, liver, fish, and broccoli.  Limit foods that are high in fat, such as fried foods and desserts.  Limit the amount of red meat and processed meat you eat, such as hot dogs, sausage, bacon, and lunch meats. General instructions  Keep all follow-up visits as told by your health care provider. This is important. ? This includes having regularly scheduled colonoscopies. ? Talk to your health care provider about when you need a colonoscopy. Contact a health care provider if:  You have new or worsening bleeding during a bowel movement.  You have new or increased blood in your stool.  You have a change in bowel habits.  You lose weight for no known reason. Summary  Polyps are tissue growths inside the body. Polyps can grow in many places, including the colon.  Most colon polyps are noncancerous (benign), but some can become cancerous over time.  This condition is diagnosed with a  colonoscopy.  Treatment for this condition involves removing any polyps that are found. Most polyps can be removed during a colonoscopy. This information is not intended to replace advice given to you by your health care provider. Make sure you discuss any questions you have with your health care provider. Document Revised: 01/16/2018 Document Reviewed: 01/16/2018 Elsevier Patient Education  [place].

## 2020-08-23 NOTE — Op Note (Signed)
[facility] Patient Name: Madison Parker Procedure Date: 08/23/2020 8:45 AM MRN: [id number] Date of Birth: Jan 02, 1951 Attending MD: Maylon Peppers ,  CSN: [id number] Age: 69 Admit Type: Outpatient Procedure:                Colonoscopy Indications:              Screening for colorectal malignant neoplasm Providers:                Maylon Peppers, Lambert Mody Aram Candela Referring MD:              Medicines:                Monitored Anesthesia Care Complications:            No immediate complications. Estimated Blood Loss:     Estimated blood loss: none. Procedure:                Pre-Anesthesia Assessment:                           - Prior to the procedure, a History and Physical                            was performed, and patient medications, allergies                            and sensitivities were reviewed. The patient's                            tolerance of previous anesthesia was reviewed.                           - The risks and benefits of the procedure and the                            sedation options and risks were discussed with the                            patient. All questions were answered and informed                            consent was obtained.                           - ASA Grade Assessment: II - A patient with mild                            systemic disease.                           After obtaining informed consent, the colonoscope                            was passed under direct vision. Throughout the                            procedure, the patient's blood pressure, pulse,  and                            oxygen saturations were monitored continuously. The                            [id number]-[id number] ([id number]) was introduced through the                            anus and advanced to the the cecum, identified by                            appendiceal orifice and ileocecal valve. The                            colonoscopy was performed  without difficulty. The                            patient tolerated the procedure well. The quality                            of the bowel preparation was excellent. Scope                            withdrawal time was 7 minutes. Scope In: 9:00:37 AM Scope Out: 9:22:59 AM Scope Withdrawal Time: 0 hours 8 minutes 7 seconds  Total Procedure Duration: 0 hours 22 minutes 22 seconds  Findings:      The perianal and digital rectal examinations were normal.      A 15 mm polyp was found in the proximal ascending colon. The polyp was       sessile. The polyp was removed with a hot snare. Resection and retrieval       were complete.      A few medium-mouthed diverticula were found in the sigmoid colon.      Non-bleeding internal hemorrhoids were found during retroflexion. The       hemorrhoids were medium-sized. Impression:               - One 15 mm polyp in the proximal ascending colon,                            removed with a hot snare. Resected and retrieved.                           - Diverticulosis in the sigmoid colon.                           - Non-bleeding internal hemorrhoids. Moderate Sedation:      Per Anesthesia Care Recommendation:           - Discharge patient to home (ambulatory).                           - Resume previous diet.                           -  Await pathology results.                           - Repeat colonoscopy in 3 months for surveillance. Procedure Code(s):        --- Professional ---                           [phone], GC, Colonoscopy, flexible; with removal of                            tumor(s), polyp(s), or other lesion(s) by snare                            technique Diagnosis Code(s):        --- Professional ---                           Z12.11, Encounter for screening for malignant                            neoplasm of colon                           K63.5, Polyp of colon                           K64.8, Other hemorrhoids                            K57.30, Diverticulosis of large intestine without                            perforation or abscess without bleeding CPT copyright 2019 American Medical Association. All rights reserved. The codes documented in this report are preliminary and upon coder review may  be revised to meet current compliance requirements. Maylon Peppers, MD Maylon Peppers,  08/23/2020 9:30:55 AM This report has been signed electronically. Number of Addenda: 0

## 2020-08-23 NOTE — Anesthesia Postprocedure Evaluation (Signed)
Anesthesia Post Note  Patient: Madison Parker  Procedure(s) Performed: COLONOSCOPY WITH PROPOFOL (N/A ) POLYPECTOMY  Patient location during evaluation: Phase II Anesthesia Type: General Level of consciousness: awake, oriented, awake and alert and patient cooperative Pain management: satisfactory to patient Vital Signs Assessment: post-procedure vital signs reviewed and stable Respiratory status: spontaneous breathing, respiratory function stable and nonlabored ventilation Cardiovascular status: stable Postop Assessment: no apparent nausea or vomiting Anesthetic complications: no   No complications documented.   Last Vitals:  Vitals:   08/23/20 0745 08/23/20 0927  BP: 122/78 114/62  Pulse:  80  Resp: 16 19  Temp: 36.8 C 36.5 C  SpO2: 100% 100%    Last Pain:  Vitals:   08/23/20 0927  TempSrc: Oral  PainSc: 3                  Velvet Moomaw

## 2020-08-23 NOTE — H&P (Signed)
Madison Parker is an 69 y.o. female.   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  Last colonoscopy was performed 10 years ago.  She denies having any complaints such as melena, hematochezia, abdominal pain or distention, change in her bowel movement consistency or frequency, no changes in her weight recently.  No family history of colorectal cancer, but her brother had polyps in his 48s.    Past Medical History:  Diagnosis Date  . Headache   . Low back pain   . SBO (small bowel obstruction) ([place]) 2010  . Smoldering multiple myeloma ([place])     Past Surgical History:  Procedure Laterality Date  . ABDOMINAL HYSTERECTOMY     complete  . BACK SURGERY     lower at [facility]  . colonscopy  2014  . GASTRIC BYPASS  yrs ago  . HERNIA REPAIR    . IR IMAGING GUIDED PORT INSERTION  04/20/2019  . KNEE SURGERY  06/04/2009   both knees replaced   . TONSILLECTOMY  age 14  . TOTAL HIP ARTHROPLASTY Left 08/16/2017   Procedure: LEFT TOTAL HIP ARTHROPLASTY ANTERIOR APPROACH;  Surgeon: Dorna Leitz, MD;  Location: [facility];  Service: Orthopedics;  Laterality: Left;    History reviewed. No pertinent family history. Social History:  reports that she has quit smoking. Her smoking use included cigarettes. She has a 5.00 pack-year smoking history. She has never used smokeless tobacco. She reports current alcohol use. She reports that she does not use drugs.  Allergies:  Allergies  Allergen Reactions  . Codeine Anxiety    Medications Prior to Admission  Medication Sig Dispense Refill  . acyclovir (ZOVIRAX) 400 MG tablet Take 1 tablet (400 mg total) by mouth 2 (two) times daily. (Patient taking differently: Take 800 mg by mouth daily. ) 180 tablet 0  . FOLIC ACID PO Take 8,882 mg by mouth daily.     . Probiotic Product (PROBIOTIC DAILY PO) Take 2  capsules by mouth daily. Gummies    . TURMERIC-GINGER PO Take 2 each by mouth daily.     . vitamin B-12 (CYANOCOBALAMIN) 1000 MCG tablet Take 1,000 mcg by mouth daily.     . Vitamin D, Ergocalciferol, (DRISDOL) 1.25 MG (50000 UNIT) CAPS capsule TAKE 1 CAPSULE ONCE A WEEK (Patient taking differently: Take 50,000 Units by mouth every 7 (seven) days. Sunday) 13 capsule 3  . lidocaine-prilocaine (EMLA) cream Apply 1 application topically as needed. (Patient not taking: Reported on 08/17/2020) 30 g 2  . ondansetron (ZOFRAN) 8 MG tablet Take 1 tablet (8 mg total) by mouth 2 (two) times daily as needed (Nausea or vomiting). (Patient not taking: Reported on 08/17/2020) 30 tablet 1  . prochlorperazine (COMPAZINE) 10 MG tablet Take 1 tablet (10 mg total) by mouth every 6 (six) hours as needed for nausea or vomiting. (Patient not taking: Reported on 08/17/2020) 30 tablet 0    Results for orders placed or performed during the hospital encounter of 08/22/20 (from the past 48 hour(s))  SARS CORONAVIRUS 2 (TAT 6-24 HRS) Nasopharyngeal Nasopharyngeal Swab     Status: None   Collection Time: 08/22/20  8:30 AM   Specimen: Nasopharyngeal Swab  Result Value Ref Range   SARS Coronavirus 2 NEGATIVE NEGATIVE    Comment: (NOTE) SARS-CoV-2 target nucleic acids are NOT DETECTED.  The SARS-CoV-2 RNA is generally detectable in upper and lower respiratory specimens  during the acute phase of infection. Negative results do not preclude SARS-CoV-2 infection, do not rule out co-infections with other pathogens, and should not be used as the sole basis for treatment or other patient management decisions. Negative results must be combined with clinical observations, patient history, and epidemiological information. The expected result is Negative.  Fact Sheet for Patients: SugarRoll.be  Fact Sheet for Healthcare Providers: https://www.woods-mathews.com/  This test is not yet  approved or cleared by the [place] FDA and  has been authorized for detection and/or diagnosis of SARS-CoV-2 by FDA under an Emergency Use Authorization (EUA). This EUA will remain  in effect (meaning this test can be used) for the duration of the COVID-19 declaration under Se ction 564(b)(1) of the Act, 21 U.S.C. section 360bbb-3(b)(1), unless the authorization is terminated or revoked sooner.  Performed at [facility], [place] [address] [place], [place] [address]    No results found.  Review of Systems  Constitutional: Negative.   HENT: Negative.   Eyes: Negative.   Respiratory: Negative.   Cardiovascular: Negative.   Gastrointestinal: Negative.   Endocrine: Negative.   Genitourinary: Negative.   Musculoskeletal: Negative.   Skin: Negative.   Allergic/Immunologic: Negative.   Neurological: Negative.   Hematological: Negative.   Psychiatric/Behavioral: Negative.     Blood pressure 122/78, temperature 98.3 F (36.8 C), temperature source Oral, resp. rate 16, SpO2 100 %. Physical Exam  GENERAL: The patient is AO x3, in no acute distress. HEENT: Head is normocephalic and atraumatic. EOMI are intact. Mouth is well hydrated and without lesions. NECK: Supple. No masses LUNGS: Clear to auscultation. No presence of rhonchi/wheezing/rales. Adequate chest expansion HEART: RRR, normal s1 and s2. ABDOMEN: Soft, nontender, no guarding, no peritoneal signs, and nondistended. BS +. No masses. EXTREMITIES: Without any cyanosis, clubbing, rash, lesions or edema. NEUROLOGIC: AOx3, no focal motor deficit. 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  The patient is at average risk for colorectal cancer.  We will proceed with colonoscopy today.  Harvel Quale, MD 08/23/2020, 8:05 AM

## 2020-08-23 NOTE — Anesthesia Preprocedure Evaluation (Signed)
Anesthesia Evaluation  Patient identified by MRN, date of birth, ID band Patient awake    Reviewed: Allergy & Precautions, NPO status , Patient's Chart, lab work & pertinent test results  History of Anesthesia Complications Negative for: history of anesthetic complications  Airway Mallampati: II  TM Distance: >3 FB Neck ROM: Full    Dental  (+) Dental Advisory Given, Teeth Intact   Pulmonary former smoker,    Pulmonary exam normal breath sounds clear to auscultation       Cardiovascular Exercise Tolerance: Good Normal cardiovascular exam Rhythm:Regular Rate:Normal     Neuro/Psych  Headaches,    GI/Hepatic negative GI ROS, Neg liver ROS,   Endo/Other  Hypothyroidism   Renal/GU negative Renal ROS     Musculoskeletal  (+) Arthritis , Osteoarthritis,    Abdominal   Peds  Hematology  (+) anemia ,   Anesthesia Other Findings   Reproductive/Obstetrics                             Anesthesia Physical Anesthesia Plan  ASA: II  Anesthesia Plan: General   Post-op Pain Management:    Induction: Intravenous  PONV Risk Score and Plan: TIVA  Airway Management Planned: Nasal Cannula and Natural Airway  Additional Equipment:   Intra-op Plan:   Post-operative Plan:   Informed Consent: I have reviewed the patients History and Physical, chart, labs and discussed the procedure including the risks, benefits and alternatives for the proposed anesthesia with the patient or authorized representative who has indicated his/her understanding and acceptance.     Dental advisory given  Plan Discussed with: CRNA and Surgeon  Anesthesia Plan Comments:         Anesthesia Quick Evaluation

## 2020-08-24 LAB — SURGICAL PATHOLOGY

## 2020-08-29 ENCOUNTER — Encounter (HOSPITAL_COMMUNITY): Payer: Self-pay | Admitting: Gastroenterology

## 2020-09-06 ENCOUNTER — Other Ambulatory Visit: Payer: Self-pay | Admitting: Hematology

## 2020-09-06 MED FILL — Dexamethasone Sodium Phosphate Inj 100 MG/10ML: INTRAMUSCULAR | Qty: 2 | Status: AC

## 2020-09-07 ENCOUNTER — Inpatient Hospital Stay: Payer: Medicare Other

## 2020-09-07 ENCOUNTER — Other Ambulatory Visit: Payer: Self-pay

## 2020-09-07 ENCOUNTER — Inpatient Hospital Stay: Payer: Medicare Other | Attending: Hematology

## 2020-09-07 VITALS — BP 128/78 | HR 78 | Temp 98.2°F | Resp 18

## 2020-09-07 DIAGNOSIS — Z5112 Encounter for antineoplastic immunotherapy: Secondary | ICD-10-CM | POA: Diagnosis not present

## 2020-09-07 DIAGNOSIS — D702 Other drug-induced agranulocytosis: Secondary | ICD-10-CM

## 2020-09-07 DIAGNOSIS — C9000 Multiple myeloma not having achieved remission: Secondary | ICD-10-CM | POA: Diagnosis not present

## 2020-09-07 DIAGNOSIS — Z5111 Encounter for antineoplastic chemotherapy: Secondary | ICD-10-CM

## 2020-09-07 DIAGNOSIS — Z7189 Other specified counseling: Secondary | ICD-10-CM

## 2020-09-07 LAB — CBC WITH DIFFERENTIAL/PLATELET
Abs Immature Granulocytes: 0 10*3/uL (ref 0.00–0.07)
Basophils Absolute: 0 10*3/uL (ref 0.0–0.1)
Basophils Relative: 0 %
Eosinophils Absolute: 0.1 10*3/uL (ref 0.0–0.5)
Eosinophils Relative: 2 %
HCT: 32 % — ABNORMAL LOW (ref 36.0–46.0)
Hemoglobin: 10.4 g/dL — ABNORMAL LOW (ref 12.0–15.0)
Immature Granulocytes: 0 %
Lymphocytes Relative: 32 %
Lymphs Abs: 1.7 10*3/uL (ref 0.7–4.0)
MCH: 30.4 pg (ref 26.0–34.0)
MCHC: 32.5 g/dL (ref 30.0–36.0)
MCV: 93.6 fL (ref 80.0–100.0)
Monocytes Absolute: 0.5 10*3/uL (ref 0.1–1.0)
Monocytes Relative: 9 %
Neutro Abs: 3 10*3/uL (ref 1.7–7.7)
Neutrophils Relative %: 57 %
Platelets: 164 10*3/uL (ref 150–400)
RBC: 3.42 MIL/uL — ABNORMAL LOW (ref 3.87–5.11)
RDW: 14.6 % (ref 11.5–15.5)
WBC: 5.3 10*3/uL (ref 4.0–10.5)
nRBC: 0 % (ref 0.0–0.2)

## 2020-09-07 LAB — CMP (CANCER CENTER ONLY)
ALT: 19 U/L (ref 0–44)
AST: 21 U/L (ref 15–41)
Albumin: 3.9 g/dL (ref 3.5–5.0)
Alkaline Phosphatase: 70 U/L (ref 38–126)
Anion gap: 6 (ref 5–15)
BUN: 17 mg/dL (ref 8–23)
CO2: 22 mmol/L (ref 22–32)
Calcium: 8.6 mg/dL — ABNORMAL LOW (ref 8.9–10.3)
Chloride: 113 mmol/L — ABNORMAL HIGH (ref 98–111)
Creatinine: 0.8 mg/dL (ref 0.44–1.00)
GFR, Estimated: >60 mL/min (ref >60)
Glucose, Bld: 81 mg/dL (ref 70–99)
Potassium: 3.7 mmol/L (ref 3.5–5.1)
Sodium: 141 mmol/L (ref 135–145)
Total Bilirubin: 1 mg/dL (ref 0.3–1.2)
Total Protein: 6.3 g/dL — ABNORMAL LOW (ref 6.5–8.1)

## 2020-09-07 MED ORDER — SODIUM CHLORIDE 0.9 % IV SOLN
Freq: Once | INTRAVENOUS | Status: AC
  Filled 2020-09-07: qty 250

## 2020-09-07 MED ORDER — PROCHLORPERAZINE MALEATE 10 MG PO TABS
ORAL_TABLET | ORAL | Status: AC
  Filled 2020-09-07: qty 1

## 2020-09-07 MED ORDER — HEPARIN SOD (PORK) LOCK FLUSH 100 UNIT/ML IV SOLN
500.0000 [IU] | Freq: Once | INTRAVENOUS | Status: AC | PRN
  Administered 2020-09-07: 500 [IU]
  Filled 2020-09-07: qty 5

## 2020-09-07 MED ORDER — ACETAMINOPHEN 500 MG PO TABS
ORAL_TABLET | ORAL | Status: AC
  Filled 2020-09-07: qty 2

## 2020-09-07 MED ORDER — ACETAMINOPHEN 500 MG PO TABS
1000.0000 mg | ORAL_TABLET | Freq: Once | ORAL | Status: AC
  Administered 2020-09-07: 1000 mg via ORAL

## 2020-09-07 MED ORDER — SODIUM CHLORIDE 0.9 % IV SOLN
Freq: Once | INTRAVENOUS | Status: DC
  Filled 2020-09-07: qty 250

## 2020-09-07 MED ORDER — PROCHLORPERAZINE MALEATE 10 MG PO TABS
10.0000 mg | ORAL_TABLET | Freq: Once | ORAL | Status: AC
  Administered 2020-09-07: 10 mg via ORAL

## 2020-09-07 MED ORDER — SODIUM CHLORIDE 0.9 % IV SOLN
20.0000 mg | Freq: Once | INTRAVENOUS | Status: AC
  Administered 2020-09-07: 20 mg via INTRAVENOUS
  Filled 2020-09-07: qty 20

## 2020-09-07 MED ORDER — DEXTROSE 5 % IV SOLN
56.0000 mg/m2 | Freq: Once | INTRAVENOUS | Status: AC
  Administered 2020-09-07: 120 mg via INTRAVENOUS
  Filled 2020-09-07: qty 60

## 2020-09-07 MED ORDER — SODIUM CHLORIDE 0.9% FLUSH
10.0000 mL | INTRAVENOUS | Status: DC | PRN
  Administered 2020-09-07: 10 mL
  Filled 2020-09-07: qty 10

## 2020-09-07 NOTE — Patient Instructions (Signed)
[facility] Discharge Instructions for Patients Receiving Chemotherapy  Today you received the following chemotherapy agents: carfilzomib.  To help prevent nausea and vomiting after your treatment, we encourage you to take your nausea medication as directed.   If you develop nausea and vomiting that is not controlled by your nausea medication, call the clinic.   BELOW ARE SYMPTOMS THAT SHOULD BE REPORTED IMMEDIATELY:  *FEVER GREATER THAN 100.5 F  *CHILLS WITH OR WITHOUT FEVER  NAUSEA AND VOMITING THAT IS NOT CONTROLLED WITH YOUR NAUSEA MEDICATION  *UNUSUAL SHORTNESS OF BREATH  *UNUSUAL BRUISING OR BLEEDING  TENDERNESS IN MOUTH AND THROAT WITH OR WITHOUT PRESENCE OF ULCERS  *URINARY PROBLEMS  *BOWEL PROBLEMS  UNUSUAL RASH Items with * indicate a potential emergency and should be followed up as soon as possible.  Feel free to call the clinic should you have any questions or concerns. The clinic phone number is [phone].  Please show the [facility] at check-in to the Emergency Department and triage nurse.  Zoledronic Acid injection (Hypercalcemia, Oncology) What is this medicine? ZOLEDRONIC ACID (ZOE le dron ik AS id) lowers the amount of calcium loss from bone. It is used to treat too much calcium in your blood from cancer. It is also used to prevent complications of cancer that has spread to the bone. This medicine may be used for other purposes; ask your health care provider or pharmacist if you have questions. COMMON BRAND NAME(S): Zometa What should I tell my health care provider before I take this medicine? They need to know if you have any of these conditions: aspirin-sensitive asthma cancer, especially if you are receiving medicines used to treat cancer dental disease or wear dentures infection kidney disease receiving corticosteroids like dexamethasone or prednisone an unusual or allergic reaction to zoledronic acid, other medicines,  foods, dyes, or preservatives pregnant or trying to get pregnant breast-feeding How should I use this medicine? This medicine is for infusion into a vein. It is given by a health care professional in a hospital or clinic setting. Talk to your pediatrician regarding the use of this medicine in children. Special care may be needed. Overdosage: If you think you have taken too much of this medicine contact a poison control center or emergency room at once. NOTE: This medicine is only for you. Do not share this medicine with others. What if I miss a dose? It is important not to miss your dose. Call your doctor or health care professional if you are unable to keep an appointment. What may interact with this medicine? certain antibiotics given by injection NSAIDs, medicines for pain and inflammation, like ibuprofen or naproxen some diuretics like bumetanide, furosemide teriparatide thalidomide This list may not describe all possible interactions. Give your health care provider a list of all the medicines, herbs, non-prescription drugs, or dietary supplements you use. Also tell them if you smoke, drink alcohol, or use illegal drugs. Some items may interact with your medicine. What should I watch for while using this medicine? Visit your doctor or health care professional for regular checkups. It may be some time before you see the benefit from this medicine. Do not stop taking your medicine unless your doctor tells you to. Your doctor may order blood tests or other tests to see how you are doing. Women should inform their doctor if they wish to become pregnant or think they might be pregnant. There is a potential for serious side effects to an unborn child. Talk to  your health care professional or pharmacist for more information. You should make sure that you get enough calcium and vitamin D while you are taking this medicine. Discuss the foods you eat and the vitamins you take with your health care  professional. Some people who take this medicine have severe bone, joint, and/or muscle pain. This medicine may also increase your risk for jaw problems or a broken thigh bone. Tell your doctor right away if you have severe pain in your jaw, bones, joints, or muscles. Tell your doctor if you have any pain that does not go away or that gets worse. Tell your dentist and dental surgeon that you are taking this medicine. You should not have major dental surgery while on this medicine. See your dentist to have a dental exam and fix any dental problems before starting this medicine. Take good care of your teeth while on this medicine. Make sure you see your dentist for regular follow-up appointments. What side effects may I notice from receiving this medicine? Side effects that you should report to your doctor or health care professional as soon as possible: allergic reactions like skin rash, itching or hives, swelling of the face, lips, or tongue anxiety, confusion, or depression breathing problems changes in vision eye pain feeling faint or lightheaded, falls jaw pain, especially after dental work mouth sores muscle cramps, stiffness, or weakness redness, blistering, peeling or loosening of the skin, including inside the mouth trouble passing urine or change in the amount of urine Side effects that usually do not require medical attention (report to your doctor or health care professional if they continue or are bothersome): bone, joint, or muscle pain constipation diarrhea fever hair loss irritation at site where injected loss of appetite nausea, vomiting stomach upset trouble sleeping trouble swallowing weak or tired This list may not describe all possible side effects. Call your doctor for medical advice about side effects. You may report side effects to FDA at 1-800-FDA-1088. Where should I keep my medicine? This drug is given in a hospital or clinic and will not be stored at  home. NOTE: This sheet is a summary. It may not cover all possible information. If you have questions about this medicine, talk to your doctor, pharmacist, or health care provider.  2020 Elsevier/Gold Standard (2014-02-27 14:19:39)

## 2020-09-09 LAB — KAPPA/LAMBDA LIGHT CHAINS
Kappa free light chain: 11 mg/L (ref 3.3–19.4)
Kappa, lambda light chain ratio: 1.86 — ABNORMAL HIGH (ref 0.26–1.65)
Lambda free light chains: 5.9 mg/L (ref 5.7–26.3)

## 2020-09-12 LAB — MULTIPLE MYELOMA PANEL, SERUM
Albumin SerPl Elph-Mcnc: 3.9 g/dL (ref 2.9–4.4)
Albumin/Glob SerPl: 1.8 — ABNORMAL HIGH (ref 0.7–1.7)
Alpha 1: 0.2 g/dL (ref 0.0–0.4)
Alpha2 Glob SerPl Elph-Mcnc: 0.6 g/dL (ref 0.4–1.0)
B-Globulin SerPl Elph-Mcnc: 0.9 g/dL (ref 0.7–1.3)
Gamma Glob SerPl Elph-Mcnc: 0.5 g/dL (ref 0.4–1.8)
Globulin, Total: 2.2 g/dL (ref 2.2–3.9)
IgA: 17 mg/dL — ABNORMAL LOW (ref 87–352)
IgG (Immunoglobin G), Serum: 584 mg/dL — ABNORMAL LOW (ref 586–1602)
IgM (Immunoglobulin M), Srm: 21 mg/dL — ABNORMAL LOW (ref 26–217)
Total Protein ELP: 6.1 g/dL (ref 6.0–8.5)

## 2020-09-15 DIAGNOSIS — Z885 Allergy status to narcotic agent status: Secondary | ICD-10-CM | POA: Diagnosis not present

## 2020-09-15 DIAGNOSIS — Z9884 Bariatric surgery status: Secondary | ICD-10-CM | POA: Diagnosis not present

## 2020-09-15 DIAGNOSIS — Z79899 Other long term (current) drug therapy: Secondary | ICD-10-CM | POA: Diagnosis not present

## 2020-09-15 DIAGNOSIS — R030 Elevated blood-pressure reading, without diagnosis of hypertension: Secondary | ICD-10-CM | POA: Diagnosis not present

## 2020-09-15 DIAGNOSIS — Z7983 Long term (current) use of bisphosphonates: Secondary | ICD-10-CM | POA: Diagnosis not present

## 2020-09-15 DIAGNOSIS — Z9484 Stem cells transplant status: Secondary | ICD-10-CM | POA: Diagnosis not present

## 2020-09-15 DIAGNOSIS — Z8249 Family history of ischemic heart disease and other diseases of the circulatory system: Secondary | ICD-10-CM | POA: Diagnosis not present

## 2020-09-15 DIAGNOSIS — M545 Low back pain, unspecified: Secondary | ICD-10-CM | POA: Diagnosis not present

## 2020-09-15 DIAGNOSIS — R519 Headache, unspecified: Secondary | ICD-10-CM | POA: Diagnosis not present

## 2020-09-15 DIAGNOSIS — C9000 Multiple myeloma not having achieved remission: Secondary | ICD-10-CM | POA: Diagnosis not present

## 2020-09-15 DIAGNOSIS — M549 Dorsalgia, unspecified: Secondary | ICD-10-CM | POA: Diagnosis not present

## 2020-09-15 DIAGNOSIS — C9001 Multiple myeloma in remission: Secondary | ICD-10-CM | POA: Diagnosis not present

## 2020-09-15 DIAGNOSIS — Z791 Long term (current) use of non-steroidal anti-inflammatories (NSAID): Secondary | ICD-10-CM | POA: Diagnosis not present

## 2020-09-15 DIAGNOSIS — Z23 Encounter for immunization: Secondary | ICD-10-CM | POA: Diagnosis not present

## 2020-09-15 DIAGNOSIS — Z87891 Personal history of nicotine dependence: Secondary | ICD-10-CM | POA: Diagnosis not present

## 2020-09-21 ENCOUNTER — Inpatient Hospital Stay: Payer: Medicare Other | Attending: Hematology

## 2020-09-21 ENCOUNTER — Inpatient Hospital Stay: Payer: Medicare Other

## 2020-09-21 ENCOUNTER — Other Ambulatory Visit: Payer: Self-pay

## 2020-09-21 VITALS — BP 147/74 | HR 81 | Temp 98.2°F | Resp 18 | Wt 191.5 lb

## 2020-09-21 DIAGNOSIS — Z5111 Encounter for antineoplastic chemotherapy: Secondary | ICD-10-CM

## 2020-09-21 DIAGNOSIS — C9000 Multiple myeloma not having achieved remission: Secondary | ICD-10-CM

## 2020-09-21 DIAGNOSIS — Z7189 Other specified counseling: Secondary | ICD-10-CM

## 2020-09-21 DIAGNOSIS — Z5112 Encounter for antineoplastic immunotherapy: Secondary | ICD-10-CM | POA: Diagnosis not present

## 2020-09-21 LAB — CBC WITH DIFFERENTIAL/PLATELET
Abs Immature Granulocytes: 0.01 10*3/uL (ref 0.00–0.07)
Basophils Absolute: 0 10*3/uL (ref 0.0–0.1)
Basophils Relative: 1 %
Eosinophils Absolute: 0.1 10*3/uL (ref 0.0–0.5)
Eosinophils Relative: 3 %
HCT: 30.4 % — ABNORMAL LOW (ref 36.0–46.0)
Hemoglobin: 10 g/dL — ABNORMAL LOW (ref 12.0–15.0)
Immature Granulocytes: 0 %
Lymphocytes Relative: 41 %
Lymphs Abs: 1.5 10*3/uL (ref 0.7–4.0)
MCH: 30.2 pg (ref 26.0–34.0)
MCHC: 32.9 g/dL (ref 30.0–36.0)
MCV: 91.8 fL (ref 80.0–100.0)
Monocytes Absolute: 0.4 10*3/uL (ref 0.1–1.0)
Monocytes Relative: 11 %
Neutro Abs: 1.7 10*3/uL (ref 1.7–7.7)
Neutrophils Relative %: 44 %
Platelets: 209 10*3/uL (ref 150–400)
RBC: 3.31 MIL/uL — ABNORMAL LOW (ref 3.87–5.11)
RDW: 15 % (ref 11.5–15.5)
WBC: 3.8 10*3/uL — ABNORMAL LOW (ref 4.0–10.5)
nRBC: 0 % (ref 0.0–0.2)

## 2020-09-21 LAB — CMP (CANCER CENTER ONLY)
ALT: 18 U/L (ref 0–44)
AST: 18 U/L (ref 15–41)
Albumin: 3.6 g/dL (ref 3.5–5.0)
Alkaline Phosphatase: 64 U/L (ref 38–126)
Anion gap: 9 (ref 5–15)
BUN: 12 mg/dL (ref 8–23)
CO2: 22 mmol/L (ref 22–32)
Calcium: 9.2 mg/dL (ref 8.9–10.3)
Chloride: 109 mmol/L (ref 98–111)
Creatinine: 0.85 mg/dL (ref 0.44–1.00)
GFR, Estimated: >60 mL/min (ref >60)
Glucose, Bld: 90 mg/dL (ref 70–99)
Potassium: 3.9 mmol/L (ref 3.5–5.1)
Sodium: 140 mmol/L (ref 135–145)
Total Bilirubin: 0.8 mg/dL (ref 0.3–1.2)
Total Protein: 6.1 g/dL — ABNORMAL LOW (ref 6.5–8.1)

## 2020-09-21 MED ORDER — ACETAMINOPHEN 500 MG PO TABS
1000.0000 mg | ORAL_TABLET | Freq: Once | ORAL | Status: AC
  Administered 2020-09-21: 1000 mg via ORAL

## 2020-09-21 MED ORDER — SODIUM CHLORIDE 0.9 % IV SOLN
20.0000 mg | Freq: Once | INTRAVENOUS | Status: AC
  Administered 2020-09-21: 20 mg via INTRAVENOUS
  Filled 2020-09-21: qty 20

## 2020-09-21 MED ORDER — SODIUM CHLORIDE 0.9% FLUSH
10.0000 mL | INTRAVENOUS | Status: DC | PRN
  Administered 2020-09-21: 10 mL
  Filled 2020-09-21: qty 10

## 2020-09-21 MED ORDER — SODIUM CHLORIDE 0.9 % IV SOLN
Freq: Once | INTRAVENOUS | Status: AC
  Filled 2020-09-21: qty 250

## 2020-09-21 MED ORDER — PROCHLORPERAZINE MALEATE 10 MG PO TABS
10.0000 mg | ORAL_TABLET | Freq: Once | ORAL | Status: AC
  Administered 2020-09-21: 10 mg via ORAL

## 2020-09-21 MED ORDER — HEPARIN SOD (PORK) LOCK FLUSH 100 UNIT/ML IV SOLN
500.0000 [IU] | Freq: Once | INTRAVENOUS | Status: AC | PRN
  Administered 2020-09-21: 500 [IU]
  Filled 2020-09-21: qty 5

## 2020-09-21 MED ORDER — ACETAMINOPHEN 500 MG PO TABS
ORAL_TABLET | ORAL | Status: AC
  Filled 2020-09-21: qty 2

## 2020-09-21 MED ORDER — DEXTROSE 5 % IV SOLN
56.0000 mg/m2 | Freq: Once | INTRAVENOUS | Status: AC
  Administered 2020-09-21: 120 mg via INTRAVENOUS
  Filled 2020-09-21: qty 60

## 2020-09-21 MED ORDER — PROCHLORPERAZINE MALEATE 10 MG PO TABS
ORAL_TABLET | ORAL | Status: AC
  Filled 2020-09-21: qty 1

## 2020-09-21 NOTE — Patient Instructions (Signed)
[facility] Discharge Instructions for Patients Receiving Chemotherapy  Today you received the following chemotherapy agents: carfilzomib.  To help prevent nausea and vomiting after your treatment, we encourage you to take your nausea medication as directed.   If you develop nausea and vomiting that is not controlled by your nausea medication, call the clinic.   BELOW ARE SYMPTOMS THAT SHOULD BE REPORTED IMMEDIATELY:  *FEVER GREATER THAN 100.5 F  *CHILLS WITH OR WITHOUT FEVER  NAUSEA AND VOMITING THAT IS NOT CONTROLLED WITH YOUR NAUSEA MEDICATION  *UNUSUAL SHORTNESS OF BREATH  *UNUSUAL BRUISING OR BLEEDING  TENDERNESS IN MOUTH AND THROAT WITH OR WITHOUT PRESENCE OF ULCERS  *URINARY PROBLEMS  *BOWEL PROBLEMS  UNUSUAL RASH Items with * indicate a potential emergency and should be followed up as soon as possible.  Feel free to call the clinic should you have any questions or concerns. The clinic phone number is [phone].  Please show the [facility] at check-in to the Emergency Department and triage nurse.  Zoledronic Acid injection (Hypercalcemia, Oncology) What is this medicine? ZOLEDRONIC ACID (ZOE le dron ik AS id) lowers the amount of calcium loss from bone. It is used to treat too much calcium in your blood from cancer. It is also used to prevent complications of cancer that has spread to the bone. This medicine may be used for other purposes; ask your health care provider or pharmacist if you have questions. COMMON BRAND NAME(S): Zometa What should I tell my health care provider before I take this medicine? They need to know if you have any of these conditions: aspirin-sensitive asthma cancer, especially if you are receiving medicines used to treat cancer dental disease or wear dentures infection kidney disease receiving corticosteroids like dexamethasone or prednisone an unusual or allergic reaction to zoledronic acid, other medicines,  foods, dyes, or preservatives pregnant or trying to get pregnant breast-feeding How should I use this medicine? This medicine is for infusion into a vein. It is given by a health care professional in a hospital or clinic setting. Talk to your pediatrician regarding the use of this medicine in children. Special care may be needed. Overdosage: If you think you have taken too much of this medicine contact a poison control center or emergency room at once. NOTE: This medicine is only for you. Do not share this medicine with others. What if I miss a dose? It is important not to miss your dose. Call your doctor or health care professional if you are unable to keep an appointment. What may interact with this medicine? certain antibiotics given by injection NSAIDs, medicines for pain and inflammation, like ibuprofen or naproxen some diuretics like bumetanide, furosemide teriparatide thalidomide This list may not describe all possible interactions. Give your health care provider a list of all the medicines, herbs, non-prescription drugs, or dietary supplements you use. Also tell them if you smoke, drink alcohol, or use illegal drugs. Some items may interact with your medicine. What should I watch for while using this medicine? Visit your doctor or health care professional for regular checkups. It may be some time before you see the benefit from this medicine. Do not stop taking your medicine unless your doctor tells you to. Your doctor may order blood tests or other tests to see how you are doing. Women should inform their doctor if they wish to become pregnant or think they might be pregnant. There is a potential for serious side effects to an unborn child. Talk to  your health care professional or pharmacist for more information. You should make sure that you get enough calcium and vitamin D while you are taking this medicine. Discuss the foods you eat and the vitamins you take with your health care  professional. Some people who take this medicine have severe bone, joint, and/or muscle pain. This medicine may also increase your risk for jaw problems or a broken thigh bone. Tell your doctor right away if you have severe pain in your jaw, bones, joints, or muscles. Tell your doctor if you have any pain that does not go away or that gets worse. Tell your dentist and dental surgeon that you are taking this medicine. You should not have major dental surgery while on this medicine. See your dentist to have a dental exam and fix any dental problems before starting this medicine. Take good care of your teeth while on this medicine. Make sure you see your dentist for regular follow-up appointments. What side effects may I notice from receiving this medicine? Side effects that you should report to your doctor or health care professional as soon as possible: allergic reactions like skin rash, itching or hives, swelling of the face, lips, or tongue anxiety, confusion, or depression breathing problems changes in vision eye pain feeling faint or lightheaded, falls jaw pain, especially after dental work mouth sores muscle cramps, stiffness, or weakness redness, blistering, peeling or loosening of the skin, including inside the mouth trouble passing urine or change in the amount of urine Side effects that usually do not require medical attention (report to your doctor or health care professional if they continue or are bothersome): bone, joint, or muscle pain constipation diarrhea fever hair loss irritation at site where injected loss of appetite nausea, vomiting stomach upset trouble sleeping trouble swallowing weak or tired This list may not describe all possible side effects. Call your doctor for medical advice about side effects. You may report side effects to FDA at 1-800-FDA-1088. Where should I keep my medicine? This drug is given in a hospital or clinic and will not be stored at  home. NOTE: This sheet is a summary. It may not cover all possible information. If you have questions about this medicine, talk to your doctor, pharmacist, or health care provider.  2020 Elsevier/Gold Standard (2014-02-27 14:19:39)

## 2020-09-28 ENCOUNTER — Encounter (INDEPENDENT_AMBULATORY_CARE_PROVIDER_SITE_OTHER): Payer: Self-pay | Admitting: *Deleted

## 2020-10-04 NOTE — Progress Notes (Signed)
HEMATOLOGY/ONCOLOGY CLINIC NOTE  Date of Service: 10/05/20     PCP: Marzetta Board MD  CHIEF COMPLAINTS/PURPOSE OF CONSULTATION:  Continued f/u for mx of myeloma   HISTORY OF PRESENTING ILLNESS:   Madison Parker is a wonderful 69 y.o. 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  Patient has a history of smoldering multiple myeloma which she reports she was diagnosed with in 2011 when she was evaluated by a hematologist in [place]. She reports that she presented with low blood counts (low WBC)   and after significant workup she had a bone marrow examination based on which she was told that she has smoldering multiple myeloma. Patient notes that she had a bone survey at that time which was negative. She was monitored there closely and then moved to [facility] in 2015 to stay with her son whose wife was being treated for breast cancer. Patient was following with Dr. Delight Hoh in [place]. She reports not having had any treatment for multiple myeloma.  Her last labs from about a year ago showed a SPEP with no OBSERVED M spike . Serum free light chains showed an elevation of kappa Free light chain 310 and lambda free light chain of 12.7 with an abnormal Kappa/ Lambda ratio of 24.38 ( up from 19 previously). Random urine showed an M protein complement of 44%.  She lives in [place] and requested transfer of care to [place]. She was offered follow-up but [place] in [place]  But prefers to  follow [place] in [place]. Patient reports her energy levels been stable. She has chronic back pain which has improved since the patient had her spinal surgery in April 2017 (L4-L5 interbody fusion). Patient notes she has had some chronic left hip pain which is somewhat more bothersome with the navy on the lateral aspect of her upper thighs that's painful. She also notes some right hip pain.  Over the last few weeks he notes some upper neck pain especially when bending her neck backwards and tingling numbness in her right upper extremity.  Has been working on losing some weight voluntarily.  No acute other new symptoms.  AUTOLOGOUS STEM CELL TRANSPLANT:  Disease status at time of transplant: sCR MRD positive  ASBMT risk stratification: low risk, ECOG/KPS: 0 / 90%, HSCT-CI score: 0  Started mobilization on 09/03/19 using GCS-F and Plerixafor. She collected stem cells on 09/07/19. The pre-processing total was 7.18x10(6) CD34+ cells/kg. The post-processing total was 1.11x10(7) CD34+ cells/kg frozen in 3 bags.  Preparative regimen with Melphalan 200 mg/m2 on 09/14/19 (outpatient).  Infusion of stem cells - 7.4 x 10(6) on 09/15/19 (outpatient)  Admitted Day +8 for neutropenic fever, uncontrolled n/v, diarrhea  Treated with short course of prednisone for peri-engraftment syndrome  TREATMENT:  Started induction therapy with carfilozomib, lenlaidomide, and dexamethasone 28 day cycle on 04/27/2019. She had a great response to initial cycle. She developed a DVT on 06/24/19 and was started on apixaban as an anticoagulant. She had normalization of her light chains by the start of C4 on 07/28/19.  She was referred to the myeloma clinic at [facility] for BMT consult and seen on 08/10/19. Serology from her visit showed no M-spike by SPEP, a free light chain ratio of 1.07 (kappa 11.42, lambda 10.65), and an abnormal beta-2-micro of 3.02. She was started on pre-transplant evaluation while undergoing C5 of therapy.  Treatment discontinued in 07/2019 in  preparation for autologous stem cell transplant.   INTERVAL HISTORY:   Shemekia Parker returns today for f/u of her multiple myeloma. She is s/p HDT -Auto HSCT on 09/15/2019. She is here for maintenance Carfilzomib. The patient's last visit with [place] was on 08/10/2020. The pt reports that she is doing well overall.  The pt reports that she has noticed  elevated blood pressures after recent treatments. Pt completed her one year follow up with Dr. Aris Lot at [facility]. She has a bone marrow biopsy scheduled for March of 2022. Pt has been eating well and has remained active. She does note joint stiffness but is still able to complete her regular activities. Pt recently had a dental check up. No dental caries identified.   Lab results today (10/05/20) of CBC w/diff and CMP is as follows: all values are WNL except for RBC at 3.47, Hgb at 10.6, HCT at 32.5, CO2 at 21, Calcium at 8.5, Total Protein at 6.3.  On review of systems, pt reports joint stiffness and denies back pain, new bone pain, dental pain and any other symptoms.   MEDICAL HISTORY:  Past Medical History:  Diagnosis Date  . Headache   . Low back pain   . SBO (small bowel obstruction) ([place]) 2010  . Smoldering multiple myeloma (HCC)   Previous history of hypothyroidism- not currently medications Anxiety Obesity .Body mass index is 35 kg/m. Vitamin D deficiency Smoldering multiple myeloma diagnosed in 2011 Lumbosacral radiculopathy Left hip pain  SURGICAL HISTORY: Past Surgical History:  Procedure Laterality Date  . ABDOMINAL HYSTERECTOMY     complete  . BACK SURGERY     lower at [facility]  . COLONOSCOPY WITH PROPOFOL N/A 08/23/2020   Procedure: COLONOSCOPY WITH PROPOFOL;  Surgeon: Harvel Quale, MD;  Location: [facility];  Service: Gastroenterology;  Laterality: N/A;  900  . colonscopy  2014  . GASTRIC BYPASS  yrs ago  . HERNIA REPAIR    . IR IMAGING GUIDED PORT INSERTION  04/20/2019  . KNEE SURGERY  06/04/2009   both knees replaced   . POLYPECTOMY  08/23/2020   Procedure: POLYPECTOMY;  Surgeon: Harvel Quale, MD;  Location: [facility];  Service: Gastroenterology;;  . TONSILLECTOMY  age 63  . TOTAL HIP ARTHROPLASTY Left 08/16/2017   Procedure: LEFT TOTAL HIP ARTHROPLASTY ANTERIOR APPROACH;  Surgeon: Dorna Leitz, MD;  Location: [facility];   Service: Orthopedics;  Laterality: Left;  Spinal surgery April 2017  SOCIAL HISTORY: Social History   Socioeconomic History  . Marital status: Married    Spouse name: Not on file  . Number of children: Not on file  . Years of education: Not on file  . Highest education level: Not on file  Occupational History  . Not on file  Tobacco Use  . Smoking status: Former Smoker    Packs/day: 0.50    Years: 10.00    Pack years: 5.00    Types: Cigarettes  . Smokeless tobacco: Never Used  . Tobacco comment: quit 1977  Vaping Use  . Vaping Use: Never used  Substance and Sexual Activity  . Alcohol use: Yes    Comment: occ  . Drug use: No  . Sexual activity: Not on file  Other Topics Concern  . Not on file  Social History Narrative  . Not on file   Social Determinants of Health   Financial Resource Strain: Not on file  Food Insecurity: Not on file  Transportation Needs: Not on file  Physical  Activity: Not on file  Stress: Not on file  Social Connections: Not on file  Intimate Partner Violence: Not on file  Occasional alcohol use Former smoker and smoked 1 pack per week for about 9 years has since quit.  FAMILY HISTORY: No family history on file.  ALLERGIES:  is allergic to codeine.  MEDICATIONS:  Current Outpatient Medications  Medication Sig Dispense Refill  . acyclovir (ZOVIRAX) 400 MG tablet Take 1 tablet (400 mg total) by mouth 2 (two) times daily. (Patient taking differently: Take 800 mg by mouth daily. ) 180 tablet 0  . FOLIC ACID PO Take 7,741 mg by mouth daily.     Marland Kitchen lidocaine-prilocaine (EMLA) cream Apply 1 application topically as needed. (Patient not taking: Reported on 08/17/2020) 30 g 2  . ondansetron (ZOFRAN) 8 MG tablet Take 1 tablet (8 mg total) by mouth 2 (two) times daily as needed (Nausea or vomiting). (Patient not taking: Reported on 08/17/2020) 30 tablet 1  . Probiotic Product (PROBIOTIC DAILY PO) Take 2 capsules by mouth daily. Gummies    .  prochlorperazine (COMPAZINE) 10 MG tablet Take 1 tablet (10 mg total) by mouth every 6 (six) hours as needed for nausea or vomiting. (Patient not taking: Reported on 08/17/2020) 30 tablet 0  . TURMERIC-GINGER PO Take 2 each by mouth daily.     . vitamin B-12 (CYANOCOBALAMIN) 1000 MCG tablet Take 1,000 mcg by mouth daily.     . Vitamin D, Ergocalciferol, (DRISDOL) 1.25 MG (50000 UNIT) CAPS capsule TAKE 1 CAPSULE ONCE A WEEK (Patient taking differently: Take 50,000 Units by mouth every 7 (seven) days. Sunday) 13 capsule 3   No current facility-administered medications for this visit.   Facility-Administered Medications Ordered in Other Visits  Medication Dose Route Frequency Provider Last Rate Last Admin  . sodium chloride flush (NS) 0.9 % injection 10 mL  10 mL Intracatheter PRN Brunetta Genera, MD   10 mL at 05/11/19 1437    REVIEW OF SYSTEMS:  A 10+ POINT REVIEW OF SYSTEMS WAS OBTAINED including neurology, dermatology, psychiatry, cardiac, respiratory, lymph, extremities, GI, GU, Musculoskeletal, constitutional, breasts, reproductive, HEENT.  All pertinent positives are noted in the HPI.  All others are negative.   PHYSICAL EXAMINATION: ECOG FS:1 - Symptomatic but completely ambulatory  Vitals:   10/05/20 0925  BP: (!) 166/84  Pulse: 79  Resp: 18  Temp: 98.1 F (36.7 C)  SpO2: 100%   Wt Readings from Last 3 Encounters:  10/05/20 197 lb 9.6 oz (89.6 kg)  09/21/20 191 lb 8 oz (86.9 kg)  08/10/20 191 lb 8 oz (86.9 kg)   Body mass index is 35 kg/m.    GENERAL:alert, in no acute distress and comfortable SKIN: no acute rashes, no significant lesions EYES: conjunctiva are pink and non-injected, sclera anicteric OROPHARYNX: MMM, no exudates, no oropharyngeal erythema or ulceration NECK: supple, no JVD LYMPH:  no palpable lymphadenopathy in the cervical, axillary or inguinal regions LUNGS: clear to auscultation b/l with normal respiratory effort HEART: regular rate &  rhythm ABDOMEN:  normoactive bowel sounds , non tender, not distended. No palpable hepatosplenomegaly.  Extremity: no pedal edema PSYCH: alert & oriented x 3 with fluent speech NEURO: no focal motor/sensory deficits  LABORATORY DATA:  I have reviewed the data as listed  . CBC Latest Ref Rng & Units 10/05/2020 09/21/2020 09/07/2020  WBC 4.0 - 10.5 K/uL 4.6 3.8(L) 5.3  Hemoglobin 12.0 - 15.0 g/dL 10.6(L) 10.0(L) 10.4(L)  Hematocrit 36.0 - 46.0 % 32.5(L) 30.4(L)  32.0(L)  Platelets 150 - 400 K/uL 175 209 164  ANC 1300 . CBC    Component Value Date/Time   WBC 4.6 10/05/2020 0907   RBC 3.47 (L) 10/05/2020 0907   HGB 10.6 (L) 10/05/2020 0907   HGB 11.3 (L) 11/12/2019 0836   HGB 11.3 (L) 09/30/2017 1145   HCT 32.5 (L) 10/05/2020 0907   HCT 35.1 09/30/2017 1145   PLT 175 10/05/2020 0907   PLT 149 (L) 11/12/2019 0836   PLT 172 09/30/2017 1145   MCV 93.7 10/05/2020 0907   MCV 97.5 09/30/2017 1145   MCH 30.5 10/05/2020 0907   MCHC 32.6 10/05/2020 0907   RDW 15.2 10/05/2020 0907   RDW 14.0 09/30/2017 1145   LYMPHSABS 1.7 10/05/2020 0907   LYMPHSABS 1.3 09/30/2017 1145   MONOABS 0.5 10/05/2020 0907   MONOABS 0.2 09/30/2017 1145   EOSABS 0.1 10/05/2020 0907   EOSABS 0.0 09/30/2017 1145   EOSABS 0.1 06/03/2014 1003   BASOSABS 0.0 10/05/2020 0907   BASOSABS 0.0 09/30/2017 1145     . CMP Latest Ref Rng & Units 10/05/2020 09/21/2020 09/07/2020  Glucose 70 - 99 mg/dL 85 90 81  BUN 8 - 23 mg/dL _0 Creatinine 0.44 - 1.00 mg/dL 0.75 0.85 0.80  Sodium 135 - 145 mmol/L 141 140 141  Potassium 3.5 - 5.1 mmol/L 3.8 3.9 3.7  Chloride 98 - 111 mmol/L 111 109 113(H)  CO2 22 - 32 mmol/L 21(L) 22 22  Calcium 8.9 - 10.3 mg/dL 8.5(L) 9.2 8.6(L)  Total Protein 6.5 - 8.1 g/dL 6.3(L) 6.1(L) 6.3(L)  Total Bilirubin 0.3 - 1.2 mg/dL 1.1 0.8 1.0  Alkaline Phos 38 - 126 U/L 61 64 70  AST 15 - 41 U/L _1 ALT 0 - 44 U/L _2 06/08/2020 K/L light chains:          Component     Latest Ref Rng & Units 11/08/2016  LDH     125 - 245 U/L 220  Beta 2     0.6 - 2.4 mg/L 2.5 (H)   Component     Latest Ref Rng & Units 07/30/2017  Ferritin     9 - 269 ng/ml 81  Vitamin B12     232 - 1,245 pg/mL 594   Magnesium: Component Magnesium  Latest Ref Rng & Units 1.7 - 2.4 mg/dL  10/02/2019 1.7  10/21/2019 1.7  11/12/2019 1.7    03/11/19 BM Bx:   03/11/19 Cytogenetics:            RADIOGRAPHIC STUDIES: I have personally reviewed the radiological images as listed and agreed with the findings in the report. No results found.   Bone survey 11/08/2016 IMPRESSION: 1. Small lytic lesion in the right scapula. 2. Possible small lytic lesion in the left scapula. 3. Postoperative changes. 4. Significant degenerative changes in both hips, left greater than right. 5. No evidence for acute fracture   Electronically Signed   By: Nolon Nations M.D.   On: 11/08/2016 16:19   ASSESSMENT & PLAN:   69 y.o. very pleasant lady with history of   1) Multiple myeloma (concern for small lytic lesions in left and right scapulae) - (appears light chain producing) and Progressive anemia  This was apparently diagnosed as smoldering myeloma in 2011 by her hematologist in [place].  No renal failure/hypercalcemia.  Bone survey with concern for possible small lytic lesions in B/L scapulae. PET/CT scan on 03/12/2017 showed  no concerning bone lesions.  Initial Bm Bx with 17% kappa restricted plasma cells consistent with plasma cell neoplasm.  No treatment so far.  03/11/19 BM Bx revealed involvement by 50% plasma cells; genetics -high risk t(14;16), previous genetics from April 2018 BM Bx were standard risk  03/16/19 MRI Lumbar reveals several explanations for her lower back pain including L2-L3 stenosis, but reassuringly, there was not evidence of involvement by multiple myeloma  04/01/19 PET/CT which revealed no FDG evidence of active multiple  myeloma within the skeleton. No evidence of lytic lesions within the skeleton or soft tissue Plasmacytoma.  05/07/2019 DXA scan revealed osteopenia, T-score of -1.2.  2) Chronic leukopenia - ? Related to SMM vs other nutritional deficiencies (h/o gastric bypass surgery put her at risk for nutritional deficiencies).  WBC counts of have been close to normal recently today at 3.8k with an [place] of 1600.  B12 levels WNL  Ferritin adequate  ?additional factor -recent NSAID use.  ?element of benign ethnic neutropenia.  Has not had any issues with frequent infection.     4) Neuropathy/radiculopathy -Continue follow up with orthopedics  5) Superficial Venous Thrombosis of the left small saphenous vein  06/24/2019 US venous left : Right: There is no evidence of deep vein thrombosis in the lower extremity. No cystic structure found in the popliteal fossa. Left: Findings consistent with acute superficial vein thrombosis involving the left small saphenous vein. There is no evidence of deep vein thrombosis in the lower extremity. However, portions of this examination were limited- see technologist comments above. No cystic structure found in the popliteal fossa  PLAN: -Discussed pt labwork today, 10/05/20; Hgb is holding well, other blood counts are nml, blood chemistries are stable overall. -Discussed 09/07/20 K/L light chain ratio down to 1.86 & no M Protein observed. No evidence of Multiple Myeloma progression. -The pt has no prohibitive toxicities from continuing C12D15 Carfilzomib  at this time. Will continue Carflizomib at 56 mg/m.  -Recommend pt monitor blood pressures at home, in the morning prior to food, drink, or activity.  -Recommend pt contact us or PCP if blood pressures remain elevated. -Recommend pt f/u for BM Bx at [facility] as scheduled. -Continue Zometa q12 weeks  -Will see back in 2 months with labs   FOLLOW UP: Please schedule next 3 cycles (6 doses) Of  Carfilzomib  Portflush and labs with each appointment MD visit in 8 weeks Plz schedule zometa q12weeks - 3 doses   The total time spent in the appt was 30 minutes and more than 50% was on counseling and direct patient cares, ordering and management of chemotherapy  All of the patient's questions were answered with apparent satisfaction. The patient knows to call the clinic with any problems, questions or concerns.    Sullivan Lone MD [place] [facility] Hematology/Oncology Physician [facility]  (Office):       [phone] (Work cell):  [phone] (Fax):           [phone]  I, Yevette Edwards, am acting as a scribe for Dr. Sullivan Lone.   .I have reviewed the above documentation for accuracy and completeness, and I agree with the above. Brunetta Genera MD

## 2020-10-05 ENCOUNTER — Inpatient Hospital Stay: Payer: Medicare Other

## 2020-10-05 ENCOUNTER — Inpatient Hospital Stay (HOSPITAL_BASED_OUTPATIENT_CLINIC_OR_DEPARTMENT_OTHER): Payer: Medicare Other | Admitting: Hematology

## 2020-10-05 ENCOUNTER — Other Ambulatory Visit: Payer: Self-pay

## 2020-10-05 VITALS — BP 166/84 | HR 79 | Temp 98.1°F | Resp 18 | Ht 63.0 in | Wt 197.6 lb

## 2020-10-05 DIAGNOSIS — C9000 Multiple myeloma not having achieved remission: Secondary | ICD-10-CM | POA: Diagnosis not present

## 2020-10-05 DIAGNOSIS — Z23 Encounter for immunization: Secondary | ICD-10-CM

## 2020-10-05 DIAGNOSIS — Z5112 Encounter for antineoplastic immunotherapy: Secondary | ICD-10-CM | POA: Diagnosis not present

## 2020-10-05 DIAGNOSIS — Z95828 Presence of other vascular implants and grafts: Secondary | ICD-10-CM

## 2020-10-05 DIAGNOSIS — Z5111 Encounter for antineoplastic chemotherapy: Secondary | ICD-10-CM

## 2020-10-05 DIAGNOSIS — C9001 Multiple myeloma in remission: Secondary | ICD-10-CM

## 2020-10-05 DIAGNOSIS — Z7189 Other specified counseling: Secondary | ICD-10-CM

## 2020-10-05 DIAGNOSIS — D702 Other drug-induced agranulocytosis: Secondary | ICD-10-CM

## 2020-10-05 LAB — CMP (CANCER CENTER ONLY)
ALT: 23 U/L (ref 0–44)
AST: 20 U/L (ref 15–41)
Albumin: 3.7 g/dL (ref 3.5–5.0)
Alkaline Phosphatase: 61 U/L (ref 38–126)
Anion gap: 9 (ref 5–15)
BUN: 11 mg/dL (ref 8–23)
CO2: 21 mmol/L — ABNORMAL LOW (ref 22–32)
Calcium: 8.5 mg/dL — ABNORMAL LOW (ref 8.9–10.3)
Chloride: 111 mmol/L (ref 98–111)
Creatinine: 0.75 mg/dL (ref 0.44–1.00)
GFR, Estimated: >60 mL/min (ref >60)
Glucose, Bld: 85 mg/dL (ref 70–99)
Potassium: 3.8 mmol/L (ref 3.5–5.1)
Sodium: 141 mmol/L (ref 135–145)
Total Bilirubin: 1.1 mg/dL (ref 0.3–1.2)
Total Protein: 6.3 g/dL — ABNORMAL LOW (ref 6.5–8.1)

## 2020-10-05 LAB — CBC WITH DIFFERENTIAL/PLATELET
Abs Immature Granulocytes: 0.01 10*3/uL (ref 0.00–0.07)
Basophils Absolute: 0 10*3/uL (ref 0.0–0.1)
Basophils Relative: 1 %
Eosinophils Absolute: 0.1 10*3/uL (ref 0.0–0.5)
Eosinophils Relative: 2 %
HCT: 32.5 % — ABNORMAL LOW (ref 36.0–46.0)
Hemoglobin: 10.6 g/dL — ABNORMAL LOW (ref 12.0–15.0)
Immature Granulocytes: 0 %
Lymphocytes Relative: 36 %
Lymphs Abs: 1.7 10*3/uL (ref 0.7–4.0)
MCH: 30.5 pg (ref 26.0–34.0)
MCHC: 32.6 g/dL (ref 30.0–36.0)
MCV: 93.7 fL (ref 80.0–100.0)
Monocytes Absolute: 0.5 10*3/uL (ref 0.1–1.0)
Monocytes Relative: 10 %
Neutro Abs: 2.4 10*3/uL (ref 1.7–7.7)
Neutrophils Relative %: 51 %
Platelets: 175 10*3/uL (ref 150–400)
RBC: 3.47 MIL/uL — ABNORMAL LOW (ref 3.87–5.11)
RDW: 15.2 % (ref 11.5–15.5)
WBC: 4.6 10*3/uL (ref 4.0–10.5)
nRBC: 0 % (ref 0.0–0.2)

## 2020-10-05 MED ORDER — ACETAMINOPHEN 500 MG PO TABS
ORAL_TABLET | ORAL | Status: AC
  Filled 2020-10-05: qty 2

## 2020-10-05 MED ORDER — PROCHLORPERAZINE MALEATE 10 MG PO TABS
10.0000 mg | ORAL_TABLET | Freq: Once | ORAL | Status: AC
  Administered 2020-10-05: 10:00:00 10 mg via ORAL

## 2020-10-05 MED ORDER — SODIUM CHLORIDE 0.9 % IV SOLN
20.0000 mg | Freq: Once | INTRAVENOUS | Status: AC
  Administered 2020-10-05: 10:00:00 20 mg via INTRAVENOUS
  Filled 2020-10-05: qty 20

## 2020-10-05 MED ORDER — DEXTROSE 5 % IV SOLN
56.0000 mg/m2 | Freq: Once | INTRAVENOUS | Status: AC
  Administered 2020-10-05: 11:00:00 120 mg via INTRAVENOUS
  Filled 2020-10-05: qty 60

## 2020-10-05 MED ORDER — SODIUM CHLORIDE 0.9 % IV SOLN
Freq: Once | INTRAVENOUS | Status: AC
  Filled 2020-10-05: qty 250

## 2020-10-05 MED ORDER — ACETAMINOPHEN 500 MG PO TABS
1000.0000 mg | ORAL_TABLET | Freq: Once | ORAL | Status: AC
  Administered 2020-10-05: 10:00:00 1000 mg via ORAL

## 2020-10-05 MED ORDER — HEPARIN SOD (PORK) LOCK FLUSH 100 UNIT/ML IV SOLN
500.0000 [IU] | Freq: Once | INTRAVENOUS | Status: AC | PRN
  Administered 2020-10-05: 12:00:00 500 [IU]
  Filled 2020-10-05: qty 5

## 2020-10-05 MED ORDER — SODIUM CHLORIDE 0.9% FLUSH
10.0000 mL | INTRAVENOUS | Status: DC | PRN
  Administered 2020-10-05: 12:00:00 10 mL
  Filled 2020-10-05: qty 10

## 2020-10-05 MED ORDER — SODIUM CHLORIDE 0.9% FLUSH
10.0000 mL | Freq: Once | INTRAVENOUS | Status: AC
  Administered 2020-10-05: 09:00:00 10 mL
  Filled 2020-10-05: qty 10

## 2020-10-05 MED ORDER — PROCHLORPERAZINE MALEATE 10 MG PO TABS
ORAL_TABLET | ORAL | Status: AC
  Filled 2020-10-05: qty 1

## 2020-10-05 NOTE — Patient Instructions (Signed)
[facility] Discharge Instructions for Patients Receiving Chemotherapy  Today you received the following chemotherapy agents: carfilzomib.  To help prevent nausea and vomiting after your treatment, we encourage you to take your nausea medication as directed.   If you develop nausea and vomiting that is not controlled by your nausea medication, call the clinic.   BELOW ARE SYMPTOMS THAT SHOULD BE REPORTED IMMEDIATELY:  *FEVER GREATER THAN 100.5 F  *CHILLS WITH OR WITHOUT FEVER  NAUSEA AND VOMITING THAT IS NOT CONTROLLED WITH YOUR NAUSEA MEDICATION  *UNUSUAL SHORTNESS OF BREATH  *UNUSUAL BRUISING OR BLEEDING  TENDERNESS IN MOUTH AND THROAT WITH OR WITHOUT PRESENCE OF ULCERS  *URINARY PROBLEMS  *BOWEL PROBLEMS  UNUSUAL RASH Items with * indicate a potential emergency and should be followed up as soon as possible.  Feel free to call the clinic should you have any questions or concerns. The clinic phone number is [phone].  Please show the CHEMO ALERT CARD at check-in to the Emergency Department and triage nurse.   

## 2020-10-13 ENCOUNTER — Telehealth: Payer: Self-pay | Admitting: Hematology

## 2020-10-13 NOTE — Telephone Encounter (Signed)
Scheduled per 12/22 los, patient has been called and notified. 

## 2020-10-19 ENCOUNTER — Inpatient Hospital Stay: Payer: Medicare Other | Attending: Hematology

## 2020-10-19 ENCOUNTER — Inpatient Hospital Stay: Payer: Medicare Other

## 2020-10-19 ENCOUNTER — Other Ambulatory Visit: Payer: Self-pay

## 2020-10-19 VITALS — BP 136/86 | HR 69 | Temp 97.9°F | Resp 18 | Wt 199.0 lb

## 2020-10-19 DIAGNOSIS — Z5112 Encounter for antineoplastic immunotherapy: Secondary | ICD-10-CM | POA: Insufficient documentation

## 2020-10-19 DIAGNOSIS — Z7189 Other specified counseling: Secondary | ICD-10-CM

## 2020-10-19 DIAGNOSIS — Z23 Encounter for immunization: Secondary | ICD-10-CM

## 2020-10-19 DIAGNOSIS — C9001 Multiple myeloma in remission: Secondary | ICD-10-CM

## 2020-10-19 DIAGNOSIS — C9000 Multiple myeloma not having achieved remission: Secondary | ICD-10-CM

## 2020-10-19 LAB — CBC WITH DIFFERENTIAL/PLATELET
Abs Immature Granulocytes: 0 10*3/uL (ref 0.00–0.07)
Basophils Absolute: 0 10*3/uL (ref 0.0–0.1)
Basophils Relative: 1 %
Eosinophils Absolute: 0 10*3/uL (ref 0.0–0.5)
Eosinophils Relative: 1 %
HCT: 31.4 % — ABNORMAL LOW (ref 36.0–46.0)
Hemoglobin: 10.4 g/dL — ABNORMAL LOW (ref 12.0–15.0)
Immature Granulocytes: 0 %
Lymphocytes Relative: 46 %
Lymphs Abs: 1.3 10*3/uL (ref 0.7–4.0)
MCH: 31.1 pg (ref 26.0–34.0)
MCHC: 33.1 g/dL (ref 30.0–36.0)
MCV: 94 fL (ref 80.0–100.0)
Monocytes Absolute: 0.3 10*3/uL (ref 0.1–1.0)
Monocytes Relative: 11 %
Neutro Abs: 1.2 10*3/uL — ABNORMAL LOW (ref 1.7–7.7)
Neutrophils Relative %: 41 %
Platelets: 187 10*3/uL (ref 150–400)
RBC: 3.34 MIL/uL — ABNORMAL LOW (ref 3.87–5.11)
RDW: 14.5 % (ref 11.5–15.5)
WBC: 2.8 10*3/uL — ABNORMAL LOW (ref 4.0–10.5)
nRBC: 0 % (ref 0.0–0.2)

## 2020-10-19 LAB — CMP (CANCER CENTER ONLY)
ALT: 19 U/L (ref 0–44)
AST: 17 U/L (ref 15–41)
Albumin: 3.6 g/dL (ref 3.5–5.0)
Alkaline Phosphatase: 56 U/L (ref 38–126)
Anion gap: 7 (ref 5–15)
BUN: 11 mg/dL (ref 8–23)
CO2: 23 mmol/L (ref 22–32)
Calcium: 8.6 mg/dL — ABNORMAL LOW (ref 8.9–10.3)
Chloride: 112 mmol/L — ABNORMAL HIGH (ref 98–111)
Creatinine: 0.81 mg/dL (ref 0.44–1.00)
GFR, Estimated: >60 mL/min (ref >60)
Glucose, Bld: 94 mg/dL (ref 70–99)
Potassium: 3.8 mmol/L (ref 3.5–5.1)
Sodium: 142 mmol/L (ref 135–145)
Total Bilirubin: 1 mg/dL (ref 0.3–1.2)
Total Protein: 6.1 g/dL — ABNORMAL LOW (ref 6.5–8.1)

## 2020-10-19 MED ORDER — PROCHLORPERAZINE MALEATE 10 MG PO TABS
ORAL_TABLET | ORAL | Status: AC
  Filled 2020-10-19: qty 1

## 2020-10-19 MED ORDER — PROCHLORPERAZINE MALEATE 10 MG PO TABS
10.0000 mg | ORAL_TABLET | Freq: Once | ORAL | Status: AC
  Administered 2020-10-19: 10 mg via ORAL

## 2020-10-19 MED ORDER — DEXTROSE 5 % IV SOLN
56.0000 mg/m2 | Freq: Once | INTRAVENOUS | Status: AC
  Administered 2020-10-19: 120 mg via INTRAVENOUS
  Filled 2020-10-19: qty 60

## 2020-10-19 MED ORDER — ACETAMINOPHEN 500 MG PO TABS
ORAL_TABLET | ORAL | Status: AC
  Filled 2020-10-19: qty 2

## 2020-10-19 MED ORDER — ACETAMINOPHEN 500 MG PO TABS
1000.0000 mg | ORAL_TABLET | Freq: Once | ORAL | Status: AC
  Administered 2020-10-19: 1000 mg via ORAL

## 2020-10-19 MED ORDER — SODIUM CHLORIDE 0.9 % IV SOLN
Freq: Once | INTRAVENOUS | Status: AC
  Filled 2020-10-19: qty 250

## 2020-10-19 MED ORDER — SODIUM CHLORIDE 0.9 % IV SOLN
20.0000 mg | Freq: Once | INTRAVENOUS | Status: AC
  Administered 2020-10-19: 20 mg via INTRAVENOUS
  Filled 2020-10-19: qty 20

## 2020-10-19 MED ORDER — HEPARIN SOD (PORK) LOCK FLUSH 100 UNIT/ML IV SOLN
500.0000 [IU] | Freq: Once | INTRAVENOUS | Status: AC | PRN
Start: 2020-10-19 — End: 2020-10-19
  Administered 2020-10-19: 500 [IU]
  Filled 2020-10-19: qty 5

## 2020-10-19 MED ORDER — SODIUM CHLORIDE 0.9% FLUSH
10.0000 mL | INTRAVENOUS | Status: DC | PRN
  Administered 2020-10-19: 10 mL
  Filled 2020-10-19: qty 10

## 2020-10-19 NOTE — Progress Notes (Signed)
Per Dr. Candise Che, okay to proceed with treatment with ANC of 1.2.

## 2020-10-20 LAB — KAPPA/LAMBDA LIGHT CHAINS
Kappa free light chain: 8.6 mg/L (ref 3.3–19.4)
Kappa, lambda light chain ratio: 1.21 (ref 0.26–1.65)
Lambda free light chains: 7.1 mg/L (ref 5.7–26.3)

## 2020-10-21 LAB — MULTIPLE MYELOMA PANEL, SERUM
Albumin SerPl Elph-Mcnc: 3.6 g/dL (ref 2.9–4.4)
Albumin/Glob SerPl: 1.7 (ref 0.7–1.7)
Alpha 1: 0.2 g/dL (ref 0.0–0.4)
Alpha2 Glob SerPl Elph-Mcnc: 0.6 g/dL (ref 0.4–1.0)
B-Globulin SerPl Elph-Mcnc: 1 g/dL (ref 0.7–1.3)
Gamma Glob SerPl Elph-Mcnc: 0.5 g/dL (ref 0.4–1.8)
Globulin, Total: 2.2 g/dL (ref 2.2–3.9)
IgA: 15 mg/dL — ABNORMAL LOW (ref 87–352)
IgG (Immunoglobin G), Serum: 511 mg/dL — ABNORMAL LOW (ref 586–1602)
IgM (Immunoglobulin M), Srm: 22 mg/dL — ABNORMAL LOW (ref 26–217)
Total Protein ELP: 5.8 g/dL — ABNORMAL LOW (ref 6.0–8.5)

## 2020-11-02 ENCOUNTER — Inpatient Hospital Stay: Payer: Medicare Other

## 2020-11-02 ENCOUNTER — Ambulatory Visit: Payer: Medicare Other

## 2020-11-02 ENCOUNTER — Other Ambulatory Visit: Payer: Self-pay

## 2020-11-02 VITALS — BP 133/75 | HR 60 | Temp 98.6°F | Resp 18 | Wt 199.5 lb

## 2020-11-02 DIAGNOSIS — Z23 Encounter for immunization: Secondary | ICD-10-CM

## 2020-11-02 DIAGNOSIS — Z95828 Presence of other vascular implants and grafts: Secondary | ICD-10-CM

## 2020-11-02 DIAGNOSIS — C9000 Multiple myeloma not having achieved remission: Secondary | ICD-10-CM

## 2020-11-02 DIAGNOSIS — C9001 Multiple myeloma in remission: Secondary | ICD-10-CM

## 2020-11-02 DIAGNOSIS — Z5112 Encounter for antineoplastic immunotherapy: Secondary | ICD-10-CM | POA: Diagnosis not present

## 2020-11-02 DIAGNOSIS — Z7189 Other specified counseling: Secondary | ICD-10-CM

## 2020-11-02 LAB — CMP (CANCER CENTER ONLY)
ALT: 20 U/L (ref 0–44)
AST: 17 U/L (ref 15–41)
Albumin: 3.6 g/dL (ref 3.5–5.0)
Alkaline Phosphatase: 55 U/L (ref 38–126)
Anion gap: 6 (ref 5–15)
BUN: 14 mg/dL (ref 8–23)
CO2: 22 mmol/L (ref 22–32)
Calcium: 8.8 mg/dL — ABNORMAL LOW (ref 8.9–10.3)
Chloride: 113 mmol/L — ABNORMAL HIGH (ref 98–111)
Creatinine: 0.9 mg/dL (ref 0.44–1.00)
GFR, Estimated: >60 mL/min (ref >60)
Glucose, Bld: 95 mg/dL (ref 70–99)
Potassium: 4 mmol/L (ref 3.5–5.1)
Sodium: 141 mmol/L (ref 135–145)
Total Bilirubin: 0.9 mg/dL (ref 0.3–1.2)
Total Protein: 6.2 g/dL — ABNORMAL LOW (ref 6.5–8.1)

## 2020-11-02 LAB — CBC WITH DIFFERENTIAL/PLATELET
Abs Immature Granulocytes: 0.01 10*3/uL (ref 0.00–0.07)
Basophils Absolute: 0 10*3/uL (ref 0.0–0.1)
Basophils Relative: 1 %
Eosinophils Absolute: 0.1 10*3/uL (ref 0.0–0.5)
Eosinophils Relative: 2 %
HCT: 31.4 % — ABNORMAL LOW (ref 36.0–46.0)
Hemoglobin: 10.4 g/dL — ABNORMAL LOW (ref 12.0–15.0)
Immature Granulocytes: 0 %
Lymphocytes Relative: 44 %
Lymphs Abs: 1.5 10*3/uL (ref 0.7–4.0)
MCH: 31.1 pg (ref 26.0–34.0)
MCHC: 33.1 g/dL (ref 30.0–36.0)
MCV: 94 fL (ref 80.0–100.0)
Monocytes Absolute: 0.4 10*3/uL (ref 0.1–1.0)
Monocytes Relative: 11 %
Neutro Abs: 1.5 10*3/uL — ABNORMAL LOW (ref 1.7–7.7)
Neutrophils Relative %: 42 %
Platelets: 182 10*3/uL (ref 150–400)
RBC: 3.34 MIL/uL — ABNORMAL LOW (ref 3.87–5.11)
RDW: 14.4 % (ref 11.5–15.5)
WBC: 3.5 10*3/uL — ABNORMAL LOW (ref 4.0–10.5)
nRBC: 0 % (ref 0.0–0.2)

## 2020-11-02 MED ORDER — DEXTROSE 5 % IV SOLN
56.0000 mg/m2 | Freq: Once | INTRAVENOUS | Status: AC
  Administered 2020-11-02: 120 mg via INTRAVENOUS
  Filled 2020-11-02: qty 60

## 2020-11-02 MED ORDER — ACETAMINOPHEN 500 MG PO TABS
1000.0000 mg | ORAL_TABLET | Freq: Once | ORAL | Status: AC
  Administered 2020-11-02: 1000 mg via ORAL

## 2020-11-02 MED ORDER — ACETAMINOPHEN 500 MG PO TABS
ORAL_TABLET | ORAL | Status: AC
  Filled 2020-11-02: qty 2

## 2020-11-02 MED ORDER — ZOLEDRONIC ACID 4 MG/100ML IV SOLN
INTRAVENOUS | Status: AC
  Filled 2020-11-02: qty 100

## 2020-11-02 MED ORDER — HEPARIN SOD (PORK) LOCK FLUSH 100 UNIT/ML IV SOLN
500.0000 [IU] | Freq: Once | INTRAVENOUS | Status: AC | PRN
  Administered 2020-11-02: 500 [IU]
  Filled 2020-11-02: qty 5

## 2020-11-02 MED ORDER — ZOLEDRONIC ACID 4 MG/100ML IV SOLN
4.0000 mg | Freq: Once | INTRAVENOUS | Status: AC
  Administered 2020-11-02: 4 mg via INTRAVENOUS

## 2020-11-02 MED ORDER — SODIUM CHLORIDE 0.9 % IV SOLN
20.0000 mg | Freq: Once | INTRAVENOUS | Status: AC
  Administered 2020-11-02: 20 mg via INTRAVENOUS
  Filled 2020-11-02: qty 20

## 2020-11-02 MED ORDER — SODIUM CHLORIDE 0.9% FLUSH
10.0000 mL | INTRAVENOUS | Status: DC | PRN
Start: 2020-11-02 — End: 2020-11-02
  Administered 2020-11-02: 10 mL
  Filled 2020-11-02: qty 10

## 2020-11-02 MED ORDER — PROCHLORPERAZINE MALEATE 10 MG PO TABS
ORAL_TABLET | ORAL | Status: AC
  Filled 2020-11-02: qty 1

## 2020-11-02 MED ORDER — SODIUM CHLORIDE 0.9 % IV SOLN
Freq: Once | INTRAVENOUS | Status: AC
  Filled 2020-11-02: qty 250

## 2020-11-02 MED ORDER — PROCHLORPERAZINE MALEATE 10 MG PO TABS
10.0000 mg | ORAL_TABLET | Freq: Once | ORAL | Status: AC
  Administered 2020-11-02: 10 mg via ORAL

## 2020-11-02 NOTE — Patient Instructions (Signed)
[facility] Discharge Instructions for Patients Receiving Chemotherapy  Today you received the following chemotherapy agents: carfilzomib.  To help prevent nausea and vomiting after your treatment, we encourage you to take your nausea medication as directed.   If you develop nausea and vomiting that is not controlled by your nausea medication, call the clinic.   BELOW ARE SYMPTOMS THAT SHOULD BE REPORTED IMMEDIATELY:  *FEVER GREATER THAN 100.5 F  *CHILLS WITH OR WITHOUT FEVER  NAUSEA AND VOMITING THAT IS NOT CONTROLLED WITH YOUR NAUSEA MEDICATION  *UNUSUAL SHORTNESS OF BREATH  *UNUSUAL BRUISING OR BLEEDING  TENDERNESS IN MOUTH AND THROAT WITH OR WITHOUT PRESENCE OF ULCERS  *URINARY PROBLEMS  *BOWEL PROBLEMS  UNUSUAL RASH Items with * indicate a potential emergency and should be followed up as soon as possible.  Feel free to call the clinic should you have any questions or concerns. The clinic phone number is [phone].  Please show the [facility] at check-in to the Emergency Department and triage nurse.  Zoledronic Acid Injection (Hypercalcemia, Oncology) What is this medicine? ZOLEDRONIC ACID (ZOE le dron ik AS id) slows calcium loss from bones. It high calcium levels in the blood from some kinds of cancer. It may be used in other people at risk for bone loss. This medicine may be used for other purposes; ask your health care provider or pharmacist if you have questions. COMMON BRAND NAME(S): Zometa What should I tell my health care provider before I take this medicine? They need to know if you have any of these conditions:  cancer  dehydration  dental disease  kidney disease  liver disease  low levels of calcium in the blood  lung or breathing disease (asthma)  receiving steroids like dexamethasone or prednisone  an unusual or allergic reaction to zoledronic acid, other medicines, foods, dyes, or preservatives  pregnant or trying  to get pregnant  breast-feeding How should I use this medicine? This drug is injected into a vein. It is given by a health care provider in a hospital or clinic setting. Talk to your health care provider about the use of this drug in children. Special care may be needed. Overdosage: If you think you have taken too much of this medicine contact a poison control center or emergency room at once. NOTE: This medicine is only for you. Do not share this medicine with others. What if I miss a dose? Keep appointments for follow-up doses. It is important not to miss your dose. Call your health care provider if you are unable to keep an appointment. What may interact with this medicine?  certain antibiotics given by injection  NSAIDs, medicines for pain and inflammation, like ibuprofen or naproxen  some diuretics like bumetanide, furosemide  teriparatide  thalidomide This list may not describe all possible interactions. Give your health care provider a list of all the medicines, herbs, non-prescription drugs, or dietary supplements you use. Also tell them if you smoke, drink alcohol, or use illegal drugs. Some items may interact with your medicine. What should I watch for while using this medicine? Visit your health care provider for regular checks on your progress. It may be some time before you see the benefit from this drug. Some people who take this drug have severe bone, joint, or muscle pain. This drug may also increase your risk for jaw problems or a broken thigh bone. Tell your health care provider right away if you have severe pain in your jaw, bones, joints,  or muscles. Tell you health care provider if you have any pain that does not go away or that gets worse. Tell your dentist and dental surgeon that you are taking this drug. You should not have major dental surgery while on this drug. See your dentist to have a dental exam and fix any dental problems before starting this drug. Take good  care of your teeth while on this drug. Make sure you see your dentist for regular follow-up appointments. You should make sure you get enough calcium and vitamin D while you are taking this drug. Discuss the foods you eat and the vitamins you take with your health care provider. Check with your health care provider if you have severe diarrhea, nausea, and vomiting, or if you sweat a lot. The loss of too much body fluid may make it dangerous for you to take this drug. You may need blood work done while you are taking this drug. Do not become pregnant while taking this drug. Women should inform their health care provider if they wish to become pregnant or think they might be pregnant. There is potential for serious harm to an unborn child. Talk to your health care provider for more information. What side effects may I notice from receiving this medicine? Side effects that you should report to your doctor or health care provider as soon as possible:  allergic reactions (skin rash, itching or hives; swelling of the face, lips, or tongue)  bone pain  infection (fever, chills, cough, sore throat, pain or trouble passing urine)  jaw pain, especially after dental work  joint pain  kidney injury (trouble passing urine or change in the amount of urine)  low blood pressure (dizziness; feeling faint or lightheaded, falls; unusually weak or tired)  low calcium levels (fast heartbeat; muscle cramps or pain; pain, tingling, or numbness in the hands or feet; seizures)  low magnesium levels (fast, irregular heartbeat; muscle cramp or pain; muscle weakness; tremors; seizures)  low red blood cell counts (trouble breathing; feeling faint; lightheaded, falls; unusually weak or tired)  muscle pain  redness, blistering, peeling, or loosening of the skin, including inside the mouth  severe diarrhea  swelling of the ankles, feet, hands  trouble breathing Side effects that usually do not require medical  attention (report to your doctor or health care provider if they continue or are bothersome):  anxious  constipation  coughing  depressed mood  eye irritation, itching, or pain  fever  general ill feeling or flu-like symptoms  nausea  pain, redness, or irritation at site where injected  trouble sleeping This list may not describe all possible side effects. Call your doctor for medical advice about side effects. You may report side effects to FDA at 1-800-FDA-1088. Where should I keep my medicine? This drug is given in a hospital or clinic. It will not be stored at home. NOTE: This sheet is a summary. It may not cover all possible information. If you have questions about this medicine, talk to your doctor, pharmacist, or health care provider.  2021 Elsevier/Gold Standard (2019-07-16 09:13:00)

## 2020-11-16 ENCOUNTER — Inpatient Hospital Stay: Payer: Medicare Other | Attending: Hematology

## 2020-11-16 ENCOUNTER — Inpatient Hospital Stay: Payer: Medicare Other

## 2020-11-16 ENCOUNTER — Other Ambulatory Visit: Payer: Self-pay

## 2020-11-16 VITALS — BP 138/74 | HR 87 | Temp 98.4°F | Resp 18 | Wt 194.8 lb

## 2020-11-16 DIAGNOSIS — Z5112 Encounter for antineoplastic immunotherapy: Secondary | ICD-10-CM | POA: Insufficient documentation

## 2020-11-16 DIAGNOSIS — C9000 Multiple myeloma not having achieved remission: Secondary | ICD-10-CM

## 2020-11-16 DIAGNOSIS — Z86718 Personal history of other venous thrombosis and embolism: Secondary | ICD-10-CM | POA: Insufficient documentation

## 2020-11-16 DIAGNOSIS — E039 Hypothyroidism, unspecified: Secondary | ICD-10-CM | POA: Insufficient documentation

## 2020-11-16 DIAGNOSIS — Z5111 Encounter for antineoplastic chemotherapy: Secondary | ICD-10-CM

## 2020-11-16 DIAGNOSIS — D649 Anemia, unspecified: Secondary | ICD-10-CM

## 2020-11-16 DIAGNOSIS — Z7189 Other specified counseling: Secondary | ICD-10-CM

## 2020-11-16 DIAGNOSIS — Z95828 Presence of other vascular implants and grafts: Secondary | ICD-10-CM

## 2020-11-16 LAB — CMP (CANCER CENTER ONLY)
ALT: 20 U/L (ref 0–44)
AST: 19 U/L (ref 15–41)
Albumin: 4 g/dL (ref 3.5–5.0)
Alkaline Phosphatase: 56 U/L (ref 38–126)
Anion gap: 9 (ref 5–15)
BUN: 16 mg/dL (ref 8–23)
CO2: 22 mmol/L (ref 22–32)
Calcium: 9 mg/dL (ref 8.9–10.3)
Chloride: 109 mmol/L (ref 98–111)
Creatinine: 0.86 mg/dL (ref 0.44–1.00)
GFR, Estimated: >60 mL/min (ref >60)
Glucose, Bld: 80 mg/dL (ref 70–99)
Potassium: 4.3 mmol/L (ref 3.5–5.1)
Sodium: 140 mmol/L (ref 135–145)
Total Bilirubin: 1.3 mg/dL — ABNORMAL HIGH (ref 0.3–1.2)
Total Protein: 6.6 g/dL (ref 6.5–8.1)

## 2020-11-16 LAB — CBC WITH DIFFERENTIAL/PLATELET
Abs Immature Granulocytes: 0.01 10*3/uL (ref 0.00–0.07)
Basophils Absolute: 0 10*3/uL (ref 0.0–0.1)
Basophils Relative: 1 %
Eosinophils Absolute: 0 10*3/uL (ref 0.0–0.5)
Eosinophils Relative: 1 %
HCT: 34 % — ABNORMAL LOW (ref 36.0–46.0)
Hemoglobin: 11.3 g/dL — ABNORMAL LOW (ref 12.0–15.0)
Immature Granulocytes: 0 %
Lymphocytes Relative: 53 %
Lymphs Abs: 1.7 10*3/uL (ref 0.7–4.0)
MCH: 31.4 pg (ref 26.0–34.0)
MCHC: 33.2 g/dL (ref 30.0–36.0)
MCV: 94.4 fL (ref 80.0–100.0)
Monocytes Absolute: 0.4 10*3/uL (ref 0.1–1.0)
Monocytes Relative: 12 %
Neutro Abs: 1.1 10*3/uL — ABNORMAL LOW (ref 1.7–7.7)
Neutrophils Relative %: 33 %
Platelets: 201 10*3/uL (ref 150–400)
RBC: 3.6 MIL/uL — ABNORMAL LOW (ref 3.87–5.11)
RDW: 14.5 % (ref 11.5–15.5)
WBC: 3.3 10*3/uL — ABNORMAL LOW (ref 4.0–10.5)
nRBC: 0 % (ref 0.0–0.2)

## 2020-11-16 MED ORDER — ACETAMINOPHEN 500 MG PO TABS
ORAL_TABLET | ORAL | Status: AC
  Filled 2020-11-16: qty 2

## 2020-11-16 MED ORDER — HEPARIN SOD (PORK) LOCK FLUSH 100 UNIT/ML IV SOLN
500.0000 [IU] | Freq: Once | INTRAVENOUS | Status: AC | PRN
Start: 2020-11-16 — End: 2020-11-16
  Administered 2020-11-16: 500 [IU]
  Filled 2020-11-16: qty 5

## 2020-11-16 MED ORDER — PROCHLORPERAZINE MALEATE 10 MG PO TABS
ORAL_TABLET | ORAL | Status: AC
  Filled 2020-11-16: qty 1

## 2020-11-16 MED ORDER — SODIUM CHLORIDE 0.9% FLUSH
10.0000 mL | Freq: Once | INTRAVENOUS | Status: AC
Start: 2020-11-16 — End: 2020-11-16
  Administered 2020-11-16: 10 mL
  Filled 2020-11-16: qty 10

## 2020-11-16 MED ORDER — PROCHLORPERAZINE MALEATE 10 MG PO TABS
10.0000 mg | ORAL_TABLET | Freq: Once | ORAL | Status: AC
  Administered 2020-11-16: 10 mg via ORAL

## 2020-11-16 MED ORDER — ACETAMINOPHEN 500 MG PO TABS
1000.0000 mg | ORAL_TABLET | Freq: Once | ORAL | Status: AC
  Administered 2020-11-16: 1000 mg via ORAL

## 2020-11-16 MED ORDER — CARFILZOMIB CHEMO INJECTION 60 MG
56.0000 mg/m2 | Freq: Once | INTRAVENOUS | Status: AC
  Administered 2020-11-16: 120 mg via INTRAVENOUS
  Filled 2020-11-16: qty 60

## 2020-11-16 MED ORDER — SODIUM CHLORIDE 0.9 % IV SOLN
Freq: Once | INTRAVENOUS | Status: DC
  Filled 2020-11-16: qty 250

## 2020-11-16 MED ORDER — SODIUM CHLORIDE 0.9 % IV SOLN
Freq: Once | INTRAVENOUS | Status: AC
  Filled 2020-11-16: qty 250

## 2020-11-16 MED ORDER — SODIUM CHLORIDE 0.9 % IV SOLN
20.0000 mg | Freq: Once | INTRAVENOUS | Status: AC
  Administered 2020-11-16: 20 mg via INTRAVENOUS
  Filled 2020-11-16: qty 20

## 2020-11-16 MED ORDER — SODIUM CHLORIDE 0.9% FLUSH
10.0000 mL | INTRAVENOUS | Status: DC | PRN
  Administered 2020-11-16: 10 mL
  Filled 2020-11-16: qty 10

## 2020-11-16 NOTE — Patient Instructions (Signed)
[facility] Discharge Instructions for Patients Receiving Chemotherapy  Today you received the following chemotherapy agents carfilizolmab To help prevent nausea and vomiting after your treatment, we encourage you to take your nausea medication as directed    If you develop nausea and vomiting that is not controlled by your nausea medication, call the clinic.   BELOW ARE SYMPTOMS THAT SHOULD BE REPORTED IMMEDIATELY:  *FEVER GREATER THAN 100.5 F  *CHILLS WITH OR WITHOUT FEVER  NAUSEA AND VOMITING THAT IS NOT CONTROLLED WITH YOUR NAUSEA MEDICATION  *UNUSUAL SHORTNESS OF BREATH  *UNUSUAL BRUISING OR BLEEDING  TENDERNESS IN MOUTH AND THROAT WITH OR WITHOUT PRESENCE OF ULCERS  *URINARY PROBLEMS  *BOWEL PROBLEMS  UNUSUAL RASH Items with * indicate a potential emergency and should be followed up as soon as possible.  Feel free to call the clinic should you have any questions or concerns. The clinic phone number is [phone].  Please show the [facility] at check-in to the Emergency Department and triage nurse.

## 2020-11-16 NOTE — Patient Instructions (Signed)
Implanted Port Insertion, Care After This sheet gives you information about how to care for yourself after your procedure. Your health care provider may also give you more specific instructions. If you have problems or questions, contact your health care provider. What can I expect after the procedure? After the procedure, it is common to have:  Discomfort at the port insertion site.  Bruising on the skin over the port. This should improve over 3-4 days. Follow these instructions at home: Port care  After your port is placed, you will get a manufacturer's information card. The card has information about your port. Keep this card with you at all times.  Take care of the port as told by your health care provider. Ask your health care provider if you or a family member can get training for taking care of the port at home. A home health care nurse may also take care of the port.  Make sure to remember what type of port you have. Incision care  Follow instructions from your health care provider about how to take care of your port insertion site. Make sure you: ? Wash your hands with soap and water before and after you change your bandage (dressing). If soap and water are not available, use hand sanitizer. ? Change your dressing as told by your health care provider. ? Leave stitches (sutures), skin glue, or adhesive strips in place. These skin closures may need to stay in place for 2 weeks or longer. If adhesive strip edges start to loosen and curl up, you may trim the loose edges. Do not remove adhesive strips completely unless your health care provider tells you to do that.  Check your port insertion site every day for signs of infection. Check for: ? Redness, swelling, or pain. ? Fluid or blood. ? Warmth. ? Pus or a bad smell.      Activity  Return to your normal activities as told by your health care provider. Ask your health care provider what activities are safe for you.  Do not  lift anything that is heavier than 10 lb (4.5 kg), or the limit that you are told, until your health care provider says that it is safe. General instructions  Take over-the-counter and prescription medicines only as told by your health care provider.  Do not take baths, swim, or use a hot tub until your health care provider approves. Ask your health care provider if you may take showers. You may only be allowed to take sponge baths.  Do not drive for 24 hours if you were given a sedative during your procedure.  Wear a medical alert bracelet in case of an emergency. This will tell any health care providers that you have a port.  Keep all follow-up visits as told by your health care provider. This is important. Contact a health care provider if:  You cannot flush your port with saline as directed, or you cannot draw blood from the port.  You have a fever or chills.  You have redness, swelling, or pain around your port insertion site.  You have fluid or blood coming from your port insertion site.  Your port insertion site feels warm to the touch.  You have pus or a bad smell coming from the port insertion site. Get help right away if:  You have chest pain or shortness of breath.  You have bleeding from your port that you cannot control. Summary  Take care of the port as told by your   health care provider. Keep the manufacturer's information card with you at all times.  Change your dressing as told by your health care provider.  Contact a health care provider if you have a fever or chills or if you have redness, swelling, or pain around your port insertion site.  Keep all follow-up visits as told by your health care provider. This information is not intended to replace advice given to you by your health care provider. Make sure you discuss any questions you have with your health care provider. Document Revised: 04/29/2018 Document Reviewed: 04/29/2018 Elsevier Patient Education   2021 [employer]  

## 2020-11-16 NOTE — Progress Notes (Signed)
MD states okay to treat with ANC 1.1

## 2020-11-29 NOTE — Progress Notes (Signed)
HEMATOLOGY/ONCOLOGY CLINIC NOTE  Date of Service: 11/29/20     PCP: Marzetta Board MD  CHIEF COMPLAINTS/PURPOSE OF CONSULTATION:  Continued f/u for mx of myeloma   HISTORY OF PRESENTING ILLNESS:   Madison Parker is a wonderful 70 y.o. 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  Patient has a history of smoldering multiple myeloma which she reports she was diagnosed with in 2011 when she was evaluated by a hematologist in [place]. She reports that she presented with low blood counts (low WBC)   and after significant workup she had a bone marrow examination based on which she was told that she has smoldering multiple myeloma. Patient notes that she had a bone survey at that time which was negative. She was monitored there closely and then moved to [facility] in 2015 to stay with her son whose wife was being treated for breast cancer. Patient was following with Dr. Delight Hoh in [place]. She reports not having had any treatment for multiple myeloma.  Her last labs from about a year ago showed a SPEP with no OBSERVED M spike . Serum free light chains showed an elevation of kappa Free light chain 310 and lambda free light chain of 12.7 with an abnormal Kappa/ Lambda ratio of 24.38 ( up from 19 previously). Random urine showed an M protein complement of 44%.  She lives in [place] and requested transfer of care to [place]. She was offered follow-up but [facility] in [place]  But prefers to  follow [place] in [place]. Patient reports her energy levels been stable. She has chronic back pain which has improved since the patient had her spinal surgery in April 2017 (L4-L5 interbody fusion). Patient notes she has had some chronic left hip pain which is somewhat more bothersome with the navy on the lateral aspect of her upper thighs that's painful. She also notes some right hip pain.  Over the last few weeks he notes some upper neck pain especially when bending her neck backwards and tingling numbness in her right upper extremity.  Has been working on losing some weight voluntarily.  No acute other new symptoms.  AUTOLOGOUS STEM CELL TRANSPLANT:  Disease status at time of transplant: sCR MRD positive  ASBMT risk stratification: low risk, ECOG/KPS: 0 / 90%, HSCT-CI score: 0  Started mobilization on 09/03/19 using GCS-F and Plerixafor. She collected stem cells on 09/07/19. The pre-processing total was 7.18x10(6) CD34+ cells/kg. The post-processing total was 1.11x10(7) CD34+ cells/kg frozen in 3 bags.  Preparative regimen with Melphalan 200 mg/m2 on 09/14/19 (outpatient).  Infusion of stem cells - 7.4 x 10(6) on 09/15/19 (outpatient)  Admitted Day +8 for neutropenic fever, uncontrolled n/v, diarrhea  Treated with short course of prednisone for peri-engraftment syndrome  TREATMENT:  Started induction therapy with carfilozomib, lenlaidomide, and dexamethasone 28 day cycle on 04/27/2019. She had a great response to initial cycle. She developed a DVT on 06/24/19 and was started on apixaban as an anticoagulant. She had normalization of her light chains by the start of C4 on 07/28/19.  She was referred to the myeloma clinic at [facility] for BMT consult and seen on 08/10/19. Serology from her visit showed no M-spike by SPEP, a free light chain ratio of 1.07 (kappa 11.42, lambda 10.65), and an abnormal beta-2-micro of 3.02. She was started on pre-transplant evaluation while undergoing C5 of therapy.  Treatment discontinued in 07/2019 in  preparation for autologous stem cell transplant.   INTERVAL HISTORY:  Madison Parker returns today for f/u of her multiple myeloma. She is s/p HDT -Auto HSCT on 09/15/2019. She is here for maintenance Carfilzomib. The patient's last visit with [place] was on 10/05/2020. The pt reports that she is doing well overall.  The pt reports that she has been having  deep boils/carbuncles in the chest wall under her left breast area that started off as an appearance of a dot/mole. They have since increased in size recently, but itch when the pt sweats. She notes no problem tolerating the treatment at this time. The pt notes that she is scheduled for her repeat Bm Bx with [facility] on March 3rd and a 24 hr UPEP. They are not going to do a PET scan. The pt is also taking her vaccines on this day as well. The pt has received all COVID vaccines already.   Lab results today 11/30/2020 of CBC w/diff and CMP is as follows: all values are WNL except for WBC of 3.1K, RBC of 3.41, Hgb of 10.8, HCT of 33.0, Neutro Abs of 1.4K. CMP in progress.  On review of systems, pt reports carbuncle in chest wall under left breast and denies fevers, chills, infection issues, leg swelling, new bone pains, abdominal pain, dental issues, back pain, breast pain and any other symptoms.  MEDICAL HISTORY:  Past Medical History:  Diagnosis Date  . Headache   . Low back pain   . SBO (small bowel obstruction) ([place]) 2010  . Smoldering multiple myeloma ([facility])   Previous history of hypothyroidism- not currently medications Anxiety Obesity .There is no height or weight on file to calculate BMI. Vitamin D deficiency Smoldering multiple myeloma diagnosed in 2011 Lumbosacral radiculopathy Left hip pain  SURGICAL HISTORY: Past Surgical History:  Procedure Laterality Date  . ABDOMINAL HYSTERECTOMY     complete  . BACK SURGERY     lower at [facility]  . COLONOSCOPY WITH PROPOFOL N/A 08/23/2020   Procedure: COLONOSCOPY WITH PROPOFOL;  Surgeon: Harvel Quale, MD;  Location: [facility];  Service: Gastroenterology;  Laterality: N/A;  900  . colonscopy  2014  . GASTRIC BYPASS  yrs ago  . HERNIA REPAIR    . IR IMAGING GUIDED PORT INSERTION  04/20/2019  . KNEE SURGERY  06/04/2009   both knees replaced   . POLYPECTOMY  08/23/2020   Procedure: POLYPECTOMY;  Surgeon: Harvel Quale, MD;  Location: [facility];  Service: Gastroenterology;;  . TONSILLECTOMY  age 61  . TOTAL HIP ARTHROPLASTY Left 08/16/2017   Procedure: LEFT TOTAL HIP ARTHROPLASTY ANTERIOR APPROACH;  Surgeon: Dorna Leitz, MD;  Location: [facility];  Service: Orthopedics;  Laterality: Left;  Spinal surgery April 2017  SOCIAL HISTORY: Social History   Socioeconomic History  . Marital status: Married    Spouse name: Not on file  . Number of children: Not on file  . Years of education: Not on file  . Highest education level: Not on file  Occupational History  . Not on file  Tobacco Use  . Smoking status: Former Smoker    Packs/day: 0.50    Years: 10.00    Pack years: 5.00    Types: Cigarettes  . Smokeless tobacco: Never Used  . Tobacco comment: quit 1977  Vaping Use  . Vaping Use: Never used  Substance and Sexual Activity  . Alcohol use: Yes    Comment: occ  . Drug use: No  . Sexual activity: Not  on file  Other Topics Concern  . Not on file  Social History Narrative  . Not on file   Social Determinants of Health   Financial Resource Strain: Not on file  Food Insecurity: Not on file  Transportation Needs: Not on file  Physical Activity: Not on file  Stress: Not on file  Social Connections: Not on file  Intimate Partner Violence: Not on file  Occasional alcohol use Former smoker and smoked 1 pack per week for about 9 years has since quit.  FAMILY HISTORY: No family history on file.  ALLERGIES:  is allergic to codeine.  MEDICATIONS:  Current Outpatient Medications  Medication Sig Dispense Refill  . acyclovir (ZOVIRAX) 400 MG tablet Take 1 tablet (400 mg total) by mouth 2 (two) times daily. (Patient taking differently: Take 800 mg by mouth daily. ) 180 tablet 0  . FOLIC ACID PO Take 4,627 mg by mouth daily.     Marland Kitchen lidocaine-prilocaine (EMLA) cream Apply 1 application topically as needed. (Patient not taking: Reported on 08/17/2020) 30 g 2  . ondansetron (ZOFRAN) 8 MG tablet  Take 1 tablet (8 mg total) by mouth 2 (two) times daily as needed (Nausea or vomiting). (Patient not taking: Reported on 08/17/2020) 30 tablet 1  . Probiotic Product (PROBIOTIC DAILY PO) Take 2 capsules by mouth daily. Gummies    . prochlorperazine (COMPAZINE) 10 MG tablet Take 1 tablet (10 mg total) by mouth every 6 (six) hours as needed for nausea or vomiting. (Patient not taking: Reported on 08/17/2020) 30 tablet 0  . TURMERIC-GINGER PO Take 2 each by mouth daily.     . vitamin B-12 (CYANOCOBALAMIN) 1000 MCG tablet Take 1,000 mcg by mouth daily.     . Vitamin D, Ergocalciferol, (DRISDOL) 1.25 MG (50000 UNIT) CAPS capsule TAKE 1 CAPSULE ONCE A WEEK (Patient taking differently: Take 50,000 Units by mouth every 7 (seven) days. Sunday) 13 capsule 3   No current facility-administered medications for this visit.   Facility-Administered Medications Ordered in Other Visits  Medication Dose Route Frequency Provider Last Rate Last Admin  . sodium chloride flush (NS) 0.9 % injection 10 mL  10 mL Intracatheter PRN Brunetta Genera, MD   10 mL at 05/11/19 1437    REVIEW OF SYSTEMS:  10 Point review of Systems was done is negative except as noted above.  PHYSICAL EXAMINATION: ECOG FS:1 - Symptomatic but completely ambulatory  There were no vitals filed for this visit. Wt Readings from Last 3 Encounters:  11/16/20 194 lb 12 oz (88.3 kg)  11/02/20 199 lb 8 oz (90.5 kg)  10/19/20 199 lb (90.3 kg)   There is no height or weight on file to calculate BMI.    GENERAL:alert, in no acute distress and comfortable SKIN: no acute rashes, no significant lesions EYES: conjunctiva are pink and non-injected, sclera anicteric OROPHARYNX: MMM, no exudates, no oropharyngeal erythema or ulceration NECK: supple, no JVD LYMPH:  no palpable lymphadenopathy in the cervical, axillary or inguinal regions LUNGS: clear to auscultation b/l with normal respiratory effort HEART: regular rate & rhythm ABDOMEN:   normoactive bowel sounds , non tender, not distended. Extremity: no pedal edema PSYCH: alert & oriented x 3 with fluent speech NEURO: no focal motor/sensory deficits  LABORATORY DATA:  I have reviewed the data as listed  . CBC Latest Ref Rng & Units 11/16/2020 11/02/2020 10/19/2020  WBC 4.0 - 10.5 K/uL 3.3(L) 3.5(L) 2.8(L)  Hemoglobin 12.0 - 15.0 g/dL 11.3(L) 10.4(L) 10.4(L)  Hematocrit 36.0 -  46.0 % 34.0(L) 31.4(L) 31.4(L)  Platelets 150 - 400 K/uL 201 182 187  ANC 1300 . CBC    Component Value Date/Time   WBC 3.3 (L) 11/16/2020 0922   RBC 3.60 (L) 11/16/2020 0922   HGB 11.3 (L) 11/16/2020 0922   HGB 11.3 (L) 11/12/2019 0836   HGB 11.3 (L) 09/30/2017 1145   HCT 34.0 (L) 11/16/2020 0922   HCT 35.1 09/30/2017 1145   PLT 201 11/16/2020 0922   PLT 149 (L) 11/12/2019 0836   PLT 172 09/30/2017 1145   MCV 94.4 11/16/2020 0922   MCV 97.5 09/30/2017 1145   MCH 31.4 11/16/2020 0922   MCHC 33.2 11/16/2020 0922   RDW 14.5 11/16/2020 0922   RDW 14.0 09/30/2017 1145   LYMPHSABS 1.7 11/16/2020 0922   LYMPHSABS 1.3 09/30/2017 1145   MONOABS 0.4 11/16/2020 0922   MONOABS 0.2 09/30/2017 1145   EOSABS 0.0 11/16/2020 0922   EOSABS 0.0 09/30/2017 1145   EOSABS 0.1 06/03/2014 1003   BASOSABS 0.0 11/16/2020 0922   BASOSABS 0.0 09/30/2017 1145     . CMP Latest Ref Rng & Units 11/16/2020 11/02/2020 10/19/2020  Glucose 70 - 99 mg/dL 80 95 94  BUN 8 - 23 mg/dL _0 Creatinine 0.44 - 1.00 mg/dL 0.86 0.90 0.81  Sodium 135 - 145 mmol/L 140 141 142  Potassium 3.5 - 5.1 mmol/L 4.3 4.0 3.8  Chloride 98 - 111 mmol/L 109 113(H) 112(H)  CO2 22 - 32 mmol/L _1 Calcium 8.9 - 10.3 mg/dL 9.0 8.8(L) 8.6(L)  Total Protein 6.5 - 8.1 g/dL 6.6 6.2(L) 6.1(L)  Total Bilirubin 0.3 - 1.2 mg/dL 1.3(H) 0.9 1.0  Alkaline Phos 38 - 126 U/L 56 55 56  AST 15 - 41 U/L _2 ALT 0 - 44 U/L _3 06/08/2020 K/L light chains:         Component     Latest Ref Rng & Units 11/08/2016  LDH      125 - 245 U/L 220  Beta 2     0.6 - 2.4 mg/L 2.5 (H)   Component     Latest Ref Rng & Units 07/30/2017  Ferritin     9 - 269 ng/ml 81  Vitamin B12     232 - 1,245 pg/mL 594   Magnesium: Component Magnesium  Latest Ref Rng & Units 1.7 - 2.4 mg/dL  10/02/2019 1.7  10/21/2019 1.7  11/12/2019 1.7    03/11/19 BM Bx:   03/11/19 Cytogenetics:            RADIOGRAPHIC STUDIES: I have personally reviewed the radiological images as listed and agreed with the findings in the report. No results found.   Bone survey 11/08/2016 IMPRESSION: 1. Small lytic lesion in the right scapula. 2. Possible small lytic lesion in the left scapula. 3. Postoperative changes. 4. Significant degenerative changes in both hips, left greater than right. 5. No evidence for acute fracture   Electronically Signed   By: Nolon Nations M.D.   On: 11/08/2016 16:19   ASSESSMENT & PLAN:   70 y.o. very pleasant lady with history of   1) Multiple myeloma (concern for small lytic lesions in left and right scapulae) - (appears light chain producing) and Progressive anemia  This was apparently diagnosed as smoldering myeloma in 2011 by her hematologist in [place].  No renal failure/hypercalcemia.  Bone survey with concern for possible small lytic lesions in B/L scapulae.  PET/CT scan on 03/12/2017 showed no concerning bone lesions.  Initial Bm Bx with 17% kappa restricted plasma cells consistent with plasma cell neoplasm.  No treatment so far.  03/11/19 BM Bx revealed involvement by 50% plasma cells; genetics -high risk t(14;16), previous genetics from April 2018 BM Bx were standard risk  03/16/19 MRI Lumbar reveals several explanations for her lower back pain including L2-L3 stenosis, but reassuringly, there was not evidence of involvement by multiple myeloma  04/01/19 PET/CT which revealed no FDG evidence of active multiple myeloma within the skeleton. No evidence of lytic lesions within  the skeleton or soft tissue Plasmacytoma.  05/07/2019 DXA scan revealed osteopenia, T-score of -1.2.  2) Chronic leukopenia - ? Related to SMM vs other nutritional deficiencies (h/o gastric bypass surgery put her at risk for nutritional deficiencies).  WBC counts of have been close to normal recently today at 3.8k with an [place] of 1600.  B12 levels WNL  Ferritin adequate  ?additional factor -recent NSAID use.  ?element of benign ethnic neutropenia.  Has not had any issues with frequent infection.     4) Neuropathy/radiculopathy -Continue follow up with orthopedics  5) Superficial Venous Thrombosis of the left small saphenous vein  06/24/2019 US venous left : Right: There is no evidence of deep vein thrombosis in the lower extremity. No cystic structure found in the popliteal fossa. Left: Findings consistent with acute superficial vein thrombosis involving the left small saphenous vein. There is no evidence of deep vein thrombosis in the lower extremity. However, portions of this examination were limited- see technologist comments above. No cystic structure found in the popliteal fossa  PLAN: -Discussed pt labwork today, 11/30/2020; blood counts stable, mild anemia. WBC on lower side.  -Discussed pt last MMP and Light Chains from 10/19/2020; pt continues to be in remission. -Discussed Evusheld and pt's eligibility. Recommended pt look into this. The pt is willing to get a referral. -Continue post-transplant vaccination plan with [facility]. -Recommended pt continue 1 year post-transplant f/u w [facility]. The pt is scheduled for 12/15/2020.  -Advised pt the carbuncles could be due to an allergic or yeast infection under the breast that are now hyperpigmented. Not a concern at this time. -The pt has no prohibitive toxicities from continuing C13D15 Carfilzomib at this time. Will continue Carflizomib at 56 mg/m.  -Recommend pt monitor blood pressures at home, in the morning prior to  food, drink, or activity.  -Continue Zometa q12 weeks  -Continue maintenance Carfilzomib. -Will see back in 2 months with labs.    FOLLOW UP: Please schedule next 3 cycles (6 doses) Of Carfilzomib  Portflush and labs with each appointment MD visit in 8 weeks Plz schedule zometa q12weeks - 3 doses   The total time spent in the appointment was 30 minutes and more than 50% was on counseling and direct patient cares.  All of the patient's questions were answered with apparent satisfaction. The patient knows to call the clinic with any problems, questions or concerns.    Sullivan Lone MD [facility] Hematology/Oncology Physician [facility]  (Office):       [phone] (Work cell):  [phone] (Fax):           [phone]  I, Reinaldo Raddle, am acting as scribe for Dr. Sullivan Lone, MD.     .I have reviewed the above documentation for accuracy and completeness, and I agree with the above. Brunetta Genera MD

## 2020-11-30 ENCOUNTER — Inpatient Hospital Stay (HOSPITAL_BASED_OUTPATIENT_CLINIC_OR_DEPARTMENT_OTHER): Payer: Medicare Other

## 2020-11-30 ENCOUNTER — Inpatient Hospital Stay: Payer: Medicare Other

## 2020-11-30 ENCOUNTER — Inpatient Hospital Stay (HOSPITAL_BASED_OUTPATIENT_CLINIC_OR_DEPARTMENT_OTHER): Payer: Medicare Other | Admitting: Hematology

## 2020-11-30 ENCOUNTER — Other Ambulatory Visit: Payer: Self-pay

## 2020-11-30 VITALS — BP 119/72 | HR 71 | Temp 96.2°F | Resp 18 | Ht 63.0 in | Wt 196.0 lb

## 2020-11-30 DIAGNOSIS — Z5112 Encounter for antineoplastic immunotherapy: Secondary | ICD-10-CM | POA: Diagnosis not present

## 2020-11-30 DIAGNOSIS — Z23 Encounter for immunization: Secondary | ICD-10-CM | POA: Diagnosis not present

## 2020-11-30 DIAGNOSIS — Z7189 Other specified counseling: Secondary | ICD-10-CM

## 2020-11-30 DIAGNOSIS — Z95828 Presence of other vascular implants and grafts: Secondary | ICD-10-CM

## 2020-11-30 DIAGNOSIS — C9001 Multiple myeloma in remission: Secondary | ICD-10-CM | POA: Diagnosis not present

## 2020-11-30 DIAGNOSIS — C9000 Multiple myeloma not having achieved remission: Secondary | ICD-10-CM

## 2020-11-30 DIAGNOSIS — Z5111 Encounter for antineoplastic chemotherapy: Secondary | ICD-10-CM

## 2020-11-30 DIAGNOSIS — D702 Other drug-induced agranulocytosis: Secondary | ICD-10-CM

## 2020-11-30 LAB — CBC WITH DIFFERENTIAL/PLATELET
Abs Immature Granulocytes: 0.01 10*3/uL (ref 0.00–0.07)
Basophils Absolute: 0 10*3/uL (ref 0.0–0.1)
Basophils Relative: 1 %
Eosinophils Absolute: 0 10*3/uL (ref 0.0–0.5)
Eosinophils Relative: 1 %
HCT: 33 % — ABNORMAL LOW (ref 36.0–46.0)
Hemoglobin: 10.8 g/dL — ABNORMAL LOW (ref 12.0–15.0)
Immature Granulocytes: 0 %
Lymphocytes Relative: 42 %
Lymphs Abs: 1.3 10*3/uL (ref 0.7–4.0)
MCH: 31.7 pg (ref 26.0–34.0)
MCHC: 32.7 g/dL (ref 30.0–36.0)
MCV: 96.8 fL (ref 80.0–100.0)
Monocytes Absolute: 0.4 10*3/uL (ref 0.1–1.0)
Monocytes Relative: 12 %
Neutro Abs: 1.4 10*3/uL — ABNORMAL LOW (ref 1.7–7.7)
Neutrophils Relative %: 44 %
Platelets: 174 10*3/uL (ref 150–400)
RBC: 3.41 MIL/uL — ABNORMAL LOW (ref 3.87–5.11)
RDW: 14.6 % (ref 11.5–15.5)
WBC: 3.1 10*3/uL — ABNORMAL LOW (ref 4.0–10.5)
nRBC: 0 % (ref 0.0–0.2)

## 2020-11-30 LAB — CMP (CANCER CENTER ONLY)
ALT: 19 U/L (ref 0–44)
AST: 19 U/L (ref 15–41)
Albumin: 3.7 g/dL (ref 3.5–5.0)
Alkaline Phosphatase: 52 U/L (ref 38–126)
Anion gap: 8 (ref 5–15)
BUN: 11 mg/dL (ref 8–23)
CO2: 21 mmol/L — ABNORMAL LOW (ref 22–32)
Calcium: 8.4 mg/dL — ABNORMAL LOW (ref 8.9–10.3)
Chloride: 112 mmol/L — ABNORMAL HIGH (ref 98–111)
Creatinine: 0.78 mg/dL (ref 0.44–1.00)
GFR, Estimated: >60 mL/min (ref >60)
Glucose, Bld: 94 mg/dL (ref 70–99)
Potassium: 3.9 mmol/L (ref 3.5–5.1)
Sodium: 141 mmol/L (ref 135–145)
Total Bilirubin: 1.1 mg/dL (ref 0.3–1.2)
Total Protein: 6.2 g/dL — ABNORMAL LOW (ref 6.5–8.1)

## 2020-11-30 MED ORDER — SODIUM CHLORIDE 0.9% FLUSH
10.0000 mL | INTRAVENOUS | Status: DC | PRN
  Administered 2020-11-30: 10 mL
  Filled 2020-11-30: qty 10

## 2020-11-30 MED ORDER — SODIUM CHLORIDE 0.9% FLUSH
10.0000 mL | Freq: Once | INTRAVENOUS | Status: AC | PRN
  Administered 2020-11-30: 10 mL
  Filled 2020-11-30: qty 10

## 2020-11-30 MED ORDER — SODIUM CHLORIDE 0.9 % IV SOLN
Freq: Once | INTRAVENOUS | Status: AC
  Filled 2020-11-30: qty 250

## 2020-11-30 MED ORDER — DEXTROSE 5 % IV SOLN
56.0000 mg/m2 | Freq: Once | INTRAVENOUS | Status: AC
  Administered 2020-11-30: 120 mg via INTRAVENOUS
  Filled 2020-11-30: qty 60

## 2020-11-30 MED ORDER — PROCHLORPERAZINE MALEATE 10 MG PO TABS
10.0000 mg | ORAL_TABLET | Freq: Once | ORAL | Status: AC
Start: 2020-11-30 — End: 2020-11-30
  Administered 2020-11-30: 10 mg via ORAL

## 2020-11-30 MED ORDER — HEPARIN SOD (PORK) LOCK FLUSH 100 UNIT/ML IV SOLN
500.0000 [IU] | Freq: Once | INTRAVENOUS | Status: AC | PRN
  Administered 2020-11-30: 500 [IU]
  Filled 2020-11-30: qty 5

## 2020-11-30 MED ORDER — SODIUM CHLORIDE 0.9 % IV SOLN
20.0000 mg | Freq: Once | INTRAVENOUS | Status: AC
  Administered 2020-11-30: 20 mg via INTRAVENOUS
  Filled 2020-11-30: qty 20

## 2020-11-30 MED ORDER — PROCHLORPERAZINE MALEATE 10 MG PO TABS
ORAL_TABLET | ORAL | Status: AC
  Filled 2020-11-30: qty 1

## 2020-11-30 MED ORDER — ACETAMINOPHEN 500 MG PO TABS
ORAL_TABLET | ORAL | Status: AC
  Filled 2020-11-30: qty 2

## 2020-11-30 MED ORDER — ACETAMINOPHEN 500 MG PO TABS
1000.0000 mg | ORAL_TABLET | Freq: Once | ORAL | Status: AC
  Administered 2020-11-30: 1000 mg via ORAL

## 2020-11-30 NOTE — Patient Instructions (Signed)

## 2020-11-30 NOTE — Progress Notes (Signed)
Verbal order per Dr. Irene Limbo - Madaline Brilliant to receive Kyprolis today with ANC 1.4

## 2020-11-30 NOTE — Patient Instructions (Signed)
[facility] Discharge Instructions for Patients Receiving Chemotherapy  Today you received the following chemotherapy agents Carfilzomib (KYPROLIS).  To help prevent nausea and vomiting after your treatment, we encourage you to take your nausea medication as prescribed.  If you develop nausea and vomiting that is not controlled by your nausea medication, call the clinic.   BELOW ARE SYMPTOMS THAT SHOULD BE REPORTED IMMEDIATELY:  *FEVER GREATER THAN 100.5 F  *CHILLS WITH OR WITHOUT FEVER  NAUSEA AND VOMITING THAT IS NOT CONTROLLED WITH YOUR NAUSEA MEDICATION  *UNUSUAL SHORTNESS OF BREATH  *UNUSUAL BRUISING OR BLEEDING  TENDERNESS IN MOUTH AND THROAT WITH OR WITHOUT PRESENCE OF ULCERS  *URINARY PROBLEMS  *BOWEL PROBLEMS  UNUSUAL RASH Items with * indicate a potential emergency and should be followed up as soon as possible.  Feel free to call the clinic should you have any questions or concerns. The clinic phone number is [phone].  Please show the CHEMO ALERT CARD at check-in to the Emergency Department and triage nurse.   

## 2020-12-07 ENCOUNTER — Other Ambulatory Visit: Payer: Self-pay | Admitting: Adult Health

## 2020-12-07 ENCOUNTER — Encounter: Payer: Self-pay | Admitting: Adult Health

## 2020-12-07 DIAGNOSIS — C9000 Multiple myeloma not having achieved remission: Secondary | ICD-10-CM

## 2020-12-07 NOTE — Progress Notes (Signed)
I connected by phone with Madison Parker on 12/07/2020, 9:22 AM to discuss the potential use of a new treatment, tixagevimab/cilgavimab, for pre-exposure prophylaxis for prevention of coronavirus disease 2019 (COVID-19) caused by the SARS-CoV-2 virus.  This patient is a 70 y.o. female that meets the FDA criteria for Emergency Use Authorization of tixagevimab/cilgavimab for pre-exposure prophylaxis of COVID-19 disease. Pt meets following criteria:  Age >12 yr and weight > 40kg  Not currently infected with SARS-CoV-2 and has no known recent exposure to an individual infected with SARS-CoV-2 AND o Who has moderate to severe immune compromise due to a medical condition or receipt of immunosuppressive medications or treatments and may not mount an adequate immune response to COVID-19 vaccination or  o Vaccination with any available COVID-19 vaccine, according to the approved or authorized schedule, is not recommended due to a history of severe adverse reaction (e.g., severe allergic reaction) to a COVID-19 vaccine(s) and/or COVID-19 vaccine component(s).  o Patient meets the following definition of mod-severe immune compromised status: 4. Lung transplants, other solid organ transplant recipients on continual belatacept therapy, or multiple myeloma (actively receiving treatment)  I have spoken and communicated the following to the patient or parent/caregiver regarding COVID monoclonal antibody treatment:  1. FDA has authorized the emergency use of tixagevimab/cilgavimab for the pre-exposure prophylaxis of COVID-19 in patients with moderate-severe immunocompromised status, who meet above EUA criteria.  2. The significant known and potential risks and benefits of COVID monoclonal antibody, and the extent to which such potential risks and benefits are unknown.  3. Information on available alternative treatments and the risks and benefits of those alternatives, including clinical trials.  4. The patient or  parent/caregiver has the option to accept or refuse COVID monoclonal antibody treatment.  After reviewing this information with the patient, agree to receive tixagevimab/cilgavimab  Scot Dock, NP, 12/07/2020, 9:22 AM

## 2020-12-08 ENCOUNTER — Inpatient Hospital Stay: Payer: Medicare Other

## 2020-12-08 ENCOUNTER — Other Ambulatory Visit: Payer: Self-pay | Admitting: *Deleted

## 2020-12-08 ENCOUNTER — Other Ambulatory Visit: Payer: Self-pay | Admitting: Hematology

## 2020-12-08 DIAGNOSIS — C9000 Multiple myeloma not having achieved remission: Secondary | ICD-10-CM

## 2020-12-13 ENCOUNTER — Other Ambulatory Visit (HOSPITAL_COMMUNITY): Payer: Self-pay | Admitting: Adult Health

## 2020-12-13 NOTE — Progress Notes (Signed)
I reviewed with [place] that we received updated FDA guidance that all patients should receive the updated dose of 300mg Tixagevimab/300mg  Cilgavimab.  We will adjust the timing of administration of her Evusheld from 12/28/2020 to 01/11/2021 and update her orders once order sets have been established.  Patient verbalized understanding and agreed to updated dose. Orders for the 150mg  order have been canceled.    Wilber Bihari, NP

## 2020-12-14 ENCOUNTER — Inpatient Hospital Stay: Payer: Medicare Other

## 2020-12-14 ENCOUNTER — Inpatient Hospital Stay: Payer: Medicare Other | Attending: Hematology

## 2020-12-14 ENCOUNTER — Other Ambulatory Visit: Payer: Self-pay

## 2020-12-14 VITALS — BP 123/76 | HR 58 | Temp 98.2°F | Resp 18

## 2020-12-14 DIAGNOSIS — D702 Other drug-induced agranulocytosis: Secondary | ICD-10-CM

## 2020-12-14 DIAGNOSIS — C9000 Multiple myeloma not having achieved remission: Secondary | ICD-10-CM | POA: Diagnosis not present

## 2020-12-14 DIAGNOSIS — Z7189 Other specified counseling: Secondary | ICD-10-CM

## 2020-12-14 DIAGNOSIS — Z95828 Presence of other vascular implants and grafts: Secondary | ICD-10-CM

## 2020-12-14 DIAGNOSIS — Z298 Encounter for other specified prophylactic measures: Secondary | ICD-10-CM | POA: Diagnosis not present

## 2020-12-14 DIAGNOSIS — Z5112 Encounter for antineoplastic immunotherapy: Secondary | ICD-10-CM | POA: Insufficient documentation

## 2020-12-14 DIAGNOSIS — Z9221 Personal history of antineoplastic chemotherapy: Secondary | ICD-10-CM | POA: Diagnosis not present

## 2020-12-14 DIAGNOSIS — Z23 Encounter for immunization: Secondary | ICD-10-CM

## 2020-12-14 DIAGNOSIS — C9001 Multiple myeloma in remission: Secondary | ICD-10-CM

## 2020-12-14 DIAGNOSIS — Z5111 Encounter for antineoplastic chemotherapy: Secondary | ICD-10-CM

## 2020-12-14 LAB — CBC WITH DIFFERENTIAL/PLATELET
Abs Immature Granulocytes: 0 10*3/uL (ref 0.00–0.07)
Basophils Absolute: 0 10*3/uL (ref 0.0–0.1)
Basophils Relative: 1 %
Eosinophils Absolute: 0 10*3/uL (ref 0.0–0.5)
Eosinophils Relative: 1 %
HCT: 32.5 % — ABNORMAL LOW (ref 36.0–46.0)
Hemoglobin: 10.7 g/dL — ABNORMAL LOW (ref 12.0–15.0)
Immature Granulocytes: 0 %
Lymphocytes Relative: 49 %
Lymphs Abs: 1.4 10*3/uL (ref 0.7–4.0)
MCH: 31.7 pg (ref 26.0–34.0)
MCHC: 32.9 g/dL (ref 30.0–36.0)
MCV: 96.2 fL (ref 80.0–100.0)
Monocytes Absolute: 0.3 10*3/uL (ref 0.1–1.0)
Monocytes Relative: 12 %
Neutro Abs: 1.1 10*3/uL — ABNORMAL LOW (ref 1.7–7.7)
Neutrophils Relative %: 37 %
Platelets: 185 10*3/uL (ref 150–400)
RBC: 3.38 MIL/uL — ABNORMAL LOW (ref 3.87–5.11)
RDW: 13.8 % (ref 11.5–15.5)
WBC: 2.8 10*3/uL — ABNORMAL LOW (ref 4.0–10.5)
nRBC: 0 % (ref 0.0–0.2)

## 2020-12-14 LAB — CMP (CANCER CENTER ONLY)
ALT: 20 U/L (ref 0–44)
AST: 17 U/L (ref 15–41)
Albumin: 3.8 g/dL (ref 3.5–5.0)
Alkaline Phosphatase: 53 U/L (ref 38–126)
Anion gap: 8 (ref 5–15)
BUN: 7 mg/dL — ABNORMAL LOW (ref 8–23)
CO2: 21 mmol/L — ABNORMAL LOW (ref 22–32)
Calcium: 8.3 mg/dL — ABNORMAL LOW (ref 8.9–10.3)
Chloride: 112 mmol/L — ABNORMAL HIGH (ref 98–111)
Creatinine: 0.74 mg/dL (ref 0.44–1.00)
GFR, Estimated: >60 mL/min (ref >60)
Glucose, Bld: 74 mg/dL (ref 70–99)
Potassium: 3.9 mmol/L (ref 3.5–5.1)
Sodium: 141 mmol/L (ref 135–145)
Total Bilirubin: 1 mg/dL (ref 0.3–1.2)
Total Protein: 6 g/dL — ABNORMAL LOW (ref 6.5–8.1)

## 2020-12-14 MED ORDER — ACETAMINOPHEN 500 MG PO TABS
1000.0000 mg | ORAL_TABLET | Freq: Once | ORAL | Status: AC
  Administered 2020-12-14: 1000 mg via ORAL

## 2020-12-14 MED ORDER — SODIUM CHLORIDE 0.9 % IV SOLN
Freq: Once | INTRAVENOUS | Status: DC
  Filled 2020-12-14: qty 250

## 2020-12-14 MED ORDER — SODIUM CHLORIDE 0.9% FLUSH
10.0000 mL | INTRAVENOUS | Status: DC | PRN
  Administered 2020-12-14: 10 mL via INTRAVENOUS
  Filled 2020-12-14: qty 10

## 2020-12-14 MED ORDER — SODIUM CHLORIDE 0.9% FLUSH
10.0000 mL | INTRAVENOUS | Status: DC | PRN
  Administered 2020-12-14: 10 mL
  Filled 2020-12-14: qty 10

## 2020-12-14 MED ORDER — SODIUM CHLORIDE 0.9 % IV SOLN
Freq: Once | INTRAVENOUS | Status: AC
  Filled 2020-12-14: qty 250

## 2020-12-14 MED ORDER — SODIUM CHLORIDE 0.9 % IV SOLN
20.0000 mg | Freq: Once | INTRAVENOUS | Status: AC
  Administered 2020-12-14: 20 mg via INTRAVENOUS
  Filled 2020-12-14: qty 20

## 2020-12-14 MED ORDER — ACETAMINOPHEN 500 MG PO TABS
ORAL_TABLET | ORAL | Status: AC
  Filled 2020-12-14: qty 2

## 2020-12-14 MED ORDER — PROCHLORPERAZINE MALEATE 10 MG PO TABS
ORAL_TABLET | ORAL | Status: AC
  Filled 2020-12-14: qty 1

## 2020-12-14 MED ORDER — CARFILZOMIB CHEMO INJECTION 60 MG
56.0000 mg/m2 | Freq: Once | INTRAVENOUS | Status: AC
  Administered 2020-12-14: 120 mg via INTRAVENOUS
  Filled 2020-12-14: qty 60

## 2020-12-14 MED ORDER — PROCHLORPERAZINE MALEATE 10 MG PO TABS
10.0000 mg | ORAL_TABLET | Freq: Once | ORAL | Status: AC
  Administered 2020-12-14: 10 mg via ORAL

## 2020-12-14 MED ORDER — HEPARIN SOD (PORK) LOCK FLUSH 100 UNIT/ML IV SOLN
500.0000 [IU] | Freq: Once | INTRAVENOUS | Status: AC | PRN
Start: 2020-12-14 — End: 2020-12-14
  Administered 2020-12-14: 500 [IU]
  Filled 2020-12-14: qty 5

## 2020-12-14 NOTE — Progress Notes (Signed)
Ok to treat with ANC today per MD Irene Limbo

## 2020-12-14 NOTE — Patient Instructions (Signed)

## 2020-12-14 NOTE — Patient Instructions (Signed)
[facility] Discharge Instructions for Patients Receiving Chemotherapy  Today you received the following chemotherapy agents:  Kyprolis  To help prevent nausea and vomiting after your treatment, we encourage you to take your nausea medication as directed.   If you develop nausea and vomiting that is not controlled by your nausea medication, call the clinic.   BELOW ARE SYMPTOMS THAT SHOULD BE REPORTED IMMEDIATELY:  *FEVER GREATER THAN 100.5 F  *CHILLS WITH OR WITHOUT FEVER  NAUSEA AND VOMITING THAT IS NOT CONTROLLED WITH YOUR NAUSEA MEDICATION  *UNUSUAL SHORTNESS OF BREATH  *UNUSUAL BRUISING OR BLEEDING  TENDERNESS IN MOUTH AND THROAT WITH OR WITHOUT PRESENCE OF ULCERS  *URINARY PROBLEMS  *BOWEL PROBLEMS  UNUSUAL RASH Items with * indicate a potential emergency and should be followed up as soon as possible.  Feel free to call the clinic should you have any questions or concerns. The clinic phone number is [phone].  Please show the CHEMO ALERT CARD at check-in to the Emergency Department and triage nurse.   

## 2020-12-15 DIAGNOSIS — Z79899 Other long term (current) drug therapy: Secondary | ICD-10-CM | POA: Diagnosis not present

## 2020-12-15 DIAGNOSIS — Z9484 Stem cells transplant status: Secondary | ICD-10-CM | POA: Diagnosis not present

## 2020-12-15 DIAGNOSIS — Z90710 Acquired absence of both cervix and uterus: Secondary | ICD-10-CM | POA: Diagnosis not present

## 2020-12-15 DIAGNOSIS — Z9889 Other specified postprocedural states: Secondary | ICD-10-CM | POA: Diagnosis not present

## 2020-12-15 DIAGNOSIS — R252 Cramp and spasm: Secondary | ICD-10-CM | POA: Diagnosis not present

## 2020-12-15 DIAGNOSIS — Z7983 Long term (current) use of bisphosphonates: Secondary | ICD-10-CM | POA: Diagnosis not present

## 2020-12-15 DIAGNOSIS — Z9079 Acquired absence of other genital organ(s): Secondary | ICD-10-CM | POA: Diagnosis not present

## 2020-12-15 DIAGNOSIS — R200 Anesthesia of skin: Secondary | ICD-10-CM | POA: Diagnosis not present

## 2020-12-15 DIAGNOSIS — Z87891 Personal history of nicotine dependence: Secondary | ICD-10-CM | POA: Diagnosis not present

## 2020-12-15 DIAGNOSIS — B009 Herpesviral infection, unspecified: Secondary | ICD-10-CM | POA: Diagnosis not present

## 2020-12-15 DIAGNOSIS — R519 Headache, unspecified: Secondary | ICD-10-CM | POA: Diagnosis not present

## 2020-12-15 DIAGNOSIS — R030 Elevated blood-pressure reading, without diagnosis of hypertension: Secondary | ICD-10-CM | POA: Diagnosis not present

## 2020-12-15 DIAGNOSIS — Z96653 Presence of artificial knee joint, bilateral: Secondary | ICD-10-CM | POA: Diagnosis not present

## 2020-12-15 DIAGNOSIS — Z23 Encounter for immunization: Secondary | ICD-10-CM | POA: Diagnosis not present

## 2020-12-15 DIAGNOSIS — C9001 Multiple myeloma in remission: Secondary | ICD-10-CM | POA: Diagnosis not present

## 2020-12-15 DIAGNOSIS — M549 Dorsalgia, unspecified: Secondary | ICD-10-CM | POA: Diagnosis not present

## 2020-12-15 LAB — KAPPA/LAMBDA LIGHT CHAINS
Kappa free light chain: 9.6 mg/L (ref 3.3–19.4)
Kappa, lambda light chain ratio: 1.63 (ref 0.26–1.65)
Lambda free light chains: 5.9 mg/L (ref 5.7–26.3)

## 2020-12-17 LAB — MULTIPLE MYELOMA PANEL, SERUM
Albumin SerPl Elph-Mcnc: 3.7 g/dL (ref 2.9–4.4)
Albumin/Glob SerPl: 1.8 — ABNORMAL HIGH (ref 0.7–1.7)
Alpha 1: 0.2 g/dL (ref 0.0–0.4)
Alpha2 Glob SerPl Elph-Mcnc: 0.6 g/dL (ref 0.4–1.0)
B-Globulin SerPl Elph-Mcnc: 0.9 g/dL (ref 0.7–1.3)
Gamma Glob SerPl Elph-Mcnc: 0.5 g/dL (ref 0.4–1.8)
Globulin, Total: 2.1 g/dL — ABNORMAL LOW (ref 2.2–3.9)
IgA: 16 mg/dL — ABNORMAL LOW (ref 87–352)
IgG (Immunoglobin G), Serum: 503 mg/dL — ABNORMAL LOW (ref 586–1602)
IgM (Immunoglobulin M), Srm: 18 mg/dL — ABNORMAL LOW (ref 26–217)
Total Protein ELP: 5.8 g/dL — ABNORMAL LOW (ref 6.0–8.5)

## 2020-12-19 DIAGNOSIS — Z5112 Encounter for antineoplastic immunotherapy: Secondary | ICD-10-CM | POA: Diagnosis not present

## 2020-12-21 LAB — UPEP/UIFE/LIGHT CHAINS/TP, 24-HR UR
% BETA, Urine: 37.5 %
ALPHA 1 URINE: 4.5 %
Albumin, U: 29.4 %
Alpha 2, Urine: 12.6 %
Free Kappa Lt Chains,Ur: 52.85 mg/L (ref 0.63–113.79)
Free Kappa/Lambda Ratio: 7.27 (ref 1.03–31.76)
Free Lambda Lt Chains,Ur: 7.27 mg/L (ref 0.47–11.77)
GAMMA GLOBULIN URINE: 16 %
Total Protein, Urine-Ur/day: 60 mg/24 hr (ref 30–150)
Total Protein, Urine: 17.1 mg/dL
Total Volume: 350

## 2020-12-23 ENCOUNTER — Other Ambulatory Visit: Payer: Self-pay

## 2020-12-23 ENCOUNTER — Encounter: Payer: Self-pay | Admitting: Emergency Medicine

## 2020-12-23 ENCOUNTER — Ambulatory Visit (INDEPENDENT_AMBULATORY_CARE_PROVIDER_SITE_OTHER): Payer: Medicare Other

## 2020-12-23 ENCOUNTER — Ambulatory Visit
Admission: EM | Admit: 2020-12-23 | Discharge: 2020-12-23 | Disposition: A | Discharge status: Home / Self Care | Payer: Medicare Other | Attending: Emergency Medicine | Admitting: Emergency Medicine

## 2020-12-23 DIAGNOSIS — W19XXXA Unspecified fall, initial encounter: Secondary | ICD-10-CM

## 2020-12-23 DIAGNOSIS — M25531 Pain in right wrist: Secondary | ICD-10-CM

## 2020-12-23 NOTE — ED Provider Notes (Signed)
[facility]   [id number] 12/23/20 Arrival Time: 0928   Chief Complaint  Patient presents with  . Wrist Pain     SUBJECTIVE: History from: patient.  Madison Parker is a 70 y.o. female who presented to the urgent care with a complaint of right wrist pain.  Developed the pain after falling.  She localizes the pain to the right wrist.  She describes the pain as constant and achy.  She has tried OTC medications without relief.  Her symptoms are made worse with ROM.  She denies similar symptoms in the past.  Denies chills, fever, nausea, vomiting, diarrhea   ROS: As per HPI.  All other pertinent ROS negative.     Past Medical History:  Diagnosis Date  . Headache   . Low back pain   . SBO (small bowel obstruction) (HCC) 2010  . Smoldering multiple myeloma (HCC)    Past Surgical History:  Procedure Laterality Date  . ABDOMINAL HYSTERECTOMY     complete  . BACK SURGERY     lower at [facility]  . COLONOSCOPY WITH PROPOFOL N/A 08/23/2020   Procedure: COLONOSCOPY WITH PROPOFOL;  Surgeon: Castaneda Mayorga, Daniel, MD;  Location: [facility];  Service: Gastroenterology;  Laterality: N/A;  900  . colonscopy  2014  . GASTRIC BYPASS  yrs ago  . HERNIA REPAIR    . IR IMAGING GUIDED PORT INSERTION  04/20/2019  . KNEE SURGERY  06/04/2009   both knees replaced   . POLYPECTOMY  08/23/2020   Procedure: POLYPECTOMY;  Surgeon: Castaneda Mayorga, Daniel, MD;  Location: [facility];  Service: Gastroenterology;;  . TONSILLECTOMY  age 30  . TOTAL HIP ARTHROPLASTY Left 08/16/2017   Procedure: LEFT TOTAL HIP ARTHROPLASTY ANTERIOR APPROACH;  Surgeon: Graves, John, MD;  Location: [facility];  Service: Orthopedics;  Laterality: Left;   Allergies  Allergen Reactions  . Codeine Anxiety   Current Facility-Administered Medications on File Prior to Encounter  Medication Dose Route Frequency Provider Last Rate Last Admin  . sodium chloride flush (NS) 0.9 % injection 10 mL  10 mL Intracatheter PRN  Kale, Gautam Kishore, MD   10 mL at 05/11/19 1437   Current Outpatient Medications on File Prior to Encounter  Medication Sig Dispense Refill  . acyclovir (ZOVIRAX) 400 MG tablet Take 1 tablet (400 mg total) by mouth 2 (two) times daily. (Patient taking differently: Take 800 mg by mouth daily. ) 180 tablet 0  . FOLIC ACID PO Take 1,360 mg by mouth daily.     . HYDROcodone-Acetaminophen 5-300 MG TABS Vicodin 5 mg-300 mg tablet  Take 1 tablet every 4 hours by oral route.    . lidocaine-prilocaine (EMLA) cream Apply 1 application topically as needed. (Patient not taking: Reported on 08/17/2020) 30 g 2  . ondansetron (ZOFRAN) 8 MG tablet Take 1 tablet (8 mg total) by mouth 2 (two) times daily as needed (Nausea or vomiting). (Patient not taking: Reported on 08/17/2020) 30 tablet 1  . Probiotic Product (PROBIOTIC DAILY PO) Take 2 capsules by mouth daily. Gummies    . prochlorperazine (COMPAZINE) 10 MG tablet Take 1 tablet (10 mg total) by mouth every 6 (six) hours as needed for nausea or vomiting. (Patient not taking: Reported on 08/17/2020) 30 tablet 0  . TURMERIC-GINGER PO Take 2 each by mouth daily.     . vitamin B-12 (CYANOCOBALAMIN) 1000 MCG tablet Take 1,000 mcg by mouth daily.     . Vitamin D, Ergocalciferol, (DRISDOL) 1.25 MG (50000 UNIT) CAPS capsule   TAKE 1 CAPSULE ONCE A WEEK (Patient taking differently: Take 50,000 Units by mouth every 7 (seven) days. Sunday) 13 capsule 3   Social History   Socioeconomic History  . Marital status: Married    Spouse name: Not on file  . Number of children: Not on file  . Years of education: Not on file  . Highest education level: Not on file  Occupational History  . Not on file  Tobacco Use  . Smoking status: Former Smoker    Packs/day: 0.50    Years: 10.00    Pack years: 5.00    Types: Cigarettes  . Smokeless tobacco: Never Used  . Tobacco comment: quit 1977  Vaping Use  . Vaping Use: Never used  Substance and Sexual Activity  . Alcohol use:  Yes    Comment: occ  . Drug use: No  . Sexual activity: Not on file  Other Topics Concern  . Not on file  Social History Narrative  . Not on file   Social Determinants of Health   Financial Resource Strain: Not on file  Food Insecurity: Not on file  Transportation Needs: Not on file  Physical Activity: Not on file  Stress: Not on file  Social Connections: Not on file  Intimate Partner Violence: Not on file   History reviewed. No pertinent family history.  OBJECTIVE:  Vitals:   12/23/20 0957  BP: 129/78  Pulse: 68  Resp: 18  Temp: 98 F (36.7 C)  TempSrc: Oral  SpO2: 98%     Physical Exam Vitals and nursing note reviewed.  Constitutional:      General: She is not in acute distress.    Appearance: Normal appearance. She is normal weight. She is not ill-appearing, toxic-appearing or diaphoretic.  HENT:     Head: Normocephalic.  Cardiovascular:     Rate and Rhythm: Normal rate and regular rhythm.     Pulses: Normal pulses.     Heart sounds: Normal heart sounds. No murmur heard. No friction rub. No gallop.   Pulmonary:     Effort: Pulmonary effort is normal. No respiratory distress.     Breath sounds: Normal breath sounds. No stridor. No wheezing, rhonchi or rales.  Chest:     Chest wall: No tenderness.  Musculoskeletal:     Right wrist: Tenderness present.     Left wrist: Normal.     Comments: The right wrist is without any obvious asymmetry or deformity when compared to the left wrist.  There is no ecchymosis, open wound, lesion, warmth, or swelling present.  Tenderness to palpation.  Limited range of motion with pain.  Neurovascular status intact  Neurological:     Mental Status: She is alert and oriented to person, place, and time.     LABS:  No results found for this or any previous visit (from the past 24 hour(s)).   RADIOLOGY  DG Wrist Complete Right  Result Date: 12/23/2020 CLINICAL DATA:  70-year-old female status post fall yesterday with  continued pain. Currently undergoing chemotherapy. Multiple myeloma. EXAM: RIGHT WRIST - COMPLETE 3+ VIEW COMPARISON:  None. FINDINGS: Heterogeneous bone mineralization likely related to history of multiple myeloma. Distal radius and ulna appear to remain intact. Carpal bone alignment and joint spaces preserved. No metacarpal fracture identified. No discrete soft tissue injury. IMPRESSION: No acute fracture or dislocation identified about the right wrist. Electronically Signed   By: H  Hall M.D.   On: 12/23/2020 10:34    Right wrist X-ray is negative for   bony abnormality including fracture or dislocation.  I have reviewed the x-ray myself and the radiologist interpretation.  I am in agreement with the radiologist interpretation.   ASSESSMENT & PLAN:  1. Right wrist pain   2. Fall, initial encounter     No orders of the defined types were placed in this encounter.   Discharge instructions  Ace wrap was applied Continue to take OTC Tylenol/ibuprofen as needed for pain Follow RICE instruction that is attached Follow-up with PCP Return or go to ED if you develop any new or worsening of your symptoms  Reviewed expectations re: course of current medical issues. Questions answered. Outlined signs and symptoms indicating need for more acute intervention. Patient verbalized understanding. After Visit Summary given.         Emerson Monte, FNP 12/23/20 1045

## 2020-12-23 NOTE — ED Triage Notes (Signed)
Fell yesterday and used hand to brace for fall.  Right wrist pain.

## 2020-12-23 NOTE — Discharge Instructions (Addendum)
Ace wrap was applied Continue to take OTC Tylenol/ibuprofen as needed for pain Follow RICE instruction that is attached Follow-up with PCP Return or go to ED if you develop any new or worsening of your symptoms

## 2020-12-28 ENCOUNTER — Inpatient Hospital Stay: Payer: Medicare Other

## 2020-12-28 ENCOUNTER — Ambulatory Visit: Payer: Medicare Other

## 2020-12-28 ENCOUNTER — Other Ambulatory Visit: Payer: Self-pay

## 2020-12-28 VITALS — BP 134/76 | HR 71 | Temp 98.0°F | Resp 18 | Wt 193.5 lb

## 2020-12-28 DIAGNOSIS — Z7189 Other specified counseling: Secondary | ICD-10-CM

## 2020-12-28 DIAGNOSIS — C9000 Multiple myeloma not having achieved remission: Secondary | ICD-10-CM

## 2020-12-28 DIAGNOSIS — Z5112 Encounter for antineoplastic immunotherapy: Secondary | ICD-10-CM | POA: Diagnosis not present

## 2020-12-28 DIAGNOSIS — D702 Other drug-induced agranulocytosis: Secondary | ICD-10-CM

## 2020-12-28 DIAGNOSIS — Z5111 Encounter for antineoplastic chemotherapy: Secondary | ICD-10-CM

## 2020-12-28 DIAGNOSIS — Z95828 Presence of other vascular implants and grafts: Secondary | ICD-10-CM

## 2020-12-28 LAB — CBC WITH DIFFERENTIAL/PLATELET
Abs Immature Granulocytes: 0.01 10*3/uL (ref 0.00–0.07)
Basophils Absolute: 0 10*3/uL (ref 0.0–0.1)
Basophils Relative: 0 %
Eosinophils Absolute: 0.1 10*3/uL (ref 0.0–0.5)
Eosinophils Relative: 2 %
HCT: 30.2 % — ABNORMAL LOW (ref 36.0–46.0)
Hemoglobin: 9.9 g/dL — ABNORMAL LOW (ref 12.0–15.0)
Immature Granulocytes: 0 %
Lymphocytes Relative: 44 %
Lymphs Abs: 1.4 10*3/uL (ref 0.7–4.0)
MCH: 31.2 pg (ref 26.0–34.0)
MCHC: 32.8 g/dL (ref 30.0–36.0)
MCV: 95.3 fL (ref 80.0–100.0)
Monocytes Absolute: 0.4 10*3/uL (ref 0.1–1.0)
Monocytes Relative: 12 %
Neutro Abs: 1.3 10*3/uL — ABNORMAL LOW (ref 1.7–7.7)
Neutrophils Relative %: 42 %
Platelets: 186 10*3/uL (ref 150–400)
RBC: 3.17 MIL/uL — ABNORMAL LOW (ref 3.87–5.11)
RDW: 14.3 % (ref 11.5–15.5)
WBC: 3.1 10*3/uL — ABNORMAL LOW (ref 4.0–10.5)
nRBC: 0 % (ref 0.0–0.2)

## 2020-12-28 LAB — CMP (CANCER CENTER ONLY)
ALT: 17 U/L (ref 0–44)
AST: 16 U/L (ref 15–41)
Albumin: 3.7 g/dL (ref 3.5–5.0)
Alkaline Phosphatase: 61 U/L (ref 38–126)
Anion gap: 6 (ref 5–15)
BUN: 16 mg/dL (ref 8–23)
CO2: 24 mmol/L (ref 22–32)
Calcium: 9 mg/dL (ref 8.9–10.3)
Chloride: 110 mmol/L (ref 98–111)
Creatinine: 0.81 mg/dL (ref 0.44–1.00)
GFR, Estimated: >60 mL/min (ref >60)
Glucose, Bld: 85 mg/dL (ref 70–99)
Potassium: 3.9 mmol/L (ref 3.5–5.1)
Sodium: 140 mmol/L (ref 135–145)
Total Bilirubin: 1 mg/dL (ref 0.3–1.2)
Total Protein: 5.9 g/dL — ABNORMAL LOW (ref 6.5–8.1)

## 2020-12-28 MED ORDER — HEPARIN SOD (PORK) LOCK FLUSH 100 UNIT/ML IV SOLN
500.0000 [IU] | Freq: Once | INTRAVENOUS | Status: AC | PRN
  Administered 2020-12-28: 500 [IU]
  Filled 2020-12-28: qty 5

## 2020-12-28 MED ORDER — SODIUM CHLORIDE 0.9 % IV SOLN
Freq: Once | INTRAVENOUS | Status: AC
Start: 2020-12-28 — End: 2020-12-28
  Filled 2020-12-28: qty 250

## 2020-12-28 MED ORDER — ACETAMINOPHEN 500 MG PO TABS
ORAL_TABLET | ORAL | Status: AC
  Filled 2020-12-28: qty 2

## 2020-12-28 MED ORDER — SODIUM CHLORIDE 0.9% FLUSH
10.0000 mL | Freq: Once | INTRAVENOUS | Status: AC
  Administered 2020-12-28: 10 mL
  Filled 2020-12-28: qty 10

## 2020-12-28 MED ORDER — ACETAMINOPHEN 500 MG PO TABS
1000.0000 mg | ORAL_TABLET | Freq: Once | ORAL | Status: AC
  Administered 2020-12-28: 1000 mg via ORAL

## 2020-12-28 MED ORDER — DEXTROSE 5 % IV SOLN
56.0000 mg/m2 | Freq: Once | INTRAVENOUS | Status: AC
  Administered 2020-12-28: 120 mg via INTRAVENOUS
  Filled 2020-12-28: qty 60

## 2020-12-28 MED ORDER — SODIUM CHLORIDE 0.9 % IV SOLN
20.0000 mg | Freq: Once | INTRAVENOUS | Status: AC
  Administered 2020-12-28: 20 mg via INTRAVENOUS
  Filled 2020-12-28: qty 20

## 2020-12-28 MED ORDER — PROCHLORPERAZINE MALEATE 10 MG PO TABS
ORAL_TABLET | ORAL | Status: AC
  Filled 2020-12-28: qty 1

## 2020-12-28 MED ORDER — SODIUM CHLORIDE 0.9% FLUSH
10.0000 mL | INTRAVENOUS | Status: DC | PRN
Start: 2020-12-28 — End: 2020-12-28
  Administered 2020-12-28: 10 mL
  Filled 2020-12-28: qty 10

## 2020-12-28 MED ORDER — PROCHLORPERAZINE MALEATE 10 MG PO TABS
10.0000 mg | ORAL_TABLET | Freq: Once | ORAL | Status: AC
  Administered 2020-12-28: 10 mg via ORAL

## 2020-12-28 NOTE — Patient Instructions (Signed)
[facility] Discharge Instructions for Patients Receiving Chemotherapy  Today you received the following chemotherapy agents carflizomib  To help prevent nausea and vomiting after your treatment, we encourage you to take your nausea medication as directed.   If you develop nausea and vomiting that is not controlled by your nausea medication, call the clinic.   BELOW ARE SYMPTOMS THAT SHOULD BE REPORTED IMMEDIATELY:  *FEVER GREATER THAN 100.5 F  *CHILLS WITH OR WITHOUT FEVER  NAUSEA AND VOMITING THAT IS NOT CONTROLLED WITH YOUR NAUSEA MEDICATION  *UNUSUAL SHORTNESS OF BREATH  *UNUSUAL BRUISING OR BLEEDING  TENDERNESS IN MOUTH AND THROAT WITH OR WITHOUT PRESENCE OF ULCERS  *URINARY PROBLEMS  *BOWEL PROBLEMS  UNUSUAL RASH Items with * indicate a potential emergency and should be followed up as soon as possible.  Feel free to call the clinic should you have any questions or concerns. The clinic phone number is [phone].  Please show the [place] at check-in to the Emergency Department and triage nurse.

## 2021-01-01 ENCOUNTER — Other Ambulatory Visit: Payer: Self-pay | Admitting: Physician Assistant

## 2021-01-01 DIAGNOSIS — D472 Monoclonal gammopathy: Secondary | ICD-10-CM

## 2021-01-01 DIAGNOSIS — C9000 Multiple myeloma not having achieved remission: Secondary | ICD-10-CM

## 2021-01-01 NOTE — Progress Notes (Signed)
I connected by phone with Madison Parker on 01/01/2021, 9:52 AM to discuss the potential use of a new treatment, tixagevimab/cilgavimab, for pre-exposure prophylaxis for prevention of coronavirus disease 2019 (COVID-19) caused by the SARS-CoV-2 virus.  This patient is a 70 y.o. female that meets the FDA criteria for Emergency Use Authorization of tixagevimab/cilgavimab for pre-exposure prophylaxis of COVID-19 disease. Pt meets following criteria:  Age >12 yr and weight > 40kg  Not currently infected with SARS-CoV-2 and has no known recent exposure to an individual infected with SARS-CoV-2 AND o Who has moderate to severe immune compromise due to a medical condition or receipt of immunosuppressive medications or treatments and may not mount an adequate immune response to COVID-19 vaccination or  o Vaccination with any available COVID-19 vaccine, according to the approved or authorized schedule, is not recommended due to a history of severe adverse reaction (e.g., severe allergic reaction) to a COVID-19 vaccine(s) and/or COVID-19 vaccine component(s).  o Patient meets the following definition of mod-severe immune compromised status: 6. Other actively treated hematologic malignancies or severe congenital immunodeficiency syndromes  I have spoken and communicated the following to the patient or parent/caregiver regarding COVID monoclonal antibody treatment:  1. FDA has authorized the emergency use of tixagevimab/cilgavimab for the pre-exposure prophylaxis of COVID-19 in patients with moderate-severe immunocompromised status, who meet above EUA criteria.  2. The significant known and potential risks and benefits of COVID monoclonal antibody, and the extent to which such potential risks and benefits are unknown.  3. Information on available alternative treatments and the risks and benefits of those alternatives, including clinical trials.  4. The patient or parent/caregiver has the option to accept or  refuse COVID monoclonal antibody treatment.  After reviewing this information with the patient, agree to receive tixagevimab/cilgavimab  [name], PA-C, 01/01/2021, 9:52 AM

## 2021-01-11 ENCOUNTER — Inpatient Hospital Stay: Payer: Medicare Other

## 2021-01-11 ENCOUNTER — Other Ambulatory Visit: Payer: Self-pay | Admitting: Hematology

## 2021-01-11 ENCOUNTER — Other Ambulatory Visit: Payer: Self-pay

## 2021-01-11 VITALS — BP 149/84 | HR 86 | Temp 99.2°F | Resp 18

## 2021-01-11 DIAGNOSIS — C9000 Multiple myeloma not having achieved remission: Secondary | ICD-10-CM

## 2021-01-11 DIAGNOSIS — Z9221 Personal history of antineoplastic chemotherapy: Secondary | ICD-10-CM | POA: Diagnosis not present

## 2021-01-11 DIAGNOSIS — Z95828 Presence of other vascular implants and grafts: Secondary | ICD-10-CM

## 2021-01-11 DIAGNOSIS — Z7189 Other specified counseling: Secondary | ICD-10-CM

## 2021-01-11 DIAGNOSIS — D649 Anemia, unspecified: Secondary | ICD-10-CM

## 2021-01-11 DIAGNOSIS — Z5111 Encounter for antineoplastic chemotherapy: Secondary | ICD-10-CM

## 2021-01-11 DIAGNOSIS — Z5112 Encounter for antineoplastic immunotherapy: Secondary | ICD-10-CM | POA: Diagnosis not present

## 2021-01-11 DIAGNOSIS — D472 Monoclonal gammopathy: Secondary | ICD-10-CM

## 2021-01-11 DIAGNOSIS — Z298 Encounter for other specified prophylactic measures: Secondary | ICD-10-CM | POA: Diagnosis not present

## 2021-01-11 LAB — CBC WITH DIFFERENTIAL/PLATELET
Abs Immature Granulocytes: 0.01 10*3/uL (ref 0.00–0.07)
Basophils Absolute: 0 10*3/uL (ref 0.0–0.1)
Basophils Relative: 1 %
Eosinophils Absolute: 0.1 10*3/uL (ref 0.0–0.5)
Eosinophils Relative: 2 %
HCT: 31.3 % — ABNORMAL LOW (ref 36.0–46.0)
Hemoglobin: 10.1 g/dL — ABNORMAL LOW (ref 12.0–15.0)
Immature Granulocytes: 0 %
Lymphocytes Relative: 42 %
Lymphs Abs: 1.5 10*3/uL (ref 0.7–4.0)
MCH: 31.9 pg (ref 26.0–34.0)
MCHC: 32.3 g/dL (ref 30.0–36.0)
MCV: 98.7 fL (ref 80.0–100.0)
Monocytes Absolute: 0.4 10*3/uL (ref 0.1–1.0)
Monocytes Relative: 11 %
Neutro Abs: 1.6 10*3/uL — ABNORMAL LOW (ref 1.7–7.7)
Neutrophils Relative %: 44 %
Platelets: 192 10*3/uL (ref 150–400)
RBC: 3.17 MIL/uL — ABNORMAL LOW (ref 3.87–5.11)
RDW: 14.4 % (ref 11.5–15.5)
WBC: 3.5 10*3/uL — ABNORMAL LOW (ref 4.0–10.5)
nRBC: 0 % (ref 0.0–0.2)

## 2021-01-11 LAB — CMP (CANCER CENTER ONLY)
ALT: 19 U/L (ref 0–44)
AST: 16 U/L (ref 15–41)
Albumin: 3.8 g/dL (ref 3.5–5.0)
Alkaline Phosphatase: 61 U/L (ref 38–126)
Anion gap: 10 (ref 5–15)
BUN: 12 mg/dL (ref 8–23)
CO2: 22 mmol/L (ref 22–32)
Calcium: 8.2 mg/dL — ABNORMAL LOW (ref 8.9–10.3)
Chloride: 109 mmol/L (ref 98–111)
Creatinine: 0.72 mg/dL (ref 0.44–1.00)
GFR, Estimated: >60 mL/min (ref >60)
Glucose, Bld: 82 mg/dL (ref 70–99)
Potassium: 3.5 mmol/L (ref 3.5–5.1)
Sodium: 141 mmol/L (ref 135–145)
Total Bilirubin: 1.2 mg/dL (ref 0.3–1.2)
Total Protein: 6.2 g/dL — ABNORMAL LOW (ref 6.5–8.1)

## 2021-01-11 MED ORDER — SODIUM CHLORIDE 0.9% FLUSH
10.0000 mL | INTRAVENOUS | Status: DC | PRN
  Administered 2021-01-11: 10 mL
  Filled 2021-01-11: qty 10

## 2021-01-11 MED ORDER — SODIUM CHLORIDE 0.9% FLUSH
10.0000 mL | Freq: Once | INTRAVENOUS | Status: AC
  Administered 2021-01-11: 10 mL
  Filled 2021-01-11: qty 10

## 2021-01-11 MED ORDER — HEPARIN SOD (PORK) LOCK FLUSH 100 UNIT/ML IV SOLN
500.0000 [IU] | Freq: Once | INTRAVENOUS | Status: AC | PRN
  Administered 2021-01-11: 500 [IU]
  Filled 2021-01-11: qty 5

## 2021-01-11 MED ORDER — PROCHLORPERAZINE MALEATE 10 MG PO TABS
10.0000 mg | ORAL_TABLET | Freq: Once | ORAL | Status: AC
  Administered 2021-01-11: 10 mg via ORAL

## 2021-01-11 MED ORDER — SODIUM CHLORIDE 0.9 % IV SOLN
INTRAVENOUS | Status: DC
  Filled 2021-01-11: qty 250

## 2021-01-11 MED ORDER — ACETAMINOPHEN 500 MG PO TABS
1000.0000 mg | ORAL_TABLET | Freq: Once | ORAL | Status: AC
  Administered 2021-01-11: 1000 mg via ORAL

## 2021-01-11 MED ORDER — TIXAGEVIMAB (PART OF EVUSHELD) INJECTION
300.0000 mg | Freq: Once | INTRAMUSCULAR | Status: AC
  Administered 2021-01-11: 300 mg via INTRAMUSCULAR
  Filled 2021-01-11: qty 3

## 2021-01-11 MED ORDER — SODIUM CHLORIDE 0.9 % IV SOLN
Freq: Once | INTRAVENOUS | Status: AC
  Filled 2021-01-11: qty 250

## 2021-01-11 MED ORDER — DEXTROSE 5 % IV SOLN
56.0000 mg/m2 | Freq: Once | INTRAVENOUS | Status: AC
  Administered 2021-01-11: 120 mg via INTRAVENOUS
  Filled 2021-01-11: qty 60

## 2021-01-11 MED ORDER — SODIUM CHLORIDE 0.9 % IV SOLN
20.0000 mg | Freq: Once | INTRAVENOUS | Status: AC
  Administered 2021-01-11: 20 mg via INTRAVENOUS
  Filled 2021-01-11: qty 20

## 2021-01-11 MED ORDER — ACETAMINOPHEN 500 MG PO TABS
ORAL_TABLET | ORAL | Status: AC
  Filled 2021-01-11: qty 2

## 2021-01-11 MED ORDER — CILGAVIMAB (PART OF EVUSHELD) INJECTION
300.0000 mg | Freq: Once | INTRAMUSCULAR | Status: AC
  Administered 2021-01-11: 300 mg via INTRAMUSCULAR
  Filled 2021-01-11: qty 3

## 2021-01-11 MED ORDER — PROCHLORPERAZINE MALEATE 10 MG PO TABS
ORAL_TABLET | ORAL | Status: AC
  Filled 2021-01-11: qty 1

## 2021-01-11 MED ORDER — SODIUM CHLORIDE 0.9 % IV SOLN
Freq: Once | INTRAVENOUS | Status: DC
  Filled 2021-01-11: qty 250

## 2021-01-11 NOTE — Progress Notes (Signed)
Patient remained 1 hour post observation for evusheld with no incident.  Discharged in stable condition.

## 2021-01-11 NOTE — Patient Instructions (Signed)
[facility] Discharge Instructions for Patients Receiving Chemotherapy  Today you received the following chemotherapy agents carflizomib  To help prevent nausea and vomiting after your treatment, we encourage you to take your nausea medication as directed.   If you develop nausea and vomiting that is not controlled by your nausea medication, call the clinic.   BELOW ARE SYMPTOMS THAT SHOULD BE REPORTED IMMEDIATELY:  *FEVER GREATER THAN 100.5 F  *CHILLS WITH OR WITHOUT FEVER  NAUSEA AND VOMITING THAT IS NOT CONTROLLED WITH YOUR NAUSEA MEDICATION  *UNUSUAL SHORTNESS OF BREATH  *UNUSUAL BRUISING OR BLEEDING  TENDERNESS IN MOUTH AND THROAT WITH OR WITHOUT PRESENCE OF ULCERS  *URINARY PROBLEMS  *BOWEL PROBLEMS  UNUSUAL RASH Items with * indicate a potential emergency and should be followed up as soon as possible.  Feel free to call the clinic should you have any questions or concerns. The clinic phone number is [phone].  Please show the [place] at check-in to the Emergency Department and triage nurse.

## 2021-01-11 NOTE — Patient Instructions (Signed)

## 2021-01-11 NOTE — Addendum Note (Signed)
Addended by: Priscille Loveless on: 01/11/2021 03:39 PM   Modules accepted: Orders

## 2021-01-13 ENCOUNTER — Telehealth: Payer: Self-pay

## 2021-01-13 NOTE — Telephone Encounter (Signed)
Attempted to return call to pt. Pt had a question about a medication on her record. Left pt a message to call back.

## 2021-01-24 DIAGNOSIS — C9001 Multiple myeloma in remission: Secondary | ICD-10-CM | POA: Diagnosis not present

## 2021-01-24 DIAGNOSIS — B372 Candidiasis of skin and nail: Secondary | ICD-10-CM | POA: Diagnosis not present

## 2021-01-24 MED FILL — Dexamethasone Sodium Phosphate Inj 100 MG/10ML: INTRAMUSCULAR | Qty: 2 | Status: AC

## 2021-01-24 NOTE — Progress Notes (Signed)
HEMATOLOGY/ONCOLOGY CLINIC NOTE  Date of Service: 01/25/21     PCP: Marzetta Board MD  CHIEF COMPLAINTS/PURPOSE OF CONSULTATION:  Continued f/u for mx of myeloma   HISTORY OF PRESENTING ILLNESS:   Madison Parker is a wonderful 70 y.o. 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  Patient has a history of smoldering multiple myeloma which she reports she was diagnosed with in 2011 when she was evaluated by a hematologist in [place]. She reports that she presented with low blood counts (low WBC)   and after significant workup she had a bone marrow examination based on which she was told that she has smoldering multiple myeloma. Patient notes that she had a bone survey at that time which was negative. She was monitored there closely and then moved to [facility] in 2015 to stay with her son whose wife was being treated for breast cancer. Patient was following with Dr. Delight Hoh in [place]. She reports not having had any treatment for multiple myeloma.  Her last labs from about a year ago showed a SPEP with no OBSERVED M spike . Serum free light chains showed an elevation of kappa Free light chain 310 and lambda free light chain of 12.7 with an abnormal Kappa/ Lambda ratio of 24.38 ( up from 19 previously). Random urine showed an M protein complement of 44%.  She lives in [place] and requested transfer of care to [place]. She was offered follow-up but [place] in [place]  But prefers to  follow [place] in [place]. Patient reports her energy levels been stable. She has chronic back pain which has improved since the patient had her spinal surgery in April 2017 (L4-L5 interbody fusion). Patient notes she has had some chronic left hip pain which is somewhat more bothersome with the navy on the lateral aspect of her upper thighs that's painful. She also notes some right hip pain.  Over the last few weeks he notes some upper neck pain especially when bending her neck backwards and tingling numbness in her right upper extremity.  Has been working on losing some weight voluntarily.  No acute other new symptoms.  AUTOLOGOUS STEM CELL TRANSPLANT:  Disease status at time of transplant: sCR MRD positive  ASBMT risk stratification: low risk, ECOG/KPS: 0 / 90%, HSCT-CI score: 0  Started mobilization on 09/03/19 using GCS-F and Plerixafor. She collected stem cells on 09/07/19. The pre-processing total was 7.18x10(6) CD34+ cells/kg. The post-processing total was 1.11x10(7) CD34+ cells/kg frozen in 3 bags.  Preparative regimen with Melphalan 200 mg/m2 on 09/14/19 (outpatient).  Infusion of stem cells - 7.4 x 10(6) on 09/15/19 (outpatient)  Admitted Day +8 for neutropenic fever, uncontrolled n/v, diarrhea  Treated with short course of prednisone for peri-engraftment syndrome  TREATMENT:  Started induction therapy with carfilozomib, lenlaidomide, and dexamethasone 28 day cycle on 04/27/2019. She had a great response to initial cycle. She developed a DVT on 06/24/19 and was started on apixaban as an anticoagulant. She had normalization of her light chains by the start of C4 on 07/28/19.  She was referred to the myeloma clinic at [facility] for BMT consult and seen on 08/10/19. Serology from her visit showed no M-spike by SPEP, a free light chain ratio of 1.07 (kappa 11.42, lambda 10.65), and an abnormal beta-2-micro of 3.02. She was started on pre-transplant evaluation while undergoing C5 of therapy.  Treatment discontinued in 07/2019 in  preparation for autologous stem cell transplant.   INTERVAL HISTORY:   Madison Parker returns today for f/u of her multiple myeloma. She is s/p HDT -Auto HSCT on 09/15/2019. She is here for maintenance Carfilzomib. The patient's last visit with [place] was on 11/30/2020. The pt reports that she is doing well overall.  The pt reports that she has a very  irritable itchy rash that is on her chets and back. The pt went to her PCP who gave her a topical cream and powder. The pt notes her PCP did not give her anything oral as he was unsure if it would interfere with her treatment. The pt notes she got the Evusheld two weeks ago. The pt notes the rash started around 1 week after the Evusheld and suddenly started spreading. The pt notes that she had a visit with [place] on 03/03 as well and she is now out 16 months from transplant. The pt notes no other concerns. The pt notes that she had stopped taking the Acyclovir and that [facility] put her on Valacyclovir.  Lab results today 01/25/2021 of CBC w/diff and CMP is as follows: all values are WNL except for WBC of 3.8K, RBC of 3.31, Hgb of 10.6, HCT of 32.7, CO2 of 21, Calcium of 8.3, Total Protein of 6.2, Total Bilirubin of 1.6.  On review of systems, pt reports rash on chest, stomach and back, yeast infection and denies any other symptoms.  MEDICAL HISTORY:  Past Medical History:  Diagnosis Date  . Headache   . Low back pain   . SBO (small bowel obstruction) ([place]) 2010  . Smoldering multiple myeloma (H[facility])   Previous history of hypothyroidism- not currently medications Anxiety Obesity .Body mass index is 33.64 kg/m. Vitamin D deficiency Smoldering multiple myeloma diagnosed in 2011 Lumbosacral radiculopathy Left hip pain  SURGICAL HISTORY: Past Surgical History:  Procedure Laterality Date  . ABDOMINAL HYSTERECTOMY     complete  . BACK SURGERY     lower at [facility]  . COLONOSCOPY WITH PROPOFOL N/A 08/23/2020   Procedure: COLONOSCOPY WITH PROPOFOL;  Surgeon: Harvel Quale, MD;  Location: [facility];  Service: Gastroenterology;  Laterality: N/A;  900  . colonscopy  2014  . GASTRIC BYPASS  yrs ago  . HERNIA REPAIR    . IR IMAGING GUIDED PORT INSERTION  04/20/2019  . KNEE SURGERY  06/04/2009   both knees replaced   . POLYPECTOMY  08/23/2020   Procedure: POLYPECTOMY;  Surgeon: Harvel Quale, MD;  Location: [facility];  Service: Gastroenterology;;  . TONSILLECTOMY  age 71  . TOTAL HIP ARTHROPLASTY Left 08/16/2017   Procedure: LEFT TOTAL HIP ARTHROPLASTY ANTERIOR APPROACH;  Surgeon: Dorna Leitz, MD;  Location: [facility] ORS;  Service: Orthopedics;  Laterality: Left;  Spinal surgery April 2017  SOCIAL HISTORY: Social History   Socioeconomic History  . Marital status: Married    Spouse name: Not on file  . Number of children: Not on file  . Years of education: Not on file  . Highest education level: Not on file  Occupational History  . Not on file  Tobacco Use  . Smoking status: Former Smoker    Packs/day: 0.50    Years: 10.00    Pack years: 5.00    Types: Cigarettes  . Smokeless tobacco: Never Used  . Tobacco comment: quit 1977  Vaping Use  . Vaping Use: Never used  Substance and Sexual Activity  . Alcohol use: Yes    Comment: occ  .  Drug use: No  . Sexual activity: Not on file  Other Topics Concern  . Not on file  Social History Narrative  . Not on file   Social Determinants of Health   Financial Resource Strain: Not on file  Food Insecurity: Not on file  Transportation Needs: Not on file  Physical Activity: Not on file  Stress: Not on file  Social Connections: Not on file  Intimate Partner Violence: Not on file  Occasional alcohol use Former smoker and smoked 1 pack per week for about 9 years has since quit.  FAMILY HISTORY: No family history on file.  ALLERGIES:  is allergic to codeine.  MEDICATIONS:  Current Outpatient Medications  Medication Sig Dispense Refill  . fluconazole (DIFLUCAN) 200 MG tablet Take 1 tablet (200 mg total) by mouth daily. 10 tablet 0  . FOLIC ACID PO Take 9,678 mg by mouth daily.     Marland Kitchen lidocaine-prilocaine (EMLA) cream Apply 1 application topically as needed. 30 g 2  . NYSTATIN powder Apply topically.    . nystatin-triamcinolone (MYCOLOG II) cream Apply topically.    . vitamin B-12 (CYANOCOBALAMIN) 1000  MCG tablet Take 1,000 mcg by mouth daily.     . Vitamin D, Ergocalciferol, (DRISDOL) 1.25 MG (50000 UNIT) CAPS capsule TAKE 1 CAPSULE ONCE A WEEK (Patient taking differently: Take 50,000 Units by mouth every 7 (seven) days. Sunday) 13 capsule 3  . acyclovir (ZOVIRAX) 400 MG tablet Take 1 tablet (400 mg total) by mouth 2 (two) times daily. (Patient taking differently: Take 800 mg by mouth daily. ) 180 tablet 0  . ondansetron (ZOFRAN) 8 MG tablet Take 1 tablet (8 mg total) by mouth 2 (two) times daily as needed (Nausea or vomiting). (Patient not taking: Reported on 08/17/2020) 30 tablet 1  . Probiotic Product (PROBIOTIC DAILY PO) Take 2 capsules by mouth daily. Gummies    . prochlorperazine (COMPAZINE) 10 MG tablet Take 1 tablet (10 mg total) by mouth every 6 (six) hours as needed for nausea or vomiting. (Patient not taking: Reported on 08/17/2020) 30 tablet 0  . TURMERIC-GINGER PO Take 2 each by mouth daily.      No current facility-administered medications for this visit.   Facility-Administered Medications Ordered in Other Visits  Medication Dose Route Frequency Provider Last Rate Last Admin  . sodium chloride flush (NS) 0.9 % injection 10 mL  10 mL Intracatheter PRN Brunetta Genera, MD   10 mL at 05/11/19 1437    REVIEW OF SYSTEMS:  10 Point review of Systems was done is negative except as noted above.   PHYSICAL EXAMINATION: ECOG FS:1 - Symptomatic but completely ambulatory  Vitals:   01/25/21 0934  BP: (!) 143/77  Pulse: 84  Resp: 16  Temp: 98.3 F (36.8 C)  SpO2: 99%   Wt Readings from Last 3 Encounters:  01/25/21 189 lb 14.4 oz (86.1 kg)  12/28/20 193 lb 8 oz (87.8 kg)  11/30/20 196 lb (88.9 kg)   Body mass index is 33.64 kg/m.    GENERAL:alert, in no acute distress and comfortable SKIN: no significant lesions. Current rash on stomach and back. EYES: conjunctiva are pink and non-injected, sclera anicteric OROPHARYNX: MMM, no exudates, no oropharyngeal erythema or  ulceration NECK: supple, no JVD LYMPH:  no palpable lymphadenopathy in the cervical, axillary or inguinal regions LUNGS: clear to auscultation b/l with normal respiratory effort HEART: regular rate & rhythm ABDOMEN:  normoactive bowel sounds , non tender, not distended. Extremity: no pedal edema PSYCH: alert &  oriented x 3 with fluent speech NEURO: no focal motor/sensory deficits  LABORATORY DATA:  I have reviewed the data as listed  . CBC Latest Ref Rng & Units 01/25/2021 01/11/2021 12/28/2020  WBC 4.0 - 10.5 K/uL 3.8(L) 3.5(L) 3.1(L)  Hemoglobin 12.0 - 15.0 g/dL 10.6(L) 10.1(L) 9.9(L)  Hematocrit 36.0 - 46.0 % 32.7(L) 31.3(L) 30.2(L)  Platelets 150 - 400 K/uL 182 192 186  ANC 1300 . CBC    Component Value Date/Time   WBC 3.8 (L) 01/25/2021 0904   RBC 3.31 (L) 01/25/2021 0904   HGB 10.6 (L) 01/25/2021 0904   HGB 11.3 (L) 11/12/2019 0836   HGB 11.3 (L) 09/30/2017 1145   HCT 32.7 (L) 01/25/2021 0904   HCT 35.1 09/30/2017 1145   PLT 182 01/25/2021 0904   PLT 149 (L) 11/12/2019 0836   PLT 172 09/30/2017 1145   MCV 98.8 01/25/2021 0904   MCV 97.5 09/30/2017 1145   MCH 32.0 01/25/2021 0904   MCHC 32.4 01/25/2021 0904   RDW 14.3 01/25/2021 0904   RDW 14.0 09/30/2017 1145   LYMPHSABS 1.4 01/25/2021 0904   LYMPHSABS 1.3 09/30/2017 1145   MONOABS 0.4 01/25/2021 0904   MONOABS 0.2 09/30/2017 1145   EOSABS 0.1 01/25/2021 0904   EOSABS 0.0 09/30/2017 1145   EOSABS 0.1 06/03/2014 1003   BASOSABS 0.0 01/25/2021 0904   BASOSABS 0.0 09/30/2017 1145     . CMP Latest Ref Rng & Units 01/25/2021 01/11/2021 12/28/2020  Glucose 70 - 99 mg/dL 91 82 85  BUN 8 - 23 mg/dL 9 12 16   Creatinine 0.44 - 1.00 mg/dL 0.75 0.72 0.81  Sodium 135 - 145 mmol/L 142 141 140  Potassium 3.5 - 5.1 mmol/L 3.8 3.5 3.9  Chloride 98 - 111 mmol/L 111 109 110  CO2 22 - 32 mmol/L 21(L) 22 24  Calcium 8.9 - 10.3 mg/dL 8.3(L) 8.2(L) 9.0  Total Protein 6.5 - 8.1 g/dL 6.2(L) 6.2(L) 5.9(L)  Total Bilirubin 0.3 -  1.2 mg/dL 1.6(H) 1.2 1.0  Alkaline Phos 38 - 126 U/L 66 61 61  AST 15 - 41 U/L 15 16 16   ALT 0 - 44 U/L 15 19 17     06/08/2020 K/L light chains:         Component     Latest Ref Rng & Units 11/08/2016  LDH     125 - 245 U/L 220  Beta 2     0.6 - 2.4 mg/L 2.5 (H)   Component     Latest Ref Rng & Units 07/30/2017  Ferritin     9 - 269 ng/ml 81  Vitamin B12     232 - 1,245 pg/mL 594   Magnesium: Component Magnesium  Latest Ref Rng & Units 1.7 - 2.4 mg/dL  10/02/2019 1.7  10/21/2019 1.7  11/12/2019 1.7    03/11/19 BM Bx:   03/11/19 Cytogenetics:            RADIOGRAPHIC STUDIES: I have personally reviewed the radiological images as listed and agreed with the findings in the report. No results found.   Bone survey 11/08/2016 IMPRESSION: 1. Small lytic lesion in the right scapula. 2. Possible small lytic lesion in the left scapula. 3. Postoperative changes. 4. Significant degenerative changes in both hips, left greater than right. 5. No evidence for acute fracture   Electronically Signed   By: Nolon Nations M.D.   On: 11/08/2016 16:19   ASSESSMENT & PLAN:   70 y.o. very pleasant lady with history of  1) Multiple myeloma (concern for small lytic lesions in left and right scapulae) - (appears light chain producing) and Progressive anemia  This was apparently diagnosed as smoldering myeloma in 2011 by her hematologist in [place].  No renal failure/hypercalcemia.  Bone survey with concern for possible small lytic lesions in B/L scapulae. PET/CT scan on 03/12/2017 showed no concerning bone lesions.  Initial Bm Bx with 17% kappa restricted plasma cells consistent with plasma cell neoplasm.  No treatment so far.  03/11/19 BM Bx revealed involvement by 50% plasma cells; genetics -high risk t(14;16), previous genetics from April 2018 BM Bx were standard risk  03/16/19 MRI Lumbar reveals several explanations for her lower back pain including  L2-L3 stenosis, but reassuringly, there was not evidence of involvement by multiple myeloma  04/01/19 PET/CT which revealed no FDG evidence of active multiple myeloma within the skeleton. No evidence of lytic lesions within the skeleton or soft tissue Plasmacytoma.  05/07/2019 DXA scan revealed osteopenia, T-score of -1.2.  2) Chronic leukopenia - ? Related to SMM vs other nutritional deficiencies (h/o gastric bypass surgery put her at risk for nutritional deficiencies).  WBC counts of have been close to normal recently today at 3.8k with an Cecil of 1600.  B12 levels WNL  Ferritin adequate  ?additional factor -recent NSAID use.  ?element of benign ethnic neutropenia.  Has not had any issues with frequent infection.     4) Neuropathy/radiculopathy -Continue follow up with orthopedics  5) Superficial Venous Thrombosis of the left small saphenous vein  06/24/2019 US venous left : Right: There is no evidence of deep vein thrombosis in the lower extremity. No cystic structure found in the popliteal fossa. Left: Findings consistent with acute superficial vein thrombosis involving the left small saphenous vein. There is no evidence of deep vein thrombosis in the lower extremity. However, portions of this examination were limited- see technologist comments above. No cystic structure found in the popliteal fossa  6) Candida intertrigo-no adequately responding to topical antifungals PLAN: -Discussed pt labwork today, 01/25/2021; counts and chemistries stable. -Recommended pt receive the Shingrix vaccines once her current infection has subsided. -Will start on antifungal (Fluconazole) and advised pt to keep the infected area dry. -Recommended pt take Benadryl at night and Claritin during the day daily for itching. Avoid hot, long showers. -Will repeat urine sample in a few months. -Will reduce the Dexamethasone to 10 mg/m^2. Will add Benadryl today only for premedications. -The pt has no  prohibitive toxicities from continuing [place]  at this time. Will continue Carflizomib at 56 mg/m.  -Continue Zometa q12 weeks  -Will see back in 4 weeks with labs. - Rx Fluconazole to [facility] on [address] in [place].   FOLLOW UP: Please schedule next 3 cycles [6 doses] ofcarfilzomib with port flush and labs Continue Zometa every 3 monthsx 3 doses Return to clinic with Dr. Irene Limbo in 4 weeks   The total time spent in the appointment was 30 minutes and more than 50% was on counseling and direct patient cares, ordering and management of chemotherapy   All of the patient's questions were answered with apparent satisfaction. The patient knows to call the clinic with any problems, questions or concerns.    Sullivan Lone MD [place] AAHIVMS [facility] Hematology/Oncology Physician [facility]  (Office):       [phone] (Work cell):  [phone] (Fax):           [phone]  I, Reinaldo Raddle, am acting as scribe for  Dr. Sullivan Lone, MD.   .I have reviewed the above documentation for accuracy and completeness, and I agree with the above. Brunetta Genera MD

## 2021-01-25 ENCOUNTER — Inpatient Hospital Stay: Payer: Medicare Other

## 2021-01-25 ENCOUNTER — Inpatient Hospital Stay (HOSPITAL_BASED_OUTPATIENT_CLINIC_OR_DEPARTMENT_OTHER): Payer: Medicare Other | Admitting: Hematology

## 2021-01-25 ENCOUNTER — Inpatient Hospital Stay: Payer: Medicare Other | Attending: Hematology

## 2021-01-25 ENCOUNTER — Other Ambulatory Visit: Payer: Self-pay

## 2021-01-25 ENCOUNTER — Ambulatory Visit: Payer: Medicare Other

## 2021-01-25 VITALS — BP 143/77 | HR 84 | Temp 98.3°F | Resp 16 | Ht 63.0 in | Wt 189.9 lb

## 2021-01-25 DIAGNOSIS — C9001 Multiple myeloma in remission: Secondary | ICD-10-CM

## 2021-01-25 DIAGNOSIS — Z23 Encounter for immunization: Secondary | ICD-10-CM

## 2021-01-25 DIAGNOSIS — C9000 Multiple myeloma not having achieved remission: Secondary | ICD-10-CM

## 2021-01-25 DIAGNOSIS — D72819 Decreased white blood cell count, unspecified: Secondary | ICD-10-CM | POA: Diagnosis not present

## 2021-01-25 DIAGNOSIS — Z5112 Encounter for antineoplastic immunotherapy: Secondary | ICD-10-CM | POA: Diagnosis not present

## 2021-01-25 DIAGNOSIS — Z95828 Presence of other vascular implants and grafts: Secondary | ICD-10-CM

## 2021-01-25 DIAGNOSIS — Z5111 Encounter for antineoplastic chemotherapy: Secondary | ICD-10-CM

## 2021-01-25 DIAGNOSIS — Z7189 Other specified counseling: Secondary | ICD-10-CM

## 2021-01-25 LAB — CBC WITH DIFFERENTIAL/PLATELET
Abs Immature Granulocytes: 0.01 10*3/uL (ref 0.00–0.07)
Basophils Absolute: 0 10*3/uL (ref 0.0–0.1)
Basophils Relative: 0 %
Eosinophils Absolute: 0.1 10*3/uL (ref 0.0–0.5)
Eosinophils Relative: 3 %
HCT: 32.7 % — ABNORMAL LOW (ref 36.0–46.0)
Hemoglobin: 10.6 g/dL — ABNORMAL LOW (ref 12.0–15.0)
Immature Granulocytes: 0 %
Lymphocytes Relative: 38 %
Lymphs Abs: 1.4 10*3/uL (ref 0.7–4.0)
MCH: 32 pg (ref 26.0–34.0)
MCHC: 32.4 g/dL (ref 30.0–36.0)
MCV: 98.8 fL (ref 80.0–100.0)
Monocytes Absolute: 0.4 10*3/uL (ref 0.1–1.0)
Monocytes Relative: 11 %
Neutro Abs: 1.8 10*3/uL (ref 1.7–7.7)
Neutrophils Relative %: 48 %
Platelets: 182 10*3/uL (ref 150–400)
RBC: 3.31 MIL/uL — ABNORMAL LOW (ref 3.87–5.11)
RDW: 14.3 % (ref 11.5–15.5)
WBC: 3.8 10*3/uL — ABNORMAL LOW (ref 4.0–10.5)
nRBC: 0 % (ref 0.0–0.2)

## 2021-01-25 LAB — CMP (CANCER CENTER ONLY)
ALT: 15 U/L (ref 0–44)
AST: 15 U/L (ref 15–41)
Albumin: 3.8 g/dL (ref 3.5–5.0)
Alkaline Phosphatase: 66 U/L (ref 38–126)
Anion gap: 10 (ref 5–15)
BUN: 9 mg/dL (ref 8–23)
CO2: 21 mmol/L — ABNORMAL LOW (ref 22–32)
Calcium: 8.3 mg/dL — ABNORMAL LOW (ref 8.9–10.3)
Chloride: 111 mmol/L (ref 98–111)
Creatinine: 0.75 mg/dL (ref 0.44–1.00)
GFR, Estimated: >60 mL/min (ref >60)
Glucose, Bld: 91 mg/dL (ref 70–99)
Potassium: 3.8 mmol/L (ref 3.5–5.1)
Sodium: 142 mmol/L (ref 135–145)
Total Bilirubin: 1.6 mg/dL — ABNORMAL HIGH (ref 0.3–1.2)
Total Protein: 6.2 g/dL — ABNORMAL LOW (ref 6.5–8.1)

## 2021-01-25 MED ORDER — FLUCONAZOLE 200 MG PO TABS: 200.0000 mg | ORAL_TABLET | Freq: Every day | ORAL | 0 refills | Status: DC

## 2021-01-25 MED ORDER — PROCHLORPERAZINE MALEATE 10 MG PO TABS
ORAL_TABLET | ORAL | Status: AC
  Filled 2021-01-25: qty 1

## 2021-01-25 MED ORDER — HEPARIN SOD (PORK) LOCK FLUSH 100 UNIT/ML IV SOLN
500.0000 [IU] | Freq: Once | INTRAVENOUS | Status: AC | PRN
  Administered 2021-01-25: 500 [IU]
  Filled 2021-01-25: qty 5

## 2021-01-25 MED ORDER — DEXTROSE 5 % IV SOLN: 56.0000 mg/m2 | Freq: Once | INTRAVENOUS | Status: DC

## 2021-01-25 MED ORDER — SODIUM CHLORIDE 0.9 % IV SOLN
10.0000 mg | Freq: Once | INTRAVENOUS | Status: AC
  Administered 2021-01-25: 10 mg via INTRAVENOUS
  Filled 2021-01-25: qty 10

## 2021-01-25 MED ORDER — ACETAMINOPHEN 500 MG PO TABS
ORAL_TABLET | ORAL | Status: AC
  Filled 2021-01-25: qty 2

## 2021-01-25 MED ORDER — DIPHENHYDRAMINE HCL 25 MG PO CAPS
25.0000 mg | ORAL_CAPSULE | Freq: Once | ORAL | Status: AC
Start: 2021-01-25 — End: 2021-01-25
  Administered 2021-01-25: 25 mg via ORAL

## 2021-01-25 MED ORDER — SODIUM CHLORIDE 0.9 % IV SOLN
Freq: Once | INTRAVENOUS | Status: AC
  Filled 2021-01-25: qty 250

## 2021-01-25 MED ORDER — ZOLEDRONIC ACID 4 MG/100ML IV SOLN
INTRAVENOUS | Status: AC
  Filled 2021-01-25: qty 100

## 2021-01-25 MED ORDER — DIPHENHYDRAMINE HCL 25 MG PO CAPS
ORAL_CAPSULE | ORAL | Status: AC
  Filled 2021-01-25: qty 1

## 2021-01-25 MED ORDER — DEXTROSE 5 % IV SOLN
56.0000 mg/m2 | Freq: Once | INTRAVENOUS | Status: AC
  Administered 2021-01-25: 120 mg via INTRAVENOUS
  Filled 2021-01-25: qty 60

## 2021-01-25 MED ORDER — PROCHLORPERAZINE MALEATE 10 MG PO TABS
10.0000 mg | ORAL_TABLET | Freq: Once | ORAL | Status: AC
  Administered 2021-01-25: 10 mg via ORAL

## 2021-01-25 MED ORDER — ZOLEDRONIC ACID 4 MG/100ML IV SOLN
4.0000 mg | Freq: Once | INTRAVENOUS | Status: AC
  Administered 2021-01-25: 4 mg via INTRAVENOUS

## 2021-01-25 MED ORDER — ACETAMINOPHEN 500 MG PO TABS
1000.0000 mg | ORAL_TABLET | Freq: Once | ORAL | Status: AC
Start: 2021-01-25 — End: 2021-01-25
  Administered 2021-01-25: 1000 mg via ORAL

## 2021-01-25 MED ORDER — SODIUM CHLORIDE 0.9% FLUSH
10.0000 mL | INTRAVENOUS | Status: DC | PRN
Start: 2021-01-25 — End: 2021-01-25
  Administered 2021-01-25: 10 mL
  Filled 2021-01-25: qty 10

## 2021-01-25 NOTE — Patient Instructions (Signed)
[facility] Discharge Instructions for Patients Receiving Chemotherapy  Today you received the following chemotherapy agents carflizomib  To help prevent nausea and vomiting after your treatment, we encourage you to take your nausea medication as directed.   If you develop nausea and vomiting that is not controlled by your nausea medication, call the clinic.   BELOW ARE SYMPTOMS THAT SHOULD BE REPORTED IMMEDIATELY:  *FEVER GREATER THAN 100.5 F  *CHILLS WITH OR WITHOUT FEVER  NAUSEA AND VOMITING THAT IS NOT CONTROLLED WITH YOUR NAUSEA MEDICATION  *UNUSUAL SHORTNESS OF BREATH  *UNUSUAL BRUISING OR BLEEDING  TENDERNESS IN MOUTH AND THROAT WITH OR WITHOUT PRESENCE OF ULCERS  *URINARY PROBLEMS  *BOWEL PROBLEMS  UNUSUAL RASH Items with * indicate a potential emergency and should be followed up as soon as possible.  Feel free to call the clinic should you have any questions or concerns. The clinic phone number is [phone].  Please show the [place] at check-in to the Emergency Department and triage nurse.  Zoledronic Acid Injection (Hypercalcemia, Oncology) What is this medicine? ZOLEDRONIC ACID (ZOE le dron ik AS id) slows calcium loss from bones. It high calcium levels in the blood from some kinds of cancer. It may be used in other people at risk for bone loss. This medicine may be used for other purposes; ask your health care provider or pharmacist if you have questions. COMMON BRAND NAME(S): Zometa What should I tell my health care provider before I take this medicine? They need to know if you have any of these conditions:  cancer  dehydration  dental disease  kidney disease  liver disease  low levels of calcium in the blood  lung or breathing disease (asthma)  receiving steroids like dexamethasone or prednisone  an unusual or allergic reaction to zoledronic acid, other medicines, foods, dyes, or preservatives  pregnant or trying to  get pregnant  breast-feeding How should I use this medicine? This drug is injected into a vein. It is given by a health care provider in a hospital or clinic setting. Talk to your health care provider about the use of this drug in children. Special care may be needed. Overdosage: If you think you have taken too much of this medicine contact a poison control center or emergency room at once. NOTE: This medicine is only for you. Do not share this medicine with others. What if I miss a dose? Keep appointments for follow-up doses. It is important not to miss your dose. Call your health care provider if you are unable to keep an appointment. What may interact with this medicine?  certain antibiotics given by injection  NSAIDs, medicines for pain and inflammation, like ibuprofen or naproxen  some diuretics like bumetanide, furosemide  teriparatide  thalidomide This list may not describe all possible interactions. Give your health care provider a list of all the medicines, herbs, non-prescription drugs, or dietary supplements you use. Also tell them if you smoke, drink alcohol, or use illegal drugs. Some items may interact with your medicine. What should I watch for while using this medicine? Visit your health care provider for regular checks on your progress. It may be some time before you see the benefit from this drug. Some people who take this drug have severe bone, joint, or muscle pain. This drug may also increase your risk for jaw problems or a broken thigh bone. Tell your health care provider right away if you have severe pain in your jaw, bones, joints,  or muscles. Tell you health care provider if you have any pain that does not go away or that gets worse. Tell your dentist and dental surgeon that you are taking this drug. You should not have major dental surgery while on this drug. See your dentist to have a dental exam and fix any dental problems before starting this drug. Take good care  of your teeth while on this drug. Make sure you see your dentist for regular follow-up appointments. You should make sure you get enough calcium and vitamin D while you are taking this drug. Discuss the foods you eat and the vitamins you take with your health care provider. Check with your health care provider if you have severe diarrhea, nausea, and vomiting, or if you sweat a lot. The loss of too much body fluid may make it dangerous for you to take this drug. You may need blood work done while you are taking this drug. Do not become pregnant while taking this drug. Women should inform their health care provider if they wish to become pregnant or think they might be pregnant. There is potential for serious harm to an unborn child. Talk to your health care provider for more information. What side effects may I notice from receiving this medicine? Side effects that you should report to your doctor or health care provider as soon as possible:  allergic reactions (skin rash, itching or hives; swelling of the face, lips, or tongue)  bone pain  infection (fever, chills, cough, sore throat, pain or trouble passing urine)  jaw pain, especially after dental work  joint pain  kidney injury (trouble passing urine or change in the amount of urine)  low blood pressure (dizziness; feeling faint or lightheaded, falls; unusually weak or tired)  low calcium levels (fast heartbeat; muscle cramps or pain; pain, tingling, or numbness in the hands or feet; seizures)  low magnesium levels (fast, irregular heartbeat; muscle cramp or pain; muscle weakness; tremors; seizures)  low red blood cell counts (trouble breathing; feeling faint; lightheaded, falls; unusually weak or tired)  muscle pain  redness, blistering, peeling, or loosening of the skin, including inside the mouth  severe diarrhea  swelling of the ankles, feet, hands  trouble breathing Side effects that usually do not require medical  attention (report to your doctor or health care provider if they continue or are bothersome):  anxious  constipation  coughing  depressed mood  eye irritation, itching, or pain  fever  general ill feeling or flu-like symptoms  nausea  pain, redness, or irritation at site where injected  trouble sleeping This list may not describe all possible side effects. Call your doctor for medical advice about side effects. You may report side effects to FDA at 1-800-FDA-1088. Where should I keep my medicine? This drug is given in a hospital or clinic. It will not be stored at home. NOTE: This sheet is a summary. It may not cover all possible information. If you have questions about this medicine, talk to your doctor, pharmacist, or health care provider.  2021 Elsevier/Gold Standard (2019-07-16 09:13:00)

## 2021-01-25 NOTE — Progress Notes (Unsigned)
Bili 1.6. Dr Irene Limbo aware. Per Dr Irene Limbo ok for pt to be treated today.

## 2021-01-26 ENCOUNTER — Telehealth: Payer: Self-pay | Admitting: Hematology

## 2021-01-26 NOTE — Telephone Encounter (Signed)
Scheduled appt per 4/13 los - pt to get an updated schedule next visit.

## 2021-01-31 IMAGING — CT NUCLEAR MEDICINE IMAGE RESTAGE (PS) WHOLE BODY PET/CT
1 of 9 series · 3 of 25 positions shown · non-contrast
Comparison: PET-CT 03/12/2017

CLINICAL DATA: Subsequent treatment strategy for multiple myeloma.

EXAM:
NUCLEAR MEDICINE PET WHOLE BODY
TECHNIQUE: 10.7 mCi F-18 FDG was injected intravenously. Full-ring PET imaging
was performed from the skull base to thigh after the radiotracer. CT
data was obtained and used for attenuation correction and anatomic
localization.
Fasting blood glucose: 88 mg/dl

[Series 4: ct wb 5.0 hd_fov · axial · 5.0mm · 1.27mm/px · z∈[+56,+936]mm · 3 of 441 slices shown]
[im 111/441  soft-tissue]
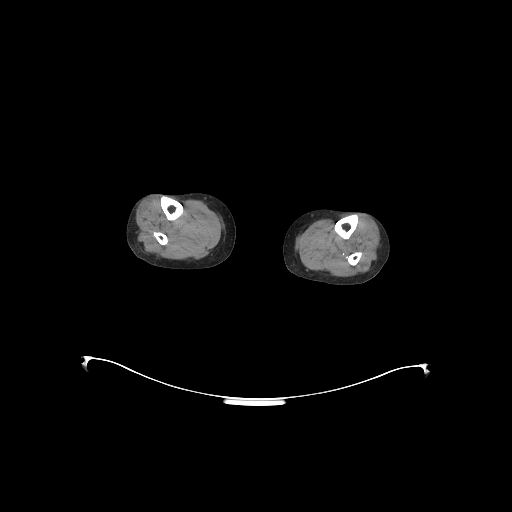
[im 221/441  soft-tissue]
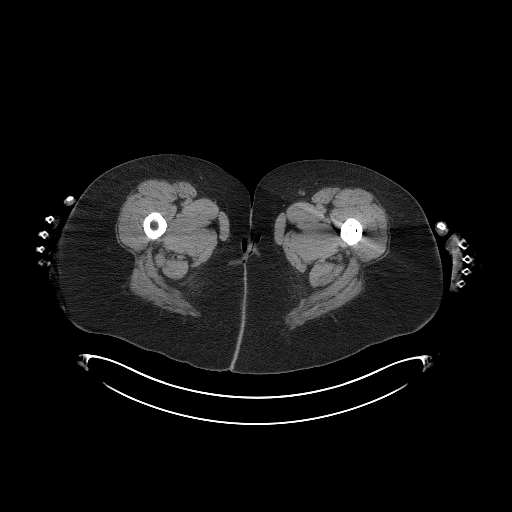
[im 331/441  soft-tissue]
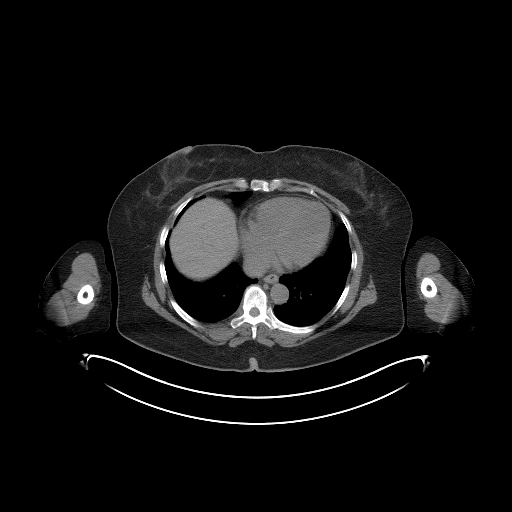

[3 of 25 positions shown; findings below may reference images not displayed]

FINDINGS: Mediastinal blood pool activity: SUV max

HEAD/NECK: No hypermetabolic activity in the scalp. No
hypermetabolic cervical lymph nodes.

Incidental CT findings: none

CHEST: No hypermetabolic mediastinal or hilar nodes. No suspicious
pulmonary nodules on the CT scan.

Incidental CT findings: none

ABDOMEN/PELVIS: No abnormal hypermetabolic activity within the
liver, pancreas, adrenal glands, or spleen. No hypermetabolic lymph
nodes in the abdomen or pelvis.

Incidental CT findings: Choose one gastric bypass anatomy. Post
hysterectomy.

SKELETON: No focal hypermetabolic activity to suggest skeletal
metastasis.

Incidental CT findings: none

EXTREMITIES: No abnormal hypermetabolic activity in the lower
extremities.

Incidental CT findings: LEFT hip prosthetic and bilateral knee
prosthetics.
IMPRESSION: 1. No FDG evidence of active multiple myeloma within the skeleton.
2. No evidence of lytic lesions within the skeleton or soft tissue
plasmacytoma.

## 2021-02-08 ENCOUNTER — Inpatient Hospital Stay: Payer: Medicare Other

## 2021-02-08 ENCOUNTER — Other Ambulatory Visit: Payer: Self-pay

## 2021-02-08 ENCOUNTER — Other Ambulatory Visit: Payer: Self-pay | Admitting: Hematology

## 2021-02-08 VITALS — BP 129/78 | HR 79 | Temp 97.8°F | Resp 16 | Wt 189.8 lb

## 2021-02-08 DIAGNOSIS — C9000 Multiple myeloma not having achieved remission: Secondary | ICD-10-CM

## 2021-02-08 DIAGNOSIS — Z5112 Encounter for antineoplastic immunotherapy: Secondary | ICD-10-CM | POA: Diagnosis not present

## 2021-02-08 DIAGNOSIS — Z23 Encounter for immunization: Secondary | ICD-10-CM

## 2021-02-08 DIAGNOSIS — Z95828 Presence of other vascular implants and grafts: Secondary | ICD-10-CM

## 2021-02-08 DIAGNOSIS — Z7189 Other specified counseling: Secondary | ICD-10-CM

## 2021-02-08 DIAGNOSIS — D72819 Decreased white blood cell count, unspecified: Secondary | ICD-10-CM | POA: Diagnosis not present

## 2021-02-08 DIAGNOSIS — C9001 Multiple myeloma in remission: Secondary | ICD-10-CM

## 2021-02-08 LAB — CBC WITH DIFFERENTIAL/PLATELET
Abs Immature Granulocytes: 0 10*3/uL (ref 0.00–0.07)
Basophils Absolute: 0 10*3/uL (ref 0.0–0.1)
Basophils Relative: 1 %
Eosinophils Absolute: 0.1 10*3/uL (ref 0.0–0.5)
Eosinophils Relative: 3 %
HCT: 33.9 % — ABNORMAL LOW (ref 36.0–46.0)
Hemoglobin: 11.2 g/dL — ABNORMAL LOW (ref 12.0–15.0)
Immature Granulocytes: 0 %
Lymphocytes Relative: 45 %
Lymphs Abs: 1.5 10*3/uL (ref 0.7–4.0)
MCH: 32.8 pg (ref 26.0–34.0)
MCHC: 33 g/dL (ref 30.0–36.0)
MCV: 99.4 fL (ref 80.0–100.0)
Monocytes Absolute: 0.5 10*3/uL (ref 0.1–1.0)
Monocytes Relative: 14 %
Neutro Abs: 1.2 10*3/uL — ABNORMAL LOW (ref 1.7–7.7)
Neutrophils Relative %: 37 %
Platelets: 176 10*3/uL (ref 150–400)
RBC: 3.41 MIL/uL — ABNORMAL LOW (ref 3.87–5.11)
RDW: 13.4 % (ref 11.5–15.5)
WBC: 3.2 10*3/uL — ABNORMAL LOW (ref 4.0–10.5)
nRBC: 0 % (ref 0.0–0.2)

## 2021-02-08 LAB — CMP (CANCER CENTER ONLY)
ALT: 17 U/L (ref 0–44)
AST: 22 U/L (ref 15–41)
Albumin: 3.8 g/dL (ref 3.5–5.0)
Alkaline Phosphatase: 64 U/L (ref 38–126)
Anion gap: 10 (ref 5–15)
BUN: 12 mg/dL (ref 8–23)
CO2: 22 mmol/L (ref 22–32)
Calcium: 8.9 mg/dL (ref 8.9–10.3)
Chloride: 110 mmol/L (ref 98–111)
Creatinine: 0.73 mg/dL (ref 0.44–1.00)
GFR, Estimated: >60 mL/min (ref >60)
Glucose, Bld: 83 mg/dL (ref 70–99)
Potassium: 3.8 mmol/L (ref 3.5–5.1)
Sodium: 142 mmol/L (ref 135–145)
Total Bilirubin: 0.8 mg/dL (ref 0.3–1.2)
Total Protein: 6.3 g/dL — ABNORMAL LOW (ref 6.5–8.1)

## 2021-02-08 MED ORDER — SODIUM CHLORIDE 0.9% FLUSH
10.0000 mL | Freq: Once | INTRAVENOUS | Status: AC
  Administered 2021-02-08: 10 mL
  Filled 2021-02-08: qty 10

## 2021-02-08 MED ORDER — ACETAMINOPHEN 500 MG PO TABS
ORAL_TABLET | ORAL | Status: AC
  Filled 2021-02-08: qty 2

## 2021-02-08 MED ORDER — SODIUM CHLORIDE 0.9% FLUSH
10.0000 mL | INTRAVENOUS | Status: DC | PRN
  Administered 2021-02-08: 10 mL
  Filled 2021-02-08: qty 10

## 2021-02-08 MED ORDER — HEPARIN SOD (PORK) LOCK FLUSH 100 UNIT/ML IV SOLN
500.0000 [IU] | Freq: Once | INTRAVENOUS | Status: AC | PRN
  Administered 2021-02-08: 500 [IU]
  Filled 2021-02-08: qty 5

## 2021-02-08 MED ORDER — PROCHLORPERAZINE MALEATE 10 MG PO TABS
10.0000 mg | ORAL_TABLET | Freq: Once | ORAL | Status: AC
  Administered 2021-02-08: 10 mg via ORAL

## 2021-02-08 MED ORDER — ACETAMINOPHEN 500 MG PO TABS
1000.0000 mg | ORAL_TABLET | Freq: Once | ORAL | Status: AC
  Administered 2021-02-08: 1000 mg via ORAL

## 2021-02-08 MED ORDER — SODIUM CHLORIDE 0.9 % IV SOLN
10.0000 mg | Freq: Once | INTRAVENOUS | Status: AC
  Administered 2021-02-08: 10 mg via INTRAVENOUS
  Filled 2021-02-08: qty 10

## 2021-02-08 MED ORDER — SODIUM CHLORIDE 0.9 % IV SOLN
Freq: Once | INTRAVENOUS | Status: AC
  Filled 2021-02-08: qty 250

## 2021-02-08 MED ORDER — DEXTROSE 5 % IV SOLN
56.0000 mg/m2 | Freq: Once | INTRAVENOUS | Status: AC
  Administered 2021-02-08: 120 mg via INTRAVENOUS
  Filled 2021-02-08: qty 60

## 2021-02-08 MED ORDER — PROCHLORPERAZINE MALEATE 10 MG PO TABS
ORAL_TABLET | ORAL | Status: AC
  Filled 2021-02-08: qty 1

## 2021-02-08 NOTE — Patient Instructions (Signed)
Implanted Port Insertion, Care After This sheet gives you information about how to care for yourself after your procedure. Your health care provider may also give you more specific instructions. If you have problems or questions, contact your health care provider. What can I expect after the procedure? After the procedure, it is common to have:  Discomfort at the port insertion site.  Bruising on the skin over the port. This should improve over 3-4 days. Follow these instructions at home: Port care  After your port is placed, you will get a manufacturer's information card. The card has information about your port. Keep this card with you at all times.  Take care of the port as told by your health care provider. Ask your health care provider if you or a family member can get training for taking care of the port at home. A home health care nurse may also take care of the port.  Make sure to remember what type of port you have. Incision care  Follow instructions from your health care provider about how to take care of your port insertion site. Make sure you: ? Wash your hands with soap and water before and after you change your bandage (dressing). If soap and water are not available, use hand sanitizer. ? Change your dressing as told by your health care provider. ? Leave stitches (sutures), skin glue, or adhesive strips in place. These skin closures may need to stay in place for 2 weeks or longer. If adhesive strip edges start to loosen and curl up, you may trim the loose edges. Do not remove adhesive strips completely unless your health care provider tells you to do that.  Check your port insertion site every day for signs of infection. Check for: ? Redness, swelling, or pain. ? Fluid or blood. ? Warmth. ? Pus or a bad smell.      Activity  Return to your normal activities as told by your health care provider. Ask your health care provider what activities are safe for you.  Do not  lift anything that is heavier than 10 lb (4.5 kg), or the limit that you are told, until your health care provider says that it is safe. General instructions  Take over-the-counter and prescription medicines only as told by your health care provider.  Do not take baths, swim, or use a hot tub until your health care provider approves. Ask your health care provider if you may take showers. You may only be allowed to take sponge baths.  Do not drive for 24 hours if you were given a sedative during your procedure.  Wear a medical alert bracelet in case of an emergency. This will tell any health care providers that you have a port.  Keep all follow-up visits as told by your health care provider. This is important. Contact a health care provider if:  You cannot flush your port with saline as directed, or you cannot draw blood from the port.  You have a fever or chills.  You have redness, swelling, or pain around your port insertion site.  You have fluid or blood coming from your port insertion site.  Your port insertion site feels warm to the touch.  You have pus or a bad smell coming from the port insertion site. Get help right away if:  You have chest pain or shortness of breath.  You have bleeding from your port that you cannot control. Summary  Take care of the port as told by your   health care provider. Keep the manufacturer's information card with you at all times.  Change your dressing as told by your health care provider.  Contact a health care provider if you have a fever or chills or if you have redness, swelling, or pain around your port insertion site.  Keep all follow-up visits as told by your health care provider. This information is not intended to replace advice given to you by your health care provider. Make sure you discuss any questions you have with your health care provider. Document Revised: 04/29/2018 Document Reviewed: 04/29/2018 Elsevier Patient Education   2021 [employer]  

## 2021-02-08 NOTE — Progress Notes (Signed)
Per Dr. Irene Limbo, okay for patient to receive treatment today with [place] 1.2.

## 2021-02-08 NOTE — Patient Instructions (Signed)
[facility] Discharge Instructions for Patients Receiving Chemotherapy  Today you received the following chemotherapy agents carflizomib  To help prevent nausea and vomiting after your treatment, we encourage you to take your nausea medication as directed.   If you develop nausea and vomiting that is not controlled by your nausea medication, call the clinic.   BELOW ARE SYMPTOMS THAT SHOULD BE REPORTED IMMEDIATELY:  *FEVER GREATER THAN 100.5 F  *CHILLS WITH OR WITHOUT FEVER  NAUSEA AND VOMITING THAT IS NOT CONTROLLED WITH YOUR NAUSEA MEDICATION  *UNUSUAL SHORTNESS OF BREATH  *UNUSUAL BRUISING OR BLEEDING  TENDERNESS IN MOUTH AND THROAT WITH OR WITHOUT PRESENCE OF ULCERS  *URINARY PROBLEMS  *BOWEL PROBLEMS  UNUSUAL RASH Items with * indicate a potential emergency and should be followed up as soon as possible.  Feel free to call the clinic should you have any questions or concerns. The clinic phone number is [phone].  Please show the CHEMO ALERT CARD at check-in to the Emergency Department and triage nurse.  Zoledronic Acid Injection (Hypercalcemia, Oncology) What is this medicine? ZOLEDRONIC ACID (ZOE le dron ik AS id) slows calcium loss from bones. It high calcium levels in the blood from some kinds of cancer. It may be used in other people at risk for bone loss. This medicine may be used for other purposes; ask your health care provider or pharmacist if you have questions. COMMON BRAND NAME(S): Zometa What should I tell my health care provider before I take this medicine? They need to know if you have any of these conditions:  cancer  dehydration  dental disease  kidney disease  liver disease  low levels of calcium in the blood  lung or breathing disease (asthma)  receiving steroids like dexamethasone or prednisone  an unusual or allergic reaction to zoledronic acid, other medicines, foods, dyes, or preservatives  pregnant or trying to  get pregnant  breast-feeding How should I use this medicine? This drug is injected into a vein. It is given by a health care provider in a hospital or clinic setting. Talk to your health care provider about the use of this drug in children. Special care may be needed. Overdosage: If you think you have taken too much of this medicine contact a poison control center or emergency room at once. NOTE: This medicine is only for you. Do not share this medicine with others. What if I miss a dose? Keep appointments for follow-up doses. It is important not to miss your dose. Call your health care provider if you are unable to keep an appointment. What may interact with this medicine?  certain antibiotics given by injection  NSAIDs, medicines for pain and inflammation, like ibuprofen or naproxen  some diuretics like bumetanide, furosemide  teriparatide  thalidomide This list may not describe all possible interactions. Give your health care provider a list of all the medicines, herbs, non-prescription drugs, or dietary supplements you use. Also tell them if you smoke, drink alcohol, or use illegal drugs. Some items may interact with your medicine. What should I watch for while using this medicine? Visit your health care provider for regular checks on your progress. It may be some time before you see the benefit from this drug. Some people who take this drug have severe bone, joint, or muscle pain. This drug may also increase your risk for jaw problems or a broken thigh bone. Tell your health care provider right away if you have severe pain in your jaw, bones, joints,   or muscles. Tell you health care provider if you have any pain that does not go away or that gets worse. Tell your dentist and dental surgeon that you are taking this drug. You should not have major dental surgery while on this drug. See your dentist to have a dental exam and fix any dental problems before starting this drug. Take good care  of your teeth while on this drug. Make sure you see your dentist for regular follow-up appointments. You should make sure you get enough calcium and vitamin D while you are taking this drug. Discuss the foods you eat and the vitamins you take with your health care provider. Check with your health care provider if you have severe diarrhea, nausea, and vomiting, or if you sweat a lot. The loss of too much body fluid may make it dangerous for you to take this drug. You may need blood work done while you are taking this drug. Do not become pregnant while taking this drug. Women should inform their health care provider if they wish to become pregnant or think they might be pregnant. There is potential for serious harm to an unborn child. Talk to your health care provider for more information. What side effects may I notice from receiving this medicine? Side effects that you should report to your doctor or health care provider as soon as possible:  allergic reactions (skin rash, itching or hives; swelling of the face, lips, or tongue)  bone pain  infection (fever, chills, cough, sore throat, pain or trouble passing urine)  jaw pain, especially after dental work  joint pain  kidney injury (trouble passing urine or change in the amount of urine)  low blood pressure (dizziness; feeling faint or lightheaded, falls; unusually weak or tired)  low calcium levels (fast heartbeat; muscle cramps or pain; pain, tingling, or numbness in the hands or feet; seizures)  low magnesium levels (fast, irregular heartbeat; muscle cramp or pain; muscle weakness; tremors; seizures)  low red blood cell counts (trouble breathing; feeling faint; lightheaded, falls; unusually weak or tired)  muscle pain  redness, blistering, peeling, or loosening of the skin, including inside the mouth  severe diarrhea  swelling of the ankles, feet, hands  trouble breathing Side effects that usually do not require medical  attention (report to your doctor or health care provider if they continue or are bothersome):  anxious  constipation  coughing  depressed mood  eye irritation, itching, or pain  fever  general ill feeling or flu-like symptoms  nausea  pain, redness, or irritation at site where injected  trouble sleeping This list may not describe all possible side effects. Call your doctor for medical advice about side effects. You may report side effects to FDA at 1-800-FDA-1088. Where should I keep my medicine? This drug is given in a hospital or clinic. It will not be stored at home. NOTE: This sheet is a summary. It may not cover all possible information. If you have questions about this medicine, talk to your doctor, pharmacist, or health care provider.  2021 [employer] (2019-07-16 09:13:00)  

## 2021-02-09 LAB — KAPPA/LAMBDA LIGHT CHAINS
Kappa free light chain: 9.7 mg/L (ref 3.3–19.4)
Kappa, lambda light chain ratio: 1.2 (ref 0.26–1.65)
Lambda free light chains: 8.1 mg/L (ref 5.7–26.3)

## 2021-02-17 ENCOUNTER — Other Ambulatory Visit: Payer: Self-pay

## 2021-02-17 DIAGNOSIS — C9000 Multiple myeloma not having achieved remission: Secondary | ICD-10-CM

## 2021-02-17 MED ORDER — FLUCONAZOLE 200 MG PO TABS: 200.0000 mg | ORAL_TABLET | Freq: Every day | ORAL | 0 refills | Status: DC

## 2021-02-21 MED FILL — Dexamethasone Sodium Phosphate Inj 100 MG/10ML: INTRAMUSCULAR | Qty: 1 | Status: AC

## 2021-02-21 NOTE — Progress Notes (Signed)
HEMATOLOGY/ONCOLOGY CLINIC NOTE  Date of Service: 02/22/21     PCP: Marzetta Board MD  CHIEF COMPLAINTS/PURPOSE OF CONSULTATION:  Continued f/u for mx of myeloma   HISTORY OF PRESENTING ILLNESS:   Madison Parker is a wonderful 70 y.o. 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  Patient has a history of smoldering multiple myeloma which she reports she was diagnosed with in 2011 when she was evaluated by a hematologist in [place]. She reports that she presented with low blood counts (low WBC)   and after significant workup she had a bone marrow examination based on which she was told that she has smoldering multiple myeloma. Patient notes that she had a bone survey at that time which was negative. She was monitored there closely and then moved to [facility] in 2015 to stay with her son whose wife was being treated for breast cancer. Patient was following with Dr. Delight Hoh in [place]. She reports not having had any treatment for multiple myeloma.  Her last labs from about a year ago showed a SPEP with no OBSERVED M spike . Serum free light chains showed an elevation of kappa Free light chain 310 and lambda free light chain of 12.7 with an abnormal Kappa/ Lambda ratio of 24.38 ( up from 19 previously). Random urine showed an M protein complement of 44%.  She lives in [place] and requested transfer of care to [place]. She was offered follow-up but [place] in [place]  But prefers to  follow [place] in [place]. Patient reports her energy levels been stable. She has chronic back pain which has improved since the patient had her spinal surgery in April 2017 (L4-L5 interbody fusion). Patient notes she has had some chronic left hip pain which is somewhat more bothersome with the navy on the lateral aspect of her upper thighs that's painful. She also notes some right hip pain.  Over the last few weeks he notes some upper neck pain especially when bending her neck backwards and tingling numbness in her right upper extremity.  Has been working on losing some weight voluntarily.  No acute other new symptoms.  AUTOLOGOUS STEM CELL TRANSPLANT:  Disease status at time of transplant: sCR MRD positive  ASBMT risk stratification: low risk, ECOG/KPS: 0 / 90%, HSCT-CI score: 0  Started mobilization on 09/03/19 using GCS-F and Plerixafor. She collected stem cells on 09/07/19. The pre-processing total was 7.18x10(6) CD34+ cells/kg. The post-processing total was 1.11x10(7) CD34+ cells/kg frozen in 3 bags.  Preparative regimen with Melphalan 200 mg/m2 on 09/14/19 (outpatient).  Infusion of stem cells - 7.4 x 10(6) on 09/15/19 (outpatient)  Admitted Day +8 for neutropenic fever, uncontrolled n/v, diarrhea  Treated with short course of prednisone for peri-engraftment syndrome  TREATMENT:  Started induction therapy with carfilozomib, lenlaidomide, and dexamethasone 28 day cycle on 04/27/2019. She had a great response to initial cycle. She developed a DVT on 06/24/19 and was started on apixaban as an anticoagulant. She had normalization of her light chains by the start of C4 on 07/28/19.  She was referred to the myeloma clinic at [facility] for BMT consult and seen on 08/10/19. Serology from her visit showed no M-spike by SPEP, a free light chain ratio of 1.07 (kappa 11.42, lambda 10.65), and an abnormal beta-2-micro of 3.02. She was started on pre-transplant evaluation while undergoing C5 of therapy.  Treatment discontinued in 07/2019 in  preparation for autologous stem cell transplant.   INTERVAL HISTORY:   Madison Parker returns today for f/u of her multiple myeloma. She is s/p HDT -Auto HSCT on 09/15/2019. She is here for maintenance Carfilzomib. The patient's last visit with [place] was on 01/25/2021. The pt reports that she is doing well overall.  The pt reports that the Fluconazole  helped improved the rash she was having. The pt notes that this is going away, but typically comes back on chemotherapy treatment days. The pt notes the rash on stomach is cleared up mostly, but is still in other areas. She has stopped taking showers every day and is doing all precautionary measures as needed. The pt notes the rash is worse when she sweats. The pt notes she is currently taking care of her mother.  Lab results today 02/22/2021 of CBC w/diff and CMP is as follows: all values are WNL except for RBC of 3.29, Hgb of 10.8, HCT of 32.9. CMP pending. 02/22/2021 Light Chains wnl 02/22/2021 MMP in progress.  On review of systems, pt reports continued candida infection primarily in groin area, chronic hip pain and denies generalized itching, back pain, abdominal pain, leg swelling, and any other symptoms.  MEDICAL HISTORY:  Past Medical History:  Diagnosis Date  . Headache   . Low back pain   . SBO (small bowel obstruction) ([place]) 2010  . Smoldering multiple myeloma (HCC)   Previous history of hypothyroidism- not currently medications Anxiety Obesity .Body mass index is 34.24 kg/m. Vitamin D deficiency Smoldering multiple myeloma diagnosed in 2011 Lumbosacral radiculopathy Left hip pain  SURGICAL HISTORY: Past Surgical History:  Procedure Laterality Date  . ABDOMINAL HYSTERECTOMY     complete  . BACK SURGERY     lower at [facility]  . COLONOSCOPY WITH PROPOFOL N/A 08/23/2020   Procedure: COLONOSCOPY WITH PROPOFOL;  Surgeon: Harvel Quale, MD;  [facility];  Service: Gastroenterology;  Laterality: N/A;  900  . colonscopy  2014  . GASTRIC BYPASS  yrs ago  . HERNIA REPAIR    . IR IMAGING GUIDED PORT INSERTION  04/20/2019  . KNEE SURGERY  06/04/2009   both knees replaced   . POLYPECTOMY  08/23/2020   Procedure: POLYPECTOMY;  Surgeon: Harvel Quale, MD;  Location: [facility];  Service: Gastroenterology;;  . TONSILLECTOMY  age 86  . TOTAL HIP  ARTHROPLASTY Left 08/16/2017   Procedure: LEFT TOTAL HIP ARTHROPLASTY ANTERIOR APPROACH;  Surgeon: Dorna Leitz, MD;  Location: [facility];  Service: Orthopedics;  Laterality: Left;  Spinal surgery April 2017  SOCIAL HISTORY: Social History   Socioeconomic History  . Marital status: Married    Spouse name: Not on file  . Number of children: Not on file  . Years of education: Not on file  . Highest education level: Not on file  Occupational History  . Not on file  Tobacco Use  . Smoking status: Former Smoker    Packs/day: 0.50    Years: 10.00    Pack years: 5.00    Types: Cigarettes  . Smokeless tobacco: Never Used  . Tobacco comment: quit 1977  Vaping Use  . Vaping Use: Never used  Substance and Sexual Activity  . Alcohol use: Yes    Comment: occ  . Drug use: No  . Sexual activity: Not on file  Other Topics Concern  . Not on file  Social History Narrative  . Not on file   Social Determinants of Health   Financial Resource Strain:  Not on file  Food Insecurity: Not on file  Transportation Needs: Not on file  Physical Activity: Not on file  Stress: Not on file  Social Connections: Not on file  Intimate Partner Violence: Not on file  Occasional alcohol use Former smoker and smoked 1 pack per week for about 9 years has since quit.  FAMILY HISTORY: No family history on file.  ALLERGIES:  is allergic to codeine.  MEDICATIONS:  Current Outpatient Medications  Medication Sig Dispense Refill  . acyclovir (ZOVIRAX) 400 MG tablet Take 1 tablet (400 mg total) by mouth 2 (two) times daily. (Patient taking differently: Take 800 mg by mouth daily. ) 180 tablet 0  . fluconazole (DIFLUCAN) 200 MG tablet Take 1 tablet (200 mg total) by mouth daily. 10 tablet 0  . FOLIC ACID PO Take 9,292 mg by mouth daily.     Marland Kitchen lidocaine-prilocaine (EMLA) cream Apply 1 application topically as needed. 30 g 2  . NYSTATIN powder Apply topically 2 (two) times daily. 15 g 1  . nystatin-triamcinolone  (MYCOLOG II) cream Apply topically 2 (two) times daily. 30 g 1  . ondansetron (ZOFRAN) 8 MG tablet Take 1 tablet (8 mg total) by mouth 2 (two) times daily as needed (Nausea or vomiting). (Patient not taking: Reported on 08/17/2020) 30 tablet 1  . Probiotic Product (PROBIOTIC DAILY PO) Take 2 capsules by mouth daily. Gummies    . prochlorperazine (COMPAZINE) 10 MG tablet Take 1 tablet (10 mg total) by mouth every 6 (six) hours as needed for nausea or vomiting. (Patient not taking: Reported on 08/17/2020) 30 tablet 0  . TURMERIC-GINGER PO Take 2 each by mouth daily.     . vitamin B-12 (CYANOCOBALAMIN) 1000 MCG tablet Take 1,000 mcg by mouth daily.     . Vitamin D, Ergocalciferol, (DRISDOL) 1.25 MG (50000 UNIT) CAPS capsule TAKE 1 CAPSULE ONCE A WEEK (Patient taking differently: Take 50,000 Units by mouth every 7 (seven) days. Sunday) 13 capsule 3   No current facility-administered medications for this visit.   Facility-Administered Medications Ordered in Other Visits  Medication Dose Route Frequency Provider Last Rate Last Admin  . sodium chloride flush (NS) 0.9 % injection 10 mL  10 mL Intracatheter PRN Brunetta Genera, MD   10 mL at 05/11/19 1437    REVIEW OF SYSTEMS:  10 Point review of Systems was done is negative except as noted above.   PHYSICAL EXAMINATION: ECOG FS:1 - Symptomatic but completely ambulatory  Vitals:   02/22/21 1030  BP: 140/79  Pulse: 64  Resp: 17  Temp: 97.7 F (36.5 C)  SpO2: 100%   Wt Readings from Last 3 Encounters:  02/22/21 193 lb 4.8 oz (87.7 kg)  02/08/21 189 lb 12 oz (86.1 kg)  01/25/21 189 lb 14.4 oz (86.1 kg)   Body mass index is 34.24 kg/m.     GENERAL:alert, in no acute distress and comfortable SKIN: no significant lesions. Current rash on stomach and back. EYES: conjunctiva are pink and non-injected, sclera anicteric OROPHARYNX: MMM, no exudates, no oropharyngeal erythema or ulceration NECK: supple, no JVD LYMPH:  no palpable  lymphadenopathy in the cervical, axillary or inguinal regions LUNGS: clear to auscultation b/l with normal respiratory effort HEART: regular rate & rhythm ABDOMEN:  normoactive bowel sounds , non tender, not distended. Extremity: no pedal edema PSYCH: alert & oriented x 3 with fluent speech NEURO: no focal motor/sensory deficits  LABORATORY DATA:  I have reviewed the data as listed  . CBC Latest  Ref Rng & Units 02/22/2021 02/08/2021 01/25/2021  WBC 4.0 - 10.5 K/uL 4.1 3.2(L) 3.8(L)  Hemoglobin 12.0 - 15.0 g/dL 10.8(L) 11.2(L) 10.6(L)  Hematocrit 36.0 - 46.0 % 32.9(L) 33.9(L) 32.7(L)  Platelets 150 - 400 K/uL 201 176 182  ANC 1300 . CBC    Component Value Date/Time   WBC 4.1 02/22/2021 1006   RBC 3.29 (L) 02/22/2021 1006   HGB 10.8 (L) 02/22/2021 1006   HGB 11.3 (L) 11/12/2019 0836   HGB 11.3 (L) 09/30/2017 1145   HCT 32.9 (L) 02/22/2021 1006   HCT 35.1 09/30/2017 1145   PLT 201 02/22/2021 1006   PLT 149 (L) 11/12/2019 0836   PLT 172 09/30/2017 1145   MCV 100.0 02/22/2021 1006   MCV 97.5 09/30/2017 1145   MCH 32.8 02/22/2021 1006   MCHC 32.8 02/22/2021 1006   RDW 13.3 02/22/2021 1006   RDW 14.0 09/30/2017 1145   LYMPHSABS 1.6 02/22/2021 1006   LYMPHSABS 1.3 09/30/2017 1145   MONOABS 0.5 02/22/2021 1006   MONOABS 0.2 09/30/2017 1145   EOSABS 0.1 02/22/2021 1006   EOSABS 0.0 09/30/2017 1145   EOSABS 0.1 06/03/2014 1003   BASOSABS 0.1 02/22/2021 1006   BASOSABS 0.0 09/30/2017 1145     . CMP Latest Ref Rng & Units 02/22/2021 02/08/2021 01/25/2021  Glucose 70 - 99 mg/dL 83 83 91  BUN 8 - 23 mg/dL 14 12 9   Creatinine 0.44 - 1.00 mg/dL 0.78 0.73 0.75  Sodium 135 - 145 mmol/L 142 142 142  Potassium 3.5 - 5.1 mmol/L 4.0 3.8 3.8  Chloride 98 - 111 mmol/L 110 110 111  CO2 22 - 32 mmol/L 23 22 21(L)  Calcium 8.9 - 10.3 mg/dL 9.1 8.9 8.3(L)  Total Protein 6.5 - 8.1 g/dL 6.0(L) 6.3(L) 6.2(L)  Total Bilirubin 0.3 - 1.2 mg/dL 1.0 0.8 1.6(H)  Alkaline Phos 38 - 126 U/L 60 64 66   AST 15 - 41 U/L 17 22 15   ALT 0 - 44 U/L 16 17 15     06/08/2020 K/L light chains:         Component     Latest Ref Rng & Units 11/08/2016  LDH     125 - 245 U/L 220  Beta 2     0.6 - 2.4 mg/L 2.5 (H)   Component     Latest Ref Rng & Units 07/30/2017  Ferritin     9 - 269 ng/ml 81  Vitamin B12     232 - 1,245 pg/mL 594   Magnesium: Component Magnesium  Latest Ref Rng & Units 1.7 - 2.4 mg/dL  10/02/2019 1.7  10/21/2019 1.7  11/12/2019 1.7    03/11/19 BM Bx:   03/11/19 Cytogenetics:            RADIOGRAPHIC STUDIES: I have personally reviewed the radiological images as listed and agreed with the findings in the report. No results found.   Bone survey 11/08/2016 IMPRESSION: 1. Small lytic lesion in the right scapula. 2. Possible small lytic lesion in the left scapula. 3. Postoperative changes. 4. Significant degenerative changes in both hips, left greater than right. 5. No evidence for acute fracture   Electronically Signed   By: Nolon Nations M.D.   On: 11/08/2016 16:19   ASSESSMENT & PLAN:   70 y.o. very pleasant lady with history of   1) Multiple myeloma (concern for small lytic lesions in left and right scapulae) - (appears light chain producing) and Progressive anemia  This was apparently diagnosed  as smoldering myeloma in 2011 by her hematologist in [place].  No renal failure/hypercalcemia.  Bone survey with concern for possible small lytic lesions in B/L scapulae. PET/CT scan on 03/12/2017 showed no concerning bone lesions.  Initial Bm Bx with 17% kappa restricted plasma cells consistent with plasma cell neoplasm.  No treatment so far.  03/11/19 BM Bx revealed involvement by 50% plasma cells; genetics -high risk t(14;16), previous genetics from April 2018 BM Bx were standard risk  03/16/19 MRI Lumbar reveals several explanations for her lower back pain including L2-L3 stenosis, but reassuringly, there was not evidence of  involvement by multiple myeloma  04/01/19 PET/CT which revealed no FDG evidence of active multiple myeloma within the skeleton. No evidence of lytic lesions within the skeleton or soft tissue Plasmacytoma.  05/07/2019 DXA scan revealed osteopenia, T-score of -1.2.  2) Chronic leukopenia - ? Related to SMM vs other nutritional deficiencies (h/o gastric bypass surgery put her at risk for nutritional deficiencies).  WBC counts of have been close to normal recently today at 3.8k with an [place] of 1600.  B12 levels WNL  Ferritin adequate  ?additional factor -recent NSAID use.  ?element of benign ethnic neutropenia.  Has not had any issues with frequent infection.     4) Neuropathy/radiculopathy -Continue follow up with orthopedics  5) Superficial Venous Thrombosis of the left small saphenous vein  06/24/2019 US venous left : Right: There is no evidence of deep vein thrombosis in the lower extremity. No cystic structure found in the popliteal fossa. Left: Findings consistent with acute superficial vein thrombosis involving the left small saphenous vein. There is no evidence of deep vein thrombosis in the lower extremity. However, portions of this examination were limited- see technologist comments above. No cystic structure found in the popliteal fossa  6) Candida intertrigo-no adequately responding to topical antifungals  PLAN: -Discussed pt labwork today, 02/22/2021; blood counts stable, K/L light chains and ratio wnl. -Advised pt it would not be unreasonable to take salt baths or use soap with colloidal silver. Recommended OTC Nizoral shampoo for cleaning different areas. -Recommended pt wear loose fitting clothing. -Will reduce the Dexamethasone dosage again to 6 mg with treatment -The pt has no prohibitive toxicities from continuing C16D15 Carfilzomib  at this time. Will continue Carflizomib at 56 mg/m.  -Continue Zometa q12 weeks  -Will see back in 4 weeks with labs.   FOLLOW  UP: -Please schedule next 3 cycles [6 doses] of Carfilzomib with port flush and labs -Continue Zometa every 3 months x 3 doses -Return to clinic with Dr. Irene Limbo in 4 weeks   The total time spent in the appointment was 30 minutes and more than 50% was on counseling and direct patient cares, ordering and mx of chemotherapy    All of the patient's questions were answered with apparent satisfaction. The patient knows to call the clinic with any problems, questions or concerns.    Sullivan Lone MD [place] [facility] Hematology/Oncology Physician [facility]  (Office):       [phone] (Work cell):  [phone] (Fax):           [phone]  I, Reinaldo Raddle, am acting as scribe for Dr. Sullivan Lone, MD.   .I have reviewed the above documentation for accuracy and completeness, and I agree with the above. Brunetta Genera MD

## 2021-02-22 ENCOUNTER — Inpatient Hospital Stay: Payer: Medicare Other | Attending: Hematology

## 2021-02-22 ENCOUNTER — Inpatient Hospital Stay: Payer: Medicare Other

## 2021-02-22 ENCOUNTER — Other Ambulatory Visit: Payer: Self-pay

## 2021-02-22 ENCOUNTER — Other Ambulatory Visit: Payer: Medicare Other

## 2021-02-22 ENCOUNTER — Inpatient Hospital Stay (HOSPITAL_BASED_OUTPATIENT_CLINIC_OR_DEPARTMENT_OTHER): Payer: Medicare Other | Admitting: Hematology

## 2021-02-22 VITALS — BP 140/79 | HR 64 | Temp 97.7°F | Resp 17 | Ht 63.0 in | Wt 193.3 lb

## 2021-02-22 DIAGNOSIS — C9001 Multiple myeloma in remission: Secondary | ICD-10-CM

## 2021-02-22 DIAGNOSIS — G629 Polyneuropathy, unspecified: Secondary | ICD-10-CM | POA: Diagnosis not present

## 2021-02-22 DIAGNOSIS — L304 Erythema intertrigo: Secondary | ICD-10-CM | POA: Diagnosis not present

## 2021-02-22 DIAGNOSIS — Z23 Encounter for immunization: Secondary | ICD-10-CM

## 2021-02-22 DIAGNOSIS — Z5112 Encounter for antineoplastic immunotherapy: Secondary | ICD-10-CM | POA: Insufficient documentation

## 2021-02-22 DIAGNOSIS — D72819 Decreased white blood cell count, unspecified: Secondary | ICD-10-CM | POA: Insufficient documentation

## 2021-02-22 DIAGNOSIS — C9000 Multiple myeloma not having achieved remission: Secondary | ICD-10-CM

## 2021-02-22 DIAGNOSIS — Z5111 Encounter for antineoplastic chemotherapy: Secondary | ICD-10-CM

## 2021-02-22 DIAGNOSIS — Z7189 Other specified counseling: Secondary | ICD-10-CM

## 2021-02-22 LAB — CBC WITH DIFFERENTIAL/PLATELET
Abs Immature Granulocytes: 0.01 10*3/uL (ref 0.00–0.07)
Basophils Absolute: 0.1 10*3/uL (ref 0.0–0.1)
Basophils Relative: 2 %
Eosinophils Absolute: 0.1 10*3/uL (ref 0.0–0.5)
Eosinophils Relative: 2 %
HCT: 32.9 % — ABNORMAL LOW (ref 36.0–46.0)
Hemoglobin: 10.8 g/dL — ABNORMAL LOW (ref 12.0–15.0)
Immature Granulocytes: 0 %
Lymphocytes Relative: 40 %
Lymphs Abs: 1.6 10*3/uL (ref 0.7–4.0)
MCH: 32.8 pg (ref 26.0–34.0)
MCHC: 32.8 g/dL (ref 30.0–36.0)
MCV: 100 fL (ref 80.0–100.0)
Monocytes Absolute: 0.5 10*3/uL (ref 0.1–1.0)
Monocytes Relative: 11 %
Neutro Abs: 1.9 10*3/uL (ref 1.7–7.7)
Neutrophils Relative %: 45 %
Platelets: 201 10*3/uL (ref 150–400)
RBC: 3.29 MIL/uL — ABNORMAL LOW (ref 3.87–5.11)
RDW: 13.3 % (ref 11.5–15.5)
WBC: 4.1 10*3/uL (ref 4.0–10.5)
nRBC: 0 % (ref 0.0–0.2)

## 2021-02-22 LAB — CMP (CANCER CENTER ONLY)
ALT: 16 U/L (ref 0–44)
AST: 17 U/L (ref 15–41)
Albumin: 3.8 g/dL (ref 3.5–5.0)
Alkaline Phosphatase: 60 U/L (ref 38–126)
Anion gap: 9 (ref 5–15)
BUN: 14 mg/dL (ref 8–23)
CO2: 23 mmol/L (ref 22–32)
Calcium: 9.1 mg/dL (ref 8.9–10.3)
Chloride: 110 mmol/L (ref 98–111)
Creatinine: 0.78 mg/dL (ref 0.44–1.00)
GFR, Estimated: >60 mL/min (ref >60)
Glucose, Bld: 83 mg/dL (ref 70–99)
Potassium: 4 mmol/L (ref 3.5–5.1)
Sodium: 142 mmol/L (ref 135–145)
Total Bilirubin: 1 mg/dL (ref 0.3–1.2)
Total Protein: 6 g/dL — ABNORMAL LOW (ref 6.5–8.1)

## 2021-02-22 MED ORDER — ACETAMINOPHEN 500 MG PO TABS
1000.0000 mg | ORAL_TABLET | Freq: Once | ORAL | Status: AC
  Administered 2021-02-22: 1000 mg via ORAL

## 2021-02-22 MED ORDER — NYSTATIN 100000 UNIT/GM EX POWD: Freq: Two times a day (BID) | CUTANEOUS | 1 refills | Status: DC

## 2021-02-22 MED ORDER — DEXAMETHASONE SODIUM PHOSPHATE 10 MG/ML IJ SOLN
INTRAMUSCULAR | Status: AC
  Filled 2021-02-22: qty 1

## 2021-02-22 MED ORDER — HEPARIN SOD (PORK) LOCK FLUSH 100 UNIT/ML IV SOLN
500.0000 [IU] | Freq: Once | INTRAVENOUS | Status: AC | PRN
Start: 2021-02-22 — End: 2021-02-22
  Administered 2021-02-22: 500 [IU]
  Filled 2021-02-22: qty 5

## 2021-02-22 MED ORDER — SODIUM CHLORIDE 0.9 % IV SOLN: 6.0000 mg | Freq: Once | INTRAVENOUS | Status: DC

## 2021-02-22 MED ORDER — DEXTROSE 5 % IV SOLN
56.0000 mg/m2 | Freq: Once | INTRAVENOUS | Status: AC
  Administered 2021-02-22: 120 mg via INTRAVENOUS
  Filled 2021-02-22: qty 60

## 2021-02-22 MED ORDER — DEXAMETHASONE SODIUM PHOSPHATE 10 MG/ML IJ SOLN
6.0000 mg | Freq: Once | INTRAMUSCULAR | Status: AC
  Administered 2021-02-22: 6 mg via INTRAVENOUS

## 2021-02-22 MED ORDER — PROCHLORPERAZINE MALEATE 10 MG PO TABS
ORAL_TABLET | ORAL | Status: AC
  Filled 2021-02-22: qty 1

## 2021-02-22 MED ORDER — ACETAMINOPHEN 500 MG PO TABS
ORAL_TABLET | ORAL | Status: AC
  Filled 2021-02-22: qty 2

## 2021-02-22 MED ORDER — SODIUM CHLORIDE 0.9 % IV SOLN
Freq: Once | INTRAVENOUS | Status: AC
  Filled 2021-02-22: qty 250

## 2021-02-22 MED ORDER — PROCHLORPERAZINE MALEATE 10 MG PO TABS
10.0000 mg | ORAL_TABLET | Freq: Once | ORAL | Status: AC
  Administered 2021-02-22: 10 mg via ORAL

## 2021-02-22 MED ORDER — SODIUM CHLORIDE 0.9% FLUSH
10.0000 mL | INTRAVENOUS | Status: DC | PRN
  Administered 2021-02-22: 10 mL
  Filled 2021-02-22: qty 10

## 2021-02-22 MED ORDER — ZOLEDRONIC ACID 4 MG/100ML IV SOLN
INTRAVENOUS | Status: AC
  Filled 2021-02-22: qty 100

## 2021-02-22 MED ORDER — NYSTATIN-TRIAMCINOLONE 100000-0.1 UNIT/GM-% EX CREA: TOPICAL_CREAM | Freq: Two times a day (BID) | CUTANEOUS | 1 refills | Status: DC

## 2021-02-22 MED ORDER — SODIUM CHLORIDE 0.9 % IV SOLN
Freq: Once | INTRAVENOUS | Status: DC
  Filled 2021-02-22: qty 250

## 2021-02-23 ENCOUNTER — Telehealth: Payer: Self-pay | Admitting: Hematology

## 2021-02-23 LAB — KAPPA/LAMBDA LIGHT CHAINS
Kappa free light chain: 9.7 mg/L (ref 3.3–19.4)
Kappa, lambda light chain ratio: 1.28 (ref 0.26–1.65)
Lambda free light chains: 7.6 mg/L (ref 5.7–26.3)

## 2021-02-23 NOTE — Telephone Encounter (Signed)
Left message with follow-up appointments per 5/11 los. Gave option to call back to reschedule if needed.

## 2021-02-27 LAB — MULTIPLE MYELOMA PANEL, SERUM
Albumin SerPl Elph-Mcnc: 3.6 g/dL (ref 2.9–4.4)
Albumin/Glob SerPl: 1.9 — ABNORMAL HIGH (ref 0.7–1.7)
Alpha 1: 0.2 g/dL (ref 0.0–0.4)
Alpha2 Glob SerPl Elph-Mcnc: 0.6 g/dL (ref 0.4–1.0)
B-Globulin SerPl Elph-Mcnc: 0.9 g/dL (ref 0.7–1.3)
Gamma Glob SerPl Elph-Mcnc: 0.4 g/dL (ref 0.4–1.8)
Globulin, Total: 2 g/dL — ABNORMAL LOW (ref 2.2–3.9)
IgA: 15 mg/dL — ABNORMAL LOW (ref 87–352)
IgG (Immunoglobin G), Serum: 495 mg/dL — ABNORMAL LOW (ref 586–1602)
IgM (Immunoglobulin M), Srm: 20 mg/dL — ABNORMAL LOW (ref 26–217)
Total Protein ELP: 5.6 g/dL — ABNORMAL LOW (ref 6.0–8.5)

## 2021-03-08 ENCOUNTER — Other Ambulatory Visit: Payer: Self-pay

## 2021-03-08 ENCOUNTER — Inpatient Hospital Stay: Payer: Medicare Other

## 2021-03-08 ENCOUNTER — Other Ambulatory Visit: Payer: Medicare Other

## 2021-03-08 VITALS — BP 129/68 | HR 70 | Temp 97.8°F | Resp 18 | Ht 63.0 in | Wt 194.5 lb

## 2021-03-08 DIAGNOSIS — C9000 Multiple myeloma not having achieved remission: Secondary | ICD-10-CM

## 2021-03-08 DIAGNOSIS — Z95828 Presence of other vascular implants and grafts: Secondary | ICD-10-CM

## 2021-03-08 DIAGNOSIS — C9001 Multiple myeloma in remission: Secondary | ICD-10-CM

## 2021-03-08 DIAGNOSIS — Z7189 Other specified counseling: Secondary | ICD-10-CM

## 2021-03-08 DIAGNOSIS — Z23 Encounter for immunization: Secondary | ICD-10-CM

## 2021-03-08 DIAGNOSIS — Z5112 Encounter for antineoplastic immunotherapy: Secondary | ICD-10-CM | POA: Diagnosis not present

## 2021-03-08 LAB — CBC WITH DIFFERENTIAL/PLATELET
Abs Immature Granulocytes: 0.01 10*3/uL (ref 0.00–0.07)
Basophils Absolute: 0 10*3/uL (ref 0.0–0.1)
Basophils Relative: 1 %
Eosinophils Absolute: 0.1 10*3/uL (ref 0.0–0.5)
Eosinophils Relative: 5 %
HCT: 31.1 % — ABNORMAL LOW (ref 36.0–46.0)
Hemoglobin: 10.3 g/dL — ABNORMAL LOW (ref 12.0–15.0)
Immature Granulocytes: 0 %
Lymphocytes Relative: 41 %
Lymphs Abs: 1.2 10*3/uL (ref 0.7–4.0)
MCH: 32.6 pg (ref 26.0–34.0)
MCHC: 33.1 g/dL (ref 30.0–36.0)
MCV: 98.4 fL (ref 80.0–100.0)
Monocytes Absolute: 0.3 10*3/uL (ref 0.1–1.0)
Monocytes Relative: 12 %
Neutro Abs: 1.2 10*3/uL — ABNORMAL LOW (ref 1.7–7.7)
Neutrophils Relative %: 41 %
Platelets: 169 10*3/uL (ref 150–400)
RBC: 3.16 MIL/uL — ABNORMAL LOW (ref 3.87–5.11)
RDW: 12.7 % (ref 11.5–15.5)
WBC: 3 10*3/uL — ABNORMAL LOW (ref 4.0–10.5)
nRBC: 0 % (ref 0.0–0.2)

## 2021-03-08 LAB — CMP (CANCER CENTER ONLY)
ALT: 18 U/L (ref 0–44)
AST: 21 U/L (ref 15–41)
Albumin: 3.6 g/dL (ref 3.5–5.0)
Alkaline Phosphatase: 59 U/L (ref 38–126)
Anion gap: 9 (ref 5–15)
BUN: 10 mg/dL (ref 8–23)
CO2: 23 mmol/L (ref 22–32)
Calcium: 9.2 mg/dL (ref 8.9–10.3)
Chloride: 110 mmol/L (ref 98–111)
Creatinine: 0.94 mg/dL (ref 0.44–1.00)
GFR, Estimated: >60 mL/min (ref >60)
Glucose, Bld: 94 mg/dL (ref 70–99)
Potassium: 3.8 mmol/L (ref 3.5–5.1)
Sodium: 142 mmol/L (ref 135–145)
Total Bilirubin: 0.8 mg/dL (ref 0.3–1.2)
Total Protein: 6.1 g/dL — ABNORMAL LOW (ref 6.5–8.1)

## 2021-03-08 MED ORDER — DEXTROSE 5 % IV SOLN
56.0000 mg/m2 | Freq: Once | INTRAVENOUS | Status: AC
  Administered 2021-03-08: 120 mg via INTRAVENOUS
  Filled 2021-03-08: qty 60

## 2021-03-08 MED ORDER — SODIUM CHLORIDE 0.9 % IV SOLN
Freq: Once | INTRAVENOUS | Status: AC
  Filled 2021-03-08: qty 250

## 2021-03-08 MED ORDER — HEPARIN SOD (PORK) LOCK FLUSH 100 UNIT/ML IV SOLN
500.0000 [IU] | Freq: Once | INTRAVENOUS | Status: AC | PRN
  Administered 2021-03-08: 500 [IU]
  Filled 2021-03-08: qty 5

## 2021-03-08 MED ORDER — PROCHLORPERAZINE MALEATE 10 MG PO TABS
ORAL_TABLET | ORAL | Status: AC
  Filled 2021-03-08: qty 1

## 2021-03-08 MED ORDER — SODIUM CHLORIDE 0.9% FLUSH
10.0000 mL | Freq: Once | INTRAVENOUS | Status: AC
  Administered 2021-03-08: 10 mL
  Filled 2021-03-08: qty 10

## 2021-03-08 MED ORDER — ACETAMINOPHEN 500 MG PO TABS
ORAL_TABLET | ORAL | Status: AC
  Filled 2021-03-08: qty 2

## 2021-03-08 MED ORDER — SODIUM CHLORIDE 0.9% FLUSH
10.0000 mL | INTRAVENOUS | Status: DC | PRN
  Administered 2021-03-08: 10 mL
  Filled 2021-03-08: qty 10

## 2021-03-08 MED ORDER — DEXAMETHASONE SODIUM PHOSPHATE 10 MG/ML IJ SOLN
6.0000 mg | Freq: Once | INTRAMUSCULAR | Status: AC
Start: 2021-03-08 — End: 2021-03-08
  Administered 2021-03-08: 6 mg via INTRAVENOUS

## 2021-03-08 MED ORDER — DEXAMETHASONE SODIUM PHOSPHATE 10 MG/ML IJ SOLN
INTRAMUSCULAR | Status: AC
  Filled 2021-03-08: qty 1

## 2021-03-08 MED ORDER — ACETAMINOPHEN 500 MG PO TABS
1000.0000 mg | ORAL_TABLET | Freq: Once | ORAL | Status: AC
  Administered 2021-03-08: 1000 mg via ORAL

## 2021-03-08 MED ORDER — PROCHLORPERAZINE MALEATE 10 MG PO TABS
10.0000 mg | ORAL_TABLET | Freq: Once | ORAL | Status: AC
Start: 2021-03-08 — End: 2021-03-08
  Administered 2021-03-08: 10 mg via ORAL

## 2021-03-08 NOTE — Progress Notes (Signed)
OK to Treat ANC 1.2 per Dr. Irene Limbo

## 2021-03-08 NOTE — Patient Instructions (Signed)
[facility] Discharge Instructions for Patients Receiving Chemotherapy  Today you received the following chemotherapy agents carflizomib  To help prevent nausea and vomiting after your treatment, we encourage you to take your nausea medication as directed.   If you develop nausea and vomiting that is not controlled by your nausea medication, call the clinic.   BELOW ARE SYMPTOMS THAT SHOULD BE REPORTED IMMEDIATELY:  *FEVER GREATER THAN 100.5 F  *CHILLS WITH OR WITHOUT FEVER  NAUSEA AND VOMITING THAT IS NOT CONTROLLED WITH YOUR NAUSEA MEDICATION  *UNUSUAL SHORTNESS OF BREATH  *UNUSUAL BRUISING OR BLEEDING  TENDERNESS IN MOUTH AND THROAT WITH OR WITHOUT PRESENCE OF ULCERS  *URINARY PROBLEMS  *BOWEL PROBLEMS  UNUSUAL RASH Items with * indicate a potential emergency and should be followed up as soon as possible.  Feel free to call the clinic should you have any questions or concerns. The clinic phone number is [phone].  Please show the [place] at check-in to the Emergency Department and triage nurse.

## 2021-03-16 DIAGNOSIS — B372 Candidiasis of skin and nail: Secondary | ICD-10-CM | POA: Diagnosis not present

## 2021-03-16 DIAGNOSIS — Z9484 Stem cells transplant status: Secondary | ICD-10-CM | POA: Diagnosis not present

## 2021-03-16 DIAGNOSIS — Z87891 Personal history of nicotine dependence: Secondary | ICD-10-CM | POA: Diagnosis not present

## 2021-03-16 DIAGNOSIS — Z7983 Long term (current) use of bisphosphonates: Secondary | ICD-10-CM | POA: Diagnosis not present

## 2021-03-16 DIAGNOSIS — C9000 Multiple myeloma not having achieved remission: Secondary | ICD-10-CM | POA: Diagnosis not present

## 2021-03-16 DIAGNOSIS — M549 Dorsalgia, unspecified: Secondary | ICD-10-CM | POA: Diagnosis not present

## 2021-03-16 DIAGNOSIS — C9001 Multiple myeloma in remission: Secondary | ICD-10-CM | POA: Diagnosis not present

## 2021-03-16 DIAGNOSIS — B3789 Other sites of candidiasis: Secondary | ICD-10-CM | POA: Diagnosis not present

## 2021-03-18 ENCOUNTER — Encounter: Payer: Self-pay | Admitting: Hematology

## 2021-03-22 ENCOUNTER — Other Ambulatory Visit: Payer: Self-pay

## 2021-03-22 ENCOUNTER — Inpatient Hospital Stay: Payer: Medicare Other

## 2021-03-22 ENCOUNTER — Encounter: Payer: Self-pay | Admitting: Nurse Practitioner

## 2021-03-22 ENCOUNTER — Telehealth: Payer: Self-pay | Admitting: Physician Assistant

## 2021-03-22 ENCOUNTER — Other Ambulatory Visit: Payer: Medicare Other

## 2021-03-22 ENCOUNTER — Ambulatory Visit: Payer: Medicare Other | Admitting: Physician Assistant

## 2021-03-22 ENCOUNTER — Inpatient Hospital Stay: Payer: Medicare Other | Attending: Hematology | Admitting: Nurse Practitioner

## 2021-03-22 VITALS — BP 147/81 | HR 77 | Temp 98.0°F | Resp 17 | Wt 192.0 lb

## 2021-03-22 DIAGNOSIS — C9001 Multiple myeloma in remission: Secondary | ICD-10-CM | POA: Diagnosis not present

## 2021-03-22 DIAGNOSIS — Z95828 Presence of other vascular implants and grafts: Secondary | ICD-10-CM

## 2021-03-22 DIAGNOSIS — Z5112 Encounter for antineoplastic immunotherapy: Secondary | ICD-10-CM | POA: Diagnosis not present

## 2021-03-22 DIAGNOSIS — D709 Neutropenia, unspecified: Secondary | ICD-10-CM | POA: Insufficient documentation

## 2021-03-22 DIAGNOSIS — Z86718 Personal history of other venous thrombosis and embolism: Secondary | ICD-10-CM | POA: Diagnosis not present

## 2021-03-22 DIAGNOSIS — C9000 Multiple myeloma not having achieved remission: Secondary | ICD-10-CM | POA: Insufficient documentation

## 2021-03-22 DIAGNOSIS — Z452 Encounter for adjustment and management of vascular access device: Secondary | ICD-10-CM | POA: Diagnosis not present

## 2021-03-22 DIAGNOSIS — Z23 Encounter for immunization: Secondary | ICD-10-CM

## 2021-03-22 LAB — CBC WITH DIFFERENTIAL/PLATELET
Abs Immature Granulocytes: 0.01 10*3/uL (ref 0.00–0.07)
Basophils Absolute: 0 10*3/uL (ref 0.0–0.1)
Basophils Relative: 1 %
Eosinophils Absolute: 0.1 10*3/uL (ref 0.0–0.5)
Eosinophils Relative: 2 %
HCT: 31.7 % — ABNORMAL LOW (ref 36.0–46.0)
Hemoglobin: 10.8 g/dL — ABNORMAL LOW (ref 12.0–15.0)
Immature Granulocytes: 0 %
Lymphocytes Relative: 57 %
Lymphs Abs: 1.5 10*3/uL (ref 0.7–4.0)
MCH: 32.3 pg (ref 26.0–34.0)
MCHC: 34.1 g/dL (ref 30.0–36.0)
MCV: 94.9 fL (ref 80.0–100.0)
Monocytes Absolute: 0.4 10*3/uL (ref 0.1–1.0)
Monocytes Relative: 14 %
Neutro Abs: 0.7 10*3/uL — ABNORMAL LOW (ref 1.7–7.7)
Neutrophils Relative %: 26 %
Platelets: 192 10*3/uL (ref 150–400)
RBC: 3.34 MIL/uL — ABNORMAL LOW (ref 3.87–5.11)
RDW: 12.7 % (ref 11.5–15.5)
WBC: 2.7 10*3/uL — ABNORMAL LOW (ref 4.0–10.5)
nRBC: 0 % (ref 0.0–0.2)

## 2021-03-22 LAB — CMP (CANCER CENTER ONLY)
ALT: 22 U/L (ref 0–44)
AST: 21 U/L (ref 15–41)
Albumin: 3.8 g/dL (ref 3.5–5.0)
Alkaline Phosphatase: 62 U/L (ref 38–126)
Anion gap: 10 (ref 5–15)
BUN: 17 mg/dL (ref 8–23)
CO2: 23 mmol/L (ref 22–32)
Calcium: 9.1 mg/dL (ref 8.9–10.3)
Chloride: 109 mmol/L (ref 98–111)
Creatinine: 0.86 mg/dL (ref 0.44–1.00)
GFR, Estimated: >60 mL/min (ref >60)
Glucose, Bld: 88 mg/dL (ref 70–99)
Potassium: 4.1 mmol/L (ref 3.5–5.1)
Sodium: 142 mmol/L (ref 135–145)
Total Bilirubin: 0.8 mg/dL (ref 0.3–1.2)
Total Protein: 6.5 g/dL (ref 6.5–8.1)

## 2021-03-22 MED ORDER — SODIUM CHLORIDE 0.9% FLUSH
10.0000 mL | Freq: Once | INTRAVENOUS | Status: AC
  Administered 2021-03-22: 10 mL
  Filled 2021-03-22: qty 10

## 2021-03-22 MED ORDER — HEPARIN SOD (PORK) LOCK FLUSH 100 UNIT/ML IV SOLN
500.0000 [IU] | Freq: Once | INTRAVENOUS | Status: AC
  Administered 2021-03-22: 500 [IU]
  Filled 2021-03-22: qty 5

## 2021-03-22 NOTE — Telephone Encounter (Signed)
Rescheduled appointment per provider. Patient is aware. 

## 2021-03-22 NOTE — Progress Notes (Signed)
[place]   [phone] [phone]   Clinic Follow up Note   Patient Care Team: Celene Squibb, MD as PCP - General (Internal Medicine) Brunetta Genera, MD as Consulting Physician (Hematology) Dorna Leitz, MD as Consulting Physician (Orthopedic Surgery) 03/22/2021  CHIEF COMPLAINT: Follow up multiple myeloma   CURRENT THERAPY: maintenance carfilzomib days 1, 15 q. 28 days  INTERVAL HISTORY: Ms. Madison Parker returns for follow-up and treatment as scheduled.  She continues maintenance Kyprolis, last dose 03/08/2021.  She was seen by Dr. Quentin Ore on 03/16/2021 who felt she was doing well and recommended to continue maintenance kyprolis, except to hold steroids to see if fungal rash is related.  She completed fluconazole, rash has nearly resolved.  She is otherwise doing well, no specific complaints.  She took 4 tabs amoxicillin for routine dental cleaning and checkup this week, no extractions or procedures.  She has mild right hip pain after increasing her exercise/walking near her home with [place].  Energy and appetite have improved.  No nausea/vomiting, constipation or diarrhea.  Neuropathy has improved but has bad leg cramps, no swelling.  Denies fever, chills, cough, chest pain, dyspnea.   MEDICAL HISTORY:  Past Medical History:  Diagnosis Date  . Headache   . Low back pain   . SBO (small bowel obstruction) ([place]) 2010  . Smoldering multiple myeloma ([place])     SURGICAL HISTORY: Past Surgical History:  Procedure Laterality Date  . ABDOMINAL HYSTERECTOMY     complete  . BACK SURGERY     lower at [facility]  . COLONOSCOPY WITH PROPOFOL N/A 08/23/2020   Procedure: COLONOSCOPY WITH PROPOFOL;  Surgeon: Harvel Quale, MD;  [facility];  Service: Gastroenterology;  Laterality: N/A;  900  . colonscopy  2014  . GASTRIC BYPASS  yrs ago  . HERNIA REPAIR    . IR IMAGING GUIDED PORT INSERTION  04/20/2019  . KNEE SURGERY  06/04/2009   both knees  replaced   . POLYPECTOMY  08/23/2020   Procedure: POLYPECTOMY;  Surgeon: Harvel Quale, MD;  [facility];  Service: Gastroenterology;;  . TONSILLECTOMY  age 53  . TOTAL HIP ARTHROPLASTY Left 08/16/2017   Procedure: LEFT TOTAL HIP ARTHROPLASTY ANTERIOR APPROACH;  Surgeon: Dorna Leitz, MD;  Location: [facility];  Service: Orthopedics;  Laterality: Left;    I have reviewed the social history and family history with the patient and they are unchanged from previous note.  ALLERGIES:  is allergic to codeine.  MEDICATIONS:  Current Outpatient Medications  Medication Sig Dispense Refill  . acyclovir (ZOVIRAX) 800 MG tablet     . Calcium Carbonate-Vitamin D 600-200 MG-UNIT TABS Calcium 600 with Vitamin D3 600 mg-5 mcg (200 unit) tablet  Take 1 tablet every day by oral route as directed.    . fluconazole (DIFLUCAN) 100 MG tablet     . FOLIC ACID PO Take 8,127 mg by mouth daily.     Marland Kitchen lidocaine-prilocaine (EMLA) cream Apply 1 application topically as needed. 30 g 2  . NYSTATIN powder Apply topically 2 (two) times daily. 15 g 1  . nystatin-triamcinolone (MYCOLOG II) cream Apply topically 2 (two) times daily. 30 g 1  . Probiotic Product (PROBIOTIC DAILY PO) Take 2 capsules by mouth daily. Gummies    . vitamin B-12 (CYANOCOBALAMIN) 1000 MCG tablet Take 1,000 mcg by mouth daily.     . Vitamin D, Ergocalciferol, (DRISDOL) 1.25 MG (50000 UNIT) CAPS capsule TAKE 1 CAPSULE ONCE A WEEK (  Patient taking differently: Take 50,000 Units by mouth every 7 (seven) days. Sunday) 13 capsule 3   No current facility-administered medications for this visit.   Facility-Administered Medications Ordered in Other Visits  Medication Dose Route Frequency Provider Last Rate Last Admin  . sodium chloride flush (NS) 0.9 % injection 10 mL  10 mL Intracatheter PRN Brunetta Genera, MD   10 mL at 05/11/19 1437    PHYSICAL EXAMINATION: ECOG PERFORMANCE STATUS: 1 - Symptomatic but completely  ambulatory  Vitals:   03/22/21 1224  BP: (!) 147/81  Pulse: 77  Resp: 17  Temp: 98 F (36.7 C)  SpO2: 100%   Filed Weights   03/22/21 1224  Weight: 192 lb (87.1 kg)    GENERAL:alert, no distress and comfortable SKIN: No rash to exposed skin EYES: sclera clear LUNGS:  normal breathing effort HEART:  no lower extremity edema NEURO: alert & oriented x 3 with fluent speech, no focal motor deficits PAC without erythema  LABORATORY DATA:  I have reviewed the data as listed CBC Latest Ref Rng & Units 03/22/2021 03/08/2021 02/22/2021  WBC 4.0 - 10.5 K/uL 2.7(L) 3.0(L) 4.1  Hemoglobin 12.0 - 15.0 g/dL 10.8(L) 10.3(L) 10.8(L)  Hematocrit 36.0 - 46.0 % 31.7(L) 31.1(L) 32.9(L)  Platelets 150 - 400 K/uL 192 169 201     CMP Latest Ref Rng & Units 03/22/2021 03/08/2021 02/22/2021  Glucose 70 - 99 mg/dL 88 94 83  BUN 8 - 23 mg/dL _0 Creatinine 0.44 - 1.00 mg/dL 0.86 0.94 0.78  Sodium 135 - 145 mmol/L 142 142 142  Potassium 3.5 - 5.1 mmol/L 4.1 3.8 4.0  Chloride 98 - 111 mmol/L 109 110 110  CO2 22 - 32 mmol/L _1 Calcium 8.9 - 10.3 mg/dL 9.1 9.2 9.1  Total Protein 6.5 - 8.1 g/dL 6.5 6.1(L) 6.0(L)  Total Bilirubin 0.3 - 1.2 mg/dL 0.8 0.8 1.0  Alkaline Phos 38 - 126 U/L 62 59 60  AST 15 - 41 U/L _2 ALT 0 - 44 U/L _3 RADIOGRAPHIC STUDIES: I have personally reviewed the radiological images as listed and agreed with the findings in the report. No results found.   ASSESSMENT & PLAN: 70 year old female  1.  Multiple myeloma -Initially diagnosed as smoldering myeloma in 2011.  Initial BMBX 17% kappa restricted plasma cells consistent with plasma cell neoplasm -BM BX 03/11/2019 revealed involvement by 50% plasma cells, genetics high risk T (14; 16) -Staging PET 04/01/2019 showed no evidence of active myeloma  -S/p induction therapy with carfilzomib, lenalidomide, and Dex on 04/27/2019, achieved normalization of light chains by start of cycle 4 on  07/28/19 -Developed DVT 06/24/2019 started on apixaban -S/p autologous stem cell transplant with melphalan prep [facility]15/[facility] by Dr. Aris Lot -Maintenance carfilzomib days 1, 15 q. 28 days with Dex  Disposition: Ms. Ahlgrim appears stable.  She continues maintenance carfilzomib w dex on days 1 and 15 q. 28 days, and q. 3 month Zometa (last dosed 01/25/21).  She tolerates treatment well without significant toxicities.  She is able to recover and function well and remain active.   She completed fluconazole, rash nearly resolved.  Dr. Aris Lot recommends to hold Dex to see if rash is steroid-related.  Labs reviewed, CBC and CMP stable except worsening neutropenia ANC 0.7. Question relation to recent antibiotics. No signs of infection, we reviewed precautions. Last MM Panel stable, M protein not detected and normal K/L  light chain ratio.   I discussed her case with Dr. Lorenso Courier in Dr. Grier Mitts absence who recommends to hold today's treatment for neutropenia, given that this is a maintenance regimen. She will return for next scheduled treatment with f/up in 2 weeks.   All questions were answered. The patient knows to call the clinic with any problems, questions or concerns. No barriers to learning were detected.     Alla Feeling, NP 03/22/21

## 2021-03-23 ENCOUNTER — Telehealth: Payer: Self-pay | Admitting: Hematology

## 2021-03-23 NOTE — Telephone Encounter (Signed)
Scheduled follow-up appointment per 6/8 los. Patient is aware.

## 2021-03-29 ENCOUNTER — Encounter: Payer: Self-pay | Admitting: Hematology

## 2021-04-04 ENCOUNTER — Other Ambulatory Visit: Payer: Self-pay | Admitting: Physician Assistant

## 2021-04-04 DIAGNOSIS — C9000 Multiple myeloma not having achieved remission: Secondary | ICD-10-CM

## 2021-04-05 ENCOUNTER — Other Ambulatory Visit: Payer: Self-pay

## 2021-04-05 ENCOUNTER — Inpatient Hospital Stay: Payer: Medicare Other

## 2021-04-05 ENCOUNTER — Encounter: Payer: Self-pay | Admitting: Hematology

## 2021-04-05 ENCOUNTER — Other Ambulatory Visit: Payer: Self-pay | Admitting: Hematology and Oncology

## 2021-04-05 ENCOUNTER — Inpatient Hospital Stay (HOSPITAL_BASED_OUTPATIENT_CLINIC_OR_DEPARTMENT_OTHER): Payer: Medicare Other | Admitting: Physician Assistant

## 2021-04-05 VITALS — BP 138/68 | HR 77 | Temp 97.5°F | Resp 18 | Wt 196.7 lb

## 2021-04-05 DIAGNOSIS — Z452 Encounter for adjustment and management of vascular access device: Secondary | ICD-10-CM | POA: Diagnosis not present

## 2021-04-05 DIAGNOSIS — D709 Neutropenia, unspecified: Secondary | ICD-10-CM | POA: Diagnosis not present

## 2021-04-05 DIAGNOSIS — C9000 Multiple myeloma not having achieved remission: Secondary | ICD-10-CM

## 2021-04-05 DIAGNOSIS — Z7189 Other specified counseling: Secondary | ICD-10-CM

## 2021-04-05 DIAGNOSIS — Z5112 Encounter for antineoplastic immunotherapy: Secondary | ICD-10-CM | POA: Diagnosis not present

## 2021-04-05 DIAGNOSIS — Z95828 Presence of other vascular implants and grafts: Secondary | ICD-10-CM

## 2021-04-05 DIAGNOSIS — Z86718 Personal history of other venous thrombosis and embolism: Secondary | ICD-10-CM | POA: Diagnosis not present

## 2021-04-05 LAB — CBC WITH DIFFERENTIAL (CANCER CENTER ONLY)
Abs Immature Granulocytes: 0.01 10*3/uL (ref 0.00–0.07)
Basophils Absolute: 0 10*3/uL (ref 0.0–0.1)
Basophils Relative: 1 %
Eosinophils Absolute: 0.1 10*3/uL (ref 0.0–0.5)
Eosinophils Relative: 1 %
HCT: 30.8 % — ABNORMAL LOW (ref 36.0–46.0)
Hemoglobin: 10.2 g/dL — ABNORMAL LOW (ref 12.0–15.0)
Immature Granulocytes: 0 %
Lymphocytes Relative: 43 %
Lymphs Abs: 1.8 10*3/uL (ref 0.7–4.0)
MCH: 31.4 pg (ref 26.0–34.0)
MCHC: 33.1 g/dL (ref 30.0–36.0)
MCV: 94.8 fL (ref 80.0–100.0)
Monocytes Absolute: 0.4 10*3/uL (ref 0.1–1.0)
Monocytes Relative: 11 %
Neutro Abs: 1.8 10*3/uL (ref 1.7–7.7)
Neutrophils Relative %: 44 %
Platelet Count: 161 10*3/uL (ref 150–400)
RBC: 3.25 MIL/uL — ABNORMAL LOW (ref 3.87–5.11)
RDW: 12.8 % (ref 11.5–15.5)
WBC Count: 4.1 10*3/uL (ref 4.0–10.5)
nRBC: 0 % (ref 0.0–0.2)

## 2021-04-05 LAB — CMP (CANCER CENTER ONLY)
ALT: 18 U/L (ref 0–44)
AST: 19 U/L (ref 15–41)
Albumin: 3.6 g/dL (ref 3.5–5.0)
Alkaline Phosphatase: 61 U/L (ref 38–126)
Anion gap: 7 (ref 5–15)
BUN: 16 mg/dL (ref 8–23)
CO2: 22 mmol/L (ref 22–32)
Calcium: 8.9 mg/dL (ref 8.9–10.3)
Chloride: 113 mmol/L — ABNORMAL HIGH (ref 98–111)
Creatinine: 0.9 mg/dL (ref 0.44–1.00)
GFR, Estimated: >60 mL/min (ref >60)
Glucose, Bld: 90 mg/dL (ref 70–99)
Potassium: 3.9 mmol/L (ref 3.5–5.1)
Sodium: 142 mmol/L (ref 135–145)
Total Bilirubin: 0.8 mg/dL (ref 0.3–1.2)
Total Protein: 6.1 g/dL — ABNORMAL LOW (ref 6.5–8.1)

## 2021-04-05 MED ORDER — SODIUM CHLORIDE 0.9 % IV SOLN
Freq: Once | INTRAVENOUS | Status: AC
  Filled 2021-04-05: qty 250

## 2021-04-05 MED ORDER — PROCHLORPERAZINE MALEATE 10 MG PO TABS
10.0000 mg | ORAL_TABLET | Freq: Once | ORAL | Status: AC
  Administered 2021-04-05: 10 mg via ORAL

## 2021-04-05 MED ORDER — DEXAMETHASONE SODIUM PHOSPHATE 10 MG/ML IJ SOLN
6.0000 mg | Freq: Once | INTRAMUSCULAR | Status: AC
  Administered 2021-04-05: 6 mg via INTRAVENOUS

## 2021-04-05 MED ORDER — DEXAMETHASONE SODIUM PHOSPHATE 10 MG/ML IJ SOLN
INTRAMUSCULAR | Status: AC
  Filled 2021-04-05: qty 1

## 2021-04-05 MED ORDER — DEXTROSE 5 % IV SOLN
56.0000 mg/m2 | Freq: Once | INTRAVENOUS | Status: AC
  Administered 2021-04-05: 120 mg via INTRAVENOUS
  Filled 2021-04-05: qty 60

## 2021-04-05 MED ORDER — PROCHLORPERAZINE MALEATE 10 MG PO TABS
ORAL_TABLET | ORAL | Status: AC
  Filled 2021-04-05: qty 1

## 2021-04-05 MED ORDER — HEPARIN SOD (PORK) LOCK FLUSH 100 UNIT/ML IV SOLN
500.0000 [IU] | Freq: Once | INTRAVENOUS | Status: AC | PRN
  Administered 2021-04-05: 500 [IU]
  Filled 2021-04-05: qty 5

## 2021-04-05 MED ORDER — ACETAMINOPHEN 500 MG PO TABS
1000.0000 mg | ORAL_TABLET | Freq: Once | ORAL | Status: AC
  Administered 2021-04-05: 1000 mg via ORAL

## 2021-04-05 MED ORDER — SODIUM CHLORIDE 0.9% FLUSH
10.0000 mL | INTRAVENOUS | Status: DC | PRN
  Administered 2021-04-05: 10 mL
  Filled 2021-04-05: qty 10

## 2021-04-05 MED ORDER — SODIUM CHLORIDE 0.9% FLUSH
10.0000 mL | Freq: Once | INTRAVENOUS | Status: AC
  Administered 2021-04-05: 10 mL
  Filled 2021-04-05: qty 10

## 2021-04-05 MED ORDER — ACETAMINOPHEN 500 MG PO TABS
ORAL_TABLET | ORAL | Status: AC
  Filled 2021-04-05: qty 2

## 2021-04-05 NOTE — Patient Instructions (Signed)
[facility] Discharge Instructions for Patients Receiving Chemotherapy  Today you received the following chemotherapy agents carflizomib  To help prevent nausea and vomiting after your treatment, we encourage you to take your nausea medication as directed.   If you develop nausea and vomiting that is not controlled by your nausea medication, call the clinic.   BELOW ARE SYMPTOMS THAT SHOULD BE REPORTED IMMEDIATELY: *FEVER GREATER THAN 100.5 F *CHILLS WITH OR WITHOUT FEVER NAUSEA AND VOMITING THAT IS NOT CONTROLLED WITH YOUR NAUSEA MEDICATION *UNUSUAL SHORTNESS OF BREATH *UNUSUAL BRUISING OR BLEEDING TENDERNESS IN MOUTH AND THROAT WITH OR WITHOUT PRESENCE OF ULCERS *URINARY PROBLEMS *BOWEL PROBLEMS UNUSUAL RASH Items with * indicate a potential emergency and should be followed up as soon as possible.  Feel free to call the clinic should you have any questions or concerns. The clinic phone number is [phone].  Please show the [place] at check-in to the Emergency Department and triage nurse.

## 2021-04-06 ENCOUNTER — Encounter: Payer: Self-pay | Admitting: Hematology

## 2021-04-06 NOTE — Progress Notes (Signed)
[place] [facility]   Telephone:([phone]) [phone] Fax:(336) [phone]   Clinic Follow up Note   Patient Care Team: Celene Squibb, MD as PCP - General (Internal Medicine) Brunetta Genera, MD as Consulting Physician (Hematology) Dorna Leitz, MD as Consulting Physician (Orthopedic Surgery) 04/06/2021  CHIEF COMPLAINT: Follow up multiple myeloma   CURRENT THERAPY: maintenance carfilzomib days 1, 15 q. 28 days  INTERVAL HISTORY: Madison Parker returns for follow-up and treatment as scheduled. She was last seen on 03/22/2021 but treatment was help due to neutropenia. Patient is unaccompanied for this visit.   On exam today, Ms. Madison Parker reports that she is doing well. Her energy levels have improved since the last visit. She has a good appetite without any noticeable weight changes. She reports that her fungal rash that was involving her breast, axillae and vulvovaginal area has nearly resolved. She has held dexamethasone per recommendation from Dr .Quentin Ore and completed course of fluconazole. She denies any nausea, vomiting or abdominal pain. Her bowel movements are regular without any constipation or diarrhea. Patient has chronic but mild right hip pain that worsens with exercising and walking. She does not require any intervention at this time including pain medication. Patient denies any fevers, chills, night sweats, shortness of breath, chest pain or cough. She has no other complaints.    MEDICAL HISTORY:  Past Medical History:  Diagnosis Date   Headache    Low back pain    SBO (small bowel obstruction) ([place]) 2010   Smoldering multiple myeloma ([place])     SURGICAL HISTORY: Past Surgical History:  Procedure Laterality Date   ABDOMINAL HYSTERECTOMY     complete   BACK SURGERY     lower at [facility]   COLONOSCOPY WITH PROPOFOL N/A 08/23/2020   Procedure: COLONOSCOPY WITH PROPOFOL;  Surgeon: Harvel Quale, MD;  [facility];  Service: Gastroenterology;  Laterality: N/A;   900   colonscopy  2014   GASTRIC BYPASS  yrs ago   HERNIA REPAIR     IR IMAGING GUIDED PORT INSERTION  04/20/2019   KNEE SURGERY  06/04/2009   both knees replaced    POLYPECTOMY  08/23/2020   Procedure: POLYPECTOMY;  Surgeon: Harvel Quale, MD;  [facility];  Service: Gastroenterology;;   TONSILLECTOMY  age 24   TOTAL HIP ARTHROPLASTY Left 08/16/2017   Procedure: LEFT TOTAL HIP ARTHROPLASTY ANTERIOR APPROACH;  Surgeon: Dorna Leitz, MD;  Location[facility];  Service: Orthopedics;  Laterality: Left;    I have reviewed the social history and family history with the patient and they are unchanged from previous note.  ALLERGIES:  is allergic to codeine.  MEDICATIONS:  Current Outpatient Medications  Medication Sig Dispense Refill   acyclovir (ZOVIRAX) 800 MG tablet      Calcium Carbonate-Vitamin D 600-200 MG-UNIT TABS Calcium 600 with Vitamin D3 600 mg-5 mcg (200 unit) tablet  Take 1 tablet every day by oral route as directed.     fluconazole (DIFLUCAN) 100 MG tablet      FOLIC ACID PO Take 0,488 mg by mouth daily.      lidocaine-prilocaine (EMLA) cream Apply 1 application topically as needed. 30 g 2   NYSTATIN powder Apply topically 2 (two) times daily. 15 g 1   nystatin-triamcinolone (MYCOLOG II) cream Apply topically 2 (two) times daily. 30 g 1   Probiotic Product (PROBIOTIC DAILY PO) Take 2 capsules by mouth daily. Gummies     vitamin B-12 (CYANOCOBALAMIN) 1000 MCG tablet Take 1,000  mcg by mouth daily.      Vitamin D, Ergocalciferol, (DRISDOL) 1.25 MG (50000 UNIT) CAPS capsule TAKE 1 CAPSULE ONCE A WEEK (Patient taking differently: Take 50,000 Units by mouth every 7 (seven) days. Sunday) 13 capsule 3   No current facility-administered medications for this visit.   Facility-Administered Medications Ordered in Other Visits  Medication Dose Route Frequency Provider Last Rate Last Admin   sodium chloride flush (NS) 0.9 % injection 10 mL  10 mL Intracatheter PRN  Brunetta Genera, MD   10 mL at 05/11/19 1437   REVIEW OF SYSTEMS: Review of Systems  Constitutional:  Negative for chills, fever and weight loss.  Respiratory:  Negative for cough and shortness of breath.   Cardiovascular:  Negative for chest pain.  Gastrointestinal:  Negative for abdominal pain, blood in stool, constipation, diarrhea, melena, nausea and vomiting.  Skin:  Positive for rash (Nearly resolved fungal rash, now mainly involving the vulvovaginal area).  Neurological:  Negative for dizziness and headaches.  Endo/Heme/Allergies:  Does not bruise/bleed easily.    PHYSICAL EXAMINATION: ECOG PERFORMANCE STATUS: 1 - Symptomatic but completely ambulatory  Vitals:   04/05/21 1330  BP: 138/68  Pulse: 77  Resp: 18  Temp: (!) 97.5 F (36.4 C)  SpO2: 100%   Filed Weights   04/05/21 1330  Weight: 196 lb 11.2 oz (89.2 kg)    GENERAL:alert, no distress and comfortable SKIN: No rash to exposed skin EYES: sclera clear LUNGS:  normal breathing effort HEART:  no lower extremity edema NEURO: alert & oriented x 3 with fluent speech, no focal motor deficits PAC without erythema  LABORATORY DATA:  I have reviewed the data as listed CBC Latest Ref Rng & Units 04/05/2021 03/22/2021 03/08/2021  WBC 4.0 - 10.5 K/uL 4.1 2.7(L) 3.0(L)  Hemoglobin 12.0 - 15.0 g/dL 10.2(L) 10.8(L) 10.3(L)  Hematocrit 36.0 - 46.0 % 30.8(L) 31.7(L) 31.1(L)  Platelets 150 - 400 K/uL 161 192 169     CMP Latest Ref Rng & Units 04/05/2021 03/22/2021 03/08/2021  Glucose 70 - 99 mg/dL 90 88 94  BUN 8 - 23 mg/dL _0 Creatinine 0.44 - 1.00 mg/dL 0.90 0.86 0.94  Sodium 135 - 145 mmol/L 142 142 142  Potassium 3.5 - 5.1 mmol/L 3.9 4.1 3.8  Chloride 98 - 111 mmol/L 113(H) 109 110  CO2 22 - 32 mmol/L _1 Calcium 8.9 - 10.3 mg/dL 8.9 9.1 9.2  Total Protein 6.5 - 8.1 g/dL 6.1(L) 6.5 6.1(L)  Total Bilirubin 0.3 - 1.2 mg/dL 0.8 0.8 0.8  Alkaline Phos 38 - 126 U/L 61 62 59  AST 15 - 41 U/L _2 ALT  0 - 44 U/L _3 RADIOGRAPHIC STUDIES: No results found.   ASSESSMENT & PLAN: 70 year old female  1.  Multiple myeloma: -Initially diagnosed as smoldering myeloma in 2011.  Initial BMBX 17% kappa restricted plasma cells consistent with plasma cell neoplasm -BM BX 03/11/2019 revealed involvement by 50% plasma cells, genetics high risk T (14; 16) -Staging PET 04/01/2019 showed no evidence of active myeloma  -S/p induction therapy with carfilzomib, lenalidomide, and Dex on 04/27/2019, achieved normalization of light chains by start of cycle 4 on 07/28/19 -Developed DVT 06/24/2019 started on apixaban -S/p autologous stem cell transplant with melphalan prep 09/15/2019 at [facility] by Dr. Aris Lot -Current regimen includes maintenance carfilzomib days 1, 15 q. 28 days with Dex. -Receives Zometa every 3 months, last dose on 01/25/2021. -Last  MM Panel from 02/22/2021 was stable, M protein not detected and normal K/L light chain ratio.  -Ms. Olund returns today, 04/05/2021 for a follow up visit prior to Carfilzomib infusion. Labs from today were reviewed that shows neutropenia has resolved with ANC of 1.8. Hemoglobin is stable at 10.2. Recommend to proceed with treatment. Recommend to continue to hold dex as rash has not completely resolved. Patient will return to clinic on 05/03/2021 to see Dr. Ishika [place] Limbo, labs and another Carfilzomib infusion.   All questions were answered. The patient knows to call the clinic with any problems, questions or concerns. No barriers to learning were detected.  I have spent a total of 30 minutes minutes of face-to-face and non-face-to-face time, preparing to see the patient, obtaining and/or reviewing separately obtained history, performing a medically appropriate examination, counseling and educating the patient, ordering tests,documenting clinical information in the electronic health record, and care coordination.   Lincoln Brigham, PA-C Hematology and Oncology [facility]

## 2021-04-19 ENCOUNTER — Inpatient Hospital Stay: Payer: Medicare Other

## 2021-04-19 ENCOUNTER — Other Ambulatory Visit: Payer: Self-pay

## 2021-04-19 ENCOUNTER — Inpatient Hospital Stay: Payer: Medicare Other | Attending: Hematology

## 2021-04-19 VITALS — BP 145/76 | HR 70 | Temp 98.6°F | Resp 18 | Wt 196.0 lb

## 2021-04-19 DIAGNOSIS — C9001 Multiple myeloma in remission: Secondary | ICD-10-CM

## 2021-04-19 DIAGNOSIS — Z95828 Presence of other vascular implants and grafts: Secondary | ICD-10-CM

## 2021-04-19 DIAGNOSIS — Z7189 Other specified counseling: Secondary | ICD-10-CM

## 2021-04-19 DIAGNOSIS — C9000 Multiple myeloma not having achieved remission: Secondary | ICD-10-CM | POA: Insufficient documentation

## 2021-04-19 DIAGNOSIS — Z452 Encounter for adjustment and management of vascular access device: Secondary | ICD-10-CM | POA: Insufficient documentation

## 2021-04-19 DIAGNOSIS — Z23 Encounter for immunization: Secondary | ICD-10-CM

## 2021-04-19 DIAGNOSIS — Z5112 Encounter for antineoplastic immunotherapy: Secondary | ICD-10-CM | POA: Insufficient documentation

## 2021-04-19 LAB — CMP (CANCER CENTER ONLY)
ALT: 22 U/L (ref 0–44)
AST: 24 U/L (ref 15–41)
Albumin: 3.6 g/dL (ref 3.5–5.0)
Alkaline Phosphatase: 60 U/L (ref 38–126)
Anion gap: 9 (ref 5–15)
BUN: 17 mg/dL (ref 8–23)
CO2: 23 mmol/L (ref 22–32)
Calcium: 9.2 mg/dL (ref 8.9–10.3)
Chloride: 109 mmol/L (ref 98–111)
Creatinine: 1.04 mg/dL — ABNORMAL HIGH (ref 0.44–1.00)
GFR, Estimated: 59 mL/min — ABNORMAL LOW (ref >60)
Glucose, Bld: 86 mg/dL (ref 70–99)
Potassium: 4.2 mmol/L (ref 3.5–5.1)
Sodium: 141 mmol/L (ref 135–145)
Total Bilirubin: 0.7 mg/dL (ref 0.3–1.2)
Total Protein: 6.3 g/dL — ABNORMAL LOW (ref 6.5–8.1)

## 2021-04-19 LAB — CBC WITH DIFFERENTIAL/PLATELET
Abs Immature Granulocytes: 0.01 10*3/uL (ref 0.00–0.07)
Basophils Absolute: 0 10*3/uL (ref 0.0–0.1)
Basophils Relative: 1 %
Eosinophils Absolute: 0.1 10*3/uL (ref 0.0–0.5)
Eosinophils Relative: 2 %
HCT: 32 % — ABNORMAL LOW (ref 36.0–46.0)
Hemoglobin: 10.5 g/dL — ABNORMAL LOW (ref 12.0–15.0)
Immature Granulocytes: 0 %
Lymphocytes Relative: 48 %
Lymphs Abs: 1.3 10*3/uL (ref 0.7–4.0)
MCH: 30.8 pg (ref 26.0–34.0)
MCHC: 32.8 g/dL (ref 30.0–36.0)
MCV: 93.8 fL (ref 80.0–100.0)
Monocytes Absolute: 0.4 10*3/uL (ref 0.1–1.0)
Monocytes Relative: 15 %
Neutro Abs: 1 10*3/uL — ABNORMAL LOW (ref 1.7–7.7)
Neutrophils Relative %: 34 %
Platelets: 169 10*3/uL (ref 150–400)
RBC: 3.41 MIL/uL — ABNORMAL LOW (ref 3.87–5.11)
RDW: 13.5 % (ref 11.5–15.5)
WBC: 2.8 10*3/uL — ABNORMAL LOW (ref 4.0–10.5)
nRBC: 0 % (ref 0.0–0.2)

## 2021-04-19 MED ORDER — ZOLEDRONIC ACID 4 MG/100ML IV SOLN
INTRAVENOUS | Status: AC
  Filled 2021-04-19: qty 100

## 2021-04-19 MED ORDER — ACETAMINOPHEN 500 MG PO TABS
1000.0000 mg | ORAL_TABLET | Freq: Once | ORAL | Status: AC
  Administered 2021-04-19: 1000 mg via ORAL

## 2021-04-19 MED ORDER — DEXAMETHASONE SODIUM PHOSPHATE 10 MG/ML IJ SOLN
INTRAMUSCULAR | Status: AC
  Filled 2021-04-19: qty 1

## 2021-04-19 MED ORDER — DEXAMETHASONE SODIUM PHOSPHATE 10 MG/ML IJ SOLN
6.0000 mg | Freq: Once | INTRAMUSCULAR | Status: AC
  Administered 2021-04-19: 6 mg via INTRAVENOUS

## 2021-04-19 MED ORDER — SODIUM CHLORIDE 0.9 % IV SOLN
Freq: Once | INTRAVENOUS | Status: AC
  Filled 2021-04-19: qty 250

## 2021-04-19 MED ORDER — ZOLEDRONIC ACID 4 MG/100ML IV SOLN
4.0000 mg | Freq: Once | INTRAVENOUS | Status: AC
  Administered 2021-04-19: 4 mg via INTRAVENOUS

## 2021-04-19 MED ORDER — ACETAMINOPHEN 500 MG PO TABS
ORAL_TABLET | ORAL | Status: AC
  Filled 2021-04-19: qty 2

## 2021-04-19 MED ORDER — PROCHLORPERAZINE MALEATE 10 MG PO TABS
10.0000 mg | ORAL_TABLET | Freq: Once | ORAL | Status: AC
  Administered 2021-04-19: 10 mg via ORAL

## 2021-04-19 MED ORDER — SODIUM CHLORIDE 0.9% FLUSH
10.0000 mL | Freq: Once | INTRAVENOUS | Status: AC
  Administered 2021-04-19: 10 mL
  Filled 2021-04-19: qty 10

## 2021-04-19 MED ORDER — SODIUM CHLORIDE 0.9% FLUSH
10.0000 mL | INTRAVENOUS | Status: DC | PRN
  Administered 2021-04-19: 10 mL
  Filled 2021-04-19: qty 10

## 2021-04-19 MED ORDER — PROCHLORPERAZINE MALEATE 10 MG PO TABS
ORAL_TABLET | ORAL | Status: AC
  Filled 2021-04-19: qty 1

## 2021-04-19 MED ORDER — DEXTROSE 5 % IV SOLN
56.0000 mg/m2 | Freq: Once | INTRAVENOUS | Status: AC
  Administered 2021-04-19: 120 mg via INTRAVENOUS
  Filled 2021-04-19: qty 60

## 2021-04-19 MED ORDER — HEPARIN SOD (PORK) LOCK FLUSH 100 UNIT/ML IV SOLN
500.0000 [IU] | Freq: Once | INTRAVENOUS | Status: AC | PRN
Start: 2021-04-19 — End: 2021-04-19
  Administered 2021-04-19: 500 [IU]
  Filled 2021-04-19: qty 5

## 2021-04-19 NOTE — Patient Instructions (Signed)
[facility]ICAL [facility]COL[facility]  Discharge Instructions: Thank you for choosing [facility] to provide your oncology and hematology care.   If you have a lab appointment with the Cancer Center, please go directly to the Cancer Center and check in at the registration area.   Wear comfortable clothing and clothing appropriate for easy access to any Portacath or PICC line.   We strive to give you quality time with your provider. You may need to reschedule your appointment if you arrive late (15 or more minutes).  Arriving late affects you and other patients whose appointments are after yours.  Also, if you miss three or more appointments without notifying the office, you may be dismissed from the clinic at the provider's discretion.      For prescription refill requests, have your pharmacy contact our office and allow 72 hours for refills to be completed.    Today you received the following chemotherapy and/or immunotherapy agents: Kyprolis    To help prevent nausea and vomiting after your treatment, we encourage you to take your nausea medication as directed.  BELOW ARE SYMPTOMS THAT SHOULD BE REPORTED IMMEDIATELY: . *FEVER GREATER THAN 100.4 F (38 C) OR HIGHER . *CHILLS OR SWEATING . *NAUSEA AND VOMITING THAT IS NOT CONTROLLED WITH YOUR NAUSEA MEDICATION . *UNUSUAL SHORTNESS OF BREATH . *UNUSUAL BRUISING OR BLEEDING . *URINARY PROBLEMS (pain or burning when urinating, or frequent urination) . *BOWEL PROBLEMS (unusual diarrhea, constipation, pain near the anus) . TENDERNESS IN MOUTH AND THROAT WITH OR WITHOUT PRESENCE OF ULCERS (sore throat, sores in mouth, or a toothache) . UNUSUAL RASH, SWELLING OR PAIN  . UNUSUAL VAGINAL DISCHARGE OR ITCHING   Items with * indicate a potential emergency and should be followed up as soon as possible or go to the Emergency Department if any problems should occur.  Please show the CHEMOTHERAPY ALERT CARD or IMMUNOTHERAPY ALERT  CARD at check-in to the Emergency Department and triage nurse.  Should you have questions after your visit or need to cancel or reschedule your appointment, please contact [facility]  Dept: [phone]  and follow the prompts.  Office hours are 8:00 a.m. to 4:30 p.m. Monday - Friday. Please note that voicemails left after 4:00 p.m. may not be returned until the following business day.  We are closed weekends and major holidays. You have access to a nurse at all times for urgent questions. Please call the main number to the clinic Dept: [phone] and follow the prompts.   For any non-urgent questions, you may also contact your provider using MyChart. We now offer e-Visits for anyone 18 and older to request care online for non-urgent symptoms. For details visit [email or web address].   Also download the MyChart app! Go to the app store, search "MyChart", open the app, select [facility], and log in with your MyChart username and password.  Due to Covid, a mask is required upon entering the hospital/clinic. If you do not have a mask, one will be given to you upon arrival. For doctor visits, patients may have 1 support person aged 18 or older with them. For treatment visits, patients cannot have anyone with them due to current Covid guidelines and our immunocompromised population.   

## 2021-04-19 NOTE — Progress Notes (Signed)
Okay to treat with ANC 1.0 per Dr. Irene Limbo

## 2021-04-20 LAB — KAPPA/LAMBDA LIGHT CHAINS
Kappa free light chain: 9.8 mg/L (ref 3.3–19.4)
Kappa, lambda light chain ratio: 1.21 (ref 0.26–1.65)
Lambda free light chains: 8.1 mg/L (ref 5.7–26.3)

## 2021-04-24 ENCOUNTER — Other Ambulatory Visit: Payer: Self-pay | Admitting: Hematology

## 2021-04-24 MED ORDER — PHENAZOPYRIDINE HCL 97.2 MG PO TABS: 97.0000 mg | ORAL_TABLET | Freq: Three times a day (TID) | ORAL | 0 refills | Status: DC | PRN

## 2021-04-24 MED ORDER — CEFPODOXIME PROXETIL 100 MG PO TABS: 100.0000 mg | ORAL_TABLET | Freq: Two times a day (BID) | ORAL | 0 refills | Status: AC

## 2021-04-24 NOTE — Progress Notes (Signed)
Patient with symptoms of Dysuria and likely UTI -sent prescription for vantin and phenazopyridine to her pharmacy after talking with her  .[place]

## 2021-04-25 ENCOUNTER — Other Ambulatory Visit: Payer: Self-pay

## 2021-04-25 ENCOUNTER — Ambulatory Visit
Admission: EM | Admit: 2021-04-25 | Discharge: 2021-04-25 | Disposition: A | Discharge status: Home / Self Care | Payer: Medicare Other | Attending: Family Medicine | Admitting: Family Medicine

## 2021-04-25 DIAGNOSIS — N390 Urinary tract infection, site not specified: Secondary | ICD-10-CM

## 2021-04-25 LAB — MULTIPLE MYELOMA PANEL, SERUM
Albumin SerPl Elph-Mcnc: 3.7 g/dL (ref 2.9–4.4)
Albumin/Glob SerPl: 1.7 (ref 0.7–1.7)
Alpha 1: 0.2 g/dL (ref 0.0–0.4)
Alpha2 Glob SerPl Elph-Mcnc: 0.7 g/dL (ref 0.4–1.0)
B-Globulin SerPl Elph-Mcnc: 0.8 g/dL (ref 0.7–1.3)
Gamma Glob SerPl Elph-Mcnc: 0.5 g/dL (ref 0.4–1.8)
Globulin, Total: 2.3 g/dL (ref 2.2–3.9)
IgA: 14 mg/dL — ABNORMAL LOW (ref 87–352)
IgG (Immunoglobin G), Serum: 531 mg/dL — ABNORMAL LOW (ref 586–1602)
IgM (Immunoglobulin M), Srm: 39 mg/dL (ref 26–217)
Total Protein ELP: 6 g/dL (ref 6.0–8.5)

## 2021-04-25 LAB — POCT URINALYSIS DIP (MANUAL ENTRY)
Bilirubin, UA: NEGATIVE
Glucose, UA: NEGATIVE mg/dL
Ketones, POC UA: NEGATIVE mg/dL
Nitrite, UA: NEGATIVE
Protein Ur, POC: 100 mg/dL — AB
Spec Grav, UA: 1.02 (ref 1.010–1.025)
Urobilinogen, UA: 0.2 E.U./dL
pH, UA: 5.5 (ref 5.0–8.0)

## 2021-04-25 MED ORDER — PHENAZOPYRIDINE HCL 97.2 MG PO TABS: 97.0000 mg | ORAL_TABLET | Freq: Three times a day (TID) | ORAL | 0 refills | Status: DC | PRN

## 2021-04-25 NOTE — ED Triage Notes (Signed)
Pt states that she has some lower abdominal pain, right side pain and pain with urination. X3 days

## 2021-04-27 LAB — URINE CULTURE: Culture: NO GROWTH

## 2021-04-28 NOTE — ED Provider Notes (Signed)
[facility]    CSN: [id number] Arrival date & time: 04/25/21  1350      History   Chief Complaint Chief Complaint  Patient presents with   Abdominal Pain    Pt states that she has some lower abdominal pressure and right side pain. Pt states that she has some pain with urination. X3 days    HPI Madison Parker is a 70 y.o. female.   HPI Madison Parker is a 70 y.o. female presents for evaluation of urinary frequency, urgency and dysuria x 3 days, endorses associated lower pelvic pressure and right flank pain. Taking one dose of antibiotics prescribed by PCP which she vomited over an hour after taking medication. Currently receiving cancer treatment . No history of recurrent UTI or renal stones.No LMP recorded. Patient has had a hysterectomy.   Past Medical History:  Diagnosis Date   Headache    Low back pain    SBO (small bowel obstruction) ([place]) 2010   Smoldering multiple myeloma ([place])     Patient Active Problem List   Diagnosis Date Noted   Port-A-Cath in place 04/27/2019   Multiple myeloma not having achieved remission ([place]) 04/16/2019   Counseling regarding advance care planning and goals of care 04/16/2019   Primary osteoarthritis of left hip 08/16/2017   Hypothyroidism 11/08/2016   Smoldering multiple myeloma ([place]) 11/08/2016   H/O bariatric surgery 02/02/2016   Degenerative joint disease of left hip 01/18/2016   Anemia 11/22/2015   Left hip pain 11/22/2015   Left shoulder pain 11/22/2015   Lumbar spondylosis 11/22/2015    Past Surgical History:  Procedure Laterality Date   ABDOMINAL HYSTERECTOMY     complete   BACK SURGERY     lower at [place] PROPOFOL N/A 08/23/2020   Procedure: COLONOSCOPY WITH PROPOFOL;  Surgeon: Harvel Quale, MD;  [facility];  Service: Gastroenterology;  Laterality: N/A;  900   colonscopy  2014   GASTRIC BYPASS  yrs ago   HERNIA REPAIR     IR IMAGING GUIDED PORT INSERTION  04/20/2019    KNEE SURGERY  06/04/2009   both knees replaced    POLYPECTOMY  08/23/2020   Procedure: POLYPECTOMY;  Surgeon: Harvel Quale, MD;  [facility];  Service: Gastroenterology;;   TONSILLECTOMY  age 42   TOTAL HIP ARTHROPLASTY Left 08/16/2017   Procedure: LEFT TOTAL HIP ARTHROPLASTY ANTERIOR APPROACH;  Surgeon: Dorna Leitz, MD;  [facility];  Service: Orthopedics;  Laterality: Left;    OB History   No obstetric history on file.      Home Medications    Prior to Admission medications   Medication Sig Start Date End Date Taking? Authorizing Provider  acyclovir (ZOVIRAX) 800 MG tablet  12/23/19  Yes [provider]  Calcium Carbonate-Vitamin D 600-200 MG-UNIT TABS Calcium 600 with Vitamin D3 600 mg-5 mcg (200 unit) tablet  Take 1 tablet every day by oral route as directed.   Yes [provider]  cefpodoxime (VANTIN) 100 MG tablet Take 1 tablet (100 mg total) by mouth 2 (two) times daily for 7 days. 04/24/21 05/01/21 Yes Brunetta Genera, MD  fluconazole (DIFLUCAN) 100 MG tablet  02/25/21  Yes [provider]  FOLIC ACID PO Take 4,627 mg by mouth daily.    Yes [provider]  lidocaine-prilocaine (EMLA) cream Apply 1 application topically as needed. 05/26/19  Yes Brunetta Genera, MD  NYSTATIN powder Apply topically 2 (two) times daily.  02/22/21  Yes Brunetta Genera, MD  nystatin-triamcinolone [facility] II) cream Apply topically 2 (two) times daily. 02/22/21  Yes Brunetta Genera, MD  Probiotic Product (PROBIOTIC DAILY PO) Take 2 capsules by mouth daily. Gummies   Yes [provider]  vitamin B-12 (CYANOCOBALAMIN) 1000 MCG tablet Take 1,000 mcg by mouth daily.    Yes [provider]  Vitamin D, Ergocalciferol, (DRISDOL) 1.25 MG (50000 UNIT) CAPS capsule TAKE 1 CAPSULE ONCE A WEEK Patient taking differently: Take 50,000 Units by mouth every 7 (seven) days. Sunday 07/11/20  Yes Brunetta Genera, MD   phenazopyridine (PYRIDIUM) 97 MG tablet Take 1 tablet (97 mg total) by mouth 3 (three) times daily as needed for pain. For urinary discomfort. 04/25/21   Scot Jun, FNP    Family History History reviewed. No pertinent family history.  Social History Social History   Tobacco Use   Smoking status: Former    Packs/day: 0.50    Years: 10.00    Pack years: 5.00    Types: Cigarettes   Smokeless tobacco: Never   Tobacco comments:    quit 1977  Vaping Use   Vaping Use: Never used  Substance Use Topics   Alcohol use: Yes    Comment: occ   Drug use: No     Allergies   Codeine   Review of Systems Review of Systems Pertinent negatives listed in HPI  Physical Exam Triage Vital Signs ED Triage Vitals  Enc Vitals Group     BP 04/25/21 1419 (!) 150/90     Pulse Rate 04/25/21 1419 96     Resp --      Temp 04/25/21 1419 99.1 F (37.3 C)     Temp src --      SpO2 04/25/21 1419 95 %     Weight 04/25/21 1417 194 lb (88 kg)     Height 04/25/21 1417 _0  (1.549 m)     Head Circumference --      Peak Flow --      Pain Score 04/25/21 1417 8     Pain Loc --      Pain Edu? --      Excl. in [place]? --    No data found.  Updated Vital Signs BP (!) 150/90 (BP Location: Right Arm)   Pulse 96   Temp 99.1 F (37.3 C)   Ht _1  (1.549 m)   Wt 194 lb (88 kg)   SpO2 95%   BMI 36.66 kg/m   Visual Acuity Right Eye Distance:   Left Eye Distance:   Bilateral Distance:    Right Eye Near:   Left Eye Near:    Bilateral Near:     Physical Exam General appearance: Alert, well developed, well nourished, cooperative  Head: Normocephalic, without obvious abnormality, atraumatic Respiratory: Respirations even and unlabored, normal respiratory rate Heart: Rate and rhythm normal. No gallop or murmurs noted on exam  Abdomen: BS +, no distention, no reproducible CVA tenderness or rebound tenderness Extremities: No gross deformities Skin: Skin color, texture, turgor normal. No  rashes seen  Psych: Appropriate mood and affect. Neurologic:No focal deficits   UC Treatments / Results  Labs (all labs ordered are listed, but only abnormal results are displayed) Labs Reviewed  POCT URINALYSIS DIP (MANUAL ENTRY) - Abnormal; Notable for the following components:      Result Value   Blood, UA trace-intact (*)    Protein Ur, POC =100 (*)    Leukocytes, UA  Small (1+) (*)    All other components within normal limits  URINE CULTURE    EKG   Radiology No results found.  Procedures Procedures (including critical care time)  Medications Ordered in UC Medications - No data to display  Initial Impression / Assessment and Plan / UC Course  I have reviewed the triage vital signs and the nursing notes.  Pertinent labs & imaging results that were available during my care of the patient were reviewed by me and considered in my medical decision making (see chart for details).     Continue antibiotic, avoid taking on an empty stomach. Take home Zofran one hour prior to taking antibiotics. Take pyridione as needed for pain.  Final Clinical Impressions(s) / UC Diagnoses   Final diagnoses:  Acute urinary tract infection   Discharge Instructions   None    ED Prescriptions     Medication Sig Dispense Auth. Provider   phenazopyridine (PYRIDIUM) 97 MG tablet Take 1 tablet (97 mg total) by mouth 3 (three) times daily as needed for pain. For urinary discomfort. 18 tablet Scot Jun, FNP      PDMP not reviewed this encounter.   Scot Jun, FNP 04/28/21 [phone]

## 2021-05-03 ENCOUNTER — Inpatient Hospital Stay: Payer: Medicare Other

## 2021-05-03 ENCOUNTER — Other Ambulatory Visit: Payer: Self-pay | Admitting: Hematology

## 2021-05-03 ENCOUNTER — Inpatient Hospital Stay (HOSPITAL_BASED_OUTPATIENT_CLINIC_OR_DEPARTMENT_OTHER): Payer: Medicare Other | Admitting: Physician Assistant

## 2021-05-03 ENCOUNTER — Other Ambulatory Visit: Payer: Self-pay

## 2021-05-03 ENCOUNTER — Encounter: Payer: Self-pay | Admitting: Hematology

## 2021-05-03 VITALS — BP 129/69 | HR 79 | Temp 98.3°F | Resp 18 | Ht 61.0 in | Wt 195.2 lb

## 2021-05-03 DIAGNOSIS — Z95828 Presence of other vascular implants and grafts: Secondary | ICD-10-CM

## 2021-05-03 DIAGNOSIS — C9000 Multiple myeloma not having achieved remission: Secondary | ICD-10-CM

## 2021-05-03 DIAGNOSIS — Z452 Encounter for adjustment and management of vascular access device: Secondary | ICD-10-CM | POA: Diagnosis not present

## 2021-05-03 DIAGNOSIS — Z5111 Encounter for antineoplastic chemotherapy: Secondary | ICD-10-CM

## 2021-05-03 DIAGNOSIS — Z23 Encounter for immunization: Secondary | ICD-10-CM

## 2021-05-03 DIAGNOSIS — C9001 Multiple myeloma in remission: Secondary | ICD-10-CM

## 2021-05-03 DIAGNOSIS — Z5112 Encounter for antineoplastic immunotherapy: Secondary | ICD-10-CM | POA: Diagnosis not present

## 2021-05-03 DIAGNOSIS — Z7189 Other specified counseling: Secondary | ICD-10-CM

## 2021-05-03 LAB — CMP (CANCER CENTER ONLY)
ALT: 31 U/L (ref 0–44)
AST: 26 U/L (ref 15–41)
Albumin: 4.2 g/dL (ref 3.5–5.0)
Alkaline Phosphatase: 81 U/L (ref 38–126)
Anion gap: 10 (ref 5–15)
BUN: 14 mg/dL (ref 8–23)
CO2: 23 mmol/L (ref 22–32)
Calcium: 9.1 mg/dL (ref 8.9–10.3)
Chloride: 110 mmol/L (ref 98–111)
Creatinine: 0.82 mg/dL (ref 0.44–1.00)
GFR, Estimated: >60 mL/min (ref >60)
Glucose, Bld: 86 mg/dL (ref 70–99)
Potassium: 3.7 mmol/L (ref 3.5–5.1)
Sodium: 143 mmol/L (ref 135–145)
Total Bilirubin: 1.1 mg/dL (ref 0.3–1.2)
Total Protein: 7.3 g/dL (ref 6.5–8.1)

## 2021-05-03 LAB — CBC WITH DIFFERENTIAL/PLATELET
Abs Immature Granulocytes: 0.03 10*3/uL (ref 0.00–0.07)
Basophils Absolute: 0 10*3/uL (ref 0.0–0.1)
Basophils Relative: 1 %
Eosinophils Absolute: 0.1 10*3/uL (ref 0.0–0.5)
Eosinophils Relative: 1 %
HCT: 29 % — ABNORMAL LOW (ref 36.0–46.0)
Hemoglobin: 9.2 g/dL — ABNORMAL LOW (ref 12.0–15.0)
Immature Granulocytes: 1 %
Lymphocytes Relative: 28 %
Lymphs Abs: 1.5 10*3/uL (ref 0.7–4.0)
MCH: 31.2 pg (ref 26.0–34.0)
MCHC: 31.7 g/dL (ref 30.0–36.0)
MCV: 98.3 fL (ref 80.0–100.0)
Monocytes Absolute: 0.4 10*3/uL (ref 0.1–1.0)
Monocytes Relative: 7 %
Neutro Abs: 3.2 10*3/uL (ref 1.7–7.7)
Neutrophils Relative %: 62 %
Platelets: 283 10*3/uL (ref 150–400)
RBC: 2.95 MIL/uL — ABNORMAL LOW (ref 3.87–5.11)
RDW: 15.4 % (ref 11.5–15.5)
WBC: 5.2 10*3/uL (ref 4.0–10.5)
nRBC: 0.4 % — ABNORMAL HIGH (ref 0.0–0.2)

## 2021-05-03 MED ORDER — DEXAMETHASONE SODIUM PHOSPHATE 10 MG/ML IJ SOLN
INTRAMUSCULAR | Status: AC
  Filled 2021-05-03: qty 1

## 2021-05-03 MED ORDER — SODIUM CHLORIDE 0.9% FLUSH
10.0000 mL | INTRAVENOUS | Status: DC | PRN
  Administered 2021-05-03: 10 mL
  Filled 2021-05-03: qty 10

## 2021-05-03 MED ORDER — HEPARIN SOD (PORK) LOCK FLUSH 100 UNIT/ML IV SOLN
500.0000 [IU] | Freq: Once | INTRAVENOUS | Status: AC | PRN
  Administered 2021-05-03: 500 [IU]
  Filled 2021-05-03: qty 5

## 2021-05-03 MED ORDER — ACETAMINOPHEN 500 MG PO TABS
ORAL_TABLET | ORAL | Status: AC
  Filled 2021-05-03: qty 2

## 2021-05-03 MED ORDER — SODIUM CHLORIDE 0.9 % IV SOLN
Freq: Once | INTRAVENOUS | Status: AC
  Filled 2021-05-03: qty 250

## 2021-05-03 MED ORDER — DEXAMETHASONE SODIUM PHOSPHATE 10 MG/ML IJ SOLN
6.0000 mg | Freq: Once | INTRAMUSCULAR | Status: AC
  Administered 2021-05-03: 6 mg via INTRAVENOUS

## 2021-05-03 MED ORDER — DEXTROSE 5 % IV SOLN
56.0000 mg/m2 | Freq: Once | INTRAVENOUS | Status: AC
  Administered 2021-05-03: 120 mg via INTRAVENOUS
  Filled 2021-05-03: qty 60

## 2021-05-03 MED ORDER — ALTEPLASE 2 MG IJ SOLR
2.0000 mg | Freq: Once | INTRAMUSCULAR | Status: AC
  Administered 2021-05-03: 2 mg
  Filled 2021-05-03: qty 2

## 2021-05-03 MED ORDER — PROCHLORPERAZINE MALEATE 10 MG PO TABS
ORAL_TABLET | ORAL | Status: AC
  Filled 2021-05-03: qty 1

## 2021-05-03 MED ORDER — SODIUM CHLORIDE 0.9% FLUSH
10.0000 mL | Freq: Once | INTRAVENOUS | Status: AC
  Administered 2021-05-03: 10 mL
  Filled 2021-05-03: qty 10

## 2021-05-03 MED ORDER — ACETAMINOPHEN 500 MG PO TABS
1000.0000 mg | ORAL_TABLET | Freq: Once | ORAL | Status: AC
  Administered 2021-05-03: 1000 mg via ORAL

## 2021-05-03 MED ORDER — ALTEPLASE 2 MG IJ SOLR
INTRAMUSCULAR | Status: AC
  Filled 2021-05-03: qty 2

## 2021-05-03 MED ORDER — PROCHLORPERAZINE MALEATE 10 MG PO TABS
10.0000 mg | ORAL_TABLET | Freq: Once | ORAL | Status: AC
  Administered 2021-05-03: 10 mg via ORAL

## 2021-05-03 NOTE — Progress Notes (Signed)
Unable to get blood from patient's port. Cathflo was administered at 1325 by Seth Bake, RN. Patient sent to lab for peripheral lab draw.

## 2021-05-03 NOTE — Progress Notes (Signed)
[place]   [phone] [phone]   Clinic Follow up Note   Patient Care Team: Celene Squibb, MD as PCP - General (Internal Medicine) Brunetta Genera, MD as Consulting Physician (Hematology) Dorna Leitz, MD as Consulting Physician (Orthopedic Surgery) 05/03/2021  CHIEF COMPLAINT: Follow up multiple myeloma   CURRENT THERAPY: maintenance carfilzomib days 1, 15 q. 28 days  INTERVAL HISTORY: Ms. Cannan returns for follow-up and treatment as scheduled. She was last seen on 04/05/2021. Patient is unaccompanied for this visit.   On exam today, Ms. Hewes reports that she is doing well. She notes mild increase in fatigue since the last visit however she continues to complete all her ADLs on her own. She has a good appetite and stable weight. She notes that the fungal rash has improved and is mainly involving the axillary region bilaterally. She recently completed a 7 day course of antibiotics for UTI with improvement of symptoms. She denies any nausea, vomiting or abdominal pain. Her bowel movements are regular without any constipation or diarrhea. Patient has chronic but mild right hip pain that worsens with exercising and walking. She takes Tylenol sparingly when the pain intensifies. Patient denies any fevers, chills, night sweats, shortness of breath, chest pain or cough. She has no other complaints.    MEDICAL HISTORY:  Past Medical History:  Diagnosis Date   Headache    Low back pain    SBO (small bowel obstruction) ([place]) 2010   Smoldering multiple myeloma ([place])     SURGICAL HISTORY: Past Surgical History:  Procedure Laterality Date   ABDOMINAL HYSTERECTOMY     complete   BACK SURGERY     lower at [facility]   COLONOSCOPY WITH PROPOFOL N/A 08/23/2020   Procedure: COLONOSCOPY WITH PROPOFOL;  Surgeon: Harvel Quale, MD;  Location: [facility];  Service: Gastroenterology;  Laterality: N/A;  900   colonscopy  2014   GASTRIC BYPASS   yrs ago   HERNIA REPAIR     IR IMAGING GUIDED PORT INSERTION  04/20/2019   KNEE SURGERY  06/04/2009   both knees replaced    POLYPECTOMY  08/23/2020   Procedure: POLYPECTOMY;  Surgeon: Harvel Quale, MD;  Location: [facility];  Service: Gastroenterology;;   TONSILLECTOMY  age 3   TOTAL HIP ARTHROPLASTY Left 08/16/2017   Procedure: LEFT TOTAL HIP ARTHROPLASTY ANTERIOR APPROACH;  Surgeon: Dorna Leitz, MD;  Location: [facility];  Service: Orthopedics;  Laterality: Left;    I have reviewed the social history and family history with the patient and they are unchanged from previous note.  ALLERGIES:  is allergic to codeine.  MEDICATIONS:  Current Outpatient Medications  Medication Sig Dispense Refill   acyclovir (ZOVIRAX) 800 MG tablet      Calcium Carbonate-Vitamin D 600-200 MG-UNIT TABS Calcium 600 with Vitamin D3 600 mg-5 mcg (200 unit) tablet  Take 1 tablet every day by oral route as directed.     FOLIC ACID PO Take 2,706 mg by mouth daily.      lidocaine-prilocaine (EMLA) cream Apply 1 application topically as needed. 30 g 2   NYSTATIN powder Apply topically 2 (two) times daily. 15 g 1   nystatin-triamcinolone (MYCOLOG II) cream Apply topically 2 (two) times daily. 30 g 1   Probiotic Product (PROBIOTIC DAILY PO) Take 2 capsules by mouth daily. Gummies     vitamin B-12 (CYANOCOBALAMIN) 1000 MCG tablet Take 1,000 mcg by mouth daily.      Vitamin D, Ergocalciferol, (  DRISDOL) 1.25 MG (50000 UNIT) CAPS capsule TAKE 1 CAPSULE ONCE A WEEK (Patient taking differently: Take 50,000 Units by mouth every 7 (seven) days. Sunday) 13 capsule 3   No current facility-administered medications for this visit.   Facility-Administered Medications Ordered in Other Visits  Medication Dose Route Frequency Provider Last Rate Last Admin   sodium chloride flush (NS) 0.9 % injection 10 mL  10 mL Intracatheter PRN Brunetta Genera, MD   10 mL at 05/11/19 1437   sodium chloride flush (NS) 0.9 %  injection 10 mL  10 mL Intracatheter PRN Brunetta Genera, MD   10 mL at 05/03/21 1604   REVIEW OF SYSTEMS: Review of Systems  Constitutional:  Positive for malaise/fatigue (mild fatigue). Negative for chills, fever and weight loss.  Respiratory:  Negative for cough and shortness of breath.   Cardiovascular:  Negative for chest pain.  Gastrointestinal:  Negative for abdominal pain, blood in stool, constipation, diarrhea, melena, nausea and vomiting.  Skin:  Positive for rash (Nearly resolved fungal rash, now mainly involving the axillary area).  Neurological:  Negative for dizziness and headaches.  Endo/Heme/Allergies:  Does not bruise/bleed easily.    PHYSICAL EXAMINATION: ECOG PERFORMANCE STATUS: 1 - Symptomatic but completely ambulatory  Vitals:   05/03/21 1350  BP: 129/69  Pulse: 79  Resp: 18  Temp: 98.3 F (36.8 C)  SpO2: 99%   Filed Weights   05/03/21 1350  Weight: 195 lb 3.2 oz (88.5 kg)    GENERAL:alert, no distress and comfortable SKIN: No rash to exposed skin EYES: sclera clear LUNGS:  normal breathing effort HEART:  no lower extremity edema NEURO: alert & oriented x 3 with fluent speech, no focal motor deficits PAC without erythema  LABORATORY DATA:  I have reviewed the data as listed CBC Latest Ref Rng & Units 05/03/2021 04/19/2021 04/05/2021  WBC 4.0 - 10.5 K/uL 5.2 2.8(L) 4.1  Hemoglobin 12.0 - 15.0 g/dL 9.2(L) 10.5(L) 10.2(L)  Hematocrit 36.0 - 46.0 % 29.0(L) 32.0(L) 30.8(L)  Platelets 150 - 400 K/uL 283 169 161     CMP Latest Ref Rng & Units 05/03/2021 04/19/2021 04/05/2021  Glucose 70 - 99 mg/dL 86 86 90  BUN 8 - 23 mg/dL 14 17 16   Creatinine 0.44 - 1.00 mg/dL 0.82 1.04(H) 0.90  Sodium 135 - 145 mmol/L 143 141 142  Potassium 3.5 - 5.1 mmol/L 3.7 4.2 3.9  Chloride 98 - 111 mmol/L 110 109 113(H)  CO2 22 - 32 mmol/L 23 23 22   Calcium 8.9 - 10.3 mg/dL 9.1 9.2 8.9  Total Protein 6.5 - 8.1 g/dL 7.3 6.3(L) 6.1(L)  Total Bilirubin 0.3 - 1.2 mg/dL 1.1 0.7  0.8  Alkaline Phos 38 - 126 U/L 81 60 61  AST 15 - 41 U/L 26 24 19   ALT 0 - 44 U/L 31 22 18      RADIOGRAPHIC STUDIES: No results found.   ASSESSMENT & PLAN: 70 year old female  1.  Multiple myeloma: -Initially diagnosed as smoldering myeloma in 2011.  Initial BMBX 17% kappa restricted plasma cells consistent with plasma cell neoplasm -BM BX 03/11/2019 revealed involvement by 50% plasma cells, genetics high risk T (14; 16) -Staging PET 04/01/2019 showed no evidence of active myeloma  -S/p induction therapy with carfilzomib, lenalidomide, and Dex on 04/27/2019, achieved normalization of light chains by start of cycle 4 on 07/28/19 -Developed DVT 06/24/2019 started on apixaban -S/p autologous stem cell transplant with melphalan prep 09/15/2019 at [facility] by Dr. Aris Lot -Current regimen includes maintenance  carfilzomib days 1, 15 q. 28 days with Dex. -Receives Zometa every 3 months, last dose on 01/25/2021. -Last MM Panel from 02/22/2021 was stable, M protein not detected and normal K/L light chain ratio.  -Ms. Orman returns today,05/03/2021 for a follow up visit prior to Carfilzomib infusion. Labs from today were reviewed. Hemoglobin has decreased to 9.2. ANC is within normal limits. CMP was unremarkable. Myeloma labs are pending. Recommend to proceed with treatment today. Recommend to continue to hold dex as rash has not completely resolved.  Ms. Treiber will return to see Dr. Joaquina Nissen [place] on 05/17/2021 before her next treatment.   All questions were answered. The patient knows to call the clinic with any problems, questions or concerns. No barriers to learning were detected.  I have spent a total of 30 minutes minutes of face-to-face and non-face-to-face time, preparing to see the patient, obtaining and/or reviewing separately obtained history, performing a medically appropriate examination, counseling and educating the patient, ordering tests,documenting clinical information in the electronic health record,  and care coordination.   Lincoln Brigham, PA-C Hematology and [place]

## 2021-05-03 NOTE — Patient Instructions (Signed)
[facility]  Discharge Instructions: Thank you for choosing [facility] to provide your oncology and hematology care.   If you have a lab appointment with the Cancer Center, please go directly to the Cancer Center and check in at the registration area.   Wear comfortable clothing and clothing appropriate for easy access to any Portacath or PICC line.   We strive to give you quality time with your provider. You may need to reschedule your appointment if you arrive late (15 or more minutes).  Arriving late affects you and other patients whose appointments are after yours.  Also, if you miss three or more appointments without notifying the office, you may be dismissed from the clinic at the provider's discretion.      For prescription refill requests, have your pharmacy contact our office and allow 72 hours for refills to be completed.    Today you received the following chemotherapy and/or immunotherapy agents: Kyprolis    To help prevent nausea and vomiting after your treatment, we encourage you to take your nausea medication as directed.  BELOW ARE SYMPTOMS THAT SHOULD BE REPORTED IMMEDIATELY: . *FEVER GREATER THAN 100.4 F (38 C) OR HIGHER . *CHILLS OR SWEATING . *NAUSEA AND VOMITING THAT IS NOT CONTROLLED WITH YOUR NAUSEA MEDICATION . *UNUSUAL SHORTNESS OF BREATH . *UNUSUAL BRUISING OR BLEEDING . *URINARY PROBLEMS (pain or burning when urinating, or frequent urination) . *BOWEL PROBLEMS (unusual diarrhea, constipation, pain near the anus) . TENDERNESS IN MOUTH AND THROAT WITH OR WITHOUT PRESENCE OF ULCERS (sore throat, sores in mouth, or a toothache) . UNUSUAL RASH, SWELLING OR PAIN  . UNUSUAL VAGINAL DISCHARGE OR ITCHING   Items with * indicate a potential emergency and should be followed up as soon as possible or go to the Emergency Department if any problems should occur.  Please show the CHEMOTHERAPY ALERT CARD or IMMUNOTHERAPY ALERT  CARD at check-in to the Emergency Department and triage nurse.  Should you have questions after your visit or need to cancel or reschedule your appointment, please contact [facility]  Dept: [phone]  and follow the prompts.  Office hours are 8:00 a.m. to 4:30 p.m. Monday - Friday. Please note that voicemails left after 4:00 p.m. may not be returned until the following business day.  We are closed weekends and major holidays. You have access to a nurse at all times for urgent questions. Please call the main number to the clinic Dept: [phone] and follow the prompts.   For any non-urgent questions, you may also contact your provider using MyChart. We now offer e-Visits for anyone 18 and older to request care online for non-urgent symptoms. For details visit [email or web address].   Also download the MyChart app! Go to the app store, search "MyChart", open the app, select [place], and log in with your MyChart username and password.  Due to Covid, a mask is required upon entering the hospital/clinic. If you do not have a mask, one will be given to you upon arrival. For doctor visits, patients may have 1 support person aged 18 or older with them. For treatment visits, patients cannot have anyone with them due to current Covid guidelines and our immunocompromised population.   

## 2021-05-04 LAB — KAPPA/LAMBDA LIGHT CHAINS
Kappa free light chain: 13.6 mg/L (ref 3.3–19.4)
Kappa, lambda light chain ratio: 1.21 (ref 0.26–1.65)
Lambda free light chains: 11.2 mg/L (ref 5.7–26.3)

## 2021-05-09 LAB — MULTIPLE MYELOMA PANEL, SERUM
Albumin SerPl Elph-Mcnc: 3.6 g/dL (ref 2.9–4.4)
Albumin/Glob SerPl: 1.4 (ref 0.7–1.7)
Alpha 1: 0.3 g/dL (ref 0.0–0.4)
Alpha2 Glob SerPl Elph-Mcnc: 0.7 g/dL (ref 0.4–1.0)
B-Globulin SerPl Elph-Mcnc: 1 g/dL (ref 0.7–1.3)
Gamma Glob SerPl Elph-Mcnc: 0.6 g/dL (ref 0.4–1.8)
Globulin, Total: 2.6 g/dL (ref 2.2–3.9)
IgA: 28 mg/dL — ABNORMAL LOW (ref 87–352)
IgG (Immunoglobin G), Serum: 727 mg/dL (ref 586–1602)
IgM (Immunoglobulin M), Srm: 27 mg/dL (ref 26–217)
Total Protein ELP: 6.2 g/dL (ref 6.0–8.5)

## 2021-05-17 ENCOUNTER — Inpatient Hospital Stay: Payer: Medicare Other

## 2021-05-17 ENCOUNTER — Inpatient Hospital Stay: Payer: Medicare Other | Attending: Hematology

## 2021-05-17 ENCOUNTER — Other Ambulatory Visit: Payer: Self-pay

## 2021-05-17 VITALS — BP 146/90 | HR 73 | Temp 98.1°F | Resp 17 | Wt 194.1 lb

## 2021-05-17 DIAGNOSIS — Z23 Encounter for immunization: Secondary | ICD-10-CM

## 2021-05-17 DIAGNOSIS — C9000 Multiple myeloma not having achieved remission: Secondary | ICD-10-CM | POA: Diagnosis not present

## 2021-05-17 DIAGNOSIS — Z5112 Encounter for antineoplastic immunotherapy: Secondary | ICD-10-CM | POA: Diagnosis not present

## 2021-05-17 DIAGNOSIS — Z95828 Presence of other vascular implants and grafts: Secondary | ICD-10-CM

## 2021-05-17 DIAGNOSIS — Z7189 Other specified counseling: Secondary | ICD-10-CM

## 2021-05-17 DIAGNOSIS — C9001 Multiple myeloma in remission: Secondary | ICD-10-CM

## 2021-05-17 LAB — CBC WITH DIFFERENTIAL/PLATELET
Abs Immature Granulocytes: 0.01 10*3/uL (ref 0.00–0.07)
Basophils Absolute: 0 10*3/uL (ref 0.0–0.1)
Basophils Relative: 1 %
Eosinophils Absolute: 0.1 10*3/uL (ref 0.0–0.5)
Eosinophils Relative: 2 %
HCT: 30.5 % — ABNORMAL LOW (ref 36.0–46.0)
Hemoglobin: 9.8 g/dL — ABNORMAL LOW (ref 12.0–15.0)
Immature Granulocytes: 0 %
Lymphocytes Relative: 43 %
Lymphs Abs: 1.7 10*3/uL (ref 0.7–4.0)
MCH: 31 pg (ref 26.0–34.0)
MCHC: 32.1 g/dL (ref 30.0–36.0)
MCV: 96.5 fL (ref 80.0–100.0)
Monocytes Absolute: 0.3 10*3/uL (ref 0.1–1.0)
Monocytes Relative: 9 %
Neutro Abs: 1.8 10*3/uL (ref 1.7–7.7)
Neutrophils Relative %: 45 %
Platelets: 183 10*3/uL (ref 150–400)
RBC: 3.16 MIL/uL — ABNORMAL LOW (ref 3.87–5.11)
RDW: 14.9 % (ref 11.5–15.5)
WBC: 3.9 10*3/uL — ABNORMAL LOW (ref 4.0–10.5)
nRBC: 0 % (ref 0.0–0.2)

## 2021-05-17 LAB — CMP (CANCER CENTER ONLY)
ALT: 24 U/L (ref 0–44)
AST: 20 U/L (ref 15–41)
Albumin: 3.7 g/dL (ref 3.5–5.0)
Alkaline Phosphatase: 65 U/L (ref 38–126)
Anion gap: 9 (ref 5–15)
BUN: 14 mg/dL (ref 8–23)
CO2: 22 mmol/L (ref 22–32)
Calcium: 9 mg/dL (ref 8.9–10.3)
Chloride: 110 mmol/L (ref 98–111)
Creatinine: 0.84 mg/dL (ref 0.44–1.00)
GFR, Estimated: >60 mL/min (ref >60)
Glucose, Bld: 80 mg/dL (ref 70–99)
Potassium: 4 mmol/L (ref 3.5–5.1)
Sodium: 141 mmol/L (ref 135–145)
Total Bilirubin: 0.7 mg/dL (ref 0.3–1.2)
Total Protein: 6.4 g/dL — ABNORMAL LOW (ref 6.5–8.1)

## 2021-05-17 MED ORDER — DEXAMETHASONE SODIUM PHOSPHATE 10 MG/ML IJ SOLN
INTRAMUSCULAR | Status: AC
  Filled 2021-05-17: qty 1

## 2021-05-17 MED ORDER — CARFILZOMIB CHEMO INJECTION 60 MG
56.0000 mg/m2 | Freq: Once | INTRAVENOUS | Status: AC
Start: 2021-05-17 — End: 2021-05-17
  Administered 2021-05-17: 120 mg via INTRAVENOUS
  Filled 2021-05-17: qty 60

## 2021-05-17 MED ORDER — ACETAMINOPHEN 500 MG PO TABS
ORAL_TABLET | ORAL | Status: AC
  Filled 2021-05-17: qty 2

## 2021-05-17 MED ORDER — SODIUM CHLORIDE 0.9% FLUSH
10.0000 mL | INTRAVENOUS | Status: DC | PRN
  Administered 2021-05-17: 10 mL
  Filled 2021-05-17: qty 10

## 2021-05-17 MED ORDER — HEPARIN SOD (PORK) LOCK FLUSH 100 UNIT/ML IV SOLN
500.0000 [IU] | Freq: Once | INTRAVENOUS | Status: AC | PRN
  Administered 2021-05-17: 500 [IU]
  Filled 2021-05-17: qty 5

## 2021-05-17 MED ORDER — PROCHLORPERAZINE MALEATE 10 MG PO TABS
ORAL_TABLET | ORAL | Status: AC
  Filled 2021-05-17: qty 1

## 2021-05-17 MED ORDER — SODIUM CHLORIDE 0.9% FLUSH
10.0000 mL | Freq: Once | INTRAVENOUS | Status: AC
  Administered 2021-05-17: 10 mL
  Filled 2021-05-17: qty 10

## 2021-05-17 MED ORDER — SODIUM CHLORIDE 0.9 % IV SOLN
Freq: Once | INTRAVENOUS | Status: AC
  Filled 2021-05-17: qty 250

## 2021-05-17 MED ORDER — PROCHLORPERAZINE MALEATE 10 MG PO TABS
10.0000 mg | ORAL_TABLET | Freq: Once | ORAL | Status: AC
  Administered 2021-05-17: 10 mg via ORAL

## 2021-05-17 MED ORDER — ACETAMINOPHEN 500 MG PO TABS
1000.0000 mg | ORAL_TABLET | Freq: Once | ORAL | Status: AC
  Administered 2021-05-17: 1000 mg via ORAL

## 2021-05-17 MED ORDER — DEXAMETHASONE SODIUM PHOSPHATE 10 MG/ML IJ SOLN
6.0000 mg | Freq: Once | INTRAMUSCULAR | Status: AC
  Administered 2021-05-17: 6 mg via INTRAVENOUS

## 2021-05-17 NOTE — Patient Instructions (Signed)
[facility]  Discharge Instructions: Thank you for choosing [facility] to provide your oncology and hematology care.   If you have a lab appointment with the Cancer Center, please go directly to the Cancer Center and check in at the registration area.   Wear comfortable clothing and clothing appropriate for easy access to any Portacath or PICC line.   We strive to give you quality time with your provider. You may need to reschedule your appointment if you arrive late (15 or more minutes).  Arriving late affects you and other patients whose appointments are after yours.  Also, if you miss three or more appointments without notifying the office, you may be dismissed from the clinic at the provider's discretion.      For prescription refill requests, have your pharmacy contact our office and allow 72 hours for refills to be completed.    Today you received the following chemotherapy and/or immunotherapy agents: Carfilzomib.      To help prevent nausea and vomiting after your treatment, we encourage you to take your nausea medication as directed.  BELOW ARE SYMPTOMS THAT SHOULD BE REPORTED IMMEDIATELY: *FEVER GREATER THAN 100.4 F (38 C) OR HIGHER *CHILLS OR SWEATING *NAUSEA AND VOMITING THAT IS NOT CONTROLLED WITH YOUR NAUSEA MEDICATION *UNUSUAL SHORTNESS OF BREATH *UNUSUAL BRUISING OR BLEEDING *URINARY PROBLEMS (pain or burning when urinating, or frequent urination) *BOWEL PROBLEMS (unusual diarrhea, constipation, pain near the anus) TENDERNESS IN MOUTH AND THROAT WITH OR WITHOUT PRESENCE OF ULCERS (sore throat, sores in mouth, or a toothache) UNUSUAL RASH, SWELLING OR PAIN  UNUSUAL VAGINAL DISCHARGE OR ITCHING   Items with * indicate a potential emergency and should be followed up as soon as possible or go to the Emergency Department if any problems should occur.  Please show the CHEMOTHERAPY ALERT CARD or IMMUNOTHERAPY ALERT CARD at check-in  to the Emergency Department and triage nurse.  Should you have questions after your visit or need to cancel or reschedule your appointment, please contact [facility]  Dept: [phone]  and follow the prompts.  Office hours are 8:00 a.m. to 4:30 p.m. Monday - Friday. Please note that voicemails left after 4:00 p.m. may not be returned until the following business day.  We are closed weekends and major holidays. You have access to a nurse at all times for urgent questions. Please call the main number to the clinic Dept: [phone] and follow the prompts.   For any non-urgent questions, you may also contact your provider using MyChart. We now offer e-Visits for anyone 18 and older to request care online for non-urgent symptoms. For details visit [email or web address].   Also download the MyChart app! Go to the app store, search "MyChart", open the app, select [facility], and log in with your MyChart username and password.  Due to Covid, a mask is required upon entering the hospital/clinic. If you do not have a mask, one will be given to you upon arrival. For doctor visits, patients may have 1 support person aged 18 or older with them. For treatment visits, patients cannot have anyone with them due to current Covid guidelines and our immunocompromised population.   

## 2021-06-13 DIAGNOSIS — E782 Mixed hyperlipidemia: Secondary | ICD-10-CM | POA: Diagnosis not present

## 2021-06-14 DIAGNOSIS — N898 Other specified noninflammatory disorders of vagina: Secondary | ICD-10-CM | POA: Diagnosis not present

## 2021-06-14 DIAGNOSIS — E782 Mixed hyperlipidemia: Secondary | ICD-10-CM | POA: Diagnosis not present

## 2021-06-14 DIAGNOSIS — D649 Anemia, unspecified: Secondary | ICD-10-CM | POA: Diagnosis not present

## 2021-06-14 DIAGNOSIS — E039 Hypothyroidism, unspecified: Secondary | ICD-10-CM | POA: Diagnosis not present

## 2021-06-14 DIAGNOSIS — R030 Elevated blood-pressure reading, without diagnosis of hypertension: Secondary | ICD-10-CM | POA: Diagnosis not present

## 2021-06-14 DIAGNOSIS — C9001 Multiple myeloma in remission: Secondary | ICD-10-CM | POA: Diagnosis not present

## 2021-06-14 DIAGNOSIS — Z96642 Presence of left artificial hip joint: Secondary | ICD-10-CM | POA: Diagnosis not present

## 2021-07-03 DIAGNOSIS — N952 Postmenopausal atrophic vaginitis: Secondary | ICD-10-CM | POA: Diagnosis not present

## 2021-07-21 ENCOUNTER — Encounter: Payer: Self-pay | Admitting: Hematology

## 2021-07-26 ENCOUNTER — Other Ambulatory Visit: Payer: Self-pay

## 2021-07-26 ENCOUNTER — Other Ambulatory Visit: Payer: Self-pay | Admitting: Hematology

## 2021-07-26 ENCOUNTER — Inpatient Hospital Stay (HOSPITAL_BASED_OUTPATIENT_CLINIC_OR_DEPARTMENT_OTHER): Payer: Medicare Other | Admitting: Hematology

## 2021-07-26 ENCOUNTER — Inpatient Hospital Stay: Payer: Medicare Other

## 2021-07-26 ENCOUNTER — Inpatient Hospital Stay: Payer: Medicare Other | Attending: Hematology

## 2021-07-26 VITALS — BP 137/83 | HR 71 | Temp 98.0°F | Resp 16 | Wt 198.0 lb

## 2021-07-26 DIAGNOSIS — C9001 Multiple myeloma in remission: Secondary | ICD-10-CM

## 2021-07-26 DIAGNOSIS — C9000 Multiple myeloma not having achieved remission: Secondary | ICD-10-CM | POA: Insufficient documentation

## 2021-07-26 DIAGNOSIS — Z23 Encounter for immunization: Secondary | ICD-10-CM

## 2021-07-26 DIAGNOSIS — Z5112 Encounter for antineoplastic immunotherapy: Secondary | ICD-10-CM | POA: Diagnosis not present

## 2021-07-26 DIAGNOSIS — Z5111 Encounter for antineoplastic chemotherapy: Secondary | ICD-10-CM

## 2021-07-26 DIAGNOSIS — Z7189 Other specified counseling: Secondary | ICD-10-CM

## 2021-07-26 DIAGNOSIS — Z95828 Presence of other vascular implants and grafts: Secondary | ICD-10-CM

## 2021-07-26 LAB — CBC WITH DIFFERENTIAL/PLATELET
Abs Immature Granulocytes: 0.02 10*3/uL (ref 0.00–0.07)
Basophils Absolute: 0 10*3/uL (ref 0.0–0.1)
Basophils Relative: 1 %
Eosinophils Absolute: 0.1 10*3/uL (ref 0.0–0.5)
Eosinophils Relative: 2 %
HCT: 32.3 % — ABNORMAL LOW (ref 36.0–46.0)
Hemoglobin: 10.5 g/dL — ABNORMAL LOW (ref 12.0–15.0)
Immature Granulocytes: 0 %
Lymphocytes Relative: 45 %
Lymphs Abs: 2.1 10*3/uL (ref 0.7–4.0)
MCH: 29.6 pg (ref 26.0–34.0)
MCHC: 32.5 g/dL (ref 30.0–36.0)
MCV: 91 fL (ref 80.0–100.0)
Monocytes Absolute: 0.4 10*3/uL (ref 0.1–1.0)
Monocytes Relative: 9 %
Neutro Abs: 2 10*3/uL (ref 1.7–7.7)
Neutrophils Relative %: 43 %
Platelets: 185 10*3/uL (ref 150–400)
RBC: 3.55 MIL/uL — ABNORMAL LOW (ref 3.87–5.11)
RDW: 13.6 % (ref 11.5–15.5)
WBC: 4.6 10*3/uL (ref 4.0–10.5)
nRBC: 0 % (ref 0.0–0.2)

## 2021-07-26 LAB — CMP (CANCER CENTER ONLY)
ALT: 22 U/L (ref 0–44)
AST: 24 U/L (ref 15–41)
Albumin: 3.8 g/dL (ref 3.5–5.0)
Alkaline Phosphatase: 51 U/L (ref 38–126)
Anion gap: 8 (ref 5–15)
BUN: 16 mg/dL (ref 8–23)
CO2: 23 mmol/L (ref 22–32)
Calcium: 9.3 mg/dL (ref 8.9–10.3)
Chloride: 112 mmol/L — ABNORMAL HIGH (ref 98–111)
Creatinine: 0.88 mg/dL (ref 0.44–1.00)
GFR, Estimated: >60 mL/min (ref >60)
Glucose, Bld: 105 mg/dL — ABNORMAL HIGH (ref 70–99)
Potassium: 4.1 mmol/L (ref 3.5–5.1)
Sodium: 143 mmol/L (ref 135–145)
Total Bilirubin: 0.7 mg/dL (ref 0.3–1.2)
Total Protein: 6.9 g/dL (ref 6.5–8.1)

## 2021-07-26 MED ORDER — DEXTROSE 5 % IV SOLN
56.0000 mg/m2 | Freq: Once | INTRAVENOUS | Status: AC
  Administered 2021-07-26: 120 mg via INTRAVENOUS
  Filled 2021-07-26: qty 60

## 2021-07-26 MED ORDER — SODIUM CHLORIDE 0.9 % IV SOLN: Freq: Once | INTRAVENOUS | Status: AC

## 2021-07-26 MED ORDER — ACETAMINOPHEN 500 MG PO TABS
1000.0000 mg | ORAL_TABLET | Freq: Once | ORAL | Status: AC
  Administered 2021-07-26: 1000 mg via ORAL
  Filled 2021-07-26: qty 2

## 2021-07-26 MED ORDER — HEPARIN SOD (PORK) LOCK FLUSH 100 UNIT/ML IV SOLN
500.0000 [IU] | Freq: Once | INTRAVENOUS | Status: AC | PRN
  Administered 2021-07-26: 500 [IU]

## 2021-07-26 MED ORDER — SODIUM CHLORIDE 0.9% FLUSH
10.0000 mL | INTRAVENOUS | Status: DC | PRN
  Administered 2021-07-26: 10 mL

## 2021-07-26 MED ORDER — PROCHLORPERAZINE MALEATE 10 MG PO TABS
10.0000 mg | ORAL_TABLET | Freq: Once | ORAL | Status: AC
  Administered 2021-07-26: 10 mg via ORAL
  Filled 2021-07-26: qty 1

## 2021-07-26 MED ORDER — ZOLEDRONIC ACID 4 MG/100ML IV SOLN
4.0000 mg | Freq: Once | INTRAVENOUS | Status: AC
  Administered 2021-07-26: 4 mg via INTRAVENOUS
  Filled 2021-07-26: qty 100

## 2021-07-26 MED ORDER — DEXAMETHASONE SODIUM PHOSPHATE 10 MG/ML IJ SOLN
6.0000 mg | Freq: Once | INTRAMUSCULAR | Status: AC
  Administered 2021-07-26: 6 mg via INTRAVENOUS
  Filled 2021-07-26: qty 1

## 2021-07-26 NOTE — Patient Instructions (Signed)
[facility]  Discharge Instructions: Thank you for choosing [facility] to provide your oncology and hematology care.   If you have a lab appointment with the Cancer Center, please go directly to the Cancer Center and check in at the registration area.   Wear comfortable clothing and clothing appropriate for easy access to any Portacath or PICC line.   We strive to give you quality time with your provider. You may need to reschedule your appointment if you arrive late (15 or more minutes).  Arriving late affects you and other patients whose appointments are after yours.  Also, if you miss three or more appointments without notifying the office, you may be dismissed from the clinic at the provider's discretion.      For prescription refill requests, have your pharmacy contact our office and allow 72 hours for refills to be completed.    Today you received the following chemotherapy and/or immunotherapy agents carfilzomib   To help prevent nausea and vomiting after your treatment, we encourage you to take your nausea medication as directed.  BELOW ARE SYMPTOMS THAT SHOULD BE REPORTED IMMEDIATELY: . *FEVER GREATER THAN 100.4 F (38 C) OR HIGHER . *CHILLS OR SWEATING . *NAUSEA AND VOMITING THAT IS NOT CONTROLLED WITH YOUR NAUSEA MEDICATION . *UNUSUAL SHORTNESS OF BREATH . *UNUSUAL BRUISING OR BLEEDING . *URINARY PROBLEMS (pain or burning when urinating, or frequent urination) . *BOWEL PROBLEMS (unusual diarrhea, constipation, pain near the anus) . TENDERNESS IN MOUTH AND THROAT WITH OR WITHOUT PRESENCE OF ULCERS (sore throat, sores in mouth, or a toothache) . UNUSUAL RASH, SWELLING OR PAIN  . UNUSUAL VAGINAL DISCHARGE OR ITCHING   Items with * indicate a potential emergency and should be followed up as soon as possible or go to the Emergency Department if any problems should occur.  Please show the CHEMOTHERAPY ALERT CARD or IMMUNOTHERAPY ALERT  CARD at check-in to the Emergency Department and triage nurse.  Should you have questions after your visit or need to cancel or reschedule your appointment, please contact [facility]  Dept: [phone]  and follow the prompts.  Office hours are 8:00 a.m. to 4:30 p.m. Monday - Friday. Please note that voicemails left after 4:00 p.m. may not be returned until the following business day.  We are closed weekends and major holidays. You have access to a nurse at all times for urgent questions. Please call the main number to the clinic Dept: [phone] and follow the prompts.   For any non-urgent questions, you may also contact your provider using MyChart. We now offer e-Visits for anyone 18 and older to request care online for non-urgent symptoms. For details visit [email or web address].   Also download the MyChart app! Go to the app store, search "MyChart", open the app, select , and log in with your MyChart username and password.  Due to Covid, a mask is required upon entering the hospital/clinic. If you do not have a mask, one will be given to you upon arrival. For doctor visits, patients may have 1 support person aged 18 or older with them. For treatment visits, patients cannot have anyone with them due to current Covid guidelines and our immunocompromised population.   

## 2021-07-27 ENCOUNTER — Telehealth: Payer: Self-pay | Admitting: Hematology

## 2021-07-27 NOTE — Telephone Encounter (Signed)
Scheduled follow-up appointments per 10/12 los. Patient is aware. 

## 2021-08-02 ENCOUNTER — Encounter: Payer: Self-pay | Admitting: Hematology

## 2021-08-02 NOTE — Progress Notes (Signed)
HEMATOLOGY/ONCOLOGY CLINIC NOTE  Date of Service: .07/26/2021    PCP: Marzetta Board MD  CHIEF COMPLAINTS/PURPOSE OF CONSULTATION:  Continued f/u for mx of myeloma   HISTORY OF PRESENTING ILLNESS:   Madison Parker is a wonderful 70 y.o. 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  Patient has a history of smoldering multiple myeloma which she reports she was diagnosed with in 2011 when she was evaluated by a hematologist in [place]. She reports that she presented with low blood counts (low WBC)   and after significant workup she had a bone marrow examination based on which she was told that she has smoldering multiple myeloma. Patient notes that she had a bone survey at that time which was negative. She was monitored there closely and then moved to [facility] in 2015 to stay with her son whose wife was being treated for breast cancer. Patient was following with Dr. Delight Hoh in [place]. She reports not having had any treatment for multiple myeloma.  Her last labs from about a year ago showed a SPEP with no OBSERVED M spike . Serum free light chains showed an elevation of kappa Free light chain 310 and lambda free light chain of 12.7 with an abnormal Kappa/ Lambda ratio of 24.38 ( up from 19 previously). Random urine showed an M protein complement of 44%.  She lives in [place] and requested transfer of care to [place]. She was offered follow-up but [facility] in [place]  But prefers to  follow [place] in [place]. Patient reports her energy levels been stable. She has chronic back pain which has improved since the patient had her spinal surgery in April 2017 (L4-L5 interbody fusion). Patient notes she has had some chronic left hip pain which is somewhat more bothersome with the navy on the lateral aspect of her upper thighs that's painful. She also notes some right hip  pain. Over the last few weeks he notes some upper neck pain especially when bending her neck backwards and tingling numbness in her right upper extremity.  Has been working on losing some weight voluntarily.  No acute other new symptoms.  AUTOLOGOUS STEM CELL TRANSPLANT:  Disease status at time of transplant: sCR MRD positive  ASBMT risk stratification: low risk, ECOG/KPS: 0 / 90%, HSCT-CI score: 0  Started mobilization on 09/03/19 using GCS-F and Plerixafor. She collected stem cells on 09/07/19. The pre-processing total was 7.18x10(6) CD34+ cells/kg. The post-processing total was 1.11x10(7) CD34+ cells/kg frozen in 3 bags.  Preparative regimen with Melphalan 200 mg/m2 on 09/14/19 (outpatient).  Infusion of stem cells - 7.4 x 10(6) on 09/15/19 (outpatient)  Admitted Day +8 for neutropenic fever, uncontrolled n/v, diarrhea  Treated with short course of prednisone for peri-engraftment syndrome  TREATMENT:  Started induction therapy with carfilozomib, lenlaidomide, and dexamethasone 28 day cycle on 04/27/2019. She had a great response to initial cycle. She developed a DVT on 06/24/19 and was started on apixaban as an anticoagulant. She had normalization of her light chains by the start of C4 on 07/28/19.  She was referred to the myeloma clinic at [facility] for BMT consult and seen on 08/10/19. Serology from her visit showed no M-spike by SPEP, a free light chain ratio of 1.07 (kappa 11.42, lambda 10.65), and an abnormal beta-2-micro of 3.02. She was started on pre-transplant evaluation while undergoing C5 of therapy.  Treatment discontinued in 07/2019 in preparation  for autologous stem cell transplant.   INTERVAL HISTORY:   Madison Parker returns today for f/u of her multiple myeloma. She is s/p HDT -Auto HSCT . She is here for continued Maintenance Carfilzomib. Patient reports no rashes.  No other toxicities from Lab results today reviewed with the patient .labs are stable    MEDICAL  HISTORY:  Past Medical History:  Diagnosis Date   Headache    Low back pain    SBO (small bowel obstruction) ([place]) 2010   Smoldering multiple myeloma ([place])   Previous history of hypothyroidism- not currently medications Anxiety Obesity .There is no height or weight on file to calculate BMI. Vitamin D deficiency Smoldering multiple myeloma diagnosed in 2011 Lumbosacral radiculopathy Left hip pain  SURGICAL HISTORY: Past Surgical History:  Procedure Laterality Date   ABDOMINAL HYSTERECTOMY     complete   BACK SURGERY     lower at [facility]   COLONOSCOPY WITH PROPOFOL N/A 08/23/2020   Procedure: COLONOSCOPY WITH PROPOFOL;  Surgeon: Harvel Quale, MD;  Location: [facility];  Service: Gastroenterology;  Laterality: N/A;  900   colonscopy  2014   GASTRIC BYPASS  yrs ago   HERNIA REPAIR     IR IMAGING GUIDED PORT INSERTION  04/20/2019   KNEE SURGERY  06/04/2009   both knees replaced    POLYPECTOMY  08/23/2020   Procedure: POLYPECTOMY;  Surgeon: Harvel Quale, MD;  Location: [facility];  Service: Gastroenterology;;   TONSILLECTOMY  age 76   TOTAL HIP ARTHROPLASTY Left 08/16/2017   Procedure: LEFT TOTAL HIP ARTHROPLASTY ANTERIOR APPROACH;  Surgeon: Dorna Leitz, MD;  Location: [facility];  Service: Orthopedics;  Laterality: Left;  Spinal surgery April 2017  SOCIAL HISTORY: Social History   Socioeconomic History   Marital status: Married    Spouse name: Not on file   Number of children: Not on file   Years of education: Not on file   Highest education level: Not on file  Occupational History   Not on file  Tobacco Use   Smoking status: Former    Packs/day: 0.50    Years: 10.00    Pack years: 5.00    Types: Cigarettes   Smokeless tobacco: Never   Tobacco comments:    quit 1977  Vaping Use   Vaping Use: Never used  Substance and Sexual Activity   Alcohol use: Yes    Comment: occ   Drug use: No   Sexual activity: Not on file  Other Topics Concern    Not on file  Social History Narrative   Not on file   Social Determinants of Health   Financial Resource Strain: Not on file  Food Insecurity: Not on file  Transportation Needs: Not on file  Physical Activity: Not on file  Stress: Not on file  Social Connections: Not on file  Intimate Partner Violence: Not At Risk   Fear of Current or Ex-Partner: No   Emotionally Abused: No   Physically Abused: No   Sexually Abused: No  Occasional alcohol use Former smoker and smoked 1 pack per week for about 9 years has since quit.  FAMILY HISTORY: No family history on file.  ALLERGIES:  is allergic to codeine.  MEDICATIONS:  Current Outpatient Medications  Medication Sig Dispense Refill   acyclovir (ZOVIRAX) 800 MG tablet      Calcium Carbonate-Vitamin D 600-200 MG-UNIT TABS Calcium 600 with Vitamin D3 600 mg-5 mcg (200 unit) tablet  Take 1 tablet every day by oral  route as directed.     FOLIC ACID PO Take 0,623 mg by mouth daily.      lidocaine-prilocaine (EMLA) cream Apply 1 application topically as needed. 30 g 2   NYSTATIN powder Apply topically 2 (two) times daily. 15 g 1   nystatin-triamcinolone (MYCOLOG II) cream Apply topically 2 (two) times daily. 30 g 1   Probiotic Product (PROBIOTIC DAILY PO) Take 2 capsules by mouth daily. Gummies     vitamin B-12 (CYANOCOBALAMIN) 1000 MCG tablet Take 1,000 mcg by mouth daily.      Vitamin D, Ergocalciferol, (DRISDOL) 1.25 MG (50000 UNIT) CAPS capsule TAKE 1 CAPSULE ONCE A WEEK (Patient taking differently: Take 50,000 Units by mouth every 7 (seven) days. Sunday) 13 capsule 3   No current facility-administered medications for this visit.   Facility-Administered Medications Ordered in Other Visits  Medication Dose Route Frequency Provider Last Rate Last Admin   sodium chloride flush (NS) 0.9 % injection 10 mL  10 mL Intracatheter PRN Brunetta Genera, MD   10 mL at 05/11/19 1437    REVIEW OF SYSTEMS:  .10 Point review of Systems was  done is negative except as noted above.   PHYSICAL EXAMINATION: ECOG FS:1 - Symptomatic but completely ambulatory  There were no vitals filed for this visit.  Wt Readings from Last 3 Encounters:  07/26/21 198 lb (89.8 kg)  05/17/21 194 lb 1.9 oz (88.1 kg)  05/03/21 195 lb 3.2 oz (88.5 kg)   There is no height or weight on file to calculate BMI.    Marland Kitchen GENERAL:alert, in no acute distress and comfortable SKIN: no acute rashes, no significant lesions EYES: conjunctiva are pink and non-injected, sclera anicteric OROPHARYNX: MMM, no exudates, no oropharyngeal erythema or ulceration NECK: supple, no JVD LYMPH:  no palpable lymphadenopathy in the cervical, axillary or inguinal regions LUNGS: clear to auscultation b/l with normal respiratory effort HEART: regular rate & rhythm ABDOMEN:  normoactive bowel sounds , non tender, not distended. Extremity: no pedal edema PSYCH: alert & oriented x 3 with fluent speech NEURO: no focal motor/sensory deficits   LABORATORY DATA:  I have reviewed the data as listed  . CBC Latest Ref Rng & Units 07/26/2021 05/17/2021 05/03/2021  WBC 4.0 - 10.5 K/uL 4.6 3.9(L) 5.2  Hemoglobin 12.0 - 15.0 g/dL 10.5(L) 9.8(L) 9.2(L)  Hematocrit 36.0 - 46.0 % 32.3(L) 30.5(L) 29.0(L)  Platelets 150 - 400 K/uL 185 183 283  ANC 1300 . CBC    Component Value Date/Time   WBC 4.6 07/26/2021 0819   RBC 3.55 (L) 07/26/2021 0819   HGB 10.5 (L) 07/26/2021 0819   HGB 10.2 (L) 04/05/2021 1328   HGB 11.3 (L) 09/30/2017 1145   HCT 32.3 (L) 07/26/2021 0819   HCT 35.1 09/30/2017 1145   PLT 185 07/26/2021 0819   PLT 161 04/05/2021 1328   PLT 172 09/30/2017 1145   MCV 91.0 07/26/2021 0819   MCV 97.5 09/30/2017 1145   MCH 29.6 07/26/2021 0819   MCHC 32.5 07/26/2021 0819   RDW 13.6 07/26/2021 0819   RDW 14.0 09/30/2017 1145   LYMPHSABS 2.1 07/26/2021 0819   LYMPHSABS 1.3 09/30/2017 1145   MONOABS 0.4 07/26/2021 0819   MONOABS 0.2 09/30/2017 1145   EOSABS 0.1 07/26/2021  0819   EOSABS 0.0 09/30/2017 1145   EOSABS 0.1 06/03/2014 1003   BASOSABS 0.0 07/26/2021 0819   BASOSABS 0.0 09/30/2017 1145     . CMP Latest Ref Rng & Units 07/26/2021 05/17/2021 05/03/2021  Glucose  70 - 99 mg/dL 105(H) 80 86  BUN 8 - 23 mg/dL 16 14 14   Creatinine 0.44 - 1.00 mg/dL 0.88 0.84 0.82  Sodium 135 - 145 mmol/L 143 141 143  Potassium 3.5 - 5.1 mmol/L 4.1 4.0 3.7  Chloride 98 - 111 mmol/L 112(H) 110 110  CO2 22 - 32 mmol/L 23 22 23   Calcium 8.9 - 10.3 mg/dL 9.3 9.0 9.1  Total Protein 6.5 - 8.1 g/dL 6.9 6.4(L) 7.3  Total Bilirubin 0.3 - 1.2 mg/dL 0.7 0.7 1.1  Alkaline Phos 38 - 126 U/L 51 65 81  AST 15 - 41 U/L 24 20 26   ALT 0 - 44 U/L 22 24 31     06/08/2020 K/L light chains:         Component     Latest Ref Rng & Units 11/08/2016  LDH     125 - 245 U/L 220  Beta 2     0.6 - 2.4 mg/L 2.5 (H)   Component     Latest Ref Rng & Units 07/30/2017  Ferritin     9 - 269 ng/ml 81  Vitamin B12     232 - 1,245 pg/mL 594   Magnesium: Component Magnesium  Latest Ref Rng & Units 1.7 - 2.4 mg/dL  10/02/2019 1.7  10/21/2019 1.7  11/12/2019 1.7    03/11/19 BM Bx:   03/11/19 Cytogenetics:            RADIOGRAPHIC STUDIES: I have personally reviewed the radiological images as listed and agreed with the findings in the report. No results found.   Bone survey 11/08/2016 IMPRESSION: 1. Small lytic lesion in the right scapula. 2. Possible small lytic lesion in the left scapula. 3. Postoperative changes. 4. Significant degenerative changes in both hips, left greater than right. 5. No evidence for acute fracture     Electronically Signed   By: Nolon Nations M.D.   On: 11/08/2016 16:19   ASSESSMENT & PLAN:   70 y.o. very pleasant lady with history of   1) Multiple myeloma (concern for small lytic lesions in left and right scapulae) - (appears light chain producing) and Progressive anemia This was apparently diagnosed as smoldering myeloma in 2011  by her hematologist in [place]. No renal failure/hypercalcemia. Bone survey with concern for possible small lytic lesions in B/L scapulae. PET/CT scan on 03/12/2017 showed no concerning bone lesions. Initial Bm Bx with 17% kappa restricted plasma cells consistent with plasma cell neoplasm. No treatment so far. 03/11/19 BM Bx revealed involvement by 50% plasma cells; genetics -high risk t(14;16), previous genetics from April 2018 BM Bx were standard risk 03/16/19 MRI Lumbar reveals several explanations for her lower back pain including L2-L3 stenosis, but reassuringly, there was not evidence of involvement by multiple myeloma 04/01/19 PET/CT which revealed no FDG evidence of active multiple myeloma within the skeleton. No evidence of lytic lesions within the skeleton or soft tissue Plasmacytoma. 05/07/2019 DXA scan revealed osteopenia, T-score of -1.2.  2) Chronic leukopenia - ? Related to SMM vs other nutritional deficiencies (h/o gastric bypass surgery put her at risk for nutritional deficiencies).  WBC counts of have been close to normal recently today at 3.8k with an [place] of 1600. B12 levels WNL Ferritin adequate ?additional factor -recent NSAID use. ?element of benign ethnic neutropenia. Has not had any issues with frequent infection.    4) Neuropathy/radiculopathy -Continue follow up with orthopedics  5) Superficial Venous Thrombosis of the left small saphenous vein  06/24/2019 US venous  left : Right: There is no evidence of deep vein thrombosis in the lower extremity. No cystic structure found in the popliteal fossa. Left: Findings consistent with acute superficial vein thrombosis involving the left small saphenous vein. There is no evidence of deep vein thrombosis in the lower extremity. However, portions of this examination were limited- see technologist comments above. No cystic structure found in the popliteal fossa PLAN: -Discussed pt labwork today, 07/26/2021; blood counts  stable, cmp stable. - reduced the Dexamethasone dosage again to 6 mg with treatment -The pt has no prohibitive toxicities from continuing maintenance Carfilzomib  at this time. Will continue Carflizomib at 56 mg/m.  -Continue Zometa q12 weeks  -Will see back in 6 weeks with labs.   FOLLOW UP:  -Please schedule next 4 cycles [8 doses] of Carfilzomib with port flush and labs -Continue Zometa every 3 months x 4 doses -Return to clinic with Dr. Irene Limbo in 6 weeks   . The total time spent in the appointment was 30 minutes and more than 50% was on counseling and direct patient cares, ordering and mx of chemotherapy   All of the patient's questions were answered with apparent satisfaction. The patient knows to call the clinic with any problems, questions or concerns.    Sullivan Lone MD [place] AAHIVMS [facility] Hematology/Oncology Physician [facility]

## 2021-08-09 ENCOUNTER — Inpatient Hospital Stay: Payer: Medicare Other

## 2021-08-09 ENCOUNTER — Other Ambulatory Visit: Payer: Self-pay

## 2021-08-09 ENCOUNTER — Ambulatory Visit: Payer: TRICARE For Life (TFL)

## 2021-08-09 ENCOUNTER — Other Ambulatory Visit: Payer: TRICARE For Life (TFL)

## 2021-08-09 VITALS — BP 130/82 | HR 65 | Temp 98.6°F | Resp 16 | Wt 200.4 lb

## 2021-08-09 DIAGNOSIS — C9001 Multiple myeloma in remission: Secondary | ICD-10-CM

## 2021-08-09 DIAGNOSIS — Z95828 Presence of other vascular implants and grafts: Secondary | ICD-10-CM

## 2021-08-09 DIAGNOSIS — Z23 Encounter for immunization: Secondary | ICD-10-CM

## 2021-08-09 DIAGNOSIS — Z7189 Other specified counseling: Secondary | ICD-10-CM

## 2021-08-09 DIAGNOSIS — Z5112 Encounter for antineoplastic immunotherapy: Secondary | ICD-10-CM | POA: Diagnosis not present

## 2021-08-09 DIAGNOSIS — C9000 Multiple myeloma not having achieved remission: Secondary | ICD-10-CM

## 2021-08-09 LAB — CBC WITH DIFFERENTIAL/PLATELET
Abs Immature Granulocytes: 0.01 10*3/uL (ref 0.00–0.07)
Basophils Absolute: 0 10*3/uL (ref 0.0–0.1)
Basophils Relative: 1 %
Eosinophils Absolute: 0.1 10*3/uL (ref 0.0–0.5)
Eosinophils Relative: 2 %
HCT: 31.5 % — ABNORMAL LOW (ref 36.0–46.0)
Hemoglobin: 10.2 g/dL — ABNORMAL LOW (ref 12.0–15.0)
Immature Granulocytes: 0 %
Lymphocytes Relative: 45 %
Lymphs Abs: 1.7 10*3/uL (ref 0.7–4.0)
MCH: 30 pg (ref 26.0–34.0)
MCHC: 32.4 g/dL (ref 30.0–36.0)
MCV: 92.6 fL (ref 80.0–100.0)
Monocytes Absolute: 0.3 10*3/uL (ref 0.1–1.0)
Monocytes Relative: 9 %
Neutro Abs: 1.6 10*3/uL — ABNORMAL LOW (ref 1.7–7.7)
Neutrophils Relative %: 43 %
Platelets: 213 10*3/uL (ref 150–400)
RBC: 3.4 MIL/uL — ABNORMAL LOW (ref 3.87–5.11)
RDW: 14.3 % (ref 11.5–15.5)
WBC: 3.6 10*3/uL — ABNORMAL LOW (ref 4.0–10.5)
nRBC: 0 % (ref 0.0–0.2)

## 2021-08-09 LAB — CMP (CANCER CENTER ONLY)
ALT: 23 U/L (ref 0–44)
AST: 20 U/L (ref 15–41)
Albumin: 3.6 g/dL (ref 3.5–5.0)
Alkaline Phosphatase: 70 U/L (ref 38–126)
Anion gap: 8 (ref 5–15)
BUN: 10 mg/dL (ref 8–23)
CO2: 20 mmol/L — ABNORMAL LOW (ref 22–32)
Calcium: 8.4 mg/dL — ABNORMAL LOW (ref 8.9–10.3)
Chloride: 113 mmol/L — ABNORMAL HIGH (ref 98–111)
Creatinine: 0.81 mg/dL (ref 0.44–1.00)
GFR, Estimated: >60 mL/min (ref >60)
Glucose, Bld: 96 mg/dL (ref 70–99)
Potassium: 3.7 mmol/L (ref 3.5–5.1)
Sodium: 141 mmol/L (ref 135–145)
Total Bilirubin: 0.8 mg/dL (ref 0.3–1.2)
Total Protein: 6.6 g/dL (ref 6.5–8.1)

## 2021-08-09 MED ORDER — PROCHLORPERAZINE MALEATE 10 MG PO TABS
10.0000 mg | ORAL_TABLET | Freq: Once | ORAL | Status: AC
  Administered 2021-08-09: 10 mg via ORAL
  Filled 2021-08-09: qty 1

## 2021-08-09 MED ORDER — ACETAMINOPHEN 500 MG PO TABS
1000.0000 mg | ORAL_TABLET | Freq: Once | ORAL | Status: AC
  Administered 2021-08-09: 1000 mg via ORAL
  Filled 2021-08-09: qty 2

## 2021-08-09 MED ORDER — DEXTROSE 5 % IV SOLN: 56.0000 mg/m2 | Freq: Once | INTRAVENOUS | Status: DC

## 2021-08-09 MED ORDER — HEPARIN SOD (PORK) LOCK FLUSH 100 UNIT/ML IV SOLN
500.0000 [IU] | Freq: Once | INTRAVENOUS | Status: AC | PRN
  Administered 2021-08-09: 500 [IU]

## 2021-08-09 MED ORDER — SODIUM CHLORIDE 0.9% FLUSH
10.0000 mL | INTRAVENOUS | Status: DC | PRN
  Administered 2021-08-09: 10 mL

## 2021-08-09 MED ORDER — SODIUM CHLORIDE 0.9 % IV SOLN
6.0000 mg | Freq: Once | INTRAVENOUS | Status: AC
  Administered 2021-08-09: 6 mg via INTRAVENOUS
  Filled 2021-08-09: qty 0.6

## 2021-08-09 MED ORDER — SODIUM CHLORIDE 0.9% FLUSH
10.0000 mL | Freq: Once | INTRAVENOUS | Status: AC
  Administered 2021-08-09: 10 mL

## 2021-08-09 MED ORDER — SODIUM CHLORIDE 0.9 % IV SOLN: Freq: Once | INTRAVENOUS | Status: AC

## 2021-08-09 MED ORDER — SODIUM CHLORIDE 0.9 % IV SOLN: Freq: Once | INTRAVENOUS | Status: DC

## 2021-08-09 MED ORDER — DEXTROSE 5 % IV SOLN
56.0000 mg/m2 | Freq: Once | INTRAVENOUS | Status: AC
  Administered 2021-08-09: 120 mg via INTRAVENOUS
  Filled 2021-08-09: qty 60

## 2021-08-09 MED ORDER — DEXAMETHASONE SODIUM PHOSPHATE 10 MG/ML IJ SOLN: 6.0000 mg | Freq: Once | INTRAMUSCULAR | Status: DC

## 2021-08-09 NOTE — Patient Instructions (Signed)
[facility] MEDICAL ONCOLOGY  Discharge Instructions: Thank you for choosing [facility] to provide your oncology and hematology care.   If you have a lab appointment with the Cancer Center, please go directly to the Cancer Center and check in at the registration area.   Wear comfortable clothing and clothing appropriate for easy access to any Portacath or PICC line.   We strive to give you quality time with your provider. You may need to reschedule your appointment if you arrive late (15 or more minutes).  Arriving late affects you and other patients whose appointments are after yours.  Also, if you miss three or more appointments without notifying the office, you may be dismissed from the clinic at the provider's discretion.      For prescription refill requests, have your pharmacy contact our office and allow 72 hours for refills to be completed.    Today you received the following chemotherapy and/or immunotherapy agents carfilzomib   To help prevent nausea and vomiting after your treatment, we encourage you to take your nausea medication as directed.  BELOW ARE SYMPTOMS THAT SHOULD BE REPORTED IMMEDIATELY: . *FEVER GREATER THAN 100.4 F (38 C) OR HIGHER . *CHILLS OR SWEATING . *NAUSEA AND VOMITING THAT IS NOT CONTROLLED WITH YOUR NAUSEA MEDICATION . *UNUSUAL SHORTNESS OF BREATH . *UNUSUAL BRUISING OR BLEEDING . *URINARY PROBLEMS (pain or burning when urinating, or frequent urination) . *BOWEL PROBLEMS (unusual diarrhea, constipation, pain near the anus) . TENDERNESS IN MOUTH AND THROAT WITH OR WITHOUT PRESENCE OF ULCERS (sore throat, sores in mouth, or a toothache) . UNUSUAL RASH, SWELLING OR PAIN  . UNUSUAL VAGINAL DISCHARGE OR ITCHING   Items with * indicate a potential emergency and should be followed up as soon as possible or go to the Emergency Department if any problems should occur.  Please show the CHEMOTHERAPY ALERT CARD or IMMUNOTHERAPY ALERT  CARD at check-in to the Emergency Department and triage nurse.  Should you have questions after your visit or need to cancel or reschedule your appointment, please contact [facility] MEDICAL ONCOLOGY  Dept: [phone]  and follow the prompts.  Office hours are 8:00 a.m. to 4:30 p.m. Monday - Friday. Please note that voicemails left after 4:00 p.m. may not be returned until the following business day.  We are closed weekends and major holidays. You have access to a nurse at all times for urgent questions. Please call the main number to the clinic Dept: [phone] and follow the prompts.   For any non-urgent questions, you may also contact your provider using MyChart. We now offer e-Visits for anyone 18 and older to request care online for non-urgent symptoms. For details visit [email or web address].   Also download the MyChart app! Go to the app store, search "MyChart", open the app, select [place], and log in with your MyChart username and password.  Due to Covid, a mask is required upon entering the hospital/clinic. If you do not have a mask, one will be given to you upon arrival. For doctor visits, patients may have 1 support person aged 18 or older with them. For treatment visits, patients cannot have anyone with them due to current Covid guidelines and our immunocompromised population.   

## 2021-08-10 LAB — KAPPA/LAMBDA LIGHT CHAINS
Kappa free light chain: 10.5 mg/L (ref 3.3–19.4)
Kappa, lambda light chain ratio: 1 (ref 0.26–1.65)
Lambda free light chains: 10.5 mg/L (ref 5.7–26.3)

## 2021-08-14 LAB — MULTIPLE MYELOMA PANEL, SERUM
Albumin SerPl Elph-Mcnc: 3.5 g/dL (ref 2.9–4.4)
Albumin/Glob SerPl: 1.3 (ref 0.7–1.7)
Alpha 1: 0.2 g/dL (ref 0.0–0.4)
Alpha2 Glob SerPl Elph-Mcnc: 0.6 g/dL (ref 0.4–1.0)
B-Globulin SerPl Elph-Mcnc: 1 g/dL (ref 0.7–1.3)
Gamma Glob SerPl Elph-Mcnc: 0.8 g/dL (ref 0.4–1.8)
Globulin, Total: 2.7 g/dL (ref 2.2–3.9)
IgA: 35 mg/dL — ABNORMAL LOW (ref 87–352)
IgG (Immunoglobin G), Serum: 911 mg/dL (ref 586–1602)
IgM (Immunoglobulin M), Srm: 28 mg/dL (ref 26–217)
Total Protein ELP: 6.2 g/dL (ref 6.0–8.5)

## 2021-08-23 ENCOUNTER — Other Ambulatory Visit: Payer: TRICARE For Life (TFL)

## 2021-08-23 ENCOUNTER — Inpatient Hospital Stay: Payer: Medicare Other | Attending: Hematology

## 2021-08-23 ENCOUNTER — Other Ambulatory Visit: Payer: Self-pay | Admitting: Hematology

## 2021-08-23 ENCOUNTER — Ambulatory Visit: Payer: TRICARE For Life (TFL)

## 2021-08-23 ENCOUNTER — Other Ambulatory Visit: Payer: Self-pay

## 2021-08-23 ENCOUNTER — Inpatient Hospital Stay: Payer: Medicare Other

## 2021-08-23 VITALS — BP 146/84 | HR 58 | Temp 98.2°F | Resp 14 | Wt 194.0 lb

## 2021-08-23 DIAGNOSIS — Z5112 Encounter for antineoplastic immunotherapy: Secondary | ICD-10-CM | POA: Insufficient documentation

## 2021-08-23 DIAGNOSIS — Z7189 Other specified counseling: Secondary | ICD-10-CM

## 2021-08-23 DIAGNOSIS — C9000 Multiple myeloma not having achieved remission: Secondary | ICD-10-CM | POA: Diagnosis not present

## 2021-08-23 DIAGNOSIS — Z95828 Presence of other vascular implants and grafts: Secondary | ICD-10-CM

## 2021-08-23 DIAGNOSIS — C9001 Multiple myeloma in remission: Secondary | ICD-10-CM

## 2021-08-23 DIAGNOSIS — Z23 Encounter for immunization: Secondary | ICD-10-CM

## 2021-08-23 LAB — CMP (CANCER CENTER ONLY)
ALT: 37 U/L (ref 0–44)
AST: 25 U/L (ref 15–41)
Albumin: 3.8 g/dL (ref 3.5–5.0)
Alkaline Phosphatase: 57 U/L (ref 38–126)
Anion gap: 8 (ref 5–15)
BUN: 12 mg/dL (ref 8–23)
CO2: 21 mmol/L — ABNORMAL LOW (ref 22–32)
Calcium: 8.8 mg/dL — ABNORMAL LOW (ref 8.9–10.3)
Chloride: 113 mmol/L — ABNORMAL HIGH (ref 98–111)
Creatinine: 0.75 mg/dL (ref 0.44–1.00)
GFR, Estimated: >60 mL/min (ref >60)
Glucose, Bld: 85 mg/dL (ref 70–99)
Potassium: 3.6 mmol/L (ref 3.5–5.1)
Sodium: 142 mmol/L (ref 135–145)
Total Bilirubin: 0.9 mg/dL (ref 0.3–1.2)
Total Protein: 6.6 g/dL (ref 6.5–8.1)

## 2021-08-23 LAB — CBC WITH DIFFERENTIAL/PLATELET
Abs Immature Granulocytes: 0.02 10*3/uL (ref 0.00–0.07)
Basophils Absolute: 0 10*3/uL (ref 0.0–0.1)
Basophils Relative: 1 %
Eosinophils Absolute: 0.1 10*3/uL (ref 0.0–0.5)
Eosinophils Relative: 2 %
HCT: 31.9 % — ABNORMAL LOW (ref 36.0–46.0)
Hemoglobin: 10.2 g/dL — ABNORMAL LOW (ref 12.0–15.0)
Immature Granulocytes: 1 %
Lymphocytes Relative: 43 %
Lymphs Abs: 1.8 10*3/uL (ref 0.7–4.0)
MCH: 29.6 pg (ref 26.0–34.0)
MCHC: 32 g/dL (ref 30.0–36.0)
MCV: 92.5 fL (ref 80.0–100.0)
Monocytes Absolute: 0.4 10*3/uL (ref 0.1–1.0)
Monocytes Relative: 10 %
Neutro Abs: 1.8 10*3/uL (ref 1.7–7.7)
Neutrophils Relative %: 43 %
Platelets: 209 10*3/uL (ref 150–400)
RBC: 3.45 MIL/uL — ABNORMAL LOW (ref 3.87–5.11)
RDW: 14.5 % (ref 11.5–15.5)
WBC: 4 10*3/uL (ref 4.0–10.5)
nRBC: 0 % (ref 0.0–0.2)

## 2021-08-23 MED ORDER — SODIUM CHLORIDE 0.9 % IV SOLN
6.0000 mg | Freq: Once | INTRAVENOUS | Status: AC
  Administered 2021-08-23: 6 mg via INTRAVENOUS
  Filled 2021-08-23: qty 0.6

## 2021-08-23 MED ORDER — SODIUM CHLORIDE 0.9 % IV SOLN: Freq: Once | INTRAVENOUS | Status: AC

## 2021-08-23 MED ORDER — SODIUM CHLORIDE 0.9% FLUSH
10.0000 mL | INTRAVENOUS | Status: DC | PRN
  Administered 2021-08-23: 10 mL

## 2021-08-23 MED ORDER — DEXTROSE 5 % IV SOLN
56.0000 mg/m2 | Freq: Once | INTRAVENOUS | Status: AC
  Administered 2021-08-23: 120 mg via INTRAVENOUS
  Filled 2021-08-23: qty 60

## 2021-08-23 MED ORDER — PROCHLORPERAZINE MALEATE 10 MG PO TABS
10.0000 mg | ORAL_TABLET | Freq: Once | ORAL | Status: AC
  Administered 2021-08-23: 10 mg via ORAL
  Filled 2021-08-23: qty 1

## 2021-08-23 MED ORDER — HEPARIN SOD (PORK) LOCK FLUSH 100 UNIT/ML IV SOLN
500.0000 [IU] | Freq: Once | INTRAVENOUS | Status: AC | PRN
  Administered 2021-08-23: 500 [IU]

## 2021-08-23 MED ORDER — SODIUM CHLORIDE 0.9% FLUSH: 10.0000 mL | Freq: Once | INTRAVENOUS | Status: DC | PRN

## 2021-08-23 MED ORDER — ACETAMINOPHEN 500 MG PO TABS
1000.0000 mg | ORAL_TABLET | Freq: Once | ORAL | Status: AC
  Administered 2021-08-23: 1000 mg via ORAL
  Filled 2021-08-23: qty 2

## 2021-08-23 MED ORDER — SODIUM CHLORIDE 0.9% FLUSH
10.0000 mL | Freq: Once | INTRAVENOUS | Status: AC
  Administered 2021-08-23: 10 mL

## 2021-09-06 ENCOUNTER — Inpatient Hospital Stay: Payer: Medicare Other

## 2021-09-06 ENCOUNTER — Inpatient Hospital Stay (HOSPITAL_BASED_OUTPATIENT_CLINIC_OR_DEPARTMENT_OTHER): Payer: Medicare Other | Admitting: Hematology

## 2021-09-06 ENCOUNTER — Other Ambulatory Visit: Payer: Self-pay

## 2021-09-06 VITALS — BP 149/69 | HR 77 | Temp 97.7°F | Resp 20 | Wt 200.8 lb

## 2021-09-06 DIAGNOSIS — C9001 Multiple myeloma in remission: Secondary | ICD-10-CM | POA: Diagnosis not present

## 2021-09-06 DIAGNOSIS — C9000 Multiple myeloma not having achieved remission: Secondary | ICD-10-CM

## 2021-09-06 DIAGNOSIS — Z95828 Presence of other vascular implants and grafts: Secondary | ICD-10-CM

## 2021-09-06 DIAGNOSIS — Z5112 Encounter for antineoplastic immunotherapy: Secondary | ICD-10-CM | POA: Diagnosis not present

## 2021-09-06 DIAGNOSIS — Z23 Encounter for immunization: Secondary | ICD-10-CM

## 2021-09-06 DIAGNOSIS — Z7189 Other specified counseling: Secondary | ICD-10-CM

## 2021-09-06 LAB — CMP (CANCER CENTER ONLY)
ALT: 22 U/L (ref 0–44)
AST: 20 U/L (ref 15–41)
Albumin: 3.8 g/dL (ref 3.5–5.0)
Alkaline Phosphatase: 63 U/L (ref 38–126)
Anion gap: 6 (ref 5–15)
BUN: 16 mg/dL (ref 8–23)
CO2: 23 mmol/L (ref 22–32)
Calcium: 8.7 mg/dL — ABNORMAL LOW (ref 8.9–10.3)
Chloride: 110 mmol/L (ref 98–111)
Creatinine: 0.82 mg/dL (ref 0.44–1.00)
GFR, Estimated: >60 mL/min (ref >60)
Glucose, Bld: 112 mg/dL — ABNORMAL HIGH (ref 70–99)
Potassium: 4.1 mmol/L (ref 3.5–5.1)
Sodium: 139 mmol/L (ref 135–145)
Total Bilirubin: 1.2 mg/dL (ref 0.3–1.2)
Total Protein: 6.5 g/dL (ref 6.5–8.1)

## 2021-09-06 LAB — CBC WITH DIFFERENTIAL/PLATELET
Abs Immature Granulocytes: 0 10*3/uL (ref 0.00–0.07)
Basophils Absolute: 0 10*3/uL (ref 0.0–0.1)
Basophils Relative: 0 %
Eosinophils Absolute: 0.1 10*3/uL (ref 0.0–0.5)
Eosinophils Relative: 2 %
HCT: 31.8 % — ABNORMAL LOW (ref 36.0–46.0)
Hemoglobin: 10.2 g/dL — ABNORMAL LOW (ref 12.0–15.0)
Immature Granulocytes: 0 %
Lymphocytes Relative: 47 %
Lymphs Abs: 1.6 10*3/uL (ref 0.7–4.0)
MCH: 30.4 pg (ref 26.0–34.0)
MCHC: 32.1 g/dL (ref 30.0–36.0)
MCV: 94.6 fL (ref 80.0–100.0)
Monocytes Absolute: 0.4 10*3/uL (ref 0.1–1.0)
Monocytes Relative: 11 %
Neutro Abs: 1.3 10*3/uL — ABNORMAL LOW (ref 1.7–7.7)
Neutrophils Relative %: 40 %
Platelets: 193 10*3/uL (ref 150–400)
RBC: 3.36 MIL/uL — ABNORMAL LOW (ref 3.87–5.11)
RDW: 15.2 % (ref 11.5–15.5)
WBC: 3.4 10*3/uL — ABNORMAL LOW (ref 4.0–10.5)
nRBC: 0 % (ref 0.0–0.2)

## 2021-09-06 MED ORDER — PROCHLORPERAZINE MALEATE 10 MG PO TABS
10.0000 mg | ORAL_TABLET | Freq: Once | ORAL | Status: AC
  Administered 2021-09-06: 10 mg via ORAL
  Filled 2021-09-06: qty 1

## 2021-09-06 MED ORDER — SODIUM CHLORIDE 0.9 % IV SOLN
Freq: Once | INTRAVENOUS | Status: AC
Start: 2021-09-06 — End: 2021-09-06

## 2021-09-06 MED ORDER — SODIUM CHLORIDE 0.9% FLUSH
10.0000 mL | INTRAVENOUS | Status: DC | PRN
  Administered 2021-09-06: 10 mL

## 2021-09-06 MED ORDER — SODIUM CHLORIDE 0.9 % IV SOLN
6.0000 mg | Freq: Once | INTRAVENOUS | Status: AC
  Administered 2021-09-06: 6 mg via INTRAVENOUS
  Filled 2021-09-06: qty 0.6

## 2021-09-06 MED ORDER — HEPARIN SOD (PORK) LOCK FLUSH 100 UNIT/ML IV SOLN
500.0000 [IU] | Freq: Once | INTRAVENOUS | Status: AC | PRN
  Administered 2021-09-06: 500 [IU]

## 2021-09-06 MED ORDER — SODIUM CHLORIDE 0.9% FLUSH
10.0000 mL | Freq: Once | INTRAVENOUS | Status: AC
  Administered 2021-09-06: 10 mL

## 2021-09-06 MED ORDER — DEXTROSE 5 % IV SOLN
56.0000 mg/m2 | Freq: Once | INTRAVENOUS | Status: AC
  Administered 2021-09-06: 120 mg via INTRAVENOUS
  Filled 2021-09-06: qty 60

## 2021-09-06 MED ORDER — INFLUENZA VAC A&B SA ADJ QUAD 0.5 ML IM PRSY: 0.5000 mL | PREFILLED_SYRINGE | Freq: Once | INTRAMUSCULAR | Status: DC

## 2021-09-06 MED ORDER — ACETAMINOPHEN 500 MG PO TABS
1000.0000 mg | ORAL_TABLET | Freq: Once | ORAL | Status: AC
  Administered 2021-09-06: 1000 mg via ORAL
  Filled 2021-09-06: qty 2

## 2021-09-06 NOTE — Patient Instructions (Signed)
[facility] MEDICAL ONCOLOGY  Discharge Instructions: Thank you for choosing [facility] to provide your oncology and hematology care.   If you have a lab appointment with the Cancer Center, please go directly to the Cancer Center and check in at the registration area.   Wear comfortable clothing and clothing appropriate for easy access to any Portacath or PICC line.   We strive to give you quality time with your provider. You may need to reschedule your appointment if you arrive late (15 or more minutes).  Arriving late affects you and other patients whose appointments are after yours.  Also, if you miss three or more appointments without notifying the office, you may be dismissed from the clinic at the provider's discretion.      For prescription refill requests, have your pharmacy contact our office and allow 72 hours for refills to be completed.    Today you received the following chemotherapy and/or immunotherapy agents: Carfilzomib.      To help prevent nausea and vomiting after your treatment, we encourage you to take your nausea medication as directed.  BELOW ARE SYMPTOMS THAT SHOULD BE REPORTED IMMEDIATELY: *FEVER GREATER THAN 100.4 F (38 C) OR HIGHER *CHILLS OR SWEATING *NAUSEA AND VOMITING THAT IS NOT CONTROLLED WITH YOUR NAUSEA MEDICATION *UNUSUAL SHORTNESS OF BREATH *UNUSUAL BRUISING OR BLEEDING *URINARY PROBLEMS (pain or burning when urinating, or frequent urination) *BOWEL PROBLEMS (unusual diarrhea, constipation, pain near the anus) TENDERNESS IN MOUTH AND THROAT WITH OR WITHOUT PRESENCE OF ULCERS (sore throat, sores in mouth, or a toothache) UNUSUAL RASH, SWELLING OR PAIN  UNUSUAL VAGINAL DISCHARGE OR ITCHING   Items with * indicate a potential emergency and should be followed up as soon as possible or go to the Emergency Department if any problems should occur.  Please show the CHEMOTHERAPY ALERT CARD or IMMUNOTHERAPY ALERT CARD at check-in  to the Emergency Department and triage nurse.  Should you have questions after your visit or need to cancel or reschedule your appointment, please contact [facility]ICAL ONCOL[facility]  Dept: [phone]  and follow the prompts.  Office hours are 8:00 a.m. to 4:30 p.m. Monday - Friday. Please note that voicemails left after 4:00 p.m. may not be returned until the following business day.  We are closed weekends and major holidays. You have access to a nurse at all times for urgent questions. Please call the main number to the clinic Dept: [phone] and follow the prompts.   For any non-urgent questions, you may also contact your provider using MyChart. We now offer e-Visits for anyone 18 and older to request care online for non-urgent symptoms. For details visit [email or web address].   Also download the MyChart app! Go to the app store, search "MyChart", open the app, select , and log in with your MyChart username and password.  Due to Covid, a mask is required upon entering the hospital/clinic. If you do not have a mask, one will be given to you upon arrival. For doctor visits, patients may have 1 support person aged 18 or older with them. For treatment visits, patients cannot have anyone with them due to current Covid guidelines and our immunocompromised population.   

## 2021-09-06 NOTE — Progress Notes (Signed)
Pt's ANC today is 1.3/ per Dr Irene Limbo pt is ok for treatment.

## 2021-09-06 NOTE — Progress Notes (Signed)
Per Dr. Irene Limbo, "OK To Treat w/ANC 1.3 today"

## 2021-09-08 ENCOUNTER — Telehealth: Payer: Self-pay | Admitting: Hematology

## 2021-09-08 NOTE — Telephone Encounter (Signed)
Scheduled follow-up appointments per 11/23 los. Patient is aware. 

## 2021-09-12 ENCOUNTER — Encounter: Payer: Self-pay | Admitting: Hematology

## 2021-09-12 ENCOUNTER — Other Ambulatory Visit: Payer: Self-pay | Admitting: Internal Medicine

## 2021-09-12 DIAGNOSIS — Z1231 Encounter for screening mammogram for malignant neoplasm of breast: Secondary | ICD-10-CM

## 2021-09-12 NOTE — Progress Notes (Signed)
HEMATOLOGY/ONCOLOGY CLINIC NOTE  Date of Service: .09/06/2021    PCP: Marzetta Board MD  CHIEF COMPLAINTS/PURPOSE OF CONSULTATION:  Continued f/u for mx of myeloma   HISTORY OF PRESENTING ILLNESS:   Madison Parker is a wonderful 70 y.o. 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  Patient has a history of smoldering multiple myeloma which she reports she was diagnosed with in 2011 when she was evaluated by a hematologist in [place]. She reports that she presented with low blood counts (low WBC)   and after significant workup she had a bone marrow examination based on which she was told that she has smoldering multiple myeloma. Patient notes that she had a bone survey at that time which was negative. She was monitored there closely and then moved to [facility] in 2015 to stay with her son whose wife was being treated for breast cancer. Patient was following with Dr. Delight Hoh in [place]. She reports not having had any treatment for multiple myeloma.  Her last labs from about a year ago showed a SPEP with no OBSERVED M spike . Serum free light chains showed an elevation of kappa Free light chain 310 and lambda free light chain of 12.7 with an abnormal Kappa/ Lambda ratio of 24.38 ( up from 19 previously). Random urine showed an M protein complement of 44%.  She lives in [place] and requested transfer of care to [place]. She was offered follow-up but [facility] in [place]  But prefers to  follow [place] in [place]. Patient reports her energy levels been stable. She has chronic back pain which has improved since the patient had her spinal surgery in April 2017 (L4-L5 interbody fusion). Patient notes she has had some chronic left hip pain which is somewhat more bothersome with the navy on the lateral aspect of her upper thighs that's painful. She also notes some right hip  pain. Over the last few weeks he notes some upper neck pain especially when bending her neck backwards and tingling numbness in her right upper extremity.  Has been working on losing some weight voluntarily.  No acute other new symptoms.  AUTOLOGOUS STEM CELL TRANSPLANT:  Disease status at time of transplant: sCR MRD positive  ASBMT risk stratification: low risk, ECOG/KPS: 0 / 90%, HSCT-CI score: 0  Started mobilization on 09/03/19 using GCS-F and Plerixafor. She collected stem cells on 09/07/19. The pre-processing total was 7.18x10(6) CD34+ cells/kg. The post-processing total was 1.11x10(7) CD34+ cells/kg frozen in 3 bags.  Preparative regimen with Melphalan 200 mg/m2 on 09/14/19 (outpatient).  Infusion of stem cells - 7.4 x 10(6) on 09/15/19 (outpatient)  Admitted Day +8 for neutropenic fever, uncontrolled n/v, diarrhea  Treated with short course of prednisone for peri-engraftment syndrome  TREATMENT:  Started induction therapy with carfilozomib, lenlaidomide, and dexamethasone 28 day cycle on 04/27/2019. She had a great response to initial cycle. She developed a DVT on 06/24/19 and was started on apixaban as an anticoagulant. She had normalization of her light chains by the start of C4 on 07/28/19.  She was referred to the myeloma clinic at [facility] for BMT consult and seen on 08/10/19. Serology from her visit showed no M-spike by SPEP, a free light chain ratio of 1.07 (kappa 11.42, lambda 10.65), and an abnormal beta-2-micro of 3.02. She was started on pre-transplant evaluation while undergoing C5 of therapy.  Treatment discontinued in 07/2019 in preparation  for autologous stem cell transplant.   INTERVAL HISTORY:   Madison Parker returns today for f/u of her multiple myeloma. She is here for continued Maintenance Carfilzomib. Patient reports no rashes.  No other toxicities from continuing maintenance carfilzomib at this time. Notes chronic back and hip pains from her degenerative disc  disease and arthritis.  Lab results today 09/06/2021 CBC shows stable anemia with a hemoglobin of 10.2, WBC count of 3.4k ANC of 1.3 k and platelets of 193k CMP stable and within normal limits. Last myeloma labs from 08/09/2021 showed no observable M spike.  IFE showed IgG lambda monoclonal paraprotein. Serum kappa lambda free light chains within normal limits with normal ratio.     MEDICAL HISTORY:  Past Medical History:  Diagnosis Date   Headache    Low back pain    SBO (small bowel obstruction) ([place]) 2010   Smoldering multiple myeloma ([place])   Previous history of hypothyroidism- not currently medications Anxiety Obesity .Body mass index is 37.94 kg/m. Vitamin D deficiency Smoldering multiple myeloma diagnosed in 2011 Lumbosacral radiculopathy Left hip pain  SURGICAL HISTORY: Past Surgical History:  Procedure Laterality Date   ABDOMINAL HYSTERECTOMY     complete   BACK SURGERY     lower at [facility]   COLONOSCOPY WITH PROPOFOL N/A 08/23/2020   Procedure: COLONOSCOPY WITH PROPOFOL;  Surgeon: Harvel Quale, MD;  Location: [facility];  Service: Gastroenterology;  Laterality: N/A;  900   colonscopy  2014   GASTRIC BYPASS  yrs ago   HERNIA REPAIR     IR IMAGING GUIDED PORT INSERTION  04/20/2019   KNEE SURGERY  06/04/2009   both knees replaced    POLYPECTOMY  08/23/2020   Procedure: POLYPECTOMY;  Surgeon: Harvel Quale, MD;  Location: [facility];  Service: Gastroenterology;;   TONSILLECTOMY  age 89   TOTAL HIP ARTHROPLASTY Left 08/16/2017   Procedure: LEFT TOTAL HIP ARTHROPLASTY ANTERIOR APPROACH;  Surgeon: Dorna Leitz, MD;  Location: [facility];  Service: Orthopedics;  Laterality: Left;  Spinal surgery April 2017  SOCIAL HISTORY: Social History   Socioeconomic History   Marital status: Married    Spouse name: Not on file   Number of children: Not on file   Years of education: Not on file   Highest education level: Not on file  Occupational  History   Not on file  Tobacco Use   Smoking status: Former    Packs/day: 0.50    Years: 10.00    Pack years: 5.00    Types: Cigarettes   Smokeless tobacco: Never   Tobacco comments:    quit 1977  Vaping Use   Vaping Use: Never used  Substance and Sexual Activity   Alcohol use: Yes    Comment: occ   Drug use: No   Sexual activity: Not on file  Other Topics Concern   Not on file  Social History Narrative   Not on file   Social Determinants of Health   Financial Resource Strain: Not on file  Food Insecurity: Not on file  Transportation Needs: Not on file  Physical Activity: Not on file  Stress: Not on file  Social Connections: Not on file  Intimate Partner Violence: Not At Risk   Fear of Current or Ex-Partner: No   Emotionally Abused: No   Physically Abused: No   Sexually Abused: No  Occasional alcohol use Former smoker and smoked 1 pack per week for about 9 years has since quit.  FAMILY HISTORY: No  family history on file.  ALLERGIES:  is allergic to codeine.  MEDICATIONS:  Current Outpatient Medications  Medication Sig Dispense Refill   acyclovir (ZOVIRAX) 800 MG tablet      Calcium Carbonate-Vitamin D 600-200 MG-UNIT TABS Calcium 600 with Vitamin D3 600 mg-5 mcg (200 unit) tablet  Take 1 tablet every day by oral route as directed.     FOLIC ACID PO Take 1,740 mg by mouth daily.      lidocaine-prilocaine (EMLA) cream Apply 1 application topically as needed. 30 g 2   NYSTATIN powder Apply topically 2 (two) times daily. 15 g 1   nystatin-triamcinolone (MYCOLOG II) cream Apply topically 2 (two) times daily. 30 g 1   Probiotic Product (PROBIOTIC DAILY PO) Take 2 capsules by mouth daily. Gummies     vitamin B-12 (CYANOCOBALAMIN) 1000 MCG tablet Take 1,000 mcg by mouth daily.      Vitamin D, Ergocalciferol, (DRISDOL) 1.25 MG (50000 UNIT) CAPS capsule TAKE 1 CAPSULE ONCE A WEEK (Patient taking differently: Take 50,000 Units by mouth every 7 (seven) days. Sunday) 13  capsule 3   Current Facility-Administered Medications  Medication Dose Route Frequency Provider Last Rate Last Admin   influenza vaccine adjuvanted (FLUAD) injection 0.5 mL  0.5 mL Intramuscular Once Brunetta Genera, MD       Facility-Administered Medications Ordered in Other Visits  Medication Dose Route Frequency Provider Last Rate Last Admin   sodium chloride flush (NS) 0.9 % injection 10 mL  10 mL Intracatheter PRN Brunetta Genera, MD   10 mL at 05/11/19 1437    REVIEW OF SYSTEMS:  .10 Point review of Systems was done is negative except as noted above.    PHYSICAL EXAMINATION: ECOG FS:1 - Symptomatic but completely ambulatory  Vitals:   09/06/21 0957  BP: (!) 149/69  Pulse: 77  Resp: 20  Temp: 97.7 F (36.5 C)  SpO2: 100%    Wt Readings from Last 3 Encounters:  09/06/21 200 lb 12.8 oz (91.1 kg)  08/23/21 194 lb (88 kg)  08/09/21 200 lb 6.4 oz (90.9 kg)   Body mass index is 37.94 kg/m.    .. GENERAL:alert, in no acute distress and comfortable SKIN: no acute rashes, no significant lesions EYES: conjunctiva are pink and non-injected, sclera anicteric OROPHARYNX: MMM, no exudates, no oropharyngeal erythema or ulceration NECK: supple, no JVD LYMPH:  no palpable lymphadenopathy in the cervical, axillary or inguinal regions LUNGS: clear to auscultation b/l with normal respiratory effort HEART: regular rate & rhythm ABDOMEN:  normoactive bowel sounds , non tender, not distended. Extremity: no pedal edema PSYCH: alert & oriented x 3 with fluent speech NEURO: no focal motor/sensory deficits    LABORATORY DATA:  I have reviewed the data as listed  . CBC Latest Ref Rng & Units 09/06/2021 08/23/2021 08/09/2021  WBC 4.0 - 10.5 K/uL 3.4(L) 4.0 3.6(L)  Hemoglobin 12.0 - 15.0 g/dL 10.2(L) 10.2(L) 10.2(L)  Hematocrit 36.0 - 46.0 % 31.8(L) 31.9(L) 31.5(L)  Platelets 150 - 400 K/uL 193 209 213  ANC 1300 . CBC    Component Value Date/Time   WBC 3.4 (L)  09/06/2021 0913   RBC 3.36 (L) 09/06/2021 0913   HGB 10.2 (L) 09/06/2021 0913   HGB 10.2 (L) 04/05/2021 1328   HGB 11.3 (L) 09/30/2017 1145   HCT 31.8 (L) 09/06/2021 0913   HCT 35.1 09/30/2017 1145   PLT 193 09/06/2021 0913   PLT 161 04/05/2021 1328   PLT 172 09/30/2017 1145   MCV  94.6 09/06/2021 0913   MCV 97.5 09/30/2017 1145   MCH 30.4 09/06/2021 0913   MCHC 32.1 09/06/2021 0913   RDW 15.2 09/06/2021 0913   RDW 14.0 09/30/2017 1145   LYMPHSABS 1.6 09/06/2021 0913   LYMPHSABS 1.3 09/30/2017 1145   MONOABS 0.4 09/06/2021 0913   MONOABS 0.2 09/30/2017 1145   EOSABS 0.1 09/06/2021 0913   EOSABS 0.0 09/30/2017 1145   EOSABS 0.1 06/03/2014 1003   BASOSABS 0.0 09/06/2021 0913   BASOSABS 0.0 09/30/2017 1145     . CMP Latest Ref Rng & Units 09/06/2021 08/23/2021 08/09/2021  Glucose 70 - 99 mg/dL 112(H) 85 96  BUN 8 - 23 mg/dL 16 12 10   Creatinine 0.44 - 1.00 mg/dL 0.82 0.75 0.81  Sodium 135 - 145 mmol/L 139 142 141  Potassium 3.5 - 5.1 mmol/L 4.1 3.6 3.7  Chloride 98 - 111 mmol/L 110 113(H) 113(H)  CO2 22 - 32 mmol/L 23 21(L) 20(L)  Calcium 8.9 - 10.3 mg/dL 8.7(L) 8.8(L) 8.4(L)  Total Protein 6.5 - 8.1 g/dL 6.5 6.6 6.6  Total Bilirubin 0.3 - 1.2 mg/dL 1.2 0.9 0.8  Alkaline Phos 38 - 126 U/L 63 57 70  AST 15 - 41 U/L 20 25 20   ALT 0 - 44 U/L 22 37 23    Component     Latest Ref Rng & Units 11/08/2016  LDH     125 - 245 U/L 220  Beta 2     0.6 - 2.4 mg/L 2.5 (H)   Component     Latest Ref Rng & Units 07/30/2017  Ferritin     9 - 269 ng/ml 81  Vitamin B12     232 - 1,245 pg/mL 594   Magnesium: Component Magnesium  Latest Ref Rng & Units 1.7 - 2.4 mg/dL  10/02/2019 1.7  10/21/2019 1.7  11/12/2019 1.7    03/11/19 BM Bx:   03/11/19 Cytogenetics:            RADIOGRAPHIC STUDIES: I have personally reviewed the radiological images as listed and agreed with the findings in the report. No results found.   Bone survey 11/08/2016 IMPRESSION: 1. Small lytic  lesion in the right scapula. 2. Possible small lytic lesion in the left scapula. 3. Postoperative changes. 4. Significant degenerative changes in both hips, left greater than right. 5. No evidence for acute fracture     Electronically Signed   By: Nolon Nations M.D.   On: 11/08/2016 16:19   ASSESSMENT & PLAN:   70 y.o. very pleasant lady with history of   1) Multiple myeloma (concern for small lytic lesions in left and right scapulae) - (appears light chain producing) and Progressive anemia This was apparently diagnosed as smoldering myeloma in 2011 by her hematologist in [place]. No renal failure/hypercalcemia. Bone survey with concern for possible small lytic lesions in B/L scapulae. PET/CT scan on 03/12/2017 showed no concerning bone lesions. Initial Bm Bx with 17% kappa restricted plasma cells consistent with plasma cell neoplasm. No treatment so far. 03/11/19 BM Bx revealed involvement by 50% plasma cells; genetics -high risk t(14;16), previous genetics from April 2018 BM Bx were standard risk 03/16/19 MRI Lumbar reveals several explanations for her lower back pain including L2-L3 stenosis, but reassuringly, there was not evidence of involvement by multiple myeloma 04/01/19 PET/CT which revealed no FDG evidence of active multiple myeloma within the skeleton. No evidence of lytic lesions within the skeleton or soft tissue Plasmacytoma. 05/07/2019 DXA scan revealed osteopenia, T-score of -  1.2.  2) Chronic leukopenia - ? Related to SMM vs other nutritional deficiencies (h/o gastric bypass surgery put her at risk for nutritional deficiencies).  WBC counts of have been close to normal recently today at 3.8k with an [place] of 1600. B12 levels WNL Ferritin adequate ?additional factor -recent NSAID use. ?element of benign ethnic neutropenia. Has not had any issues with frequent infection.    4) Neuropathy/radiculopathy -Continue follow up with orthopedics  5) Superficial  Venous Thrombosis of the left small saphenous vein  06/24/2019 US venous left : Right: There is no evidence of deep vein thrombosis in the lower extremity. No cystic structure found in the popliteal fossa. Left: Findings consistent with acute superficial vein thrombosis involving the left small saphenous vein. There is no evidence of deep vein thrombosis in the lower extremity. However, portions of this examination were limited- see technologist comments above. No cystic structure found in the popliteal fossa PLAN: -Discussed pt labwork today, 09/06/2021; blood counts stable, cmp stable. -Patient has no lab or clinical evidence of progression of her multiple myeloma at this time. -The pt has no prohibitive toxicities from continuing maintenance Carfilzomib  at this time. Will continue Carflizomib at 56 mg/m.  Every 2 weeks. -Continue Zometa q12 weeks  -Will see back in 6 weeks with labs. -discussed consideration of Evusheld.  FOLLOW UP: Flu shot today Evusheld injection with next treatment in 2 weeks  -Please schedule next 4 cycles [8 doses] of Carfilzomib with port flush and labs -Continue Zometa every 3 months x 4 doses -Return to clinic with Dr. Irene Limbo in 6 weeks  . The total time spent in the appointment was 20 minutes and more than 50% was on counseling and direct patient cares.    All of the patient's questions were answered with apparent satisfaction. The patient knows to call the clinic with any problems, questions or concerns.    Sullivan Lone MD [place] AAHIVMS [facility] Hematology/Oncology Physician [facility]

## 2021-09-20 ENCOUNTER — Other Ambulatory Visit: Payer: TRICARE For Life (TFL)

## 2021-09-20 ENCOUNTER — Inpatient Hospital Stay: Payer: Medicare Other | Attending: Hematology

## 2021-09-20 ENCOUNTER — Inpatient Hospital Stay: Payer: Medicare Other

## 2021-09-20 ENCOUNTER — Other Ambulatory Visit: Payer: Self-pay

## 2021-09-20 ENCOUNTER — Encounter: Payer: Self-pay | Admitting: Hematology

## 2021-09-20 ENCOUNTER — Ambulatory Visit: Payer: TRICARE For Life (TFL)

## 2021-09-20 VITALS — BP 154/90 | HR 70 | Temp 97.9°F | Resp 18 | Wt 199.2 lb

## 2021-09-20 DIAGNOSIS — Z298 Encounter for other specified prophylactic measures: Secondary | ICD-10-CM | POA: Diagnosis not present

## 2021-09-20 DIAGNOSIS — Z9221 Personal history of antineoplastic chemotherapy: Secondary | ICD-10-CM | POA: Diagnosis not present

## 2021-09-20 DIAGNOSIS — Z7189 Other specified counseling: Secondary | ICD-10-CM

## 2021-09-20 DIAGNOSIS — C9001 Multiple myeloma in remission: Secondary | ICD-10-CM

## 2021-09-20 DIAGNOSIS — Z5112 Encounter for antineoplastic immunotherapy: Secondary | ICD-10-CM | POA: Insufficient documentation

## 2021-09-20 DIAGNOSIS — C9000 Multiple myeloma not having achieved remission: Secondary | ICD-10-CM | POA: Insufficient documentation

## 2021-09-20 DIAGNOSIS — Z95828 Presence of other vascular implants and grafts: Secondary | ICD-10-CM

## 2021-09-20 DIAGNOSIS — Z23 Encounter for immunization: Secondary | ICD-10-CM

## 2021-09-20 LAB — CMP (CANCER CENTER ONLY)
ALT: 18 U/L (ref 0–44)
AST: 16 U/L (ref 15–41)
Albumin: 3.6 g/dL (ref 3.5–5.0)
Alkaline Phosphatase: 65 U/L (ref 38–126)
Anion gap: 8 (ref 5–15)
BUN: 14 mg/dL (ref 8–23)
CO2: 20 mmol/L — ABNORMAL LOW (ref 22–32)
Calcium: 8.1 mg/dL — ABNORMAL LOW (ref 8.9–10.3)
Chloride: 113 mmol/L — ABNORMAL HIGH (ref 98–111)
Creatinine: 0.76 mg/dL (ref 0.44–1.00)
GFR, Estimated: >60 mL/min (ref >60)
Glucose, Bld: 95 mg/dL (ref 70–99)
Potassium: 3.7 mmol/L (ref 3.5–5.1)
Sodium: 141 mmol/L (ref 135–145)
Total Bilirubin: 1 mg/dL (ref 0.3–1.2)
Total Protein: 6.3 g/dL — ABNORMAL LOW (ref 6.5–8.1)

## 2021-09-20 LAB — CBC WITH DIFFERENTIAL/PLATELET
Abs Immature Granulocytes: 0.01 10*3/uL (ref 0.00–0.07)
Basophils Absolute: 0 10*3/uL (ref 0.0–0.1)
Basophils Relative: 1 %
Eosinophils Absolute: 0.1 10*3/uL (ref 0.0–0.5)
Eosinophils Relative: 2 %
HCT: 31.2 % — ABNORMAL LOW (ref 36.0–46.0)
Hemoglobin: 10.3 g/dL — ABNORMAL LOW (ref 12.0–15.0)
Immature Granulocytes: 0 %
Lymphocytes Relative: 42 %
Lymphs Abs: 1.6 10*3/uL (ref 0.7–4.0)
MCH: 31.3 pg (ref 26.0–34.0)
MCHC: 33 g/dL (ref 30.0–36.0)
MCV: 94.8 fL (ref 80.0–100.0)
Monocytes Absolute: 0.4 10*3/uL (ref 0.1–1.0)
Monocytes Relative: 11 %
Neutro Abs: 1.7 10*3/uL (ref 1.7–7.7)
Neutrophils Relative %: 44 %
Platelets: 194 10*3/uL (ref 150–400)
RBC: 3.29 MIL/uL — ABNORMAL LOW (ref 3.87–5.11)
RDW: 14.7 % (ref 11.5–15.5)
WBC: 3.8 10*3/uL — ABNORMAL LOW (ref 4.0–10.5)
nRBC: 0 % (ref 0.0–0.2)

## 2021-09-20 MED ORDER — CILGAVIMAB (PART OF EVUSHELD) INJECTION
300.0000 mg | Freq: Once | INTRAMUSCULAR | Status: AC
  Administered 2021-09-20: 300 mg via INTRAMUSCULAR
  Filled 2021-09-20: qty 3

## 2021-09-20 MED ORDER — SODIUM CHLORIDE 0.9% FLUSH
10.0000 mL | INTRAVENOUS | Status: DC | PRN
  Administered 2021-09-20: 10 mL

## 2021-09-20 MED ORDER — HEPARIN SOD (PORK) LOCK FLUSH 100 UNIT/ML IV SOLN
500.0000 [IU] | Freq: Once | INTRAVENOUS | Status: AC | PRN
  Administered 2021-09-20: 500 [IU]

## 2021-09-20 MED ORDER — PROCHLORPERAZINE MALEATE 10 MG PO TABS
10.0000 mg | ORAL_TABLET | Freq: Once | ORAL | Status: AC
  Administered 2021-09-20: 10 mg via ORAL
  Filled 2021-09-20: qty 1

## 2021-09-20 MED ORDER — ACETAMINOPHEN 500 MG PO TABS
1000.0000 mg | ORAL_TABLET | Freq: Once | ORAL | Status: AC
  Administered 2021-09-20: 1000 mg via ORAL
  Filled 2021-09-20: qty 2

## 2021-09-20 MED ORDER — SODIUM CHLORIDE 0.9 % IV SOLN
6.0000 mg | Freq: Once | INTRAVENOUS | Status: AC
  Administered 2021-09-20: 6 mg via INTRAVENOUS
  Filled 2021-09-20: qty 0.6

## 2021-09-20 MED ORDER — DEXTROSE 5 % IV SOLN
56.0000 mg/m2 | Freq: Once | INTRAVENOUS | Status: AC
  Administered 2021-09-20: 120 mg via INTRAVENOUS
  Filled 2021-09-20: qty 60

## 2021-09-20 MED ORDER — SODIUM CHLORIDE 0.9 % IV SOLN
Freq: Once | INTRAVENOUS | Status: AC
Start: 2021-09-20 — End: 2021-09-20

## 2021-09-20 MED ORDER — TIXAGEVIMAB (PART OF EVUSHELD) INJECTION
300.0000 mg | Freq: Once | INTRAMUSCULAR | Status: AC
  Administered 2021-09-20: 300 mg via INTRAMUSCULAR
  Filled 2021-09-20: qty 3

## 2021-09-20 MED ORDER — SODIUM CHLORIDE 0.9% FLUSH
10.0000 mL | Freq: Once | INTRAVENOUS | Status: AC
  Administered 2021-09-20: 10 mL

## 2021-09-20 NOTE — Patient Instructions (Signed)
[facility] ONCOLOGY  Discharge Instructions: Thank you for choosing [facility] to provide your oncology and hematology care.   If you have a lab appointment with the [facility], please go directly to the [facility] and check in at the registration area.   Wear comfortable clothing and clothing appropriate for easy access to any Portacath or PICC line.   We strive to give you quality time with your provider. You may need to reschedule your appointment if you arrive late (15 or more minutes).  Arriving late affects you and other patients whose appointments are after yours.  Also, if you miss three or more appointments without notifying the office, you may be dismissed from the clinic at the provider's discretion.      For prescription refill requests, have your pharmacy contact our office and allow 72 hours for refills to be completed.    Today you received the following chemotherapy and/or immunotherapy agent: Carfilzomib      To help prevent nausea and vomiting after your treatment, we encourage you to take your nausea medication as directed.  BELOW ARE SYMPTOMS THAT SHOULD BE REPORTED IMMEDIATELY: *FEVER GREATER THAN 100.4 F (38 C) OR HIGHER *CHILLS OR SWEATING *NAUSEA AND VOMITING THAT IS NOT CONTROLLED WITH YOUR NAUSEA MEDICATION *UNUSUAL SHORTNESS OF BREATH *UNUSUAL BRUISING OR BLEEDING *URINARY PROBLEMS (pain or burning when urinating, or frequent urination) *BOWEL PROBLEMS (unusual diarrhea, constipation, pain near the anus) TENDERNESS IN MOUTH AND THROAT WITH OR WITHOUT PRESENCE OF ULCERS (sore throat, sores in mouth, or a toothache) UNUSUAL RASH, SWELLING OR PAIN  UNUSUAL VAGINAL DISCHARGE OR ITCHING   Items with * indicate a potential emergency and should be followed up as soon as possible or go to the Emergency Department if any problems should occur.  Please show the CHEMOTHERAPY ALERT CARD or IMMUNOTHERAPY ALERT CARD at check-in to  the Emergency Department and triage nurse.  Should you have questions after your visit or need to cancel or reschedule your appointment, please contact [facility]  Dept: [phone]  and follow the prompts.  Office hours are 8:00 a.m. to 4:30 p.m. Monday - Friday. Please note that voicemails left after 4:00 p.m. may not be returned until the following business day.  We are closed weekends and major holidays. You have access to a nurse at all times for urgent questions. Please call the main number to the clinic Dept: [phone] and follow the prompts.   For any non-urgent questions, you may also contact your provider using MyChart. We now offer e-Visits for anyone 82 and older to request care online for non-urgent symptoms. For details visit [email or web address].   Also download the MyChart app! Go to the app store, search "MyChart", open the app, select [facility], and log in with your MyChart username and password.  Due to Covid, a mask is required upon entering the hospital/clinic. If you do not have a mask, one will be given to you upon arrival. For doctor visits, patients may have 1 support person aged 23 or older with them. For treatment visits, patients cannot have anyone with them due to current Covid guidelines and our immunocompromised population.

## 2021-10-04 ENCOUNTER — Inpatient Hospital Stay: Payer: Medicare Other

## 2021-10-04 ENCOUNTER — Other Ambulatory Visit: Payer: TRICARE For Life (TFL)

## 2021-10-04 ENCOUNTER — Ambulatory Visit: Payer: TRICARE For Life (TFL)

## 2021-10-04 ENCOUNTER — Other Ambulatory Visit: Payer: Self-pay

## 2021-10-04 VITALS — BP 151/79 | HR 66 | Temp 98.3°F | Resp 18 | Wt 201.0 lb

## 2021-10-04 DIAGNOSIS — C9001 Multiple myeloma in remission: Secondary | ICD-10-CM

## 2021-10-04 DIAGNOSIS — Z5112 Encounter for antineoplastic immunotherapy: Secondary | ICD-10-CM | POA: Diagnosis not present

## 2021-10-04 DIAGNOSIS — Z23 Encounter for immunization: Secondary | ICD-10-CM

## 2021-10-04 DIAGNOSIS — C9000 Multiple myeloma not having achieved remission: Secondary | ICD-10-CM

## 2021-10-04 DIAGNOSIS — Z95828 Presence of other vascular implants and grafts: Secondary | ICD-10-CM

## 2021-10-04 DIAGNOSIS — Z7189 Other specified counseling: Secondary | ICD-10-CM

## 2021-10-04 LAB — CMP (CANCER CENTER ONLY)
ALT: 18 U/L (ref 0–44)
AST: 19 U/L (ref 15–41)
Albumin: 3.9 g/dL (ref 3.5–5.0)
Alkaline Phosphatase: 57 U/L (ref 38–126)
Anion gap: 6 (ref 5–15)
BUN: 14 mg/dL (ref 8–23)
CO2: 24 mmol/L (ref 22–32)
Calcium: 8.9 mg/dL (ref 8.9–10.3)
Chloride: 110 mmol/L (ref 98–111)
Creatinine: 0.91 mg/dL (ref 0.44–1.00)
GFR, Estimated: >60 mL/min (ref >60)
Glucose, Bld: 87 mg/dL (ref 70–99)
Potassium: 4.2 mmol/L (ref 3.5–5.1)
Sodium: 140 mmol/L (ref 135–145)
Total Bilirubin: 0.9 mg/dL (ref 0.3–1.2)
Total Protein: 6.2 g/dL — ABNORMAL LOW (ref 6.5–8.1)

## 2021-10-04 LAB — CBC WITH DIFFERENTIAL/PLATELET
Abs Immature Granulocytes: 0 10*3/uL (ref 0.00–0.07)
Basophils Absolute: 0 10*3/uL (ref 0.0–0.1)
Basophils Relative: 1 %
Eosinophils Absolute: 0.1 10*3/uL (ref 0.0–0.5)
Eosinophils Relative: 2 %
HCT: 31.5 % — ABNORMAL LOW (ref 36.0–46.0)
Hemoglobin: 10.2 g/dL — ABNORMAL LOW (ref 12.0–15.0)
Immature Granulocytes: 0 %
Lymphocytes Relative: 46 %
Lymphs Abs: 1.5 10*3/uL (ref 0.7–4.0)
MCH: 31.1 pg (ref 26.0–34.0)
MCHC: 32.4 g/dL (ref 30.0–36.0)
MCV: 96 fL (ref 80.0–100.0)
Monocytes Absolute: 0.4 10*3/uL (ref 0.1–1.0)
Monocytes Relative: 12 %
Neutro Abs: 1.3 10*3/uL — ABNORMAL LOW (ref 1.7–7.7)
Neutrophils Relative %: 39 %
Platelets: 204 10*3/uL (ref 150–400)
RBC: 3.28 MIL/uL — ABNORMAL LOW (ref 3.87–5.11)
RDW: 14.6 % (ref 11.5–15.5)
WBC: 3.3 10*3/uL — ABNORMAL LOW (ref 4.0–10.5)
nRBC: 0 % (ref 0.0–0.2)

## 2021-10-04 MED ORDER — PROCHLORPERAZINE MALEATE 10 MG PO TABS
10.0000 mg | ORAL_TABLET | Freq: Once | ORAL | Status: AC
  Administered 2021-10-04: 10:00:00 10 mg via ORAL
  Filled 2021-10-04: qty 1

## 2021-10-04 MED ORDER — HEPARIN SOD (PORK) LOCK FLUSH 100 UNIT/ML IV SOLN
500.0000 [IU] | Freq: Once | INTRAVENOUS | Status: AC | PRN
  Administered 2021-10-04: 12:00:00 500 [IU]

## 2021-10-04 MED ORDER — SODIUM CHLORIDE 0.9 % IV SOLN
6.0000 mg | Freq: Once | INTRAVENOUS | Status: AC
  Administered 2021-10-04: 10:00:00 6 mg via INTRAVENOUS
  Filled 2021-10-04: qty 0.6

## 2021-10-04 MED ORDER — SODIUM CHLORIDE 0.9 % IV SOLN
Freq: Once | INTRAVENOUS | Status: AC
Start: 2021-10-04 — End: 2021-10-04

## 2021-10-04 MED ORDER — CARFILZOMIB CHEMO INJECTION 60 MG
56.0000 mg/m2 | Freq: Once | INTRAVENOUS | Status: DC
Start: 2021-10-04 — End: 2021-10-04

## 2021-10-04 MED ORDER — SODIUM CHLORIDE 0.9% FLUSH
10.0000 mL | Freq: Once | INTRAVENOUS | Status: AC
  Administered 2021-10-04: 09:00:00 10 mL

## 2021-10-04 MED ORDER — SODIUM CHLORIDE 0.9% FLUSH
10.0000 mL | INTRAVENOUS | Status: DC | PRN
  Administered 2021-10-04: 12:00:00 10 mL

## 2021-10-04 MED ORDER — ACETAMINOPHEN 500 MG PO TABS
1000.0000 mg | ORAL_TABLET | Freq: Once | ORAL | Status: AC
  Administered 2021-10-04: 10:00:00 1000 mg via ORAL
  Filled 2021-10-04: qty 2

## 2021-10-04 MED ORDER — DEXTROSE 5 % IV SOLN
56.0000 mg/m2 | Freq: Once | INTRAVENOUS | Status: AC
  Administered 2021-10-04: 11:00:00 120 mg via INTRAVENOUS
  Filled 2021-10-04: qty 60

## 2021-10-04 NOTE — Patient Instructions (Signed)
[facility]ICAL ONCOLOGY  Discharge Instructions: Thank you for choosing [facility] to provide your oncology and hematology care.   If you have a lab appointment with the Cancer Center, please go directly to the Cancer Center and check in at the registration area.   Wear comfortable clothing and clothing appropriate for easy access to any Portacath or PICC line.   We strive to give you quality time with your provider. You may need to reschedule your appointment if you arrive late (15 or more minutes).  Arriving late affects you and other patients whose appointments are after yours.  Also, if you miss three or more appointments without notifying the office, you may be dismissed from the clinic at the provider's discretion.      For prescription refill requests, have your pharmacy contact our office and allow 72 hours for refills to be completed.    Today you received the following chemotherapy and/or immunotherapy agents carfilzomib   To help prevent nausea and vomiting after your treatment, we encourage you to take your nausea medication as directed.  BELOW ARE SYMPTOMS THAT SHOULD BE REPORTED IMMEDIATELY: . *FEVER GREATER THAN 100.4 F (38 C) OR HIGHER . *CHILLS OR SWEATING . *NAUSEA AND VOMITING THAT IS NOT CONTROLLED WITH YOUR NAUSEA MEDICATION . *UNUSUAL SHORTNESS OF BREATH . *UNUSUAL BRUISING OR BLEEDING . *URINARY PROBLEMS (pain or burning when urinating, or frequent urination) . *BOWEL PROBLEMS (unusual diarrhea, constipation, pain near the anus) . TENDERNESS IN MOUTH AND THROAT WITH OR WITHOUT PRESENCE OF ULCERS (sore throat, sores in mouth, or a toothache) . UNUSUAL RASH, SWELLING OR PAIN  . UNUSUAL VAGINAL DISCHARGE OR ITCHING   Items with * indicate a potential emergency and should be followed up as soon as possible or go to the Emergency Department if any problems should occur.  Please show the CHEMOTHERAPY ALERT CARD or IMMUNOTHERAPY ALERT  CARD at check-in to the Emergency Department and triage nurse.  Should you have questions after your visit or need to cancel or reschedule your appointment, please contact [facility]  Dept: [phone]  and follow the prompts.  Office hours are 8:00 a.m. to 4:30 p.m. Monday - Friday. Please note that voicemails left after 4:00 p.m. may not be returned until the following business day.  We are closed weekends and major holidays. You have access to a nurse at all times for urgent questions. Please call the main number to the clinic Dept: [phone] and follow the prompts.   For any non-urgent questions, you may also contact your provider using MyChart. We now offer e-Visits for anyone 18 and older to request care online for non-urgent symptoms. For details visit [email or web address].   Also download the MyChart app! Go to the app store, search "MyChart", open the app, select [facility], and log in with your MyChart username and password.  Due to Covid, a mask is required upon entering the hospital/clinic. If you do not have a mask, one will be given to you upon arrival. For doctor visits, patients may have 1 support person aged 18 or older with them. For treatment visits, patients cannot have anyone with them due to current Covid guidelines and our immunocompromised population.   

## 2021-10-04 NOTE — Progress Notes (Signed)
Ok to treat today with ANC 1.3 per Dr Lorenso Courier

## 2021-10-05 LAB — KAPPA/LAMBDA LIGHT CHAINS
Kappa free light chain: 8.5 mg/L (ref 3.3–19.4)
Kappa, lambda light chain ratio: 1.12 (ref 0.26–1.65)
Lambda free light chains: 7.6 mg/L (ref 5.7–26.3)

## 2021-10-12 LAB — MULTIPLE MYELOMA PANEL, SERUM
Albumin SerPl Elph-Mcnc: 3.6 g/dL (ref 2.9–4.4)
Albumin/Glob SerPl: 1.6 (ref 0.7–1.7)
Alpha 1: 0.2 g/dL (ref 0.0–0.4)
Alpha2 Glob SerPl Elph-Mcnc: 0.6 g/dL (ref 0.4–1.0)
B-Globulin SerPl Elph-Mcnc: 0.9 g/dL (ref 0.7–1.3)
Gamma Glob SerPl Elph-Mcnc: 0.7 g/dL (ref 0.4–1.8)
Globulin, Total: 2.4 g/dL (ref 2.2–3.9)
IgA: 24 mg/dL — ABNORMAL LOW (ref 87–352)
IgG (Immunoglobin G), Serum: 723 mg/dL (ref 586–1602)
IgM (Immunoglobulin M), Srm: 19 mg/dL — ABNORMAL LOW (ref 26–217)
Total Protein ELP: 6 g/dL (ref 6.0–8.5)

## 2021-10-13 DIAGNOSIS — Z1231 Encounter for screening mammogram for malignant neoplasm of breast: Secondary | ICD-10-CM

## 2021-10-18 ENCOUNTER — Ambulatory Visit: Payer: TRICARE For Life (TFL)

## 2021-10-18 ENCOUNTER — Other Ambulatory Visit: Payer: Self-pay

## 2021-10-18 ENCOUNTER — Encounter: Payer: Self-pay | Admitting: Hematology

## 2021-10-18 ENCOUNTER — Other Ambulatory Visit: Payer: TRICARE For Life (TFL)

## 2021-10-18 ENCOUNTER — Ambulatory Visit: Payer: Medicare Other

## 2021-10-18 DIAGNOSIS — C9000 Multiple myeloma not having achieved remission: Secondary | ICD-10-CM

## 2021-10-19 ENCOUNTER — Other Ambulatory Visit: Payer: Self-pay

## 2021-10-19 ENCOUNTER — Other Ambulatory Visit: Payer: Self-pay | Admitting: Hematology

## 2021-10-19 ENCOUNTER — Inpatient Hospital Stay: Payer: Medicare Other

## 2021-10-19 ENCOUNTER — Inpatient Hospital Stay: Payer: Medicare Other | Admitting: Hematology

## 2021-10-19 ENCOUNTER — Encounter: Payer: Self-pay | Admitting: Hematology

## 2021-10-19 ENCOUNTER — Inpatient Hospital Stay: Payer: Medicare Other | Attending: Hematology

## 2021-10-19 VITALS — BP 151/80 | HR 73 | Temp 98.4°F | Resp 18 | Wt 199.2 lb

## 2021-10-19 DIAGNOSIS — C9000 Multiple myeloma not having achieved remission: Secondary | ICD-10-CM

## 2021-10-19 DIAGNOSIS — Z95828 Presence of other vascular implants and grafts: Secondary | ICD-10-CM

## 2021-10-19 DIAGNOSIS — Z23 Encounter for immunization: Secondary | ICD-10-CM | POA: Diagnosis not present

## 2021-10-19 DIAGNOSIS — Z7189 Other specified counseling: Secondary | ICD-10-CM

## 2021-10-19 DIAGNOSIS — Z5112 Encounter for antineoplastic immunotherapy: Secondary | ICD-10-CM | POA: Insufficient documentation

## 2021-10-19 LAB — CMP (CANCER CENTER ONLY)
ALT: 17 U/L (ref 0–44)
AST: 19 U/L (ref 15–41)
Albumin: 3.9 g/dL (ref 3.5–5.0)
Alkaline Phosphatase: 55 U/L (ref 38–126)
Anion gap: 6 (ref 5–15)
BUN: 14 mg/dL (ref 8–23)
CO2: 24 mmol/L (ref 22–32)
Calcium: 8.6 mg/dL — ABNORMAL LOW (ref 8.9–10.3)
Chloride: 112 mmol/L — ABNORMAL HIGH (ref 98–111)
Creatinine: 0.81 mg/dL (ref 0.44–1.00)
GFR, Estimated: >60 mL/min (ref >60)
Glucose, Bld: 82 mg/dL (ref 70–99)
Potassium: 3.5 mmol/L (ref 3.5–5.1)
Sodium: 142 mmol/L (ref 135–145)
Total Bilirubin: 0.9 mg/dL (ref 0.3–1.2)
Total Protein: 6.3 g/dL — ABNORMAL LOW (ref 6.5–8.1)

## 2021-10-19 LAB — CBC WITH DIFFERENTIAL (CANCER CENTER ONLY)
Abs Immature Granulocytes: 0 10*3/uL (ref 0.00–0.07)
Basophils Absolute: 0 10*3/uL (ref 0.0–0.1)
Basophils Relative: 1 %
Eosinophils Absolute: 0 10*3/uL (ref 0.0–0.5)
Eosinophils Relative: 1 %
HCT: 31 % — ABNORMAL LOW (ref 36.0–46.0)
Hemoglobin: 9.9 g/dL — ABNORMAL LOW (ref 12.0–15.0)
Immature Granulocytes: 0 %
Lymphocytes Relative: 51 %
Lymphs Abs: 1.6 10*3/uL (ref 0.7–4.0)
MCH: 30.6 pg (ref 26.0–34.0)
MCHC: 31.9 g/dL (ref 30.0–36.0)
MCV: 95.7 fL (ref 80.0–100.0)
Monocytes Absolute: 0.4 10*3/uL (ref 0.1–1.0)
Monocytes Relative: 11 %
Neutro Abs: 1.1 10*3/uL — ABNORMAL LOW (ref 1.7–7.7)
Neutrophils Relative %: 36 %
Platelet Count: 204 10*3/uL (ref 150–400)
RBC: 3.24 MIL/uL — ABNORMAL LOW (ref 3.87–5.11)
RDW: 13.8 % (ref 11.5–15.5)
WBC Count: 3.2 10*3/uL — ABNORMAL LOW (ref 4.0–10.5)
nRBC: 0 % (ref 0.0–0.2)

## 2021-10-19 MED ORDER — SODIUM CHLORIDE 0.9 % IV SOLN
6.0000 mg | Freq: Once | INTRAVENOUS | Status: AC
  Administered 2021-10-19: 6 mg via INTRAVENOUS
  Filled 2021-10-19: qty 0.6

## 2021-10-19 MED ORDER — ACETAMINOPHEN 500 MG PO TABS
1000.0000 mg | ORAL_TABLET | Freq: Once | ORAL | Status: AC
  Administered 2021-10-19: 1000 mg via ORAL
  Filled 2021-10-19: qty 2

## 2021-10-19 MED ORDER — HEPARIN SOD (PORK) LOCK FLUSH 100 UNIT/ML IV SOLN
500.0000 [IU] | Freq: Once | INTRAVENOUS | Status: AC | PRN
  Administered 2021-10-19: 500 [IU]

## 2021-10-19 MED ORDER — SODIUM CHLORIDE 0.9 % IV SOLN: Freq: Once | INTRAVENOUS | Status: AC

## 2021-10-19 MED ORDER — PROCHLORPERAZINE MALEATE 10 MG PO TABS
10.0000 mg | ORAL_TABLET | Freq: Once | ORAL | Status: AC
  Administered 2021-10-19: 10 mg via ORAL
  Filled 2021-10-19: qty 1

## 2021-10-19 MED ORDER — SODIUM CHLORIDE 0.9% FLUSH
10.0000 mL | Freq: Once | INTRAVENOUS | Status: AC
  Administered 2021-10-19: 10 mL

## 2021-10-19 MED ORDER — DEXTROSE 5 % IV SOLN
56.0000 mg/m2 | Freq: Once | INTRAVENOUS | Status: AC
  Administered 2021-10-19: 120 mg via INTRAVENOUS
  Filled 2021-10-19: qty 60

## 2021-10-19 MED ORDER — ZOLEDRONIC ACID 4 MG/100ML IV SOLN
4.0000 mg | Freq: Once | INTRAVENOUS | Status: AC
  Administered 2021-10-19: 4 mg via INTRAVENOUS
  Filled 2021-10-19: qty 100

## 2021-10-19 MED ORDER — SODIUM CHLORIDE 0.9% FLUSH
10.0000 mL | INTRAVENOUS | Status: DC | PRN
  Administered 2021-10-19: 10 mL

## 2021-10-19 NOTE — Patient Instructions (Signed)
[facility] [place] [facility]COLOG[facility]   Discharge Instructions: Thank you for choosing [facility] to provide your oncology and hematology care.   If you have a lab appointment with the [facility], please go directly to the [facility] and check in at the registration area.   Wear comfortable clothing and clothing appropriate for easy access to any Portacath or PICC line.   We strive to give you quality time with your provider. You may need to reschedule your appointment if you arrive late (15 or more minutes).  Arriving late affects you and other patients whose appointments are after yours.  Also, if you miss three or more appointments without notifying the office, you may be dismissed from the clinic at the providers discretion.      For prescription refill requests, have your pharmacy contact our office and allow 72 hours for refills to be completed.    Today you received the following chemotherapy and/or immunotherapy agents: carfilzomib and zometa   To help prevent nausea and vomiting after your treatment, we encourage you to take your nausea medication as directed.  BELOW ARE SYMPTOMS THAT SHOULD BE REPORTED IMMEDIATELY: *FEVER GREATER THAN 100.4 F (38 C) OR HIGHER *CHILLS OR SWEATING *NAUSEA AND VOMITING THAT IS NOT CONTROLLED WITH YOUR NAUSEA MEDICATION *UNUSUAL SHORTNESS OF BREATH *UNUSUAL BRUISING OR BLEEDING *URINARY PROBLEMS (pain or burning when urinating, or frequent urination) *BOWEL PROBLEMS (unusual diarrhea, constipation, pain near the anus) TENDERNESS IN MOUTH AND THROAT WITH OR WITHOUT PRESENCE OF ULCERS (sore throat, sores in mouth, or a toothache) UNUSUAL RASH, SWELLING OR PAIN  UNUSUAL VAGINAL DISCHARGE OR ITCHING   Items with * indicate a potential emergency and should be followed up as soon as possible or go to the Emergency Department if any problems should occur.  Please show the CHEMOTHERAPY ALERT CARD or IMMUNOTHERAPY ALERT CARD at  check-in to the Emergency Department and triage nurse.  Should you have questions after your visit or need to cancel or reschedule your appointment, please contact [facility]  Dept: [phone]  and follow the prompts.  Office hours are 8:00 a.m. to 4:30 p.m. Monday - Friday. Please note that voicemails left after 4:00 p.m. may not be returned until the following business day.  We are closed weekends and major holidays. You have access to a nurse at all times for urgent questions. Please call the main number to the clinic Dept: [phone] and follow the prompts.   For any non-urgent questions, you may also contact your provider using MyChart. We now offer e-Visits for anyone 62 and older to request care online for non-urgent symptoms. For details visit [email or web address].   Also download the MyChart app! Go to the app store, search "MyChart", open the app, select , and log in with your MyChart username and password.  Due to Covid, a mask is required upon entering the hospital/clinic. If you do not have a mask, one will be given to you upon arrival. For doctor visits, patients may have 1 support person aged 78 or older with them. For treatment visits, patients cannot have anyone with them due to current Covid guidelines and our immunocompromised population.

## 2021-10-19 NOTE — Progress Notes (Signed)
Ok to treat with both ANC of 1.1 and Calcium of 8.6 for kyprolis and zometa per Dr.Kale

## 2021-10-20 DIAGNOSIS — Z79899 Other long term (current) drug therapy: Secondary | ICD-10-CM | POA: Diagnosis not present

## 2021-10-20 DIAGNOSIS — Z9484 Stem cells transplant status: Secondary | ICD-10-CM | POA: Diagnosis not present

## 2021-10-20 DIAGNOSIS — C9001 Multiple myeloma in remission: Secondary | ICD-10-CM | POA: Diagnosis not present

## 2021-10-20 DIAGNOSIS — R0789 Other chest pain: Secondary | ICD-10-CM | POA: Diagnosis not present

## 2021-10-20 DIAGNOSIS — Z52001 Unspecified donor, stem cells: Secondary | ICD-10-CM | POA: Diagnosis not present

## 2021-10-30 ENCOUNTER — Encounter: Payer: Self-pay | Admitting: Hematology

## 2021-10-30 ENCOUNTER — Other Ambulatory Visit: Payer: Self-pay

## 2021-10-30 DIAGNOSIS — C9000 Multiple myeloma not having achieved remission: Secondary | ICD-10-CM

## 2021-11-01 ENCOUNTER — Inpatient Hospital Stay: Payer: Medicare Other

## 2021-11-01 ENCOUNTER — Inpatient Hospital Stay (HOSPITAL_BASED_OUTPATIENT_CLINIC_OR_DEPARTMENT_OTHER): Payer: Medicare Other | Admitting: Hematology

## 2021-11-01 ENCOUNTER — Other Ambulatory Visit: Payer: Self-pay

## 2021-11-01 ENCOUNTER — Ambulatory Visit: Payer: TRICARE For Life (TFL)

## 2021-11-01 ENCOUNTER — Other Ambulatory Visit: Payer: TRICARE For Life (TFL)

## 2021-11-01 VITALS — BP 143/81 | HR 76 | Temp 98.3°F | Resp 18 | Wt 201.8 lb

## 2021-11-01 DIAGNOSIS — Z23 Encounter for immunization: Secondary | ICD-10-CM | POA: Diagnosis not present

## 2021-11-01 DIAGNOSIS — Z5111 Encounter for antineoplastic chemotherapy: Secondary | ICD-10-CM

## 2021-11-01 DIAGNOSIS — C9000 Multiple myeloma not having achieved remission: Secondary | ICD-10-CM

## 2021-11-01 DIAGNOSIS — C9001 Multiple myeloma in remission: Secondary | ICD-10-CM | POA: Diagnosis not present

## 2021-11-01 DIAGNOSIS — Z5112 Encounter for antineoplastic immunotherapy: Secondary | ICD-10-CM | POA: Diagnosis not present

## 2021-11-01 DIAGNOSIS — Z7189 Other specified counseling: Secondary | ICD-10-CM

## 2021-11-01 LAB — CBC WITH DIFFERENTIAL (CANCER CENTER ONLY)
Abs Immature Granulocytes: 0.01 10*3/uL (ref 0.00–0.07)
Basophils Absolute: 0 10*3/uL (ref 0.0–0.1)
Basophils Relative: 1 %
Eosinophils Absolute: 0.1 10*3/uL (ref 0.0–0.5)
Eosinophils Relative: 2 %
HCT: 31.5 % — ABNORMAL LOW (ref 36.0–46.0)
Hemoglobin: 10.2 g/dL — ABNORMAL LOW (ref 12.0–15.0)
Immature Granulocytes: 0 %
Lymphocytes Relative: 57 %
Lymphs Abs: 1.8 10*3/uL (ref 0.7–4.0)
MCH: 30.8 pg (ref 26.0–34.0)
MCHC: 32.4 g/dL (ref 30.0–36.0)
MCV: 95.2 fL (ref 80.0–100.0)
Monocytes Absolute: 0.4 10*3/uL (ref 0.1–1.0)
Monocytes Relative: 12 %
Neutro Abs: 0.9 10*3/uL — ABNORMAL LOW (ref 1.7–7.7)
Neutrophils Relative %: 28 %
Platelet Count: 198 10*3/uL (ref 150–400)
RBC: 3.31 MIL/uL — ABNORMAL LOW (ref 3.87–5.11)
RDW: 13.8 % (ref 11.5–15.5)
WBC Count: 3.2 10*3/uL — ABNORMAL LOW (ref 4.0–10.5)
nRBC: 0 % (ref 0.0–0.2)

## 2021-11-01 LAB — CMP (CANCER CENTER ONLY)
ALT: 21 U/L (ref 0–44)
AST: 22 U/L (ref 15–41)
Albumin: 3.9 g/dL (ref 3.5–5.0)
Alkaline Phosphatase: 66 U/L (ref 38–126)
Anion gap: 6 (ref 5–15)
BUN: 10 mg/dL (ref 8–23)
CO2: 23 mmol/L (ref 22–32)
Calcium: 8.4 mg/dL — ABNORMAL LOW (ref 8.9–10.3)
Chloride: 110 mmol/L (ref 98–111)
Creatinine: 0.79 mg/dL (ref 0.44–1.00)
GFR, Estimated: >60 mL/min (ref >60)
Glucose, Bld: 91 mg/dL (ref 70–99)
Potassium: 3.8 mmol/L (ref 3.5–5.1)
Sodium: 139 mmol/L (ref 135–145)
Total Bilirubin: 1.1 mg/dL (ref 0.3–1.2)
Total Protein: 6.2 g/dL — ABNORMAL LOW (ref 6.5–8.1)

## 2021-11-01 MED ORDER — ACETAMINOPHEN 500 MG PO TABS
1000.0000 mg | ORAL_TABLET | Freq: Once | ORAL | Status: AC
  Administered 2021-11-01: 1000 mg via ORAL
  Filled 2021-11-01: qty 2

## 2021-11-01 MED ORDER — ACYCLOVIR 800 MG PO TABS: 400.0000 mg | ORAL_TABLET | Freq: Two times a day (BID) | ORAL | 11 refills | Status: DC

## 2021-11-01 MED ORDER — INFLUENZA VAC A&B SA ADJ QUAD 0.5 ML IM PRSY
0.5000 mL | PREFILLED_SYRINGE | Freq: Once | INTRAMUSCULAR | Status: AC
  Administered 2021-11-01: 0.5 mL via INTRAMUSCULAR
  Filled 2021-11-01: qty 0.5

## 2021-11-01 MED ORDER — SODIUM CHLORIDE 0.9 % IV SOLN: Freq: Once | INTRAVENOUS | Status: AC

## 2021-11-01 MED ORDER — SODIUM CHLORIDE 0.9 % IV SOLN
6.0000 mg | Freq: Once | INTRAVENOUS | Status: AC
  Administered 2021-11-01: 6 mg via INTRAVENOUS
  Filled 2021-11-01: qty 0.6

## 2021-11-01 MED ORDER — HEPARIN SOD (PORK) LOCK FLUSH 100 UNIT/ML IV SOLN
500.0000 [IU] | Freq: Once | INTRAVENOUS | Status: AC | PRN
  Administered 2021-11-01: 500 [IU]

## 2021-11-01 MED ORDER — SODIUM CHLORIDE 0.9% FLUSH
10.0000 mL | INTRAVENOUS | Status: DC | PRN
  Administered 2021-11-01: 10 mL

## 2021-11-01 MED ORDER — PROCHLORPERAZINE MALEATE 10 MG PO TABS
10.0000 mg | ORAL_TABLET | Freq: Once | ORAL | Status: AC
  Administered 2021-11-01: 10 mg via ORAL
  Filled 2021-11-01: qty 1

## 2021-11-01 MED ORDER — DEXTROSE 5 % IV SOLN
56.0000 mg/m2 | Freq: Once | INTRAVENOUS | Status: AC
  Administered 2021-11-01: 120 mg via INTRAVENOUS
  Filled 2021-11-01: qty 60

## 2021-11-01 NOTE — Progress Notes (Signed)
Per Dr. Irene Limbo - okay to proceed with treatment with ANC of 0.9.

## 2021-11-01 NOTE — Patient Instructions (Signed)
[facility]   Discharge Instructions: Thank you for choosing [facility] to provide your oncology and hematology care.   If you have a lab appointment with the Cancer Center, please go directly to the Cancer Center and check in at the registration area.   Wear comfortable clothing and clothing appropriate for easy access to any Portacath or PICC line.   We strive to give you quality time with your provider. You may need to reschedule your appointment if you arrive late (15 or more minutes).  Arriving late affects you and other patients whose appointments are after yours.  Also, if you miss three or more appointments without notifying the office, you may be dismissed from the clinic at the provider's discretion.      For prescription refill requests, have your pharmacy contact our office and allow 72 hours for refills to be completed.    Today you received the following chemotherapy and/or immunotherapy agents: Carfilzomib (Kyprolis)     To help prevent nausea and vomiting after your treatment, we encourage you to take your nausea medication as directed.  BELOW ARE SYMPTOMS THAT SHOULD BE REPORTED IMMEDIATELY: *FEVER GREATER THAN 100.4 F (38 C) OR HIGHER *CHILLS OR SWEATING *NAUSEA AND VOMITING THAT IS NOT CONTROLLED WITH YOUR NAUSEA MEDICATION *UNUSUAL SHORTNESS OF BREATH *UNUSUAL BRUISING OR BLEEDING *URINARY PROBLEMS (pain or burning when urinating, or frequent urination) *BOWEL PROBLEMS (unusual diarrhea, constipation, pain near the anus) TENDERNESS IN MOUTH AND THROAT WITH OR WITHOUT PRESENCE OF ULCERS (sore throat, sores in mouth, or a toothache) UNUSUAL RASH, SWELLING OR PAIN  UNUSUAL VAGINAL DISCHARGE OR ITCHING   Items with * indicate a potential emergency and should be followed up as soon as possible or go to the Emergency Department if any problems should occur.  Please show the CHEMOTHERAPY ALERT CARD or IMMUNOTHERAPY ALERT CARD  at check-in to the Emergency Department and triage nurse.  Should you have questions after your visit or need to cancel or reschedule your appointment, please contact [facility]  Dept: [phone]  and follow the prompts.  Office hours are 8:00 a.m. to 4:30 p.m. Monday - Friday. Please note that voicemails left after 4:00 p.m. may not be returned until the following business day.  We are closed weekends and major holidays. You have access to a nurse at all times for urgent questions. Please call the main number to the clinic Dept: [phone] and follow the prompts.   For any non-urgent questions, you may also contact your provider using MyChart. We now offer e-Visits for anyone 18 and older to request care online for non-urgent symptoms. For details visit [email or web address].   Also download the MyChart app! Go to the app store, search "MyChart", open the app, select [facility], and log in with your MyChart username and password.  Due to Covid, a mask is required upon entering the hospital/clinic. If you do not have a mask, one will be given to you upon arrival. For doctor visits, patients may have 1 support person aged 18 or older with them. For treatment visits, patients cannot have anyone with them due to current Covid guidelines and our immunocompromised population.  

## 2021-11-01 NOTE — Progress Notes (Signed)
Per Dr. Irene Limbo - patient to receive flu shot today. Patient observed for 30 minutes following administration of flu shot. Patient in stable condition.  Patient received vaccine information packet.

## 2021-11-02 ENCOUNTER — Telehealth: Payer: Self-pay | Admitting: Hematology

## 2021-11-02 NOTE — Telephone Encounter (Signed)
Scheduled follow-up appointments per 1/18 los. Patient is aware. °

## 2021-11-03 ENCOUNTER — Ambulatory Visit
Admission: RE | Admit: 2021-11-03 | Discharge: 2021-11-03 | Disposition: A | Discharge status: Home / Self Care | Payer: Medicare Other | Source: Ambulatory Visit | Attending: Internal Medicine | Admitting: Internal Medicine

## 2021-11-03 DIAGNOSIS — Z1231 Encounter for screening mammogram for malignant neoplasm of breast: Secondary | ICD-10-CM | POA: Diagnosis not present

## 2021-11-07 ENCOUNTER — Encounter: Payer: Self-pay | Admitting: Hematology

## 2021-11-07 NOTE — Progress Notes (Addendum)
HEMATOLOGY/ONCOLOGY CLINIC NOTE  Date of Service: .11/01/2021  PCP: Marzetta Board MD  CHIEF COMPLAINTS/PURPOSE OF CONSULTATION:  Follow-up for continued evaluation and management of multiple myeloma  HISTORY OF PRESENTING ILLNESS:   Madison Parker is a wonderful 71 y.o. 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  Patient has a history of smoldering multiple myeloma which she reports she was diagnosed with in 2011 when she was evaluated by a hematologist in [place]. She reports that she presented with low blood counts (low WBC)   and after significant workup she had a bone marrow examination based on which she was told that she has smoldering multiple myeloma. Patient notes that she had a bone survey at that time which was negative. She was monitored there closely and then moved to [facility] in 2015 to stay with her son whose wife was being treated for breast cancer. Patient was following with Dr. Delight Hoh in [place]. She reports not having had any treatment for multiple myeloma.  Her last labs from about a year ago showed a SPEP with no OBSERVED M spike . Serum free light chains showed an elevation of kappa Free light chain 310 and lambda free light chain of 12.7 with an abnormal Kappa/ Lambda ratio of 24.38 ( up from 19 previously). Random urine showed an M protein complement of 44%.  She lives in [place] and requested transfer of care to [place]. She was offered follow-up but [facility] in [place]  But prefers to  follow [place] in [place]. Patient reports her energy levels been stable. She has chronic back pain which has improved since the patient had her spinal surgery in April 2017 (L4-L5 interbody fusion). Patient notes she has had some chronic left hip pain which is somewhat more bothersome with the navy on the lateral aspect of her upper thighs that's painful.  She also notes some right hip pain. Over the last few weeks he notes some upper neck pain especially when bending her neck backwards and tingling numbness in her right upper extremity.  Has been working on losing some weight voluntarily.  No acute other new symptoms.  AUTOLOGOUS STEM CELL TRANSPLANT:  Disease status at time of transplant: sCR MRD positive  ASBMT risk stratification: low risk, ECOG/KPS: 0 / 90%, HSCT-CI score: 0  Started mobilization on 09/03/19 using GCS-F and Plerixafor. She collected stem cells on 09/07/19. The pre-processing total was 7.18x10(6) CD34+ cells/kg. The post-processing total was 1.11x10(7) CD34+ cells/kg frozen in 3 bags.  Preparative regimen with Melphalan 200 mg/m2 on 09/14/19 (outpatient).  Infusion of stem cells - 7.4 x 10(6) on 09/15/19 (outpatient)  Admitted Day +8 for neutropenic fever, uncontrolled n/v, diarrhea  Treated with short course of prednisone for peri-engraftment syndrome  TREATMENT:  Started induction therapy with carfilozomib, lenlaidomide, and dexamethasone 28 day cycle on 04/27/2019. She had a great response to initial cycle. She developed a DVT on 06/24/19 and was started on apixaban as an anticoagulant. She had normalization of her light chains by the start of C4 on 07/28/19.  She was referred to the myeloma clinic at [facility] for BMT consult and seen on 08/10/19. Serology from her visit showed no M-spike by SPEP, a free light chain ratio of 1.07 (kappa 11.42, lambda 10.65), and an abnormal beta-2-micro of 3.02. She was started on pre-transplant evaluation while undergoing C5 of therapy.  Treatment discontinued in 07/2019 in  preparation for autologous stem cell transplant.   INTERVAL HISTORY:  11/01/2021  Madison Parker is here for continued evaluation and management of her multiple myeloma and for next dose of her maintenance Carfilzomib. She did have a routine follow-up with her transplant team at [facility] on 10/20/2021. She notes  intermittent chronic back and hip pains related to her arthritis. She has been trying to stay physically active. Overall doing well emotionally..  Labs today 11/01/2021 show stable CBC with a hemoglobin of 10.2, WBC count of 3.2k with an ANC of 900 and platelets of 198k CMP stable and within normal limits Recent myeloma labs on 10/20/2021 showed no M spike and normal kappa lambda free light chains and kappa lambda ratio.   MEDICAL HISTORY:  Past Medical History:  Diagnosis Date   Headache    Low back pain    SBO (small bowel obstruction) ([facility]) 2010   Smoldering multiple myeloma   Previous history of hypothyroidism- not currently medications Anxiety Obesity .There is no height or weight on file to calculate BMI. Vitamin D deficiency Smoldering multiple myeloma diagnosed in 2011 Lumbosacral radiculopathy Left hip pain  SURGICAL HISTORY: Past Surgical History:  Procedure Laterality Date   ABDOMINAL HYSTERECTOMY     complete   BACK SURGERY     lower at [facility]   COLONOSCOPY WITH PROPOFOL N/A 08/23/2020   Procedure: COLONOSCOPY WITH PROPOFOL;  Surgeon: Harvel Quale, MD;  Location: [facility];  Service: Gastroenterology;  Laterality: N/A;  900   colonscopy  2014   GASTRIC BYPASS  yrs ago   HERNIA REPAIR     IR IMAGING GUIDED PORT INSERTION  04/20/2019   KNEE SURGERY  06/04/2009   both knees replaced    POLYPECTOMY  08/23/2020   Procedure: POLYPECTOMY;  Surgeon: Harvel Quale, MD;  Location: [facility];  Service: Gastroenterology;;   TONSILLECTOMY  age 42   TOTAL HIP ARTHROPLASTY Left 08/16/2017   Procedure: LEFT TOTAL HIP ARTHROPLASTY ANTERIOR APPROACH;  Surgeon: Dorna Leitz, MD;  Location: [facility];  Service: Orthopedics;  Laterality: Left;  Spinal surgery April 2017  SOCIAL HISTORY: Social History   Socioeconomic History   Marital status: Married    Spouse name: Not on file   Number of children: Not on file   Years of education: Not on file    Highest education level: Not on file  Occupational History   Not on file  Tobacco Use   Smoking status: Former    Packs/day: 0.50    Years: 10.00    Pack years: 5.00    Types: Cigarettes   Smokeless tobacco: Never   Tobacco comments:    quit 1977  Vaping Use   Vaping Use: Never used  Substance and Sexual Activity   Alcohol use: Yes    Comment: occ   Drug use: No   Sexual activity: Not on file  Other Topics Concern   Not on file  Social History Narrative   Not on file   Social Determinants of Health   Financial Resource Strain: Not on file  Food Insecurity: Not on file  Transportation Needs: Not on file  Physical Activity: Not on file  Stress: Not on file  Social Connections: Not on file  Intimate Partner Violence: Not At Risk   Fear of Current or Ex-Partner: No   Emotionally Abused: No   Physically Abused: No   Sexually Abused: No  Occasional alcohol use Former smoker and smoked 1 pack per week for about  9 years has since quit.  FAMILY HISTORY: Family History  Problem Relation Age of Onset   Breast cancer Neg Hx     ALLERGIES:  is allergic to codeine.  MEDICATIONS:  Current Outpatient Medications  Medication Sig Dispense Refill   acyclovir (ZOVIRAX) 800 MG tablet Take 0.5 tablets (400 mg total) by mouth 2 (two) times daily. 60 tablet 11   amoxicillin (AMOXIL) 500 MG tablet Take by mouth.     Calcium Carbonate-Vitamin D 600-200 MG-UNIT TABS Calcium 600 with Vitamin D3 600 mg-5 mcg (200 unit) tablet  Take 1 tablet every day by oral route as directed.     Cholecalciferol 1.25 MG (50000 UT) TABS Take by mouth.     FOLIC ACID PO Take 4,970 mg by mouth daily.      lidocaine-prilocaine (EMLA) cream Apply 1 application topically as needed. 30 g 2   NYSTATIN powder Apply topically 2 (two) times daily. 15 g 1   nystatin-triamcinolone (MYCOLOG II) cream Apply topically 2 (two) times daily. 30 g 1   Probiotic Product (PROBIOTIC DAILY PO) Take 2 capsules by mouth daily.  Gummies     vitamin B-12 (CYANOCOBALAMIN) 1000 MCG tablet Take 1,000 mcg by mouth daily.      Vitamin D, Ergocalciferol, (DRISDOL) 1.25 MG (50000 UNIT) CAPS capsule TAKE 1 CAPSULE ONCE A WEEK (Patient taking differently: Take 50,000 Units by mouth every 7 (seven) days. Sunday) 13 capsule 3   No current facility-administered medications for this visit.   Facility-Administered Medications Ordered in Other Visits  Medication Dose Route Frequency Provider Last Rate Last Admin   sodium chloride flush (NS) 0.9 % injection 10 mL  10 mL Intracatheter PRN Brunetta Genera, MD   10 mL at 05/11/19 1437    REVIEW OF SYSTEMS:  .10 Point review of Systems was done is negative except as noted above.  PHYSICAL EXAMINATION: ECOG FS:1 - Symptomatic but completely ambulatory  There were no vitals filed for this visit.   Wt Readings from Last 3 Encounters:  11/01/21 201 lb 12 oz (91.5 kg)  10/19/21 199 lb 4 oz (90.4 kg)  10/04/21 201 lb (91.2 kg)   There is no height or weight on file to calculate BMI.   NAD . GENERAL:alert, in no acute distress and comfortable SKIN: no acute rashes, no significant lesions EYES: conjunctiva are pink and non-injected, sclera anicteric OROPHARYNX: MMM, no exudates, no oropharyngeal erythema or ulceration NECK: supple, no JVD LYMPH:  no palpable lymphadenopathy in the cervical, axillary or inguinal regions LUNGS: clear to auscultation b/l with normal respiratory effort HEART: regular rate & rhythm ABDOMEN:  normoactive bowel sounds , non tender, not distended. Extremity: no pedal edema PSYCH: alert & oriented x 3 with fluent speech NEURO: no focal motor/sensory deficits  LABORATORY DATA:  I have reviewed the data as listed  . CBC Latest Ref Rng & Units 11/15/2021 11/01/2021 10/19/2021  WBC 4.0 - 10.5 K/uL 3.6(L) 3.2(L) 3.2(L)  Hemoglobin 12.0 - 15.0 g/dL 10.6(L) 10.2(L) 9.9(L)  Hematocrit 36.0 - 46.0 % 32.5(L) 31.5(L) 31.0(L)  Platelets 150 - 400 K/uL 215  198 204  ANC 1300 . CBC    Component Value Date/Time   WBC 3.6 (L) 11/15/2021 0835   WBC 3.3 (L) 10/04/2021 0902   RBC 3.41 (L) 11/15/2021 0835   HGB 10.6 (L) 11/15/2021 0835   HGB 11.3 (L) 09/30/2017 1145   HCT 32.5 (L) 11/15/2021 0835   HCT 35.1 09/30/2017 1145   PLT 215 11/15/2021 0835  PLT 172 09/30/2017 1145   MCV 95.3 11/15/2021 0835   MCV 97.5 09/30/2017 1145   MCH 31.1 11/15/2021 0835   MCHC 32.6 11/15/2021 0835   RDW 13.9 11/15/2021 0835   RDW 14.0 09/30/2017 1145   LYMPHSABS 1.8 11/15/2021 0835   LYMPHSABS 1.3 09/30/2017 1145   MONOABS 0.5 11/15/2021 0835   MONOABS 0.2 09/30/2017 1145   EOSABS 0.1 11/15/2021 0835   EOSABS 0.0 09/30/2017 1145   EOSABS 0.1 06/03/2014 1003   BASOSABS 0.0 11/15/2021 0835   BASOSABS 0.0 09/30/2017 1145     . CMP Latest Ref Rng & Units 11/15/2021 11/01/2021 10/19/2021  Glucose 70 - 99 mg/dL 87 91 82  BUN 8 - 23 mg/dL _0 Creatinine 0.44 - 1.00 mg/dL 0.84 0.79 0.81  Sodium 135 - 145 mmol/L 140 139 142  Potassium 3.5 - 5.1 mmol/L 4.2 3.8 3.5  Chloride 98 - 111 mmol/L 108 110 112(H)  CO2 22 - 32 mmol/L _1 Calcium 8.9 - 10.3 mg/dL 9.0 8.4(L) 8.6(L)  Total Protein 6.5 - 8.1 g/dL - 6.2(L) 6.3(L)  Total Bilirubin 0.3 - 1.2 mg/dL - 1.1 0.9  Alkaline Phos 38 - 126 U/L - 66 55  AST 15 - 41 U/L - 22 19  ALT 0 - 44 U/L - 21 17   Myeloma labs 10/20/2021    03/11/19 BM Bx:   03/11/19 Cytogenetics:            RADIOGRAPHIC STUDIES: I have personally reviewed the radiological images as listed and agreed with the findings in the report. MM 3D SCREEN BREAST BILATERAL  Result Date: 11/03/2021 CLINICAL DATA:  Screening. EXAM: DIGITAL SCREENING BILATERAL MAMMOGRAM WITH TOMOSYNTHESIS AND CAD TECHNIQUE: Bilateral screening digital craniocaudal and mediolateral oblique mammograms were obtained. Bilateral screening digital breast tomosynthesis was performed. The images were evaluated with computer-aided detection. COMPARISON:   Previous exam(s). ACR Breast Density Category b: There are scattered areas of fibroglandular density. FINDINGS: There are no findings suspicious for malignancy. IMPRESSION: No mammographic evidence of malignancy. A result letter of this screening mammogram will be mailed directly to the patient. RECOMMENDATION: Screening mammogram in one year. (Code:SM-B-01Y) BI-RADS CATEGORY  1: Negative. Electronically Signed   By: Ammie Ferrier M.D.   On: 11/03/2021 13:40     Bone survey 11/08/2016 IMPRESSION: 1. Small lytic lesion in the right scapula. 2. Possible small lytic lesion in the left scapula. 3. Postoperative changes. 4. Significant degenerative changes in both hips, left greater than right. 5. No evidence for acute fracture     Electronically Signed   By: Nolon Nations M.D.   On: 11/08/2016 16:19   ASSESSMENT & PLAN:   71 y.o. very pleasant lady with history of   1) Multiple myeloma (concern for small lytic lesions in left and right scapulae) - (appears light chain producing) and Progressive anemia This was apparently diagnosed as smoldering myeloma in 2011 by her hematologist in [place]. No renal failure/hypercalcemia. Bone survey with concern for possible small lytic lesions in B/L scapulae. PET/CT scan on 03/12/2017 showed no concerning bone lesions. Initial Bm Bx with 17% kappa restricted plasma cells consistent with plasma cell neoplasm. No treatment so far. 03/11/19 BM Bx revealed involvement by 50% plasma cells; genetics -high risk t(14;16), previous genetics from April 2018 BM Bx were standard risk 03/16/19 MRI Lumbar reveals several explanations for her lower back pain including L2-L3 stenosis, but reassuringly, there was not evidence of involvement by multiple myeloma 04/01/19 PET/CT which  revealed no FDG evidence of active multiple myeloma within the skeleton. No evidence of lytic lesions within the skeleton or soft tissue Plasmacytoma. 05/07/2019 DXA scan revealed  osteopenia, T-score of -1.2.  2) Chronic leukopenia - ? Related to SMM vs other nutritional deficiencies (h/o gastric bypass surgery put her at risk for nutritional deficiencies).  WBC counts of have been close to normal recently today at 3.8k with an Mamers of 1600. B12 levels WNL Ferritin adequate ?additional factor -recent NSAID use. ?element of benign ethnic neutropenia. Has not had any issues with frequent infection.    4) Neuropathy/radiculopathy -Continue follow up with orthopedics  5) Superficial Venous Thrombosis of the left small saphenous vein  06/24/2019 US venous left : Right: There is no evidence of deep vein thrombosis in the lower extremity. No cystic structure found in the popliteal fossa. Left: Findings consistent with acute superficial vein thrombosis involving the left small saphenous vein. There is no evidence of deep vein thrombosis in the lower extremity. However, portions of this examination were limited- see technologist comments above. No cystic structure found in the popliteal fossa PLAN: -Discussed lab results from today 11/01/2021 stable CBC and CMP -Discussed myeloma labs from 10/20/2021 which shows no M spike and normal kappa lambda light chain ratios. -Patient has no lab or clinical evidence of multiple myeloma recurrence/progression at this time. -She has no notable toxicities from continuing her maintenance carfilzomib at this time.  She will continue carfilzomib at 56 mg per metered square every 2 weeks. -Continue Zometa every 12 weeks for maintenance.  FOLLOW UP: Please schedule next 4 cycles [8 doses] of Carfilzomib with port flush and labs -Continue Zometa every 12 weeks please schedule next 3 doses MD visit in 8 weeks  . The total time spent in the appointment was 30 minutes review of labs from our system and from [facility], review of outside documentation discussion of lab results, review ordering management and toxicity assessment of carfilzomib  chemotherapy and documentation.   All of the patient's questions were answered with apparent satisfaction. The patient knows to call the clinic with any problems, questions or concerns.    Sullivan Lone MD [place] AAHIVMS [facility] Hematology/Oncology Physician [facility]

## 2021-11-15 ENCOUNTER — Other Ambulatory Visit: Payer: Self-pay

## 2021-11-15 ENCOUNTER — Other Ambulatory Visit: Payer: TRICARE For Life (TFL)

## 2021-11-15 ENCOUNTER — Inpatient Hospital Stay: Payer: Medicare Other | Attending: Hematology

## 2021-11-15 ENCOUNTER — Ambulatory Visit: Payer: TRICARE For Life (TFL)

## 2021-11-15 ENCOUNTER — Inpatient Hospital Stay: Payer: Medicare Other

## 2021-11-15 ENCOUNTER — Other Ambulatory Visit: Payer: Self-pay | Admitting: *Deleted

## 2021-11-15 VITALS — BP 148/100 | HR 71 | Temp 98.2°F | Resp 18

## 2021-11-15 DIAGNOSIS — Z5112 Encounter for antineoplastic immunotherapy: Secondary | ICD-10-CM | POA: Insufficient documentation

## 2021-11-15 DIAGNOSIS — C9000 Multiple myeloma not having achieved remission: Secondary | ICD-10-CM

## 2021-11-15 DIAGNOSIS — Z7189 Other specified counseling: Secondary | ICD-10-CM

## 2021-11-15 DIAGNOSIS — Z95828 Presence of other vascular implants and grafts: Secondary | ICD-10-CM

## 2021-11-15 LAB — CBC WITH DIFFERENTIAL (CANCER CENTER ONLY)
Abs Immature Granulocytes: 0 10*3/uL (ref 0.00–0.07)
Basophils Absolute: 0 10*3/uL (ref 0.0–0.1)
Basophils Relative: 1 %
Eosinophils Absolute: 0.1 10*3/uL (ref 0.0–0.5)
Eosinophils Relative: 1 %
HCT: 32.5 % — ABNORMAL LOW (ref 36.0–46.0)
Hemoglobin: 10.6 g/dL — ABNORMAL LOW (ref 12.0–15.0)
Immature Granulocytes: 0 %
Lymphocytes Relative: 50 %
Lymphs Abs: 1.8 10*3/uL (ref 0.7–4.0)
MCH: 31.1 pg (ref 26.0–34.0)
MCHC: 32.6 g/dL (ref 30.0–36.0)
MCV: 95.3 fL (ref 80.0–100.0)
Monocytes Absolute: 0.5 10*3/uL (ref 0.1–1.0)
Monocytes Relative: 13 %
Neutro Abs: 1.3 10*3/uL — ABNORMAL LOW (ref 1.7–7.7)
Neutrophils Relative %: 35 %
Platelet Count: 215 10*3/uL (ref 150–400)
RBC: 3.41 MIL/uL — ABNORMAL LOW (ref 3.87–5.11)
RDW: 13.9 % (ref 11.5–15.5)
WBC Count: 3.6 10*3/uL — ABNORMAL LOW (ref 4.0–10.5)
nRBC: 0 % (ref 0.0–0.2)

## 2021-11-15 LAB — BASIC METABOLIC PANEL - CANCER CENTER ONLY
Anion gap: 7 (ref 5–15)
BUN: 14 mg/dL (ref 8–23)
CO2: 25 mmol/L (ref 22–32)
Calcium: 9 mg/dL (ref 8.9–10.3)
Chloride: 108 mmol/L (ref 98–111)
Creatinine: 0.84 mg/dL (ref 0.44–1.00)
GFR, Estimated: >60 mL/min (ref >60)
Glucose, Bld: 87 mg/dL (ref 70–99)
Potassium: 4.2 mmol/L (ref 3.5–5.1)
Sodium: 140 mmol/L (ref 135–145)

## 2021-11-15 MED ORDER — ACETAMINOPHEN 500 MG PO TABS
1000.0000 mg | ORAL_TABLET | Freq: Once | ORAL | Status: AC
  Administered 2021-11-15: 1000 mg via ORAL
  Filled 2021-11-15: qty 2

## 2021-11-15 MED ORDER — PROCHLORPERAZINE MALEATE 10 MG PO TABS
10.0000 mg | ORAL_TABLET | Freq: Once | ORAL | Status: AC
  Administered 2021-11-15: 10 mg via ORAL
  Filled 2021-11-15: qty 1

## 2021-11-15 MED ORDER — SODIUM CHLORIDE 0.9 % IV SOLN: Freq: Once | INTRAVENOUS | Status: AC

## 2021-11-15 MED ORDER — DEXTROSE 5 % IV SOLN
56.0000 mg/m2 | Freq: Once | INTRAVENOUS | Status: AC
  Administered 2021-11-15: 120 mg via INTRAVENOUS
  Filled 2021-11-15: qty 60

## 2021-11-15 MED ORDER — SODIUM CHLORIDE 0.9 % IV SOLN
6.0000 mg | Freq: Once | INTRAVENOUS | Status: AC
  Administered 2021-11-15: 6 mg via INTRAVENOUS
  Filled 2021-11-15: qty 0.6

## 2021-11-15 MED ORDER — HEPARIN SOD (PORK) LOCK FLUSH 100 UNIT/ML IV SOLN: 500.0000 [IU] | Freq: Once | INTRAVENOUS | Status: DC | PRN

## 2021-11-15 MED ORDER — SODIUM CHLORIDE 0.9% FLUSH: 10.0000 mL | INTRAVENOUS | Status: DC | PRN

## 2021-11-15 MED ORDER — SODIUM CHLORIDE 0.9% FLUSH
10.0000 mL | Freq: Once | INTRAVENOUS | Status: AC
  Administered 2021-11-15: 10 mL

## 2021-11-15 NOTE — Progress Notes (Signed)
Per Dr. Irene Limbo, ok to treat

## 2021-11-15 NOTE — Patient Instructions (Signed)
[facility]  Discharge Instructions: Thank you for choosing [facility] to provide your oncology and hematology care.   If you have a lab appointment with the Cancer Center, please go directly to the Cancer Center and check in at the registration area.   Wear comfortable clothing and clothing appropriate for easy access to any Portacath or PICC line.   We strive to give you quality time with your provider. You may need to reschedule your appointment if you arrive late (15 or more minutes).  Arriving late affects you and other patients whose appointments are after yours.  Also, if you miss three or more appointments without notifying the office, you may be dismissed from the clinic at the provider's discretion.      For prescription refill requests, have your pharmacy contact our office and allow 72 hours for refills to be completed.    Today you received the following chemotherapy and/or immunotherapy agents kyprolis      To help prevent nausea and vomiting after your treatment, we encourage you to take your nausea medication as directed.  BELOW ARE SYMPTOMS THAT SHOULD BE REPORTED IMMEDIATELY: . *FEVER GREATER THAN 100.4 F (38 C) OR HIGHER . *CHILLS OR SWEATING . *NAUSEA AND VOMITING THAT IS NOT CONTROLLED WITH YOUR NAUSEA MEDICATION . *UNUSUAL SHORTNESS OF BREATH . *UNUSUAL BRUISING OR BLEEDING . *URINARY PROBLEMS (pain or burning when urinating, or frequent urination) . *BOWEL PROBLEMS (unusual diarrhea, constipation, pain near the anus) . TENDERNESS IN MOUTH AND THROAT WITH OR WITHOUT PRESENCE OF ULCERS (sore throat, sores in mouth, or a toothache) . UNUSUAL RASH, SWELLING OR PAIN  . UNUSUAL VAGINAL DISCHARGE OR ITCHING   Items with * indicate a potential emergency and should be followed up as soon as possible or go to the Emergency Department if any problems should occur.  Please show the CHEMOTHERAPY ALERT CARD or IMMUNOTHERAPY ALERT  CARD at check-in to the Emergency Department and triage nurse.  Should you have questions after your visit or need to cancel or reschedule your appointment, please contact [facility]  Dept: [phone]  and follow the prompts.  Office hours are 8:00 a.m. to 4:30 p.m. Monday - Friday. Please note that voicemails left after 4:00 p.m. may not be returned until the following business day.  We are closed weekends and major holidays. You have access to a nurse at all times for urgent questions. Please call the main number to the clinic Dept: [phone] and follow the prompts.   For any non-urgent questions, you may also contact your provider using MyChart. We now offer e-Visits for anyone 18 and older to request care online for non-urgent symptoms. For details visit [email or web address].   Also download the MyChart app! Go to the app store, search "MyChart", open the app, select [place], and log in with your MyChart username and password.  Due to Covid, a mask is required upon entering the hospital/clinic. If you do not have a mask, one will be given to you upon arrival. For doctor visits, patients may have 1 support person aged 18 or older with them. For treatment visits, patients cannot have anyone with them due to current Covid guidelines and our immunocompromised population.   

## 2021-11-20 DIAGNOSIS — C9001 Multiple myeloma in remission: Secondary | ICD-10-CM | POA: Diagnosis not present

## 2021-11-20 DIAGNOSIS — Z79899 Other long term (current) drug therapy: Secondary | ICD-10-CM | POA: Diagnosis not present

## 2021-11-20 DIAGNOSIS — I3481 Nonrheumatic mitral (valve) annulus calcification: Secondary | ICD-10-CM | POA: Diagnosis not present

## 2021-11-20 DIAGNOSIS — Z9484 Stem cells transplant status: Secondary | ICD-10-CM | POA: Diagnosis not present

## 2021-11-20 DIAGNOSIS — I340 Nonrheumatic mitral (valve) insufficiency: Secondary | ICD-10-CM | POA: Diagnosis not present

## 2021-11-20 DIAGNOSIS — I081 Rheumatic disorders of both mitral and tricuspid valves: Secondary | ICD-10-CM | POA: Diagnosis not present

## 2021-11-28 ENCOUNTER — Other Ambulatory Visit: Payer: Self-pay

## 2021-11-28 DIAGNOSIS — C9000 Multiple myeloma not having achieved remission: Secondary | ICD-10-CM

## 2021-11-29 ENCOUNTER — Inpatient Hospital Stay: Payer: Medicare Other

## 2021-11-29 ENCOUNTER — Other Ambulatory Visit: Payer: Self-pay

## 2021-11-29 VITALS — BP 136/86 | HR 71 | Temp 97.9°F | Resp 17 | Wt 199.2 lb

## 2021-11-29 DIAGNOSIS — C9000 Multiple myeloma not having achieved remission: Secondary | ICD-10-CM | POA: Diagnosis not present

## 2021-11-29 DIAGNOSIS — Z5112 Encounter for antineoplastic immunotherapy: Secondary | ICD-10-CM | POA: Diagnosis not present

## 2021-11-29 DIAGNOSIS — Z95828 Presence of other vascular implants and grafts: Secondary | ICD-10-CM

## 2021-11-29 DIAGNOSIS — Z7189 Other specified counseling: Secondary | ICD-10-CM

## 2021-11-29 LAB — CBC WITH DIFFERENTIAL (CANCER CENTER ONLY)
Abs Immature Granulocytes: 0.01 10*3/uL (ref 0.00–0.07)
Basophils Absolute: 0 10*3/uL (ref 0.0–0.1)
Basophils Relative: 1 %
Eosinophils Absolute: 0.1 10*3/uL (ref 0.0–0.5)
Eosinophils Relative: 2 %
HCT: 32.6 % — ABNORMAL LOW (ref 36.0–46.0)
Hemoglobin: 10.3 g/dL — ABNORMAL LOW (ref 12.0–15.0)
Immature Granulocytes: 0 %
Lymphocytes Relative: 46 %
Lymphs Abs: 1.8 10*3/uL (ref 0.7–4.0)
MCH: 30.4 pg (ref 26.0–34.0)
MCHC: 31.6 g/dL (ref 30.0–36.0)
MCV: 96.2 fL (ref 80.0–100.0)
Monocytes Absolute: 0.4 10*3/uL (ref 0.1–1.0)
Monocytes Relative: 10 %
Neutro Abs: 1.6 10*3/uL — ABNORMAL LOW (ref 1.7–7.7)
Neutrophils Relative %: 41 %
Platelet Count: 195 10*3/uL (ref 150–400)
RBC: 3.39 MIL/uL — ABNORMAL LOW (ref 3.87–5.11)
RDW: 14.2 % (ref 11.5–15.5)
WBC Count: 3.9 10*3/uL — ABNORMAL LOW (ref 4.0–10.5)
nRBC: 0 % (ref 0.0–0.2)

## 2021-11-29 LAB — CMP (CANCER CENTER ONLY)
ALT: 20 U/L (ref 0–44)
AST: 21 U/L (ref 15–41)
Albumin: 3.9 g/dL (ref 3.5–5.0)
Alkaline Phosphatase: 53 U/L (ref 38–126)
Anion gap: 4 — ABNORMAL LOW (ref 5–15)
BUN: 20 mg/dL (ref 8–23)
CO2: 23 mmol/L (ref 22–32)
Calcium: 8.6 mg/dL — ABNORMAL LOW (ref 8.9–10.3)
Chloride: 112 mmol/L — ABNORMAL HIGH (ref 98–111)
Creatinine: 0.71 mg/dL (ref 0.44–1.00)
GFR, Estimated: >60 mL/min (ref >60)
Glucose, Bld: 93 mg/dL (ref 70–99)
Potassium: 3.8 mmol/L (ref 3.5–5.1)
Sodium: 139 mmol/L (ref 135–145)
Total Bilirubin: 0.6 mg/dL (ref 0.3–1.2)
Total Protein: 6.3 g/dL — ABNORMAL LOW (ref 6.5–8.1)

## 2021-11-29 MED ORDER — SODIUM CHLORIDE 0.9 % IV SOLN
6.0000 mg | Freq: Once | INTRAVENOUS | Status: AC
  Administered 2021-11-29: 6 mg via INTRAVENOUS
  Filled 2021-11-29: qty 0.6

## 2021-11-29 MED ORDER — SODIUM CHLORIDE 0.9 % IV SOLN: Freq: Once | INTRAVENOUS | Status: AC

## 2021-11-29 MED ORDER — SODIUM CHLORIDE 0.9% FLUSH
10.0000 mL | INTRAVENOUS | Status: DC | PRN
  Administered 2021-11-29: 10 mL

## 2021-11-29 MED ORDER — PROCHLORPERAZINE MALEATE 10 MG PO TABS
10.0000 mg | ORAL_TABLET | Freq: Once | ORAL | Status: AC
  Administered 2021-11-29: 10 mg via ORAL
  Filled 2021-11-29: qty 1

## 2021-11-29 MED ORDER — HEPARIN SOD (PORK) LOCK FLUSH 100 UNIT/ML IV SOLN
500.0000 [IU] | Freq: Once | INTRAVENOUS | Status: AC | PRN
  Administered 2021-11-29: 500 [IU]

## 2021-11-29 MED ORDER — DEXTROSE 5 % IV SOLN: 56.0000 mg/m2 | Freq: Once | INTRAVENOUS | Status: DC

## 2021-11-29 MED ORDER — ACETAMINOPHEN 500 MG PO TABS
1000.0000 mg | ORAL_TABLET | Freq: Once | ORAL | Status: AC
  Administered 2021-11-29: 1000 mg via ORAL
  Filled 2021-11-29: qty 2

## 2021-11-29 MED ORDER — DEXTROSE 5 % IV SOLN
56.0000 mg/m2 | Freq: Once | INTRAVENOUS | Status: AC
  Administered 2021-11-29: 120 mg via INTRAVENOUS
  Filled 2021-11-29: qty 60

## 2021-11-29 MED ORDER — SODIUM CHLORIDE 0.9% FLUSH
10.0000 mL | Freq: Once | INTRAVENOUS | Status: AC
  Administered 2021-11-29: 10 mL

## 2021-11-29 NOTE — Patient Instructions (Signed)
[facility]  Discharge Instructions: Thank you for choosing [facility] to provide your oncology and hematology care.   If you have a lab appointment with the Cancer Center, please go directly to the Cancer Center and check in at the registration area.   Wear comfortable clothing and clothing appropriate for easy access to any Portacath or PICC line.   We strive to give you quality time with your provider. You may need to reschedule your appointment if you arrive late (15 or more minutes).  Arriving late affects you and other patients whose appointments are after yours.  Also, if you miss three or more appointments without notifying the office, you may be dismissed from the clinic at the provider's discretion.      For prescription refill requests, have your pharmacy contact our office and allow 72 hours for refills to be completed.    Today you received the following chemotherapy and/or immunotherapy agents kyprolis      To help prevent nausea and vomiting after your treatment, we encourage you to take your nausea medication as directed.  BELOW ARE SYMPTOMS THAT SHOULD BE REPORTED IMMEDIATELY: . *FEVER GREATER THAN 100.4 F (38 C) OR HIGHER . *CHILLS OR SWEATING . *NAUSEA AND VOMITING THAT IS NOT CONTROLLED WITH YOUR NAUSEA MEDICATION . *UNUSUAL SHORTNESS OF BREATH . *UNUSUAL BRUISING OR BLEEDING . *URINARY PROBLEMS (pain or burning when urinating, or frequent urination) . *BOWEL PROBLEMS (unusual diarrhea, constipation, pain near the anus) . TENDERNESS IN MOUTH AND THROAT WITH OR WITHOUT PRESENCE OF ULCERS (sore throat, sores in mouth, or a toothache) . UNUSUAL RASH, SWELLING OR PAIN  . UNUSUAL VAGINAL DISCHARGE OR ITCHING   Items with * indicate a potential emergency and should be followed up as soon as possible or go to the Emergency Department if any problems should occur.  Please show the CHEMOTHERAPY ALERT CARD or IMMUNOTHERAPY ALERT  CARD at check-in to the Emergency Department and triage nurse.  Should you have questions after your visit or need to cancel or reschedule your appointment, please contact [facility]  Dept: [phone]  and follow the prompts.  Office hours are 8:00 a.m. to 4:30 p.m. Monday - Friday. Please note that voicemails left after 4:00 p.m. may not be returned until the following business day.  We are closed weekends and major holidays. You have access to a nurse at all times for urgent questions. Please call the main number to the clinic Dept: [phone] and follow the prompts.   For any non-urgent questions, you may also contact your provider using MyChart. We now offer e-Visits for anyone 18 and older to request care online for non-urgent symptoms. For details visit [email or web address].   Also download the MyChart app! Go to the app store, search "MyChart", open the app, select [facility], and log in with your MyChart username and password.  Due to Covid, a mask is required upon entering the hospital/clinic. If you do not have a mask, one will be given to you upon arrival. For doctor visits, patients may have 1 support person aged 18 or older with them. For treatment visits, patients cannot have anyone with them due to current Covid guidelines and our immunocompromised population.   

## 2021-12-03 DIAGNOSIS — Z20822 Contact with and (suspected) exposure to covid-19: Secondary | ICD-10-CM | POA: Diagnosis not present

## 2021-12-08 DIAGNOSIS — Z20822 Contact with and (suspected) exposure to covid-19: Secondary | ICD-10-CM | POA: Diagnosis not present

## 2021-12-11 DIAGNOSIS — E039 Hypothyroidism, unspecified: Secondary | ICD-10-CM | POA: Diagnosis not present

## 2021-12-11 DIAGNOSIS — D649 Anemia, unspecified: Secondary | ICD-10-CM | POA: Diagnosis not present

## 2021-12-12 ENCOUNTER — Other Ambulatory Visit: Payer: Self-pay

## 2021-12-12 DIAGNOSIS — C9000 Multiple myeloma not having achieved remission: Secondary | ICD-10-CM

## 2021-12-13 ENCOUNTER — Other Ambulatory Visit: Payer: Self-pay | Admitting: Hematology

## 2021-12-13 ENCOUNTER — Other Ambulatory Visit: Payer: Self-pay

## 2021-12-13 ENCOUNTER — Inpatient Hospital Stay: Payer: Medicare Other

## 2021-12-13 ENCOUNTER — Inpatient Hospital Stay: Payer: Medicare Other | Attending: Hematology

## 2021-12-13 VITALS — BP 149/76 | HR 73 | Temp 98.3°F | Resp 18 | Wt 201.4 lb

## 2021-12-13 DIAGNOSIS — C9000 Multiple myeloma not having achieved remission: Secondary | ICD-10-CM | POA: Diagnosis not present

## 2021-12-13 DIAGNOSIS — Z95828 Presence of other vascular implants and grafts: Secondary | ICD-10-CM

## 2021-12-13 DIAGNOSIS — Z7189 Other specified counseling: Secondary | ICD-10-CM

## 2021-12-13 DIAGNOSIS — Z5112 Encounter for antineoplastic immunotherapy: Secondary | ICD-10-CM | POA: Insufficient documentation

## 2021-12-13 LAB — CMP (CANCER CENTER ONLY)
ALT: 17 U/L (ref 0–44)
AST: 18 U/L (ref 15–41)
Albumin: 4 g/dL (ref 3.5–5.0)
Alkaline Phosphatase: 54 U/L (ref 38–126)
Anion gap: 6 (ref 5–15)
BUN: 11 mg/dL (ref 8–23)
CO2: 23 mmol/L (ref 22–32)
Calcium: 8.9 mg/dL (ref 8.9–10.3)
Chloride: 112 mmol/L — ABNORMAL HIGH (ref 98–111)
Creatinine: 0.79 mg/dL (ref 0.44–1.00)
GFR, Estimated: >60 mL/min (ref >60)
Glucose, Bld: 79 mg/dL (ref 70–99)
Potassium: 4 mmol/L (ref 3.5–5.1)
Sodium: 141 mmol/L (ref 135–145)
Total Bilirubin: 0.9 mg/dL (ref 0.3–1.2)
Total Protein: 6.4 g/dL — ABNORMAL LOW (ref 6.5–8.1)

## 2021-12-13 LAB — CBC WITH DIFFERENTIAL (CANCER CENTER ONLY)
Abs Immature Granulocytes: 0 10*3/uL (ref 0.00–0.07)
Basophils Absolute: 0 10*3/uL (ref 0.0–0.1)
Basophils Relative: 1 %
Eosinophils Absolute: 0 10*3/uL (ref 0.0–0.5)
Eosinophils Relative: 1 %
HCT: 31.4 % — ABNORMAL LOW (ref 36.0–46.0)
Hemoglobin: 10.5 g/dL — ABNORMAL LOW (ref 12.0–15.0)
Immature Granulocytes: 0 %
Lymphocytes Relative: 55 %
Lymphs Abs: 1.7 10*3/uL (ref 0.7–4.0)
MCH: 31.4 pg (ref 26.0–34.0)
MCHC: 33.4 g/dL (ref 30.0–36.0)
MCV: 94 fL (ref 80.0–100.0)
Monocytes Absolute: 0.4 10*3/uL (ref 0.1–1.0)
Monocytes Relative: 12 %
Neutro Abs: 1 10*3/uL — ABNORMAL LOW (ref 1.7–7.7)
Neutrophils Relative %: 31 %
Platelet Count: 231 10*3/uL (ref 150–400)
RBC: 3.34 MIL/uL — ABNORMAL LOW (ref 3.87–5.11)
RDW: 14.1 % (ref 11.5–15.5)
WBC Count: 3.1 10*3/uL — ABNORMAL LOW (ref 4.0–10.5)
nRBC: 0 % (ref 0.0–0.2)

## 2021-12-13 MED ORDER — SODIUM CHLORIDE 0.9 % IV SOLN: Freq: Once | INTRAVENOUS | Status: AC

## 2021-12-13 MED ORDER — HEPARIN SOD (PORK) LOCK FLUSH 100 UNIT/ML IV SOLN
500.0000 [IU] | Freq: Once | INTRAVENOUS | Status: AC | PRN
  Administered 2021-12-13: 500 [IU]

## 2021-12-13 MED ORDER — PROCHLORPERAZINE MALEATE 10 MG PO TABS
10.0000 mg | ORAL_TABLET | Freq: Once | ORAL | Status: AC
  Administered 2021-12-13: 10 mg via ORAL
  Filled 2021-12-13: qty 1

## 2021-12-13 MED ORDER — ACETAMINOPHEN 500 MG PO TABS
1000.0000 mg | ORAL_TABLET | Freq: Once | ORAL | Status: AC
  Administered 2021-12-13: 1000 mg via ORAL
  Filled 2021-12-13: qty 2

## 2021-12-13 MED ORDER — SODIUM CHLORIDE 0.9% FLUSH
10.0000 mL | Freq: Once | INTRAVENOUS | Status: AC
  Administered 2021-12-13: 10 mL

## 2021-12-13 MED ORDER — DEXTROSE 5 % IV SOLN
56.0000 mg/m2 | Freq: Once | INTRAVENOUS | Status: AC
  Administered 2021-12-13: 120 mg via INTRAVENOUS
  Filled 2021-12-13: qty 60

## 2021-12-13 MED ORDER — SODIUM CHLORIDE 0.9% FLUSH
10.0000 mL | INTRAVENOUS | Status: DC | PRN
  Administered 2021-12-13: 10 mL

## 2021-12-13 MED ORDER — SODIUM CHLORIDE 0.9 % IV SOLN
6.0000 mg | Freq: Once | INTRAVENOUS | Status: AC
  Administered 2021-12-13: 6 mg via INTRAVENOUS
  Filled 2021-12-13: qty 0.6

## 2021-12-13 NOTE — Patient Instructions (Signed)
[facility]  Discharge Instructions: Thank you for choosing [facility] to provide your oncology and hematology care.   If you have a lab appointment with the Cancer Center, please go directly to the Cancer Center and check in at the registration area.   Wear comfortable clothing and clothing appropriate for easy access to any Portacath or PICC line.   We strive to give you quality time with your provider. You may need to reschedule your appointment if you arrive late (15 or more minutes).  Arriving late affects you and other patients whose appointments are after yours.  Also, if you miss three or more appointments without notifying the office, you may be dismissed from the clinic at the provider's discretion.      For prescription refill requests, have your pharmacy contact our office and allow 72 hours for refills to be completed.    Today you received the following chemotherapy and/or immunotherapy agents: Carfilzomib.      To help prevent nausea and vomiting after your treatment, we encourage you to take your nausea medication as directed.  BELOW ARE SYMPTOMS THAT SHOULD BE REPORTED IMMEDIATELY: *FEVER GREATER THAN 100.4 F (38 C) OR HIGHER *CHILLS OR SWEATING *NAUSEA AND VOMITING THAT IS NOT CONTROLLED WITH YOUR NAUSEA MEDICATION *UNUSUAL SHORTNESS OF BREATH *UNUSUAL BRUISING OR BLEEDING *URINARY PROBLEMS (pain or burning when urinating, or frequent urination) *BOWEL PROBLEMS (unusual diarrhea, constipation, pain near the anus) TENDERNESS IN MOUTH AND THROAT WITH OR WITHOUT PRESENCE OF ULCERS (sore throat, sores in mouth, or a toothache) UNUSUAL RASH, SWELLING OR PAIN  UNUSUAL VAGINAL DISCHARGE OR ITCHING   Items with * indicate a potential emergency and should be followed up as soon as possible or go to the Emergency Department if any problems should occur.  Please show the CHEMOTHERAPY ALERT CARD or IMMUNOTHERAPY ALERT CARD at check-in  to the Emergency Department and triage nurse.  Should you have questions after your visit or need to cancel or reschedule your appointment, please contact [facility]  Dept: [phone]  and follow the prompts.  Office hours are 8:00 a.m. to 4:30 p.m. Monday - Friday. Please note that voicemails left after 4:00 p.m. may not be returned until the following business day.  We are closed weekends and major holidays. You have access to a nurse at all times for urgent questions. Please call the main number to the clinic Dept: [phone] and follow the prompts.   For any non-urgent questions, you may also contact your provider using MyChart. We now offer e-Visits for anyone 18 and older to request care online for non-urgent symptoms. For details visit [email or web address].   Also download the MyChart app! Go to the app store, search "MyChart", open the app, select [facility], and log in with your MyChart username and password.  Due to Covid, a mask is required upon entering the hospital/clinic. If you do not have a mask, one will be given to you upon arrival. For doctor visits, patients may have 1 support person aged 18 or older with them. For treatment visits, patients cannot have anyone with them due to current Covid guidelines and our immunocompromised population.   

## 2021-12-26 ENCOUNTER — Other Ambulatory Visit: Payer: Self-pay

## 2021-12-26 DIAGNOSIS — C9000 Multiple myeloma not having achieved remission: Secondary | ICD-10-CM

## 2021-12-27 ENCOUNTER — Other Ambulatory Visit: Payer: TRICARE For Life (TFL)

## 2021-12-27 ENCOUNTER — Inpatient Hospital Stay: Payer: Medicare Other

## 2021-12-27 ENCOUNTER — Other Ambulatory Visit: Payer: Self-pay

## 2021-12-27 ENCOUNTER — Inpatient Hospital Stay (HOSPITAL_BASED_OUTPATIENT_CLINIC_OR_DEPARTMENT_OTHER): Payer: Medicare Other | Admitting: Hematology

## 2021-12-27 ENCOUNTER — Ambulatory Visit: Payer: TRICARE For Life (TFL)

## 2021-12-27 ENCOUNTER — Other Ambulatory Visit: Payer: Self-pay | Admitting: *Deleted

## 2021-12-27 VITALS — BP 135/66 | HR 78 | Temp 97.5°F | Resp 20 | Wt 203.6 lb

## 2021-12-27 DIAGNOSIS — R635 Abnormal weight gain: Secondary | ICD-10-CM | POA: Diagnosis not present

## 2021-12-27 DIAGNOSIS — C9000 Multiple myeloma not having achieved remission: Secondary | ICD-10-CM | POA: Diagnosis not present

## 2021-12-27 DIAGNOSIS — Z5112 Encounter for antineoplastic immunotherapy: Secondary | ICD-10-CM | POA: Diagnosis not present

## 2021-12-27 DIAGNOSIS — B372 Candidiasis of skin and nail: Secondary | ICD-10-CM | POA: Diagnosis not present

## 2021-12-27 DIAGNOSIS — Z95828 Presence of other vascular implants and grafts: Secondary | ICD-10-CM

## 2021-12-27 DIAGNOSIS — C9001 Multiple myeloma in remission: Secondary | ICD-10-CM

## 2021-12-27 DIAGNOSIS — Z7189 Other specified counseling: Secondary | ICD-10-CM

## 2021-12-27 DIAGNOSIS — Z5111 Encounter for antineoplastic chemotherapy: Secondary | ICD-10-CM

## 2021-12-27 LAB — BASIC METABOLIC PANEL - CANCER CENTER ONLY
Anion gap: 5 (ref 5–15)
BUN: 15 mg/dL (ref 8–23)
CO2: 25 mmol/L (ref 22–32)
Calcium: 9.2 mg/dL (ref 8.9–10.3)
Chloride: 111 mmol/L (ref 98–111)
Creatinine: 0.82 mg/dL (ref 0.44–1.00)
GFR, Estimated: >60 mL/min (ref >60)
Glucose, Bld: 97 mg/dL (ref 70–99)
Potassium: 3.7 mmol/L (ref 3.5–5.1)
Sodium: 141 mmol/L (ref 135–145)

## 2021-12-27 LAB — CBC WITH DIFFERENTIAL (CANCER CENTER ONLY)
Abs Immature Granulocytes: 0.01 10*3/uL (ref 0.00–0.07)
Basophils Absolute: 0 10*3/uL (ref 0.0–0.1)
Basophils Relative: 1 %
Eosinophils Absolute: 0 10*3/uL (ref 0.0–0.5)
Eosinophils Relative: 1 %
HCT: 31.1 % — ABNORMAL LOW (ref 36.0–46.0)
Hemoglobin: 10.3 g/dL — ABNORMAL LOW (ref 12.0–15.0)
Immature Granulocytes: 0 %
Lymphocytes Relative: 41 %
Lymphs Abs: 1.2 10*3/uL (ref 0.7–4.0)
MCH: 31.4 pg (ref 26.0–34.0)
MCHC: 33.1 g/dL (ref 30.0–36.0)
MCV: 94.8 fL (ref 80.0–100.0)
Monocytes Absolute: 0.4 10*3/uL (ref 0.1–1.0)
Monocytes Relative: 12 %
Neutro Abs: 1.3 10*3/uL — ABNORMAL LOW (ref 1.7–7.7)
Neutrophils Relative %: 45 %
Platelet Count: 204 10*3/uL (ref 150–400)
RBC: 3.28 MIL/uL — ABNORMAL LOW (ref 3.87–5.11)
RDW: 14.3 % (ref 11.5–15.5)
WBC Count: 3 10*3/uL — ABNORMAL LOW (ref 4.0–10.5)
nRBC: 0 % (ref 0.0–0.2)

## 2021-12-27 MED ORDER — SODIUM CHLORIDE 0.9 % IV SOLN: Freq: Once | INTRAVENOUS | Status: AC

## 2021-12-27 MED ORDER — SODIUM CHLORIDE 0.9 % IV SOLN
4.0000 mg | Freq: Once | INTRAVENOUS | Status: AC
  Administered 2021-12-27: 4 mg via INTRAVENOUS
  Filled 2021-12-27: qty 0.4

## 2021-12-27 MED ORDER — DEXTROSE 5 % IV SOLN
56.0000 mg/m2 | Freq: Once | INTRAVENOUS | Status: AC
  Administered 2021-12-27: 120 mg via INTRAVENOUS
  Filled 2021-12-27: qty 60

## 2021-12-27 MED ORDER — SODIUM CHLORIDE 0.9 % IV SOLN: Freq: Once | INTRAVENOUS | Status: DC

## 2021-12-27 MED ORDER — ACETAMINOPHEN 500 MG PO TABS
1000.0000 mg | ORAL_TABLET | Freq: Once | ORAL | Status: AC
  Administered 2021-12-27: 1000 mg via ORAL
  Filled 2021-12-27: qty 2

## 2021-12-27 MED ORDER — HEPARIN SOD (PORK) LOCK FLUSH 100 UNIT/ML IV SOLN
500.0000 [IU] | Freq: Once | INTRAVENOUS | Status: AC | PRN
  Administered 2021-12-27: 500 [IU]

## 2021-12-27 MED ORDER — PROCHLORPERAZINE MALEATE 10 MG PO TABS
10.0000 mg | ORAL_TABLET | Freq: Once | ORAL | Status: AC
  Administered 2021-12-27: 10 mg via ORAL
  Filled 2021-12-27: qty 1

## 2021-12-27 MED ORDER — SODIUM CHLORIDE 0.9% FLUSH
10.0000 mL | INTRAVENOUS | Status: DC | PRN
  Administered 2021-12-27: 10 mL

## 2021-12-27 MED ORDER — FLUCONAZOLE 100 MG PO TABS: 100.0000 mg | ORAL_TABLET | Freq: Every day | ORAL | 0 refills | Status: DC

## 2021-12-27 MED ORDER — SODIUM CHLORIDE 0.9% FLUSH
10.0000 mL | Freq: Once | INTRAVENOUS | Status: AC
  Administered 2021-12-27: 10 mL

## 2021-12-27 MED ORDER — LIDOCAINE-PRILOCAINE 2.5-2.5 % EX CREA: 1.0000 "application " | TOPICAL_CREAM | CUTANEOUS | 2 refills | Status: AC | PRN

## 2021-12-28 LAB — KAPPA/LAMBDA LIGHT CHAINS
Kappa free light chain: 12.9 mg/L (ref 3.3–19.4)
Kappa, lambda light chain ratio: 1.36 (ref 0.26–1.65)
Lambda free light chains: 9.5 mg/L (ref 5.7–26.3)

## 2021-12-29 LAB — MULTIPLE MYELOMA PANEL, SERUM
Albumin SerPl Elph-Mcnc: 3.6 g/dL (ref 2.9–4.4)
Albumin/Glob SerPl: 1.6 (ref 0.7–1.7)
Alpha 1: 0.2 g/dL (ref 0.0–0.4)
Alpha2 Glob SerPl Elph-Mcnc: 0.7 g/dL (ref 0.4–1.0)
B-Globulin SerPl Elph-Mcnc: 0.9 g/dL (ref 0.7–1.3)
Gamma Glob SerPl Elph-Mcnc: 0.6 g/dL (ref 0.4–1.8)
Globulin, Total: 2.4 g/dL (ref 2.2–3.9)
IgA: 25 mg/dL — ABNORMAL LOW (ref 87–352)
IgG (Immunoglobin G), Serum: 593 mg/dL (ref 586–1602)
IgM (Immunoglobulin M), Srm: 14 mg/dL — ABNORMAL LOW (ref 26–217)
Total Protein ELP: 6 g/dL (ref 6.0–8.5)

## 2022-01-02 ENCOUNTER — Encounter: Payer: Self-pay | Admitting: Hematology

## 2022-01-02 DIAGNOSIS — R030 Elevated blood-pressure reading, without diagnosis of hypertension: Secondary | ICD-10-CM | POA: Diagnosis not present

## 2022-01-02 DIAGNOSIS — Z96642 Presence of left artificial hip joint: Secondary | ICD-10-CM | POA: Diagnosis not present

## 2022-01-02 DIAGNOSIS — C9001 Multiple myeloma in remission: Secondary | ICD-10-CM | POA: Diagnosis not present

## 2022-01-02 DIAGNOSIS — Z13820 Encounter for screening for osteoporosis: Secondary | ICD-10-CM | POA: Diagnosis not present

## 2022-01-02 DIAGNOSIS — E039 Hypothyroidism, unspecified: Secondary | ICD-10-CM | POA: Diagnosis not present

## 2022-01-02 DIAGNOSIS — E782 Mixed hyperlipidemia: Secondary | ICD-10-CM | POA: Diagnosis not present

## 2022-01-02 DIAGNOSIS — D649 Anemia, unspecified: Secondary | ICD-10-CM | POA: Diagnosis not present

## 2022-01-02 NOTE — Progress Notes (Signed)
? ? ? ?HEMATOLOGY/ONCOLOGY CLINIC NOTE ? ?Date of Service: .12/27/2021 ? ?PCP: Marzetta Board MD ? ?CHIEF COMPLAINTS/PURPOSE OF CONSULTATION:  ?Follow-up for continued evaluation and management of multiple myeloma ? ?HISTORY OF PRESENTING ILLNESS:  ? ?Madison Parker is a wonderful 71 y.o. 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 ? ?Patient has a history of smoldering multiple myeloma which she reports she was diagnosed with in 2011 when she was evaluated by a hematologist in [facility]. She reports that she presented with low blood counts (low WBC)  ? and after significant workup she had a bone marrow examination based on which she was told that she has smoldering multiple myeloma. Patient notes that she had a bone survey at that time which was negative. She was monitored there closely and then moved to [facility] in 2015 to stay with her son whose wife was being treated for breast cancer. ?Patient was following with Dr. Delight Hoh in [place]. She reports not having had any treatment for multiple myeloma. ? ?Her last labs from about a year ago showed a SPEP with no OBSERVED M spike . Serum free light chains showed an elevation of kappa Free light chain 310 and lambda free light chain of 12.7 with an abnormal Kappa/ Lambda ratio of 24.38 ( up from 19 previously). Random urine showed an M protein complement of 44%. ? ?She lives in [place] and requested transfer of care to [place]. She was offered follow-up but [facility] in [place]  But prefers to  follow [place] in [place]. ?Patient reports her energy levels been stable. ?She has chronic back pain which has improved since the patient had her spinal surgery in April 2017 (L4-L5 interbody fusion). ?Patient notes she has had some chronic left hip pain which is somewhat more bothersome with the navy on the lateral aspect of her upper thighs that's painful.  She also notes some right hip pain. Over the last few weeks he notes some upper neck pain especially when bending her neck backwards and tingling numbness in her right upper extremity. ? ?Has been working on losing some weight voluntarily.  ?No acute other new symptoms. ? ?AUTOLOGOUS STEM CELL TRANSPLANT: ?? Disease status at time of transplant: sCR MRD positive ?? ASBMT risk stratification: low risk, ECOG/KPS: 0 / 90%, HSCT-CI score: 0 ?? Started mobilization on 09/03/19 using GCS-F and Plerixafor. She collected stem cells on 09/07/19. The pre-processing total was 7.18x10(6) CD34+ cells/kg. The post-processing total was 1.11x10(7) CD34+ cells/kg frozen in 3 bags. ?? Preparative regimen with Melphalan 200 mg/m2 on 09/14/19 (outpatient). ?? Infusion of stem cells - 7.4 x 10(6) on 09/15/19 (outpatient) ?? Admitted Day +8 for neutropenic fever, uncontrolled n/v, diarrhea ?? Treated with short course of prednisone for peri-engraftment syndrome ? ?TREATMENT: ?? Started induction therapy with carfilozomib, lenlaidomide, and dexamethasone 28 day cycle on 04/27/2019. She had a great response to initial cycle. She developed a DVT on 06/24/19 and was started on apixaban as an anticoagulant. She had normalization of her light chains by the start of C4 on 07/28/19. ?? She was referred to the myeloma clinic at [facility] for BMT consult and seen on 08/10/19. Serology from her visit showed no M-spike by SPEP, a free light chain ratio of 1.07 (kappa 11.42, lambda 10.65), and an abnormal beta-2-micro of 3.02. She was started on pre-transplant evaluation while undergoing C5 of therapy. ?? Treatment discontinued in 07/2019 in preparation  for autologous stem cell transplant. ? ? ?INTERVAL HISTORY:  ? ?Madison Parker is here for continued valuation and management of her multiple myeloma. ?She notes chronic hip and lower back pains related to her arthritis and degenerative disc disease. ?No other acute new symptoms. ?Still notes some Candida  intertrigo in the groin and prescribed fluconazole again. ?Notes that she is having difficulties losing weight as planned and would like to see nutritional therapist.  Referral placed. ? ?No infection issues since her last clinic visit. ?Labs done today were reviewed in details. ? ?MEDICAL HISTORY:  ?Past Medical History:  ?Diagnosis Date  ? Headache   ? Low back pain   ? SBO (small bowel obstruction) ([place] [facility]) 2010  ? Smoldering multiple myeloma   ?Previous history of hypothyroidism- not currently medications ?Anxiety ?Obesity .Body mass index is 38.47 kg/m?. ?Vitamin D deficiency ?Smoldering multiple myeloma diagnosed in 2011 ?Lumbosacral radiculopathy ?Left hip pain ? ?SURGICAL HISTORY: ?Past Surgical History:  ?Procedure Laterality Date  ? ABDOMINAL HYSTERECTOMY    ? complete  ? BACK SURGERY    ? lower at [facility]  ? COLONOSCOPY WITH PROPOFOL N/A 08/23/2020  ? Procedure: COLONOSCOPY WITH PROPOFOL;  Surgeon: Harvel Quale, MD;  Location: [facility];  Service: Gastroenterology;  Laterality: N/A;  900  ? colonscopy  2014  ? GASTRIC BYPASS  yrs ago  ? HERNIA REPAIR    ? IR IMAGING GUIDED PORT INSERTION  04/20/2019  ? KNEE SURGERY  06/04/2009  ? both knees replaced   ? POLYPECTOMY  08/23/2020  ? Procedure: POLYPECTOMY;  Surgeon: Harvel Quale, MD;  Location: [facility];  Service: Gastroenterology;;  ? TONSILLECTOMY  age 35  ? TOTAL HIP ARTHROPLASTY Left 08/16/2017  ? Procedure: LEFT TOTAL HIP ARTHROPLASTY ANTERIOR APPROACH;  Surgeon: Dorna Leitz, MD;  Location: [facility];  Service: Orthopedics;  Laterality: Left;  ?Spinal surgery April 2017 ? ?SOCIAL HISTORY: ?Social History  ? ?Socioeconomic History  ? Marital status: Married  ?  Spouse name: Not on file  ? Number of children: Not on file  ? Years of education: Not on file  ? Highest education level: Not on file  ?Occupational History  ? Not on file  ?Tobacco Use  ? Smoking status: Former  ?  Packs/day: 0.50  ?  Years: 10.00  ?  Pack years: 5.00  ?   Types: Cigarettes  ? Smokeless tobacco: Never  ? Tobacco comments:  ?  quit 1977  ?Vaping Use  ? Vaping Use: Never used  ?Substance and Sexual Activity  ? Alcohol use: Yes  ?  Comment: occ  ? Drug use: No  ? Sexual activity: Not on file  ?Other Topics Concern  ? Not on file  ?Social History Narrative  ? Not on file  ? ?Social Determinants of Health  ? ?Financial Resource Strain: Not on file  ?Food Insecurity: Not on file  ?Transportation Needs: Not on file  ?Physical Activity: Not on file  ?Stress: Not on file  ?Social Connections: Not on file  ?Intimate Partner Violence: Not At Risk  ? Fear of Current or Ex-Partner: No  ? Emotionally Abused: No  ? Physically Abused: No  ? Sexually Abused: No  ?Occasional alcohol use ?Former smoker and smoked 1 pack per week for about 9 years has since quit. ? ?FAMILY HISTORY: ?Family History  ?Problem Relation Age of Onset  ? Breast cancer Neg Hx   ? ? ?ALLERGIES:  is allergic to codeine. ? ?MEDICATIONS:  ?Current Outpatient  Medications  ?Medication Sig Dispense Refill  ? fluconazole (DIFLUCAN) 100 MG tablet Take 1 tablet (100 mg total) by mouth daily. 10 tablet 0  ? acyclovir (ZOVIRAX) 800 MG tablet Take 0.5 tablets (400 mg total) by mouth 2 (two) times daily. 60 tablet 11  ? amoxicillin (AMOXIL) 500 MG tablet Take by mouth. (Patient not taking: Reported on 12/27/2021)    ? Calcium Carbonate-Vitamin D 600-200 MG-UNIT TABS Calcium 600 with Vitamin D3 600 mg-5 mcg (200 unit) tablet ? Take 1 tablet every day by oral route as directed.    ? Cholecalciferol 1.25 MG (50000 UT) TABS Take by mouth.    ? FOLIC ACID PO Take 4,784 mg by mouth daily.     ? lidocaine-prilocaine (EMLA) cream Apply 1 application. topically as needed. 30 g 2  ? NYSTATIN powder Apply topically 2 (two) times daily. (Patient not taking: Reported on 12/27/2021) 15 g 1  ? nystatin-triamcinolone (MYCOLOG II) cream Apply topically 2 (two) times daily. (Patient not taking: Reported on 12/27/2021) 30 g 1  ? Probiotic  Product (PROBIOTIC DAILY PO) Take 2 capsules by mouth daily. Gummies    ? vitamin B-12 (CYANOCOBALAMIN) 1000 MCG tablet Take 1,000 mcg by mouth daily.     ? Vitamin D, Ergocalciferol, (DRISDOL) 1.25 MG (50000

## 2022-01-09 ENCOUNTER — Other Ambulatory Visit: Payer: Self-pay | Admitting: *Deleted

## 2022-01-09 DIAGNOSIS — C9001 Multiple myeloma in remission: Secondary | ICD-10-CM

## 2022-01-10 ENCOUNTER — Other Ambulatory Visit: Payer: Self-pay | Admitting: Hematology

## 2022-01-10 ENCOUNTER — Inpatient Hospital Stay: Payer: Medicare Other

## 2022-01-10 ENCOUNTER — Other Ambulatory Visit: Payer: Self-pay

## 2022-01-10 ENCOUNTER — Ambulatory Visit: Payer: TRICARE For Life (TFL)

## 2022-01-10 VITALS — BP 127/68 | HR 79 | Temp 97.8°F | Resp 18 | Wt 203.8 lb

## 2022-01-10 DIAGNOSIS — Z7189 Other specified counseling: Secondary | ICD-10-CM

## 2022-01-10 DIAGNOSIS — Z95828 Presence of other vascular implants and grafts: Secondary | ICD-10-CM

## 2022-01-10 DIAGNOSIS — C9001 Multiple myeloma in remission: Secondary | ICD-10-CM

## 2022-01-10 DIAGNOSIS — C9000 Multiple myeloma not having achieved remission: Secondary | ICD-10-CM

## 2022-01-10 DIAGNOSIS — Z5112 Encounter for antineoplastic immunotherapy: Secondary | ICD-10-CM | POA: Diagnosis not present

## 2022-01-10 LAB — CBC WITH DIFFERENTIAL (CANCER CENTER ONLY)
Abs Immature Granulocytes: 0.01 10*3/uL (ref 0.00–0.07)
Basophils Absolute: 0 10*3/uL (ref 0.0–0.1)
Basophils Relative: 1 %
Eosinophils Absolute: 0.1 10*3/uL (ref 0.0–0.5)
Eosinophils Relative: 2 %
HCT: 32.2 % — ABNORMAL LOW (ref 36.0–46.0)
Hemoglobin: 10.7 g/dL — ABNORMAL LOW (ref 12.0–15.0)
Immature Granulocytes: 0 %
Lymphocytes Relative: 40 %
Lymphs Abs: 1.5 10*3/uL (ref 0.7–4.0)
MCH: 31.7 pg (ref 26.0–34.0)
MCHC: 33.2 g/dL (ref 30.0–36.0)
MCV: 95.3 fL (ref 80.0–100.0)
Monocytes Absolute: 0.4 10*3/uL (ref 0.1–1.0)
Monocytes Relative: 11 %
Neutro Abs: 1.7 10*3/uL (ref 1.7–7.7)
Neutrophils Relative %: 46 %
Platelet Count: 199 10*3/uL (ref 150–400)
RBC: 3.38 MIL/uL — ABNORMAL LOW (ref 3.87–5.11)
RDW: 14.1 % (ref 11.5–15.5)
WBC Count: 3.7 10*3/uL — ABNORMAL LOW (ref 4.0–10.5)
nRBC: 0 % (ref 0.0–0.2)

## 2022-01-10 LAB — BASIC METABOLIC PANEL
Anion gap: 6 (ref 5–15)
BUN: 13 mg/dL (ref 8–23)
CO2: 22 mmol/L (ref 22–32)
Calcium: 8.1 mg/dL — ABNORMAL LOW (ref 8.9–10.3)
Chloride: 113 mmol/L — ABNORMAL HIGH (ref 98–111)
Creatinine, Ser: 0.79 mg/dL (ref 0.44–1.00)
GFR, Estimated: >60 mL/min (ref >60)
Glucose, Bld: 103 mg/dL — ABNORMAL HIGH (ref 70–99)
Potassium: 3.6 mmol/L (ref 3.5–5.1)
Sodium: 141 mmol/L (ref 135–145)

## 2022-01-10 MED ORDER — DEXTROSE 5 % IV SOLN
56.0000 mg/m2 | Freq: Once | INTRAVENOUS | Status: AC
  Administered 2022-01-10: 120 mg via INTRAVENOUS
  Filled 2022-01-10: qty 60

## 2022-01-10 MED ORDER — SODIUM CHLORIDE 0.9 % IV SOLN: Freq: Once | INTRAVENOUS | Status: AC

## 2022-01-10 MED ORDER — PROCHLORPERAZINE MALEATE 10 MG PO TABS
10.0000 mg | ORAL_TABLET | Freq: Once | ORAL | Status: AC
  Administered 2022-01-10: 10 mg via ORAL
  Filled 2022-01-10: qty 1

## 2022-01-10 MED ORDER — HEPARIN SOD (PORK) LOCK FLUSH 100 UNIT/ML IV SOLN
500.0000 [IU] | Freq: Once | INTRAVENOUS | Status: AC | PRN
  Administered 2022-01-10: 500 [IU]

## 2022-01-10 MED ORDER — ZOLEDRONIC ACID 4 MG/100ML IV SOLN
4.0000 mg | Freq: Once | INTRAVENOUS | Status: AC
  Administered 2022-01-10: 4 mg via INTRAVENOUS
  Filled 2022-01-10: qty 100

## 2022-01-10 MED ORDER — SODIUM CHLORIDE 0.9% FLUSH
10.0000 mL | Freq: Once | INTRAVENOUS | Status: AC
  Administered 2022-01-10: 10 mL

## 2022-01-10 MED ORDER — SODIUM CHLORIDE 0.9 % IV SOLN
4.0000 mg | Freq: Once | INTRAVENOUS | Status: AC
  Administered 2022-01-10: 4 mg via INTRAVENOUS
  Filled 2022-01-10: qty 0.4

## 2022-01-10 MED ORDER — ACETAMINOPHEN 500 MG PO TABS
1000.0000 mg | ORAL_TABLET | Freq: Once | ORAL | Status: AC
  Administered 2022-01-10: 1000 mg via ORAL
  Filled 2022-01-10: qty 2

## 2022-01-10 MED ORDER — SODIUM CHLORIDE 0.9% FLUSH
10.0000 mL | INTRAVENOUS | Status: DC | PRN
  Administered 2022-01-10: 10 mL

## 2022-01-10 NOTE — Patient Instructions (Signed)
[facility]  Discharge Instructions: Thank you for choosing [facility] to provide your oncology and hematology care.   If you have a lab appointment with the Cancer Center, please go directly to the Cancer Center and check in at the registration area.   Wear comfortable clothing and clothing appropriate for easy access to any Portacath or PICC line.   We strive to give you quality time with your provider. You may need to reschedule your appointment if you arrive late (15 or more minutes).  Arriving late affects you and other patients whose appointments are after yours.  Also, if you miss three or more appointments without notifying the office, you may be dismissed from the clinic at the provider's discretion.      For prescription refill requests, have your pharmacy contact our office and allow 72 hours for refills to be completed.    Today you received the following chemotherapy and/or immunotherapy agents: Carfilzomib.      To help prevent nausea and vomiting after your treatment, we encourage you to take your nausea medication as directed.  BELOW ARE SYMPTOMS THAT SHOULD BE REPORTED IMMEDIATELY: *FEVER GREATER THAN 100.4 F (38 C) OR HIGHER *CHILLS OR SWEATING *NAUSEA AND VOMITING THAT IS NOT CONTROLLED WITH YOUR NAUSEA MEDICATION *UNUSUAL SHORTNESS OF BREATH *UNUSUAL BRUISING OR BLEEDING *URINARY PROBLEMS (pain or burning when urinating, or frequent urination) *BOWEL PROBLEMS (unusual diarrhea, constipation, pain near the anus) TENDERNESS IN MOUTH AND THROAT WITH OR WITHOUT PRESENCE OF ULCERS (sore throat, sores in mouth, or a toothache) UNUSUAL RASH, SWELLING OR PAIN  UNUSUAL VAGINAL DISCHARGE OR ITCHING   Items with * indicate a potential emergency and should be followed up as soon as possible or go to the Emergency Department if any problems should occur.  Please show the CHEMOTHERAPY ALERT CARD or IMMUNOTHERAPY ALERT CARD at check-in  to the Emergency Department and triage nurse.  Should you have questions after your visit or need to cancel or reschedule your appointment, please contact [facility]  Dept: [phone]  and follow the prompts.  Office hours are 8:00 a.m. to 4:30 p.m. Monday - Friday. Please note that voicemails left after 4:00 p.m. may not be returned until the following business day.  We are closed weekends and major holidays. You have access to a nurse at all times for urgent questions. Please call the main number to the clinic Dept: [phone] and follow the prompts.   For any non-urgent questions, you may also contact your provider using MyChart. We now offer e-Visits for anyone 18 and older to request care online for non-urgent symptoms. For details visit [email or web address].   Also download the MyChart app! Go to the app store, search "MyChart", open the app, select [facility], and log in with your MyChart username and password.  Due to Covid, a mask is required upon entering the hospital/clinic. If you do not have a mask, one will be given to you upon arrival. For doctor visits, patients may have 1 support Bahar Shelden aged 18 or older with them. For treatment visits, patients cannot have anyone with them due to current Covid guidelines and our immunocompromised population.   

## 2022-01-10 NOTE — Progress Notes (Signed)
Per Dr. Irene Limbo, okay to treat with zometa today with Ca 8.1. ?

## 2022-01-19 ENCOUNTER — Other Ambulatory Visit: Payer: Self-pay | Admitting: Hematology

## 2022-01-22 ENCOUNTER — Other Ambulatory Visit: Payer: Self-pay | Admitting: *Deleted

## 2022-01-22 DIAGNOSIS — C9000 Multiple myeloma not having achieved remission: Secondary | ICD-10-CM

## 2022-01-24 ENCOUNTER — Inpatient Hospital Stay: Payer: Medicare Other | Attending: Hematology

## 2022-01-24 ENCOUNTER — Inpatient Hospital Stay: Payer: Medicare Other | Admitting: Dietician

## 2022-01-24 ENCOUNTER — Inpatient Hospital Stay: Payer: Medicare Other

## 2022-01-24 ENCOUNTER — Other Ambulatory Visit: Payer: Self-pay

## 2022-01-24 VITALS — BP 147/78 | HR 74 | Temp 98.1°F | Resp 18 | Wt 201.5 lb

## 2022-01-24 DIAGNOSIS — C9000 Multiple myeloma not having achieved remission: Secondary | ICD-10-CM | POA: Diagnosis not present

## 2022-01-24 DIAGNOSIS — Z7189 Other specified counseling: Secondary | ICD-10-CM

## 2022-01-24 DIAGNOSIS — Z95828 Presence of other vascular implants and grafts: Secondary | ICD-10-CM

## 2022-01-24 DIAGNOSIS — Z5112 Encounter for antineoplastic immunotherapy: Secondary | ICD-10-CM | POA: Diagnosis not present

## 2022-01-24 LAB — CMP (CANCER CENTER ONLY)
ALT: 19 U/L (ref 0–44)
AST: 22 U/L (ref 15–41)
Albumin: 4.1 g/dL (ref 3.5–5.0)
Alkaline Phosphatase: 60 U/L (ref 38–126)
Anion gap: 7 (ref 5–15)
BUN: 18 mg/dL (ref 8–23)
CO2: 24 mmol/L (ref 22–32)
Calcium: 9.3 mg/dL (ref 8.9–10.3)
Chloride: 107 mmol/L (ref 98–111)
Creatinine: 0.86 mg/dL (ref 0.44–1.00)
GFR, Estimated: >60 mL/min (ref >60)
Glucose, Bld: 104 mg/dL — ABNORMAL HIGH (ref 70–99)
Potassium: 3.9 mmol/L (ref 3.5–5.1)
Sodium: 138 mmol/L (ref 135–145)
Total Bilirubin: 0.9 mg/dL (ref 0.3–1.2)
Total Protein: 6.7 g/dL (ref 6.5–8.1)

## 2022-01-24 LAB — CBC WITH DIFFERENTIAL (CANCER CENTER ONLY)
Abs Immature Granulocytes: 0.01 10*3/uL (ref 0.00–0.07)
Basophils Absolute: 0 10*3/uL (ref 0.0–0.1)
Basophils Relative: 1 %
Eosinophils Absolute: 0.1 10*3/uL (ref 0.0–0.5)
Eosinophils Relative: 1 %
HCT: 32.3 % — ABNORMAL LOW (ref 36.0–46.0)
Hemoglobin: 10.8 g/dL — ABNORMAL LOW (ref 12.0–15.0)
Immature Granulocytes: 0 %
Lymphocytes Relative: 47 %
Lymphs Abs: 1.7 10*3/uL (ref 0.7–4.0)
MCH: 31.3 pg (ref 26.0–34.0)
MCHC: 33.4 g/dL (ref 30.0–36.0)
MCV: 93.6 fL (ref 80.0–100.0)
Monocytes Absolute: 0.3 10*3/uL (ref 0.1–1.0)
Monocytes Relative: 9 %
Neutro Abs: 1.5 10*3/uL — ABNORMAL LOW (ref 1.7–7.7)
Neutrophils Relative %: 42 %
Platelet Count: 191 10*3/uL (ref 150–400)
RBC: 3.45 MIL/uL — ABNORMAL LOW (ref 3.87–5.11)
RDW: 14.2 % (ref 11.5–15.5)
WBC Count: 3.6 10*3/uL — ABNORMAL LOW (ref 4.0–10.5)
nRBC: 0 % (ref 0.0–0.2)

## 2022-01-24 MED ORDER — SODIUM CHLORIDE 0.9 % IV SOLN: Freq: Once | INTRAVENOUS | Status: DC

## 2022-01-24 MED ORDER — PROCHLORPERAZINE MALEATE 10 MG PO TABS
10.0000 mg | ORAL_TABLET | Freq: Once | ORAL | Status: AC
  Administered 2022-01-24: 10 mg via ORAL
  Filled 2022-01-24: qty 1

## 2022-01-24 MED ORDER — SODIUM CHLORIDE 0.9 % IV SOLN: Freq: Once | INTRAVENOUS | Status: AC

## 2022-01-24 MED ORDER — ACETAMINOPHEN 500 MG PO TABS
1000.0000 mg | ORAL_TABLET | Freq: Once | ORAL | Status: AC
  Administered 2022-01-24: 1000 mg via ORAL
  Filled 2022-01-24: qty 2

## 2022-01-24 MED ORDER — SODIUM CHLORIDE 0.9 % IV SOLN
4.0000 mg | Freq: Once | INTRAVENOUS | Status: AC
  Administered 2022-01-24: 4 mg via INTRAVENOUS
  Filled 2022-01-24: qty 0.4

## 2022-01-24 MED ORDER — SODIUM CHLORIDE 0.9% FLUSH
10.0000 mL | Freq: Once | INTRAVENOUS | Status: AC
  Administered 2022-01-24: 10 mL

## 2022-01-24 MED ORDER — DEXTROSE 5 % IV SOLN
56.0000 mg/m2 | Freq: Once | INTRAVENOUS | Status: AC
  Administered 2022-01-24: 120 mg via INTRAVENOUS
  Filled 2022-01-24: qty 60

## 2022-01-24 NOTE — Progress Notes (Signed)
Nutrition Assessment ? ? ?Reason for Assessment: Provider referral ? ? ?ASSESSMENT: 71 year old female with smoldering multiple myeloma plus progressive anemia. She is receiving kyprolis q28d and zometa q12. Patient is under the care of Dr. Kale. ? ?Past medical history includes SBO (2010), anxiety, vit D deficiency, lumbosacral radiculopathy, gastric bypass (~2005 per pt) ? ?Met with patient during infusion. She reports her appetite goes up and down. Patient endorses strong appetite before and a few days after treatment due to decadron. Other days she is not interested in eating. Patient reports weights are reflective and experiencing undesired swings in body weight. She would prefer to be under 200 lbs. This would help decrease her back pain. Patient reports she has been craving carbs. Normally she does not crave sweets. Patient recalls eating 2-3 smaller meals. She tries to avoid eating after 7 PM, sometimes she will eat popcorn. Patient does not drink milk. She likes eggs, cheese, nuts, beans. She is drinking ~60 ounces of water and protein shake. Patient has h/o roux-en-y. Patient is not taking MVI or folic acid. Patient has not been seen by bariatric clinic in many years. She has not been sleeping well secondary to having cramps in her foot. She reports MD has recommended potassium. Patient is trying to stay active when at home by walking her stairs. She denies nausea, vomiting, diarrhea, constipation.  ? ?Nutrition Focused Physical Exam:  ? ?Orbital Region: Mild ?Dorsal Hand: mild, palms pale ?Posterior Calf Region: moderate ?Eyes: pale conjunctiva, corneal arcus ?Skin: reviewed ?Nails: painted  ? ? ?Medications: acyclovir, B12, Ca 600/D3, Drisdol, diflucan ? ? ?Labs: Glucose 104 ? ? ?Anthropometrics:  ? ?Height: 5'1" ?Weight: 201 lb 8 oz ?UBW: 190-200 lb (last 12 months) ?BMI: 38.07 ? ? ?NUTRITION DIAGNOSIS: Food and nutrition related knowledge deficit related to cancer and associated treatments as evidenced  by no prior need for related nutrition information ? ? ?INTERVENTION:  ?Educated on importance of adequate calories and protein to maintain strength/weight   ?Encouraged small frequent meals and snacks with good sources of lean proteins - handout with ideas provided  ?Continue drinking protein shake ?Encouraged activity as able  ?Patient is not taking MVI, per ASMBS guidelines recommend running labs to identify possible nutrient deficiencies (thiamine, B12, folate, iron, 25(OH)D, Ca, vit A,E,K, zinc, copper) - message sent to MD  ? ? ?MONITORING, EVALUATION, GOAL: Patient will tolerate adequate calories and protein to minimize weight loss  ? ? ?Next Visit: Wednesday May 10 during infusion  ? ? ? ? ? ? ?

## 2022-01-24 NOTE — Patient Instructions (Signed)
[facility]  Discharge Instructions: Thank you for choosing [facility] to provide your oncology and hematology care.   If you have a lab appointment with the Cancer Center, please go directly to the Cancer Center and check in at the registration area.   Wear comfortable clothing and clothing appropriate for easy access to any Portacath or PICC line.   We strive to give you quality time with your provider. You may need to reschedule your appointment if you arrive late (15 or more minutes).  Arriving late affects you and other patients whose appointments are after yours.  Also, if you miss three or more appointments without notifying the office, you may be dismissed from the clinic at the provider's discretion.      For prescription refill requests, have your pharmacy contact our office and allow 72 hours for refills to be completed.    Today you received the following chemotherapy and/or immunotherapy agents: Carfilzomib.      To help prevent nausea and vomiting after your treatment, we encourage you to take your nausea medication as directed.  BELOW ARE SYMPTOMS THAT SHOULD BE REPORTED IMMEDIATELY: *FEVER GREATER THAN 100.4 F (38 C) OR HIGHER *CHILLS OR SWEATING *NAUSEA AND VOMITING THAT IS NOT CONTROLLED WITH YOUR NAUSEA MEDICATION *UNUSUAL SHORTNESS OF BREATH *UNUSUAL BRUISING OR BLEEDING *URINARY PROBLEMS (pain or burning when urinating, or frequent urination) *BOWEL PROBLEMS (unusual diarrhea, constipation, pain near the anus) TENDERNESS IN MOUTH AND THROAT WITH OR WITHOUT PRESENCE OF ULCERS (sore throat, sores in mouth, or a toothache) UNUSUAL RASH, SWELLING OR PAIN  UNUSUAL VAGINAL DISCHARGE OR ITCHING   Items with * indicate a potential emergency and should be followed up as soon as possible or go to the Emergency Department if any problems should occur.  Please show the CHEMOTHERAPY ALERT CARD or IMMUNOTHERAPY ALERT CARD at check-in  to the Emergency Department and triage nurse.  Should you have questions after your visit or need to cancel or reschedule your appointment, please contact [facility]  Dept: [phone]  and follow the prompts.  Office hours are 8:00 a.m. to 4:30 p.m. Monday - Friday. Please note that voicemails left after 4:00 p.m. may not be returned until the following business day.  We are closed weekends and major holidays. You have access to a nurse at all times for urgent questions. Please call the main number to the clinic Dept: [phone] and follow the prompts.   For any non-urgent questions, you may also contact your provider using MyChart. We now offer e-Visits for anyone 18 and older to request care online for non-urgent symptoms. For details visit [email or web address].   Also download the MyChart app! Go to the app store, search "MyChart", open the app, select [place], and log in with your MyChart username and password.  Due to Covid, a mask is required upon entering the hospital/clinic. If you do not have a mask, one will be given to you upon arrival. For doctor visits, patients may have 1 support person aged 18 or older with them. For treatment visits, patients cannot have anyone with them due to current Covid guidelines and our immunocompromised population.   

## 2022-01-26 LAB — MULTIPLE MYELOMA PANEL, SERUM
Albumin SerPl Elph-Mcnc: 3.9 g/dL (ref 2.9–4.4)
Albumin/Glob SerPl: 1.7 (ref 0.7–1.7)
Alpha 1: 0.1 g/dL (ref 0.0–0.4)
Alpha2 Glob SerPl Elph-Mcnc: 0.8 g/dL (ref 0.4–1.0)
B-Globulin SerPl Elph-Mcnc: 0.9 g/dL (ref 0.7–1.3)
Gamma Glob SerPl Elph-Mcnc: 0.5 g/dL (ref 0.4–1.8)
Globulin, Total: 2.3 g/dL (ref 2.2–3.9)
IgA: 29 mg/dL — ABNORMAL LOW (ref 87–352)
IgG (Immunoglobin G), Serum: 621 mg/dL (ref 586–1602)
IgM (Immunoglobulin M), Srm: 18 mg/dL — ABNORMAL LOW (ref 26–217)
Total Protein ELP: 6.2 g/dL (ref 6.0–8.5)

## 2022-01-29 DIAGNOSIS — Z20822 Contact with and (suspected) exposure to covid-19: Secondary | ICD-10-CM | POA: Diagnosis not present

## 2022-02-06 ENCOUNTER — Other Ambulatory Visit: Payer: Self-pay

## 2022-02-06 DIAGNOSIS — C9000 Multiple myeloma not having achieved remission: Secondary | ICD-10-CM

## 2022-02-07 ENCOUNTER — Inpatient Hospital Stay: Payer: Medicare Other

## 2022-02-07 ENCOUNTER — Other Ambulatory Visit: Payer: Self-pay

## 2022-02-07 ENCOUNTER — Inpatient Hospital Stay (HOSPITAL_BASED_OUTPATIENT_CLINIC_OR_DEPARTMENT_OTHER): Payer: Medicare Other | Admitting: Hematology

## 2022-02-07 VITALS — BP 153/77 | HR 65 | Temp 97.9°F | Resp 20 | Wt 206.9 lb

## 2022-02-07 DIAGNOSIS — Z5111 Encounter for antineoplastic chemotherapy: Secondary | ICD-10-CM

## 2022-02-07 DIAGNOSIS — C9000 Multiple myeloma not having achieved remission: Secondary | ICD-10-CM | POA: Diagnosis not present

## 2022-02-07 DIAGNOSIS — Z7189 Other specified counseling: Secondary | ICD-10-CM

## 2022-02-07 DIAGNOSIS — C9001 Multiple myeloma in remission: Secondary | ICD-10-CM

## 2022-02-07 DIAGNOSIS — Z95828 Presence of other vascular implants and grafts: Secondary | ICD-10-CM

## 2022-02-07 DIAGNOSIS — Z5112 Encounter for antineoplastic immunotherapy: Secondary | ICD-10-CM | POA: Diagnosis not present

## 2022-02-07 LAB — CBC WITH DIFFERENTIAL (CANCER CENTER ONLY)
Abs Immature Granulocytes: 0.01 10*3/uL (ref 0.00–0.07)
Basophils Absolute: 0 10*3/uL (ref 0.0–0.1)
Basophils Relative: 1 %
Eosinophils Absolute: 0 10*3/uL (ref 0.0–0.5)
Eosinophils Relative: 1 %
HCT: 31 % — ABNORMAL LOW (ref 36.0–46.0)
Hemoglobin: 10 g/dL — ABNORMAL LOW (ref 12.0–15.0)
Immature Granulocytes: 0 %
Lymphocytes Relative: 39 %
Lymphs Abs: 1.6 10*3/uL (ref 0.7–4.0)
MCH: 30.7 pg (ref 26.0–34.0)
MCHC: 32.3 g/dL (ref 30.0–36.0)
MCV: 95.1 fL (ref 80.0–100.0)
Monocytes Absolute: 0.5 10*3/uL (ref 0.1–1.0)
Monocytes Relative: 11 %
Neutro Abs: 1.9 10*3/uL (ref 1.7–7.7)
Neutrophils Relative %: 48 %
Platelet Count: 202 10*3/uL (ref 150–400)
RBC: 3.26 MIL/uL — ABNORMAL LOW (ref 3.87–5.11)
RDW: 14.5 % (ref 11.5–15.5)
WBC Count: 4.1 10*3/uL (ref 4.0–10.5)
nRBC: 0 % (ref 0.0–0.2)

## 2022-02-07 LAB — CMP (CANCER CENTER ONLY)
ALT: 18 U/L (ref 0–44)
AST: 19 U/L (ref 15–41)
Albumin: 3.9 g/dL (ref 3.5–5.0)
Alkaline Phosphatase: 54 U/L (ref 38–126)
Anion gap: 7 (ref 5–15)
BUN: 12 mg/dL (ref 8–23)
CO2: 24 mmol/L (ref 22–32)
Calcium: 8.7 mg/dL — ABNORMAL LOW (ref 8.9–10.3)
Chloride: 109 mmol/L (ref 98–111)
Creatinine: 0.78 mg/dL (ref 0.44–1.00)
GFR, Estimated: >60 mL/min (ref >60)
Glucose, Bld: 87 mg/dL (ref 70–99)
Potassium: 3.8 mmol/L (ref 3.5–5.1)
Sodium: 140 mmol/L (ref 135–145)
Total Bilirubin: 0.7 mg/dL (ref 0.3–1.2)
Total Protein: 6.2 g/dL — ABNORMAL LOW (ref 6.5–8.1)

## 2022-02-07 MED ORDER — PROCHLORPERAZINE MALEATE 10 MG PO TABS
10.0000 mg | ORAL_TABLET | Freq: Once | ORAL | Status: AC
  Administered 2022-02-07: 10 mg via ORAL
  Filled 2022-02-07: qty 1

## 2022-02-07 MED ORDER — SODIUM CHLORIDE 0.9% FLUSH
10.0000 mL | INTRAVENOUS | Status: DC | PRN
  Administered 2022-02-07: 10 mL

## 2022-02-07 MED ORDER — SODIUM CHLORIDE 0.9 % IV SOLN: Freq: Once | INTRAVENOUS | Status: AC

## 2022-02-07 MED ORDER — SODIUM CHLORIDE 0.9 % IV SOLN
4.0000 mg | Freq: Once | INTRAVENOUS | Status: AC
  Administered 2022-02-07: 4 mg via INTRAVENOUS
  Filled 2022-02-07: qty 0.4

## 2022-02-07 MED ORDER — HEPARIN SOD (PORK) LOCK FLUSH 100 UNIT/ML IV SOLN
500.0000 [IU] | Freq: Once | INTRAVENOUS | Status: AC | PRN
  Administered 2022-02-07: 500 [IU]

## 2022-02-07 MED ORDER — DEXTROSE 5 % IV SOLN
56.0000 mg/m2 | Freq: Once | INTRAVENOUS | Status: AC
  Administered 2022-02-07: 120 mg via INTRAVENOUS
  Filled 2022-02-07: qty 60

## 2022-02-07 MED ORDER — SODIUM CHLORIDE 0.9% FLUSH
10.0000 mL | Freq: Once | INTRAVENOUS | Status: AC
  Administered 2022-02-07: 10 mL

## 2022-02-07 MED ORDER — ACETAMINOPHEN 500 MG PO TABS
1000.0000 mg | ORAL_TABLET | Freq: Once | ORAL | Status: AC
  Administered 2022-02-07: 1000 mg via ORAL
  Filled 2022-02-07: qty 2

## 2022-02-07 NOTE — Progress Notes (Signed)
? ? ? ?HEMATOLOGY/ONCOLOGY CLINIC NOTE ? ?Date of Service: .02/07/2022 ? ?PCP: Marzetta Board MD ? ?CHIEF COMPLAINTS/PURPOSE OF CONSULTATION:  ?Follow-up for continued management of multiple myeloma ? ?HISTORY OF PRESENTING ILLNESS:  ?See previous note for details on initial presentation ? ?INTERVAL HISTORY:  ? ?[facility] is here for continued evaluation and management of multiple myeloma.  She notes no acute new symptoms since her last clinic visit. ?Unchanged somewhat improved intermittent chronic hip pains and back pains from her DJD. ?No new infection issues. ?No new focal bone pains. ?Reports concern for some weight gain over the last 6 months with nutritional therapy.  We discussed at length dietary changes that might be helpful. ?We also discussed other ways to be more physically active. ?She does note some itching in the groin without rash at this time.  She has been using topical antifungals and has been trying to keep the area dry with benefits.  We discussed using loose clothing and allowing the skin to breathe. ? ?MEDICAL HISTORY:  ?Past Medical History:  ?Diagnosis Date  ? Headache   ? Low back pain   ? SBO (small bowel obstruction) ([place]) 2010  ? Smoldering multiple myeloma   ?Previous history of hypothyroidism- not currently medications ?Anxiety ?Obesity .Body mass index is 39.09 kg/m?. 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 ?Lumbosacral radiculopathy ?Left hip pain ? ?SURGICAL HISTORY: ?Past Surgical History:  ?Procedure Laterality Date  ? ABDOMINAL HYSTERECTOMY    ? complete  ? BACK SURGERY    ? lower at [facility]  ? COLONOSCOPY WITH PROPOFOL N/A 08/23/2020  ? Procedure: COLONOSCOPY WITH PROPOFOL;  Surgeon: Harvel Quale, MD;  Location: [facility];  Service: Gastroenterology;  Laterality: N/A;  900  ? colonscopy  2014  ? GASTRIC BYPASS  yrs ago  ? HERNIA REPAIR    ? IR IMAGING GUIDED PORT INSERTION  04/20/2019  ? KNEE SURGERY   06/04/2009  ? both knees replaced   ? POLYPECTOMY  08/23/2020  ? Procedure: POLYPECTOMY;  Surgeon: Harvel Quale, MD;  Location: [facility];  Service: Gastroenterology;;  ? TONSILLECTOMY  age 6  ? TOTAL HIP ARTHROPLASTY Left 08/16/2017  ? Procedure: LEFT TOTAL HIP ARTHROPLASTY ANTERIOR APPROACH;  Surgeon: Dorna Leitz, MD;  Location: [facility];  Service: Orthopedics;  Laterality: Left;  ?Spinal surgery April 2017 ? ?SOCIAL HISTORY: ?Social History  ? ?Socioeconomic History  ? Marital status: Married  ?  Spouse name: Not on file  ? Number of children: Not on file  ? Years of education: Not on file  ? Highest education level: Not on file  ?Occupational History  ? Not on file  ?Tobacco Use  ? Smoking status: Former  ?  Packs/day: 0.50  ?  Years: 10.00  ?  Pack years: 5.00  ?  Types: Cigarettes  ? Smokeless tobacco: Never  ? Tobacco comments:  ?  quit 1977  ?Vaping Use  ? Vaping Use: Never used  ?Substance and Sexual Activity  ? Alcohol use: Yes  ?  Comment: occ  ? Drug use: No  ? Sexual activity: Not on file  ?Other Topics Concern  ? Not on file  ?Social History Narrative  ? Not on file  ? ?Social Determinants of Health  ? ?Financial Resource Strain: Not on file  ?Food Insecurity: Not on file  ?Transportation Needs: Not on file  ?Physical Activity: Not on file  ?Stress: Not on file  ?Social Connections: Not  on file  ?Intimate Partner Violence: Not At Risk  ? Fear of Current or Ex-Partner: No  ? Emotionally Abused: No  ? Physically Abused: No  ? Sexually Abused: No  ?Occasional alcohol use ?Former smoker and smoked 1 pack per week for about 9 years has since quit. ? ?FAMILY HISTORY: ?Family History  ?Problem Relation Age of Onset  ? Breast cancer Neg Hx   ? ? ?ALLERGIES:  is allergic to codeine. ? ?MEDICATIONS:  ?Current Outpatient Medications  ?Medication Sig Dispense Refill  ? acyclovir (ZOVIRAX) 800 MG tablet Take 0.5 tablets (400 mg total) by mouth 2 (two) times daily. 60 tablet 11  ? Calcium  Carbonate-Vitamin D 600-200 MG-UNIT TABS Calcium 600 with Vitamin D3 600 mg-5 mcg (200 unit) tablet ? Take 1 tablet every day by oral route as directed.    ? Cholecalciferol 1.25 MG (50000 UT) TABS Take by mouth.    ? fluconazole (DIFLUCAN) 100 MG tablet Take 1 tablet (100 mg total) by mouth daily. 10 tablet 0  ? FOLIC ACID PO Take 4,098 mg by mouth daily.     ? lidocaine-prilocaine (EMLA) cream Apply 1 application. topically as needed. 30 g 2  ? NYSTATIN powder Apply topically 2 (two) times daily. (Patient not taking: Reported on 12/27/2021) 15 g 1  ? Probiotic Product (PROBIOTIC DAILY PO) Take 2 capsules by mouth daily. Gummies    ? vitamin B-12 (CYANOCOBALAMIN) 1000 MCG tablet Take 1,000 mcg by mouth daily.     ? Vitamin D, Ergocalciferol, (DRISDOL) 1.25 MG (50000 UNIT) CAPS capsule TAKE 1 CAPSULE ONCE A WEEK (Patient taking differently: Take 50,000 Units by mouth every 7 (seven) days. Sunday) 13 capsule 3  ? ?No current facility-administered medications for this visit.  ? ?Facility-Administered Medications Ordered in Other Visits  ?Medication Dose Route Frequency Provider Last Rate Last Admin  ? 0.9 %  sodium chloride infusion   Intravenous Once Brunetta Genera, MD      ? carfilzomib (KYPROLIS) 120 mg in dextrose 5 % 100 mL chemo infusion  56 mg/m2 (Treatment Plan Recorded) Intravenous Once Brunetta Genera, MD      ? dexamethasone (DECADRON) 4 mg in sodium chloride 0.9 % 50 mL IVPB  4 mg Intravenous Once Brunetta Genera, MD      ? heparin lock flush 100 unit/mL  500 Units Intracatheter Once PRN Brunetta Genera, MD      ? sodium chloride flush (NS) 0.9 % injection 10 mL  10 mL Intracatheter PRN Brunetta Genera, MD   10 mL at 05/11/19 1437  ? sodium chloride flush (NS) 0.9 % injection 10 mL  10 mL Intracatheter PRN Brunetta Genera, MD      ? ? ?REVIEW OF SYSTEMS:  ?10 Point review of Systems was done is negative except as noted above. ? ?PHYSICAL EXAMINATION: ?ECOG FS:1 - Symptomatic  but completely ambulatory ? ?Vitals:  ? 02/07/22 0900  ?BP: (!) 153/77  ?Pulse: 65  ?Resp: 20  ?Temp: 97.9 ?F (36.6 ?C)  ?SpO2: 100%  ? ? ? ?Wt Readings from Last 3 Encounters:  ?02/07/22 206 lb 14.4 oz (93.8 kg)  ?01/24/22 201 lb 8 oz (91.4 kg)  ?01/10/22 203 lb 12 oz (92.4 kg)  ? ?Body mass index is 39.09 kg/m?Marland Kitchen   ?NAD ?GENERAL:alert, in no acute distress and comfortable ?SKIN: no acute rashes, no significant lesions ?EYES: conjunctiva are pink and non-injected, sclera anicteric ?OROPHARYNX: MMM, no exudates, no oropharyngeal erythema or ulceration ?NECK: supple,  no JVD ?LYMPH:  no palpable lymphadenopathy in the cervical, axillary or inguinal regions ?LUNGS: clear to auscultation b/l with normal respiratory effort ?HEART: regular rate & rhythm ?ABDOMEN:  normoactive bowel sounds , non tender, not distended. ?Extremity: no pedal edema ?PSYCH: alert & oriented x 3 with fluent speech ?NEURO: no focal motor/sensory deficits ? ? ? ?LABORATORY DATA:  ?I have reviewed the data as listed ? ?. ? ?  Latest Ref Rng & Units 02/07/2022  ?  8:51 AM 01/24/2022  ?  8:56 AM 01/10/2022  ?  8:49 AM  ?CBC  ?WBC 4.0 - 10.5 K/uL 4.1   3.6   3.7    ?Hemoglobin 12.0 - 15.0 g/dL 10.0   10.8   10.7    ?Hematocrit 36.0 - 46.0 % 31.0   32.3   32.2    ?Platelets 150 - 400 K/uL 202   191   199    ?[place] 1300 ? ?CBC ?   ?Component Value Date/Time  ? WBC 4.1 02/07/2022 0851  ? WBC 3.3 (L) 10/04/2021 0902  ? RBC 3.26 (L) 02/07/2022 0851  ? HGB 10.0 (L) 02/07/2022 0851  ? HGB 11.3 (L) 09/30/2017 1145  ? HCT 31.0 (L) 02/07/2022 0851  ? HCT 35.1 09/30/2017 1145  ? PLT 202 02/07/2022 0851  ? PLT 172 09/30/2017 1145  ? MCV 95.1 02/07/2022 0851  ? MCV 97.5 09/30/2017 1145  ? MCH 30.7 02/07/2022 0851  ? MCHC 32.3 02/07/2022 0851  ? RDW 14.5 02/07/2022 0851  ? RDW 14.0 09/30/2017 1145  ? LYMPHSABS 1.6 02/07/2022 0851  ? LYMPHSABS 1.3 09/30/2017 1145  ? MONOABS 0.5 02/07/2022 0851  ? MONOABS 0.2 09/30/2017 1145  ? EOSABS 0.0 02/07/2022 0851  ? EOSABS 0.0  09/30/2017 1145  ? EOSABS 0.1 06/03/2014 1003  ? BASOSABS 0.0 02/07/2022 0851  ? BASOSABS 0.0 09/30/2017 1145  ? ? ? ?. ? ?  Latest Ref Rng & Units 02/07/2022  ?  8:51 AM 01/24/2022  ?  8:56 AM 01/10/2022  ?  8:49 AM

## 2022-02-07 NOTE — Patient Instructions (Signed)
[facility]  Discharge Instructions: Thank you for choosing [facility] to provide your oncology and hematology care.   If you have a lab appointment with the [facility], please go directly to the [facility] and check in at the registration area.   Wear comfortable clothing and clothing appropriate for easy access to any Portacath or PICC line.   We strive to give you quality time with your provider. You may need to reschedule your appointment if you arrive late (15 or more minutes).  Arriving late affects you and other patients whose appointments are after yours.  Also, if you miss three or more appointments without notifying the office, you may be dismissed from the clinic at the provider's discretion.      For prescription refill requests, have your pharmacy contact our office and allow 72 hours for refills to be completed.    Today you received the following chemotherapy and/or immunotherapy agents carfilzomib   To help prevent nausea and vomiting after your treatment, we encourage you to take your nausea medication as directed.  BELOW ARE SYMPTOMS THAT SHOULD BE REPORTED IMMEDIATELY: . *FEVER GREATER THAN 100.4 F (38 C) OR HIGHER . *CHILLS OR SWEATING . *NAUSEA AND VOMITING THAT IS NOT CONTROLLED WITH YOUR NAUSEA MEDICATION . *UNUSUAL SHORTNESS OF BREATH . *UNUSUAL BRUISING OR BLEEDING . *URINARY PROBLEMS (pain or burning when urinating, or frequent urination) . *BOWEL PROBLEMS (unusual diarrhea, constipation, pain near the anus) . TENDERNESS IN MOUTH AND THROAT WITH OR WITHOUT PRESENCE OF ULCERS (sore throat, sores in mouth, or a toothache) . UNUSUAL RASH, SWELLING OR PAIN  . UNUSUAL VAGINAL DISCHARGE OR ITCHING   Items with * indicate a potential emergency and should be followed up as soon as possible or go to the Emergency Department if any problems should occur.  Please show the CHEMOTHERAPY ALERT CARD or IMMUNOTHERAPY ALERT  CARD at check-in to the Emergency Department and triage nurse.  Should you have questions after your visit or need to cancel or reschedule your appointment, please contact [facility]  Dept: [phone]  and follow the prompts.  Office hours are 8:00 a.m. to 4:30 p.m. Monday - Friday. Please note that voicemails left after 4:00 p.m. may not be returned until the following business day.  We are closed weekends and major holidays. You have access to a nurse at all times for urgent questions. Please call the main number to the clinic Dept: [phone] and follow the prompts.   For any non-urgent questions, you may also contact your provider using MyChart. We now offer e-Visits for anyone 18 and older to request care online for non-urgent symptoms. For details visit [email or web address].   Also download the MyChart app! Go to the app store, search "MyChart", open the app, select [place], and log in with your MyChart username and password.  Due to Covid, a mask is required upon entering the hospital/clinic. If you do not have a mask, one will be given to you upon arrival. For doctor visits, patients may have 1 support person aged 18 or older with them. For treatment visits, patients cannot have anyone with them due to current Covid guidelines and our immunocompromised population.   

## 2022-02-19 DIAGNOSIS — Z20822 Contact with and (suspected) exposure to covid-19: Secondary | ICD-10-CM | POA: Diagnosis not present

## 2022-02-20 ENCOUNTER — Other Ambulatory Visit: Payer: Self-pay

## 2022-02-20 DIAGNOSIS — C9001 Multiple myeloma in remission: Secondary | ICD-10-CM

## 2022-02-21 ENCOUNTER — Other Ambulatory Visit: Payer: Self-pay

## 2022-02-21 ENCOUNTER — Inpatient Hospital Stay: Payer: Medicare Other | Admitting: Dietician

## 2022-02-21 ENCOUNTER — Inpatient Hospital Stay: Payer: Medicare Other | Attending: Hematology

## 2022-02-21 ENCOUNTER — Inpatient Hospital Stay: Payer: Medicare Other

## 2022-02-21 VITALS — BP 142/78 | HR 72 | Temp 98.3°F | Resp 18 | Ht 64.0 in | Wt 203.5 lb

## 2022-02-21 DIAGNOSIS — C9000 Multiple myeloma not having achieved remission: Secondary | ICD-10-CM

## 2022-02-21 DIAGNOSIS — Z5112 Encounter for antineoplastic immunotherapy: Secondary | ICD-10-CM | POA: Diagnosis not present

## 2022-02-21 DIAGNOSIS — Z7189 Other specified counseling: Secondary | ICD-10-CM

## 2022-02-21 DIAGNOSIS — C9001 Multiple myeloma in remission: Secondary | ICD-10-CM

## 2022-02-21 DIAGNOSIS — Z95828 Presence of other vascular implants and grafts: Secondary | ICD-10-CM

## 2022-02-21 LAB — CBC WITH DIFFERENTIAL (CANCER CENTER ONLY)
Abs Immature Granulocytes: 0.01 10*3/uL (ref 0.00–0.07)
Basophils Absolute: 0 10*3/uL (ref 0.0–0.1)
Basophils Relative: 1 %
Eosinophils Absolute: 0.1 10*3/uL (ref 0.0–0.5)
Eosinophils Relative: 1 %
HCT: 32.1 % — ABNORMAL LOW (ref 36.0–46.0)
Hemoglobin: 10.3 g/dL — ABNORMAL LOW (ref 12.0–15.0)
Immature Granulocytes: 0 %
Lymphocytes Relative: 48 %
Lymphs Abs: 1.9 10*3/uL (ref 0.7–4.0)
MCH: 30.6 pg (ref 26.0–34.0)
MCHC: 32.1 g/dL (ref 30.0–36.0)
MCV: 95.3 fL (ref 80.0–100.0)
Monocytes Absolute: 0.4 10*3/uL (ref 0.1–1.0)
Monocytes Relative: 10 %
Neutro Abs: 1.6 10*3/uL — ABNORMAL LOW (ref 1.7–7.7)
Neutrophils Relative %: 40 %
Platelet Count: 240 10*3/uL (ref 150–400)
RBC: 3.37 MIL/uL — ABNORMAL LOW (ref 3.87–5.11)
RDW: 14.2 % (ref 11.5–15.5)
WBC Count: 4 10*3/uL (ref 4.0–10.5)
nRBC: 0 % (ref 0.0–0.2)

## 2022-02-21 LAB — BASIC METABOLIC PANEL
Anion gap: 6 (ref 5–15)
BUN: 13 mg/dL (ref 8–23)
CO2: 24 mmol/L (ref 22–32)
Calcium: 8.6 mg/dL — ABNORMAL LOW (ref 8.9–10.3)
Chloride: 110 mmol/L (ref 98–111)
Creatinine, Ser: 0.78 mg/dL (ref 0.44–1.00)
GFR, Estimated: >60 mL/min (ref >60)
Glucose, Bld: 86 mg/dL (ref 70–99)
Potassium: 3.8 mmol/L (ref 3.5–5.1)
Sodium: 140 mmol/L (ref 135–145)

## 2022-02-21 MED ORDER — PROCHLORPERAZINE MALEATE 10 MG PO TABS
10.0000 mg | ORAL_TABLET | Freq: Once | ORAL | Status: AC
  Administered 2022-02-21: 10 mg via ORAL
  Filled 2022-02-21: qty 1

## 2022-02-21 MED ORDER — SODIUM CHLORIDE 0.9 % IV SOLN
4.0000 mg | Freq: Once | INTRAVENOUS | Status: AC
  Administered 2022-02-21: 4 mg via INTRAVENOUS
  Filled 2022-02-21: qty 0.4

## 2022-02-21 MED ORDER — DEXTROSE 5 % IV SOLN
56.0000 mg/m2 | Freq: Once | INTRAVENOUS | Status: AC
  Administered 2022-02-21: 120 mg via INTRAVENOUS
  Filled 2022-02-21: qty 60

## 2022-02-21 MED ORDER — SODIUM CHLORIDE 0.9% FLUSH
10.0000 mL | INTRAVENOUS | Status: DC | PRN
  Administered 2022-02-21: 10 mL

## 2022-02-21 MED ORDER — ACETAMINOPHEN 500 MG PO TABS
1000.0000 mg | ORAL_TABLET | Freq: Once | ORAL | Status: AC
  Administered 2022-02-21: 1000 mg via ORAL
  Filled 2022-02-21: qty 2

## 2022-02-21 MED ORDER — SODIUM CHLORIDE 0.9 % IV SOLN: Freq: Once | INTRAVENOUS | Status: AC

## 2022-02-21 MED ORDER — SODIUM CHLORIDE 0.9% FLUSH
10.0000 mL | Freq: Once | INTRAVENOUS | Status: AC
  Administered 2022-02-21: 10 mL

## 2022-02-21 MED ORDER — HEPARIN SOD (PORK) LOCK FLUSH 100 UNIT/ML IV SOLN
500.0000 [IU] | Freq: Once | INTRAVENOUS | Status: AC | PRN
  Administered 2022-02-21: 500 [IU]

## 2022-02-21 NOTE — Patient Instructions (Signed)
[facility]  Discharge Instructions: Thank you for choosing [facility] to provide your oncology and hematology care.   If you have a lab appointment with the Cancer Center, please go directly to the Cancer Center and check in at the registration area.   Wear comfortable clothing and clothing appropriate for easy access to any Portacath or PICC line.   We strive to give you quality time with your provider. You may need to reschedule your appointment if you arrive late (15 or more minutes).  Arriving late affects you and other patients whose appointments are after yours.  Also, if you miss three or more appointments without notifying the office, you may be dismissed from the clinic at the provider's discretion.      For prescription refill requests, have your pharmacy contact our office and allow 72 hours for refills to be completed.    Today you received the following chemotherapy and/or immunotherapy agents carfilzomib   To help prevent nausea and vomiting after your treatment, we encourage you to take your nausea medication as directed.  BELOW ARE SYMPTOMS THAT SHOULD BE REPORTED IMMEDIATELY: . *FEVER GREATER THAN 100.4 F (38 C) OR HIGHER . *CHILLS OR SWEATING . *NAUSEA AND VOMITING THAT IS NOT CONTROLLED WITH YOUR NAUSEA MEDICATION . *UNUSUAL SHORTNESS OF BREATH . *UNUSUAL BRUISING OR BLEEDING . *URINARY PROBLEMS (pain or burning when urinating, or frequent urination) . *BOWEL PROBLEMS (unusual diarrhea, constipation, pain near the anus) . TENDERNESS IN MOUTH AND THROAT WITH OR WITHOUT PRESENCE OF ULCERS (sore throat, sores in mouth, or a toothache) . UNUSUAL RASH, SWELLING OR PAIN  . UNUSUAL VAGINAL DISCHARGE OR ITCHING   Items with * indicate a potential emergency and should be followed up as soon as possible or go to the Emergency Department if any problems should occur.  Please show the CHEMOTHERAPY ALERT CARD or IMMUNOTHERAPY ALERT  CARD at check-in to the Emergency Department and triage nurse.  Should you have questions after your visit or need to cancel or reschedule your appointment, please contact [facility]  Dept: [phone]  and follow the prompts.  Office hours are 8:00 a.m. to 4:30 p.m. Monday - Friday. Please note that voicemails left after 4:00 p.m. may not be returned until the following business day.  We are closed weekends and major holidays. You have access to a nurse at all times for urgent questions. Please call the main number to the clinic Dept: [phone] and follow the prompts.   For any non-urgent questions, you may also contact your provider using MyChart. We now offer e-Visits for anyone 18 and older to request care online for non-urgent symptoms. For details visit [email or web address].   Also download the MyChart app! Go to the app store, search "MyChart", open the app, select [place], and log in with your MyChart username and password.  Due to Covid, a mask is required upon entering the hospital/clinic. If you do not have a mask, one will be given to you upon arrival. For doctor visits, patients may have 1 support person aged 18 or older with them. For treatment visits, patients cannot have anyone with them due to current Covid guidelines and our immunocompromised population.   

## 2022-02-21 NOTE — Progress Notes (Signed)
Ok to treat w BMET today. ? ?Raul Del [place], RPH, BCPS, BCOP ?02/21/2022 ?9:52 AM ? ?

## 2022-02-21 NOTE — Progress Notes (Signed)
Nutrition Follow-up: ? ?Patient with smoldering multiple myeloma. She is receiving kyprolis q28d and zometa q12wk ? ?Met with patient during infusion. She reports appetite and weights continue to fluctuate around treatment. Patient picked up high protein Ensure over the weekend. She has not tried this yet. Patient will use supplement on days when appetite is decreased. She has been more active. Patient gardening, doing house chores, and walking the stairs at home. She denies nausea, vomiting, diarrhea, constipation.  ? ? ?Medications: reviewed  ? ?Labs: reviewed ? ?Anthropometrics: Weight 203 lb 8 oz decreased  ? ?4/26 - 206 lb 14.4 oz  ?4/12 - 201 lb 8 oz  ?3/29 - 203 lb 12 oz  ?3/15 - 203 lb 9.6 oz  ?2/15 - 199 lb 4 oz  ? ? ?NUTRITION DIAGNOSIS: Food and nutrition related knowledge deficit improving  ? ?INTERVENTION:  ?Continue eating small frequent meals and snacks ?Reviewed sources of lean proteins and discussed difference between simple and complex carbohydrates ?Continue drinking Ensure High Protein as needed - coupons provided ?Recommend daily bariatric vitamin  ?Patient has contact information  ?  ? ?MONITORING, EVALUATION, GOAL: weight trends, intake  ? ? ?NEXT VISIT: No follow up scheduled at this time. Patient has contact information. Encouraged to contact with nutrition questions/concerns ? ? ? ?

## 2022-02-26 LAB — MULTIPLE MYELOMA PANEL, SERUM
Albumin SerPl Elph-Mcnc: 3.9 g/dL (ref 2.9–4.4)
Albumin/Glob SerPl: 1.8 — ABNORMAL HIGH (ref 0.7–1.7)
Alpha 1: 0.2 g/dL (ref 0.0–0.4)
Alpha2 Glob SerPl Elph-Mcnc: 0.5 g/dL (ref 0.4–1.0)
B-Globulin SerPl Elph-Mcnc: 1 g/dL (ref 0.7–1.3)
Gamma Glob SerPl Elph-Mcnc: 0.5 g/dL (ref 0.4–1.8)
Globulin, Total: 2.2 g/dL (ref 2.2–3.9)
IgA: 38 mg/dL — ABNORMAL LOW (ref 87–352)
IgG (Immunoglobin G), Serum: 683 mg/dL (ref 586–1602)
IgM (Immunoglobulin M), Srm: 16 mg/dL — ABNORMAL LOW (ref 26–217)
Total Protein ELP: 6.1 g/dL (ref 6.0–8.5)

## 2022-03-05 ENCOUNTER — Other Ambulatory Visit: Payer: Self-pay

## 2022-03-05 DIAGNOSIS — C9000 Multiple myeloma not having achieved remission: Secondary | ICD-10-CM

## 2022-03-07 ENCOUNTER — Inpatient Hospital Stay: Payer: Medicare Other

## 2022-03-07 ENCOUNTER — Other Ambulatory Visit: Payer: Self-pay

## 2022-03-07 VITALS — BP 128/75 | HR 87 | Temp 98.0°F | Resp 18 | Wt 198.5 lb

## 2022-03-07 DIAGNOSIS — C9000 Multiple myeloma not having achieved remission: Secondary | ICD-10-CM

## 2022-03-07 DIAGNOSIS — Z5112 Encounter for antineoplastic immunotherapy: Secondary | ICD-10-CM | POA: Diagnosis not present

## 2022-03-07 DIAGNOSIS — Z7189 Other specified counseling: Secondary | ICD-10-CM

## 2022-03-07 DIAGNOSIS — Z95828 Presence of other vascular implants and grafts: Secondary | ICD-10-CM

## 2022-03-07 LAB — CBC WITH DIFFERENTIAL (CANCER CENTER ONLY)
Abs Immature Granulocytes: 0.02 10*3/uL (ref 0.00–0.07)
Basophils Absolute: 0 10*3/uL (ref 0.0–0.1)
Basophils Relative: 1 %
Eosinophils Absolute: 0.1 10*3/uL (ref 0.0–0.5)
Eosinophils Relative: 2 %
HCT: 31.4 % — ABNORMAL LOW (ref 36.0–46.0)
Hemoglobin: 10.5 g/dL — ABNORMAL LOW (ref 12.0–15.0)
Immature Granulocytes: 1 %
Lymphocytes Relative: 44 %
Lymphs Abs: 1.9 10*3/uL (ref 0.7–4.0)
MCH: 31 pg (ref 26.0–34.0)
MCHC: 33.4 g/dL (ref 30.0–36.0)
MCV: 92.6 fL (ref 80.0–100.0)
Monocytes Absolute: 0.3 10*3/uL (ref 0.1–1.0)
Monocytes Relative: 8 %
Neutro Abs: 1.9 10*3/uL (ref 1.7–7.7)
Neutrophils Relative %: 44 %
Platelet Count: 213 10*3/uL (ref 150–400)
RBC: 3.39 MIL/uL — ABNORMAL LOW (ref 3.87–5.11)
RDW: 14.3 % (ref 11.5–15.5)
WBC Count: 4.2 10*3/uL (ref 4.0–10.5)
nRBC: 0 % (ref 0.0–0.2)

## 2022-03-07 LAB — BASIC METABOLIC PANEL - CANCER CENTER ONLY
Anion gap: 7 (ref 5–15)
BUN: 9 mg/dL (ref 8–23)
CO2: 24 mmol/L (ref 22–32)
Calcium: 8.2 mg/dL — ABNORMAL LOW (ref 8.9–10.3)
Chloride: 110 mmol/L (ref 98–111)
Creatinine: 0.78 mg/dL (ref 0.44–1.00)
GFR, Estimated: >60 mL/min (ref >60)
Glucose, Bld: 126 mg/dL — ABNORMAL HIGH (ref 70–99)
Potassium: 3.4 mmol/L — ABNORMAL LOW (ref 3.5–5.1)
Sodium: 141 mmol/L (ref 135–145)

## 2022-03-07 MED ORDER — PROCHLORPERAZINE MALEATE 10 MG PO TABS
10.0000 mg | ORAL_TABLET | Freq: Once | ORAL | Status: AC
  Administered 2022-03-07: 10 mg via ORAL
  Filled 2022-03-07: qty 1

## 2022-03-07 MED ORDER — SODIUM CHLORIDE 0.9 % IV SOLN: Freq: Once | INTRAVENOUS | Status: AC

## 2022-03-07 MED ORDER — DEXTROSE 5 % IV SOLN
56.0000 mg/m2 | Freq: Once | INTRAVENOUS | Status: AC
  Administered 2022-03-07: 120 mg via INTRAVENOUS
  Filled 2022-03-07: qty 60

## 2022-03-07 MED ORDER — SODIUM CHLORIDE 0.9% FLUSH
10.0000 mL | INTRAVENOUS | Status: DC | PRN
  Administered 2022-03-07: 10 mL

## 2022-03-07 MED ORDER — SODIUM CHLORIDE 0.9 % IV SOLN
4.0000 mg | Freq: Once | INTRAVENOUS | Status: AC
  Administered 2022-03-07: 4 mg via INTRAVENOUS
  Filled 2022-03-07: qty 0.4

## 2022-03-07 MED ORDER — ACETAMINOPHEN 500 MG PO TABS
1000.0000 mg | ORAL_TABLET | Freq: Once | ORAL | Status: AC
  Administered 2022-03-07: 1000 mg via ORAL
  Filled 2022-03-07: qty 2

## 2022-03-07 MED ORDER — SODIUM CHLORIDE 0.9% FLUSH
10.0000 mL | Freq: Once | INTRAVENOUS | Status: AC
  Administered 2022-03-07: 10 mL

## 2022-03-07 MED ORDER — HEPARIN SOD (PORK) LOCK FLUSH 100 UNIT/ML IV SOLN
500.0000 [IU] | Freq: Once | INTRAVENOUS | Status: AC | PRN
  Administered 2022-03-07: 500 [IU]

## 2022-03-07 NOTE — Patient Instructions (Signed)
[facility]  Discharge Instructions: Thank you for choosing [facility] to provide your oncology and hematology care.   If you have a lab appointment with the [facility], please go directly to the [facility] and check in at the registration area.   Wear comfortable clothing and clothing appropriate for easy access to any Portacath or PICC line.   We strive to give you quality time with your provider. You may need to reschedule your appointment if you arrive late (15 or more minutes).  Arriving late affects you and other patients whose appointments are after yours.  Also, if you miss three or more appointments without notifying the office, you may be dismissed from the clinic at the provider's discretion.      For prescription refill requests, have your pharmacy contact our office and allow 72 hours for refills to be completed.    Today you received the following chemotherapy and/or immunotherapy agent: Kyprolis      To help prevent nausea and vomiting after your treatment, we encourage you to take your nausea medication as directed.  BELOW ARE SYMPTOMS THAT SHOULD BE REPORTED IMMEDIATELY: *FEVER GREATER THAN 100.4 F (38 C) OR HIGHER *CHILLS OR SWEATING *NAUSEA AND VOMITING THAT IS NOT CONTROLLED WITH YOUR NAUSEA MEDICATION *UNUSUAL SHORTNESS OF BREATH *UNUSUAL BRUISING OR BLEEDING *URINARY PROBLEMS (pain or burning when urinating, or frequent urination) *BOWEL PROBLEMS (unusual diarrhea, constipation, pain near the anus) TENDERNESS IN MOUTH AND THROAT WITH OR WITHOUT PRESENCE OF ULCERS (sore throat, sores in mouth, or a toothache) UNUSUAL RASH, SWELLING OR PAIN  UNUSUAL VAGINAL DISCHARGE OR ITCHING   Items with * indicate a potential emergency and should be followed up as soon as possible or go to the Emergency Department if any problems should occur.  Please show the CHEMOTHERAPY ALERT CARD or IMMUNOTHERAPY ALERT CARD at check-in to  the Emergency Department and triage nurse.  Should you have questions after your visit or need to cancel or reschedule your appointment, please contact [facility]  Dept: [phone]  and follow the prompts.  Office hours are 8:00 a.m. to 4:30 p.m. Monday - Friday. Please note that voicemails left after 4:00 p.m. may not be returned until the following business day.  We are closed weekends and major holidays. You have access to a nurse at all times for urgent questions. Please call the main number to the clinic Dept: [phone] and follow the prompts.   For any non-urgent questions, you may also contact your provider using MyChart. We now offer e-Visits for anyone 67 and older to request care online for non-urgent symptoms. For details visit [email or web address].   Also download the MyChart app! Go to the app store, search "MyChart", open the app, select [facility], and log in with your MyChart username and password.  Due to Covid, a mask is required upon entering the hospital/clinic. If you do not have a mask, one will be given to you upon arrival. For doctor visits, patients may have 1 support person aged 103 or older with them. For treatment visits, patients cannot have anyone with them due to current Covid guidelines and our immunocompromised population.

## 2022-03-20 ENCOUNTER — Other Ambulatory Visit: Payer: Self-pay

## 2022-03-20 DIAGNOSIS — C9000 Multiple myeloma not having achieved remission: Secondary | ICD-10-CM

## 2022-03-21 ENCOUNTER — Inpatient Hospital Stay: Payer: Medicare Other

## 2022-03-21 ENCOUNTER — Inpatient Hospital Stay: Payer: Medicare Other | Attending: Hematology

## 2022-03-21 ENCOUNTER — Other Ambulatory Visit: Payer: Self-pay

## 2022-03-21 ENCOUNTER — Inpatient Hospital Stay (HOSPITAL_BASED_OUTPATIENT_CLINIC_OR_DEPARTMENT_OTHER): Payer: Medicare Other | Admitting: Hematology

## 2022-03-21 VITALS — BP 149/69 | HR 77 | Temp 97.7°F | Resp 18 | Ht 64.0 in | Wt 206.6 lb

## 2022-03-21 DIAGNOSIS — C9000 Multiple myeloma not having achieved remission: Secondary | ICD-10-CM

## 2022-03-21 DIAGNOSIS — M8580 Other specified disorders of bone density and structure, unspecified site: Secondary | ICD-10-CM | POA: Diagnosis not present

## 2022-03-21 DIAGNOSIS — C9001 Multiple myeloma in remission: Secondary | ICD-10-CM

## 2022-03-21 DIAGNOSIS — Z86718 Personal history of other venous thrombosis and embolism: Secondary | ICD-10-CM | POA: Insufficient documentation

## 2022-03-21 DIAGNOSIS — Z5112 Encounter for antineoplastic immunotherapy: Secondary | ICD-10-CM | POA: Diagnosis not present

## 2022-03-21 DIAGNOSIS — Z7189 Other specified counseling: Secondary | ICD-10-CM

## 2022-03-21 DIAGNOSIS — Z95828 Presence of other vascular implants and grafts: Secondary | ICD-10-CM

## 2022-03-21 LAB — CBC WITH DIFFERENTIAL (CANCER CENTER ONLY)
Abs Immature Granulocytes: 0.01 10*3/uL (ref 0.00–0.07)
Basophils Absolute: 0 10*3/uL (ref 0.0–0.1)
Basophils Relative: 1 %
Eosinophils Absolute: 0.1 10*3/uL (ref 0.0–0.5)
Eosinophils Relative: 1 %
HCT: 30 % — ABNORMAL LOW (ref 36.0–46.0)
Hemoglobin: 9.7 g/dL — ABNORMAL LOW (ref 12.0–15.0)
Immature Granulocytes: 0 %
Lymphocytes Relative: 44 %
Lymphs Abs: 1.6 10*3/uL (ref 0.7–4.0)
MCH: 30.8 pg (ref 26.0–34.0)
MCHC: 32.3 g/dL (ref 30.0–36.0)
MCV: 95.2 fL (ref 80.0–100.0)
Monocytes Absolute: 0.3 10*3/uL (ref 0.1–1.0)
Monocytes Relative: 8 %
Neutro Abs: 1.7 10*3/uL (ref 1.7–7.7)
Neutrophils Relative %: 46 %
Platelet Count: 216 10*3/uL (ref 150–400)
RBC: 3.15 MIL/uL — ABNORMAL LOW (ref 3.87–5.11)
RDW: 14.8 % (ref 11.5–15.5)
WBC Count: 3.7 10*3/uL — ABNORMAL LOW (ref 4.0–10.5)
nRBC: 0 % (ref 0.0–0.2)

## 2022-03-21 LAB — CMP (CANCER CENTER ONLY)
ALT: 25 U/L (ref 0–44)
AST: 23 U/L (ref 15–41)
Albumin: 3.8 g/dL (ref 3.5–5.0)
Alkaline Phosphatase: 63 U/L (ref 38–126)
Anion gap: 4 — ABNORMAL LOW (ref 5–15)
BUN: 10 mg/dL (ref 8–23)
CO2: 24 mmol/L (ref 22–32)
Calcium: 8.6 mg/dL — ABNORMAL LOW (ref 8.9–10.3)
Chloride: 112 mmol/L — ABNORMAL HIGH (ref 98–111)
Creatinine: 0.76 mg/dL (ref 0.44–1.00)
GFR, Estimated: >60 mL/min (ref >60)
Glucose, Bld: 121 mg/dL — ABNORMAL HIGH (ref 70–99)
Potassium: 3.6 mmol/L (ref 3.5–5.1)
Sodium: 140 mmol/L (ref 135–145)
Total Bilirubin: 0.8 mg/dL (ref 0.3–1.2)
Total Protein: 6.1 g/dL — ABNORMAL LOW (ref 6.5–8.1)

## 2022-03-21 MED ORDER — SODIUM CHLORIDE 0.9% FLUSH
10.0000 mL | Freq: Once | INTRAVENOUS | Status: AC
  Administered 2022-03-21: 10 mL

## 2022-03-21 MED ORDER — DEXTROSE 5 % IV SOLN
56.0000 mg/m2 | Freq: Once | INTRAVENOUS | Status: AC
  Administered 2022-03-21: 120 mg via INTRAVENOUS
  Filled 2022-03-21: qty 60

## 2022-03-21 MED ORDER — PROCHLORPERAZINE MALEATE 10 MG PO TABS
10.0000 mg | ORAL_TABLET | Freq: Once | ORAL | Status: AC
  Administered 2022-03-21: 10 mg via ORAL
  Filled 2022-03-21: qty 1

## 2022-03-21 MED ORDER — HEPARIN SOD (PORK) LOCK FLUSH 100 UNIT/ML IV SOLN
500.0000 [IU] | Freq: Once | INTRAVENOUS | Status: AC | PRN
  Administered 2022-03-21: 500 [IU]

## 2022-03-21 MED ORDER — SODIUM CHLORIDE 0.9 % IV SOLN: Freq: Once | INTRAVENOUS | Status: AC

## 2022-03-21 MED ORDER — SODIUM CHLORIDE 0.9 % IV SOLN
4.0000 mg | Freq: Once | INTRAVENOUS | Status: AC
  Administered 2022-03-21: 4 mg via INTRAVENOUS
  Filled 2022-03-21: qty 0.4

## 2022-03-21 MED ORDER — SODIUM CHLORIDE 0.9% FLUSH
10.0000 mL | INTRAVENOUS | Status: DC | PRN
  Administered 2022-03-21: 10 mL

## 2022-03-21 MED ORDER — ACETAMINOPHEN 500 MG PO TABS
1000.0000 mg | ORAL_TABLET | Freq: Once | ORAL | Status: AC
  Administered 2022-03-21: 1000 mg via ORAL
  Filled 2022-03-21: qty 2

## 2022-03-21 MED ORDER — SODIUM CHLORIDE 0.9 % IV SOLN: Freq: Once | INTRAVENOUS | Status: DC

## 2022-03-21 NOTE — Progress Notes (Signed)
HEMATOLOGY/ONCOLOGY CLINIC NOTE  Date of Service: 03/21/2022  PCP: Marzetta Board MD  CHIEF COMPLAINTS/PURPOSE OF CONSULTATION:  Follow-up for continued management of multiple myeloma  HISTORY OF PRESENTING ILLNESS:  See previous note for details on initial presentation  INTERVAL HISTORY:  Madison Parker is a 71 y.o. female here for continued evaluation and management of multiple myeloma. She reports She is doing well with no new symptoms or concerns.  She notes that her previous itching in groin area is well controlled at this time.  She reports that her joint pains remain persistent but are not overly bothersome.  She is tolerating the Carfilzomib well with no prohibitive toxicities at this time.  We discussed starting a vitamin B complex in addition to her B12 which she was agreeable to.  No new or abnormal bleeding issues. No new infection issues. No new focal bone pains. No other new or acute focal symptoms.  Labs done today were reviewed in detail. CBC shows hemoglobin of 9.7 WBC count and normal platelets CMP stable with normal creatinine of 0.76 reduced calcium level of 8.6. myeloma panel showed pending K/L light chains shows wnl  MEDICAL HISTORY:  Past Medical History:  Diagnosis Date   Headache    Low back pain    SBO (small bowel obstruction) ([place]) 2010   Smoldering multiple myeloma   Previous history of hypothyroidism- not currently medications Anxiety Obesity .There is no height or weight on file to calculate BMI. 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 Lumbosacral radiculopathy Left hip pain  SURGICAL HISTORY: Past Surgical History:  Procedure Laterality Date   ABDOMINAL HYSTERECTOMY     complete   BACK SURGERY     lower at [facility]   COLONOSCOPY WITH PROPOFOL N/A 08/23/2020   Procedure: COLONOSCOPY WITH PROPOFOL;  Surgeon: Harvel Quale, MD;  Location: [facility];   Service: Gastroenterology;  Laterality: N/A;  900   colonscopy  2014   GASTRIC BYPASS  yrs ago   HERNIA REPAIR     IR IMAGING GUIDED PORT INSERTION  04/20/2019   KNEE SURGERY  06/04/2009   both knees replaced    POLYPECTOMY  08/23/2020   Procedure: POLYPECTOMY;  Surgeon: Harvel Quale, MD;  Location: [facility];  Service: Gastroenterology;;   TONSILLECTOMY  age 89   TOTAL HIP ARTHROPLASTY Left 08/16/2017   Procedure: LEFT TOTAL HIP ARTHROPLASTY ANTERIOR APPROACH;  Surgeon: Dorna Leitz, MD;  Location: [facility];  Service: Orthopedics;  Laterality: Left;  Spinal surgery April 2017  SOCIAL HISTORY: Social History   Socioeconomic History   Marital status: Married    Spouse name: Not on file   Number of children: Not on file   Years of education: Not on file   Highest education level: Not on file  Occupational History   Not on file  Tobacco Use   Smoking status: Former    Packs/day: 0.50    Years: 10.00    Pack years: 5.00    Types: Cigarettes   Smokeless tobacco: Never   Tobacco comments:    quit 1977  Vaping Use   Vaping Use: Never used  Substance and Sexual Activity   Alcohol use: Yes    Comment: occ   Drug use: No   Sexual activity: Not on file  Other Topics Concern   Not on file  Social History Narrative   Not on file   Social Determinants of Health   Financial  Resource Strain: Not on file  Food Insecurity: Not on file  Transportation Needs: Not on file  Physical Activity: Not on file  Stress: Not on file  Social Connections: Not on file  Intimate Partner Violence: Not At Risk   Fear of Current or Ex-Partner: No   Emotionally Abused: No   Physically Abused: No   Sexually Abused: No  Occasional alcohol use Former smoker and smoked 1 pack per week for about 9 years has since quit.  FAMILY HISTORY: Family History  Problem Relation Age of Onset   Breast cancer Neg Hx     ALLERGIES:  is allergic to codeine.  MEDICATIONS:  Current Outpatient  Medications  Medication Sig Dispense Refill   acyclovir (ZOVIRAX) 800 MG tablet Take 0.5 tablets (400 mg total) by mouth 2 (two) times daily. 60 tablet 11   Calcium Carbonate-Vitamin D 600-200 MG-UNIT TABS Calcium 600 with Vitamin D3 600 mg-5 mcg (200 unit) tablet  Take 1 tablet every day by oral route as directed.     Cholecalciferol 1.25 MG (50000 UT) TABS Take by mouth.     fluconazole (DIFLUCAN) 100 MG tablet Take 1 tablet (100 mg total) by mouth daily. 10 tablet 0   FOLIC ACID PO Take 7,829 mg by mouth daily.      lidocaine-prilocaine (EMLA) cream Apply 1 application. topically as needed. 30 g 2   NYSTATIN powder Apply topically 2 (two) times daily. (Patient not taking: Reported on 12/27/2021) 15 g 1   Probiotic Product (PROBIOTIC DAILY PO) Take 2 capsules by mouth daily. Gummies     vitamin B-12 (CYANOCOBALAMIN) 1000 MCG tablet Take 1,000 mcg by mouth daily.      Vitamin D, Ergocalciferol, (DRISDOL) 1.25 MG (50000 UNIT) CAPS capsule TAKE 1 CAPSULE ONCE A WEEK (Patient taking differently: Take 50,000 Units by mouth every 7 (seven) days. Sunday) 13 capsule 3   No current facility-administered medications for this visit.   Facility-Administered Medications Ordered in Other Visits  Medication Dose Route Frequency Provider Last Rate Last Admin   sodium chloride flush (NS) 0.9 % injection 10 mL  10 mL Intracatheter PRN Brunetta Genera, MD   10 mL at 05/11/19 1437    REVIEW OF SYSTEMS:  10 Point review of Systems was done is negative except as noted above.  PHYSICAL EXAMINATION: ECOG FS:1 - Symptomatic but completely ambulatory  Vitals:   03/21/22 1057  BP: (!) 149/69  Pulse: 77  Resp: 18  Temp: 97.7 F (36.5 C)  SpO2: 96%   Wt Readings from Last 3 Encounters:  03/07/22 198 lb 8 oz (90 kg)  02/21/22 203 lb 8 oz (92.3 kg)  02/07/22 206 lb 14.4 oz (93.8 kg)   There is no height or weight on file to calculate BMI.   NAD GENERAL:alert, in no acute distress and  comfortable SKIN: no acute rashes, no significant lesions EYES: conjunctiva are pink and non-injected, sclera anicteric NECK: supple, no JVD LYMPH:  no palpable lymphadenopathy in the cervical, axillary or inguinal regions LUNGS: clear to auscultation b/l with normal respiratory effort HEART: regular rate & rhythm ABDOMEN:  normoactive bowel sounds , non tender, not distended. Extremity: no pedal edema PSYCH: alert & oriented x 3 with fluent speech NEURO: no focal motor/sensory deficits  LABORATORY DATA:  I have reviewed the data as listed  .    Latest Ref Rng & Units 03/21/2022   10:39 AM 03/07/2022    8:38 AM 02/21/2022    8:51 AM  CBC  WBC 4.0 - 10.5 K/uL 3.7  4.2  4.0   Hemoglobin 12.0 - 15.0 g/dL 9.7  10.5  10.3   Hematocrit 36.0 - 46.0 % 30.0  31.4  32.1   Platelets 150 - 400 K/uL 216  213  240    CBC    Component Value Date/Time   WBC 3.7 (L) 03/21/2022 1039   WBC 3.3 (L) 10/04/2021 0902   RBC 3.15 (L) 03/21/2022 1039   HGB 9.7 (L) 03/21/2022 1039   HGB 11.3 (L) 09/30/2017 1145   HCT 30.0 (L) 03/21/2022 1039   HCT 35.1 09/30/2017 1145   PLT 216 03/21/2022 1039   PLT 172 09/30/2017 1145   MCV 95.2 03/21/2022 1039   MCV 97.5 09/30/2017 1145   MCH 30.8 03/21/2022 1039   MCHC 32.3 03/21/2022 1039   RDW 14.8 03/21/2022 1039   RDW 14.0 09/30/2017 1145   LYMPHSABS 1.6 03/21/2022 1039   LYMPHSABS 1.3 09/30/2017 1145   MONOABS 0.3 03/21/2022 1039   MONOABS 0.2 09/30/2017 1145   EOSABS 0.1 03/21/2022 1039   EOSABS 0.0 09/30/2017 1145   EOSABS 0.1 06/03/2014 1003   BASOSABS 0.0 03/21/2022 1039   BASOSABS 0.0 09/30/2017 1145     .    Latest Ref Rng & Units 03/21/2022   10:39 AM 03/07/2022    8:38 AM 02/21/2022    8:51 AM  CMP  Glucose 70 - 99 mg/dL 121  126  86   BUN 8 - 23 mg/dL _0 Creatinine 0.44 - 1.00 mg/dL 0.76  0.78  0.78   Sodium 135 - 145 mmol/L 140  141  140   Potassium 3.5 - 5.1 mmol/L 3.6  3.4  3.8   Chloride 98 - 111 mmol/L 112  110  110    CO2 22 - 32 mmol/L _1 Calcium 8.9 - 10.3 mg/dL 8.6  8.2  8.6   Total Protein 6.5 - 8.1 g/dL 6.1     Total Bilirubin 0.3 - 1.2 mg/dL 0.8     Alkaline Phos 38 - 126 U/L 63     AST 15 - 41 U/L 23     ALT 0 - 44 U/L 25      03/11/19 BM Bx:   03/11/19 Cytogenetics:            RADIOGRAPHIC STUDIES: I have personally reviewed the radiological images as listed and agreed with the findings in the report. No results found.   Bone survey 11/08/2016 IMPRESSION: 1. Small lytic lesion in the right scapula. 2. Possible small lytic lesion in the left scapula. 3. Postoperative changes. 4. Significant degenerative changes in both hips, left greater than right. 5. No evidence for acute fracture     Electronically Signed   By: Nolon Nations M.D.   On: 11/08/2016 16:19   ASSESSMENT & PLAN:   71 y.o. very pleasant lady with history of   1) R-ISS 3 light chain multiple myeloma (concern for small lytic lesions in left and right scapulae) - (appears light chain producing) and Progressive anemia This was apparently diagnosed as smoldering myeloma in 2011 by her hematologist in [place]. No renal failure/hypercalcemia. Bone survey with concern for possible small lytic lesions in B/L scapulae. PET/CT scan on 03/12/2017 showed no concerning bone lesions. Initial Bm Bx with 17% kappa restricted plasma cells consistent with plasma cell neoplasm. No treatment so far. 03/11/19 BM Bx revealed involvement by 50% plasma cells; genetics -  high risk t(14;16), previous genetics from April 2018 BM Bx were standard risk 03/16/19 MRI Lumbar reveals several explanations for her lower back pain including L2-L3 stenosis, but reassuringly, there was not evidence of involvement by multiple myeloma 04/01/19 PET/CT which revealed no FDG evidence of active multiple myeloma within the skeleton. No evidence of lytic lesions within the skeleton or soft tissue Plasmacytoma. 05/07/2019 DXA scan  revealed osteopenia, T-score of -1.2.  2) Chronic leukopenia - ? Related to SMM vs other nutritional deficiencies (h/o gastric bypass surgery put her at risk for nutritional deficiencies).  WBC counts of have been close to normal recently today at 3.8k with an [place] of 1600. B12 levels WNL Ferritin adequate ?additional factor -recent NSAID use. ?element of benign ethnic neutropenia. Has not had any issues with frequent infection.   3) Neuropathy/radiculopathy -Continue follow up with orthopedics  4) history of superficial Venous Thrombosis of the left small saphenous vein  06/24/2019 US venous left : Right: There is no evidence of deep vein thrombosis in the lower extremity. No cystic structure found in the popliteal fossa. Left: Findings consistent with acute superficial vein thrombosis involving the left small saphenous vein. There is no evidence of deep vein thrombosis in the lower extremity.   PLAN: -Labs done today were reviewed in detail. CBC shows hemoglobin of 9.7 WBC count and normal platelets CMP stable with normal creatinine of 0.76 reduced calcium level of 8.6. myeloma panel -pending K/L light chains shows wnl -We will continue to monitor myeloma labs every 2 months on maintenance treatment. -Patient has no overt new toxicities from her carfilzomib maintenance treatment at this time. -We shall continue maintenance carfilzomib at 56 mg per metered squared every 2 weeks. -Patient has no clinical or lab evidence of multiple myeloma recurrence/progression at this time. -Her dexamethasone pretreatment with carfilzomib has been minimized and is down to just 4 mg every 2 weeks. -No new dental issues we shall continue Zometa every 12 weeks for maintenance. -Start taking a vitamin B complex. -RTC in 8 weeks  FOLLOW UP: Please schedule next 4 cycles [8 doses] of Carfilzomib with port flush and labs -Continue Zometa every 12 weeks please schedule next 3 doses -MD visit in 8  weeks   The total time spent in the appointment was 20 minutes*.   All of the patient's questions were answered with apparent satisfaction. The patient knows to call the clinic with any problems, questions or concerns.  Sullivan Lone MD MS AAHIVMS [facility] Hematology/Oncology Physician [facility]  .*Total Encounter Time as defined by the Centers for Medicare and Medicaid Services includes, in addition to the face-to-face time of a patient visit (documented in the note above) non-face-to-face time: obtaining and reviewing outside history, ordering and reviewing medications, tests or procedures, care coordination (communications with other health care professionals or caregivers) and documentation in the medical record.  I, Melene Muller, am acting as scribe for Dr. Sullivan Lone, MD.  .I have reviewed the above documentation for accuracy and completeness, and I agree with the above. Brunetta Genera MD

## 2022-03-22 ENCOUNTER — Telehealth: Payer: Self-pay | Admitting: Hematology

## 2022-03-22 LAB — KAPPA/LAMBDA LIGHT CHAINS
Kappa free light chain: 12.3 mg/L (ref 3.3–19.4)
Kappa, lambda light chain ratio: 1.4 (ref 0.26–1.65)
Lambda free light chains: 8.8 mg/L (ref 5.7–26.3)

## 2022-03-22 NOTE — Telephone Encounter (Signed)
Scheduled follow-up appointments per 6/7 los. Patient is aware. 

## 2022-03-25 ENCOUNTER — Encounter: Payer: Self-pay | Admitting: Hematology

## 2022-03-26 LAB — MULTIPLE MYELOMA PANEL, SERUM
Albumin SerPl Elph-Mcnc: 3.6 g/dL (ref 2.9–4.4)
Albumin/Glob SerPl: 1.7 (ref 0.7–1.7)
Alpha 1: 0.2 g/dL (ref 0.0–0.4)
Alpha2 Glob SerPl Elph-Mcnc: 0.5 g/dL (ref 0.4–1.0)
B-Globulin SerPl Elph-Mcnc: 0.9 g/dL (ref 0.7–1.3)
Gamma Glob SerPl Elph-Mcnc: 0.6 g/dL (ref 0.4–1.8)
Globulin, Total: 2.2 g/dL (ref 2.2–3.9)
IgA: 33 mg/dL — ABNORMAL LOW (ref 87–352)
IgG (Immunoglobin G), Serum: 624 mg/dL (ref 586–1602)
IgM (Immunoglobulin M), Srm: 15 mg/dL — ABNORMAL LOW (ref 26–217)
Total Protein ELP: 5.8 g/dL — ABNORMAL LOW (ref 6.0–8.5)

## 2022-04-04 ENCOUNTER — Other Ambulatory Visit: Payer: Self-pay | Admitting: *Deleted

## 2022-04-04 ENCOUNTER — Inpatient Hospital Stay: Payer: Medicare Other

## 2022-04-04 ENCOUNTER — Other Ambulatory Visit: Payer: Self-pay

## 2022-04-04 ENCOUNTER — Ambulatory Visit: Payer: TRICARE For Life (TFL)

## 2022-04-04 VITALS — BP 140/80 | HR 69 | Temp 98.7°F | Resp 18 | Wt 201.5 lb

## 2022-04-04 DIAGNOSIS — Z86718 Personal history of other venous thrombosis and embolism: Secondary | ICD-10-CM | POA: Diagnosis not present

## 2022-04-04 DIAGNOSIS — Z95828 Presence of other vascular implants and grafts: Secondary | ICD-10-CM

## 2022-04-04 DIAGNOSIS — M8580 Other specified disorders of bone density and structure, unspecified site: Secondary | ICD-10-CM | POA: Diagnosis not present

## 2022-04-04 DIAGNOSIS — Z5112 Encounter for antineoplastic immunotherapy: Secondary | ICD-10-CM | POA: Diagnosis not present

## 2022-04-04 DIAGNOSIS — C9000 Multiple myeloma not having achieved remission: Secondary | ICD-10-CM | POA: Diagnosis not present

## 2022-04-04 DIAGNOSIS — Z7189 Other specified counseling: Secondary | ICD-10-CM

## 2022-04-04 DIAGNOSIS — C9001 Multiple myeloma in remission: Secondary | ICD-10-CM

## 2022-04-04 LAB — CBC WITH DIFFERENTIAL (CANCER CENTER ONLY)
Abs Immature Granulocytes: 0.01 10*3/uL (ref 0.00–0.07)
Basophils Absolute: 0 10*3/uL (ref 0.0–0.1)
Basophils Relative: 1 %
Eosinophils Absolute: 0 10*3/uL (ref 0.0–0.5)
Eosinophils Relative: 1 %
HCT: 31.7 % — ABNORMAL LOW (ref 36.0–46.0)
Hemoglobin: 10.4 g/dL — ABNORMAL LOW (ref 12.0–15.0)
Immature Granulocytes: 0 %
Lymphocytes Relative: 43 %
Lymphs Abs: 2.1 10*3/uL (ref 0.7–4.0)
MCH: 30.7 pg (ref 26.0–34.0)
MCHC: 32.8 g/dL (ref 30.0–36.0)
MCV: 93.5 fL (ref 80.0–100.0)
Monocytes Absolute: 0.5 10*3/uL (ref 0.1–1.0)
Monocytes Relative: 10 %
Neutro Abs: 2.3 10*3/uL (ref 1.7–7.7)
Neutrophils Relative %: 45 %
Platelet Count: 245 10*3/uL (ref 150–400)
RBC: 3.39 MIL/uL — ABNORMAL LOW (ref 3.87–5.11)
RDW: 14.5 % (ref 11.5–15.5)
WBC Count: 4.9 10*3/uL (ref 4.0–10.5)
nRBC: 0 % (ref 0.0–0.2)

## 2022-04-04 LAB — CMP (CANCER CENTER ONLY)
ALT: 20 U/L (ref 0–44)
AST: 20 U/L (ref 15–41)
Albumin: 4.2 g/dL (ref 3.5–5.0)
Alkaline Phosphatase: 58 U/L (ref 38–126)
Anion gap: 6 (ref 5–15)
BUN: 13 mg/dL (ref 8–23)
CO2: 24 mmol/L (ref 22–32)
Calcium: 9.1 mg/dL (ref 8.9–10.3)
Chloride: 110 mmol/L (ref 98–111)
Creatinine: 0.82 mg/dL (ref 0.44–1.00)
GFR, Estimated: >60 mL/min (ref >60)
Glucose, Bld: 90 mg/dL (ref 70–99)
Potassium: 4.1 mmol/L (ref 3.5–5.1)
Sodium: 140 mmol/L (ref 135–145)
Total Bilirubin: 0.9 mg/dL (ref 0.3–1.2)
Total Protein: 6.7 g/dL (ref 6.5–8.1)

## 2022-04-04 MED ORDER — PROCHLORPERAZINE MALEATE 10 MG PO TABS
10.0000 mg | ORAL_TABLET | Freq: Once | ORAL | Status: AC
  Administered 2022-04-04: 10 mg via ORAL
  Filled 2022-04-04: qty 1

## 2022-04-04 MED ORDER — SODIUM CHLORIDE 0.9 % IV SOLN
4.0000 mg | Freq: Once | INTRAVENOUS | Status: AC
  Administered 2022-04-04: 4 mg via INTRAVENOUS
  Filled 2022-04-04: qty 0.4

## 2022-04-04 MED ORDER — ACETAMINOPHEN 500 MG PO TABS
1000.0000 mg | ORAL_TABLET | Freq: Once | ORAL | Status: AC
  Administered 2022-04-04: 1000 mg via ORAL
  Filled 2022-04-04: qty 2

## 2022-04-04 MED ORDER — SODIUM CHLORIDE 0.9 % IV SOLN: Freq: Once | INTRAVENOUS | Status: AC

## 2022-04-04 MED ORDER — SODIUM CHLORIDE 0.9% FLUSH
10.0000 mL | Freq: Once | INTRAVENOUS | Status: AC
  Administered 2022-04-04: 10 mL

## 2022-04-04 MED ORDER — ZOLEDRONIC ACID 4 MG/100ML IV SOLN
4.0000 mg | Freq: Once | INTRAVENOUS | Status: AC
  Administered 2022-04-04: 4 mg via INTRAVENOUS
  Filled 2022-04-04: qty 100

## 2022-04-04 MED ORDER — HEPARIN SOD (PORK) LOCK FLUSH 100 UNIT/ML IV SOLN
500.0000 [IU] | Freq: Once | INTRAVENOUS | Status: AC | PRN
  Administered 2022-04-04: 500 [IU]

## 2022-04-04 MED ORDER — DEXTROSE 5 % IV SOLN
56.0000 mg/m2 | Freq: Once | INTRAVENOUS | Status: AC
  Administered 2022-04-04: 120 mg via INTRAVENOUS
  Filled 2022-04-04: qty 60

## 2022-04-04 MED ORDER — SODIUM CHLORIDE 0.9% FLUSH
10.0000 mL | INTRAVENOUS | Status: DC | PRN
  Administered 2022-04-04: 10 mL

## 2022-04-09 ENCOUNTER — Other Ambulatory Visit (HOSPITAL_COMMUNITY): Payer: Self-pay | Admitting: Family Medicine

## 2022-04-09 DIAGNOSIS — Z13820 Encounter for screening for osteoporosis: Secondary | ICD-10-CM

## 2022-04-13 ENCOUNTER — Other Ambulatory Visit (HOSPITAL_COMMUNITY): Payer: Medicare Other

## 2022-04-16 ENCOUNTER — Other Ambulatory Visit: Payer: Self-pay

## 2022-04-16 DIAGNOSIS — C9001 Multiple myeloma in remission: Secondary | ICD-10-CM

## 2022-04-18 ENCOUNTER — Other Ambulatory Visit (HOSPITAL_COMMUNITY): Payer: Medicare Other

## 2022-04-18 ENCOUNTER — Inpatient Hospital Stay: Payer: TRICARE For Life (TFL)

## 2022-04-18 ENCOUNTER — Inpatient Hospital Stay: Payer: Medicare Other | Attending: Hematology

## 2022-04-18 ENCOUNTER — Other Ambulatory Visit: Payer: Self-pay

## 2022-04-18 VITALS — BP 143/83 | HR 70 | Temp 98.3°F | Resp 18 | Wt 204.5 lb

## 2022-04-18 DIAGNOSIS — C9000 Multiple myeloma not having achieved remission: Secondary | ICD-10-CM | POA: Insufficient documentation

## 2022-04-18 DIAGNOSIS — Z5112 Encounter for antineoplastic immunotherapy: Secondary | ICD-10-CM | POA: Insufficient documentation

## 2022-04-18 DIAGNOSIS — Z95828 Presence of other vascular implants and grafts: Secondary | ICD-10-CM

## 2022-04-18 DIAGNOSIS — C9001 Multiple myeloma in remission: Secondary | ICD-10-CM

## 2022-04-18 DIAGNOSIS — Z7189 Other specified counseling: Secondary | ICD-10-CM

## 2022-04-18 LAB — CBC WITH DIFFERENTIAL (CANCER CENTER ONLY)
Abs Immature Granulocytes: 0.01 10*3/uL (ref 0.00–0.07)
Basophils Absolute: 0 10*3/uL (ref 0.0–0.1)
Basophils Relative: 1 %
Eosinophils Absolute: 0 10*3/uL (ref 0.0–0.5)
Eosinophils Relative: 1 %
HCT: 29.9 % — ABNORMAL LOW (ref 36.0–46.0)
Hemoglobin: 9.8 g/dL — ABNORMAL LOW (ref 12.0–15.0)
Immature Granulocytes: 0 %
Lymphocytes Relative: 45 %
Lymphs Abs: 1.9 10*3/uL (ref 0.7–4.0)
MCH: 30.9 pg (ref 26.0–34.0)
MCHC: 32.8 g/dL (ref 30.0–36.0)
MCV: 94.3 fL (ref 80.0–100.0)
Monocytes Absolute: 0.5 10*3/uL (ref 0.1–1.0)
Monocytes Relative: 13 %
Neutro Abs: 1.7 10*3/uL (ref 1.7–7.7)
Neutrophils Relative %: 40 %
Platelet Count: 206 10*3/uL (ref 150–400)
RBC: 3.17 MIL/uL — ABNORMAL LOW (ref 3.87–5.11)
RDW: 14.5 % (ref 11.5–15.5)
WBC Count: 4.2 10*3/uL (ref 4.0–10.5)
nRBC: 0 % (ref 0.0–0.2)

## 2022-04-18 LAB — CMP (CANCER CENTER ONLY)
ALT: 20 U/L (ref 0–44)
AST: 21 U/L (ref 15–41)
Albumin: 3.9 g/dL (ref 3.5–5.0)
Alkaline Phosphatase: 56 U/L (ref 38–126)
Anion gap: 4 — ABNORMAL LOW (ref 5–15)
BUN: 17 mg/dL (ref 8–23)
CO2: 24 mmol/L (ref 22–32)
Calcium: 8.7 mg/dL — ABNORMAL LOW (ref 8.9–10.3)
Chloride: 111 mmol/L (ref 98–111)
Creatinine: 0.8 mg/dL (ref 0.44–1.00)
GFR, Estimated: >60 mL/min (ref >60)
Glucose, Bld: 72 mg/dL (ref 70–99)
Potassium: 3.8 mmol/L (ref 3.5–5.1)
Sodium: 139 mmol/L (ref 135–145)
Total Bilirubin: 0.7 mg/dL (ref 0.3–1.2)
Total Protein: 6.4 g/dL — ABNORMAL LOW (ref 6.5–8.1)

## 2022-04-18 MED ORDER — ACETAMINOPHEN 500 MG PO TABS
1000.0000 mg | ORAL_TABLET | Freq: Once | ORAL | Status: AC
  Administered 2022-04-18: 1000 mg via ORAL
  Filled 2022-04-18: qty 2

## 2022-04-18 MED ORDER — DEXTROSE 5 % IV SOLN
56.0000 mg/m2 | Freq: Once | INTRAVENOUS | Status: AC
  Administered 2022-04-18: 120 mg via INTRAVENOUS
  Filled 2022-04-18: qty 60

## 2022-04-18 MED ORDER — SODIUM CHLORIDE 0.9 % IV SOLN
4.0000 mg | Freq: Once | INTRAVENOUS | Status: AC
  Administered 2022-04-18: 4 mg via INTRAVENOUS
  Filled 2022-04-18: qty 0.4

## 2022-04-18 MED ORDER — SODIUM CHLORIDE 0.9% FLUSH
10.0000 mL | Freq: Once | INTRAVENOUS | Status: AC
  Administered 2022-04-18: 10 mL

## 2022-04-18 MED ORDER — SODIUM CHLORIDE 0.9% FLUSH
10.0000 mL | INTRAVENOUS | Status: DC | PRN
  Administered 2022-04-18: 10 mL

## 2022-04-18 MED ORDER — SODIUM CHLORIDE 0.9 % IV SOLN: Freq: Once | INTRAVENOUS | Status: DC

## 2022-04-18 MED ORDER — SODIUM CHLORIDE 0.9 % IV SOLN: Freq: Once | INTRAVENOUS | Status: AC

## 2022-04-18 MED ORDER — HEPARIN SOD (PORK) LOCK FLUSH 100 UNIT/ML IV SOLN
500.0000 [IU] | Freq: Once | INTRAVENOUS | Status: AC | PRN
  Administered 2022-04-18: 500 [IU]

## 2022-04-18 MED ORDER — PROCHLORPERAZINE MALEATE 10 MG PO TABS
10.0000 mg | ORAL_TABLET | Freq: Once | ORAL | Status: AC
  Administered 2022-04-18: 10 mg via ORAL
  Filled 2022-04-18: qty 1

## 2022-04-19 ENCOUNTER — Ambulatory Visit (HOSPITAL_COMMUNITY)
Admission: RE | Admit: 2022-04-19 | Discharge: 2022-04-19 | Disposition: A | Discharge status: Home / Self Care | Payer: Medicare Other | Source: Ambulatory Visit | Attending: Family Medicine | Admitting: Family Medicine

## 2022-04-19 DIAGNOSIS — Z780 Asymptomatic menopausal state: Secondary | ICD-10-CM | POA: Diagnosis not present

## 2022-04-19 DIAGNOSIS — M8589 Other specified disorders of bone density and structure, multiple sites: Secondary | ICD-10-CM | POA: Insufficient documentation

## 2022-04-19 DIAGNOSIS — E559 Vitamin D deficiency, unspecified: Secondary | ICD-10-CM | POA: Insufficient documentation

## 2022-04-19 DIAGNOSIS — Z13820 Encounter for screening for osteoporosis: Secondary | ICD-10-CM | POA: Insufficient documentation

## 2022-04-19 LAB — KAPPA/LAMBDA LIGHT CHAINS
Kappa free light chain: 13.2 mg/L (ref 3.3–19.4)
Kappa, lambda light chain ratio: 1.4 (ref 0.26–1.65)
Lambda free light chains: 9.4 mg/L (ref 5.7–26.3)

## 2022-04-24 LAB — MULTIPLE MYELOMA PANEL, SERUM
Albumin SerPl Elph-Mcnc: 3.4 g/dL (ref 2.9–4.4)
Albumin/Glob SerPl: 1.5 (ref 0.7–1.7)
Alpha 1: 0.2 g/dL (ref 0.0–0.4)
Alpha2 Glob SerPl Elph-Mcnc: 0.5 g/dL (ref 0.4–1.0)
B-Globulin SerPl Elph-Mcnc: 1 g/dL (ref 0.7–1.3)
Gamma Glob SerPl Elph-Mcnc: 0.6 g/dL (ref 0.4–1.8)
Globulin, Total: 2.4 g/dL (ref 2.2–3.9)
IgA: 41 mg/dL — ABNORMAL LOW (ref 87–352)
IgG (Immunoglobin G), Serum: 643 mg/dL (ref 586–1602)
IgM (Immunoglobulin M), Srm: 14 mg/dL — ABNORMAL LOW (ref 26–217)
Total Protein ELP: 5.8 g/dL — ABNORMAL LOW (ref 6.0–8.5)

## 2022-04-25 ENCOUNTER — Inpatient Hospital Stay: Payer: Medicare Other

## 2022-04-25 ENCOUNTER — Other Ambulatory Visit: Payer: Self-pay | Admitting: Hematology

## 2022-05-01 ENCOUNTER — Other Ambulatory Visit: Payer: Self-pay

## 2022-05-01 DIAGNOSIS — C9000 Multiple myeloma not having achieved remission: Secondary | ICD-10-CM

## 2022-05-02 ENCOUNTER — Other Ambulatory Visit: Payer: Self-pay

## 2022-05-02 ENCOUNTER — Inpatient Hospital Stay: Payer: Medicare Other

## 2022-05-02 VITALS — BP 159/82 | HR 67 | Temp 98.4°F | Resp 16 | Ht 64.0 in | Wt 206.1 lb

## 2022-05-02 DIAGNOSIS — Z95828 Presence of other vascular implants and grafts: Secondary | ICD-10-CM

## 2022-05-02 DIAGNOSIS — Z7189 Other specified counseling: Secondary | ICD-10-CM

## 2022-05-02 DIAGNOSIS — C9000 Multiple myeloma not having achieved remission: Secondary | ICD-10-CM

## 2022-05-02 DIAGNOSIS — Z5112 Encounter for antineoplastic immunotherapy: Secondary | ICD-10-CM | POA: Diagnosis not present

## 2022-05-02 LAB — CBC WITH DIFFERENTIAL (CANCER CENTER ONLY)
Abs Immature Granulocytes: 0.01 10*3/uL (ref 0.00–0.07)
Basophils Absolute: 0 10*3/uL (ref 0.0–0.1)
Basophils Relative: 0 %
Eosinophils Absolute: 0.1 10*3/uL (ref 0.0–0.5)
Eosinophils Relative: 1 %
HCT: 29 % — ABNORMAL LOW (ref 36.0–46.0)
Hemoglobin: 9.8 g/dL — ABNORMAL LOW (ref 12.0–15.0)
Immature Granulocytes: 0 %
Lymphocytes Relative: 41 %
Lymphs Abs: 1.8 10*3/uL (ref 0.7–4.0)
MCH: 31.4 pg (ref 26.0–34.0)
MCHC: 33.8 g/dL (ref 30.0–36.0)
MCV: 92.9 fL (ref 80.0–100.0)
Monocytes Absolute: 0.5 10*3/uL (ref 0.1–1.0)
Monocytes Relative: 10 %
Neutro Abs: 2.1 10*3/uL (ref 1.7–7.7)
Neutrophils Relative %: 48 %
Platelet Count: 231 10*3/uL (ref 150–400)
RBC: 3.12 MIL/uL — ABNORMAL LOW (ref 3.87–5.11)
RDW: 14.7 % (ref 11.5–15.5)
WBC Count: 4.5 10*3/uL (ref 4.0–10.5)
nRBC: 0 % (ref 0.0–0.2)

## 2022-05-02 LAB — CMP (CANCER CENTER ONLY)
ALT: 25 U/L (ref 0–44)
AST: 27 U/L (ref 15–41)
Albumin: 4 g/dL (ref 3.5–5.0)
Alkaline Phosphatase: 59 U/L (ref 38–126)
Anion gap: 5 (ref 5–15)
BUN: 11 mg/dL (ref 8–23)
CO2: 24 mmol/L (ref 22–32)
Calcium: 9.1 mg/dL (ref 8.9–10.3)
Chloride: 110 mmol/L (ref 98–111)
Creatinine: 0.72 mg/dL (ref 0.44–1.00)
GFR, Estimated: >60 mL/min (ref >60)
Glucose, Bld: 88 mg/dL (ref 70–99)
Potassium: 3.8 mmol/L (ref 3.5–5.1)
Sodium: 139 mmol/L (ref 135–145)
Total Bilirubin: 0.7 mg/dL (ref 0.3–1.2)
Total Protein: 6.3 g/dL — ABNORMAL LOW (ref 6.5–8.1)

## 2022-05-02 MED ORDER — SODIUM CHLORIDE 0.9 % IV SOLN: Freq: Once | INTRAVENOUS | Status: AC

## 2022-05-02 MED ORDER — DEXTROSE 5 % IV SOLN
56.0000 mg/m2 | Freq: Once | INTRAVENOUS | Status: AC
  Administered 2022-05-02: 120 mg via INTRAVENOUS
  Filled 2022-05-02: qty 60

## 2022-05-02 MED ORDER — HEPARIN SOD (PORK) LOCK FLUSH 100 UNIT/ML IV SOLN
500.0000 [IU] | Freq: Once | INTRAVENOUS | Status: AC | PRN
  Administered 2022-05-02: 500 [IU]

## 2022-05-02 MED ORDER — SODIUM CHLORIDE 0.9 % IV SOLN
4.0000 mg | Freq: Once | INTRAVENOUS | Status: AC
  Administered 2022-05-02: 4 mg via INTRAVENOUS
  Filled 2022-05-02: qty 0.4

## 2022-05-02 MED ORDER — SODIUM CHLORIDE 0.9% FLUSH
10.0000 mL | INTRAVENOUS | Status: DC | PRN
  Administered 2022-05-02: 10 mL

## 2022-05-02 MED ORDER — SODIUM CHLORIDE 0.9% FLUSH
10.0000 mL | Freq: Once | INTRAVENOUS | Status: AC
  Administered 2022-05-02: 10 mL

## 2022-05-02 MED ORDER — PROCHLORPERAZINE MALEATE 10 MG PO TABS
10.0000 mg | ORAL_TABLET | Freq: Once | ORAL | Status: AC
  Administered 2022-05-02: 10 mg via ORAL
  Filled 2022-05-02: qty 1

## 2022-05-02 MED ORDER — ACETAMINOPHEN 500 MG PO TABS
1000.0000 mg | ORAL_TABLET | Freq: Once | ORAL | Status: AC
  Administered 2022-05-02: 1000 mg via ORAL
  Filled 2022-05-02: qty 2

## 2022-05-02 NOTE — Patient Instructions (Signed)
[facility]COL[facility]  Discharge Instructions: Thank you for choosing [facility] to provide your oncology and hematology care.   If you have a lab appointment with the [facility], please go directly to the [facility] and check in at the registration area.   Wear comfortable clothing and clothing appropriate for easy access to any Portacath or PICC line.   We strive to give you quality time with your provider. You may need to reschedule your appointment if you arrive late (15 or more minutes).  Arriving late affects you and other patients whose appointments are after yours.  Also, if you miss three or more appointments without notifying the office, you may be dismissed from the clinic at the provider's discretion.      For prescription refill requests, have your pharmacy contact our office and allow 72 hours for refills to be completed.    Today you received the following chemotherapy and/or immunotherapy agent: Kyprolis      To help prevent nausea and vomiting after your treatment, we encourage you to take your nausea medication as directed.  BELOW ARE SYMPTOMS THAT SHOULD BE REPORTED IMMEDIATELY: *FEVER GREATER THAN 100.4 F (38 C) OR HIGHER *CHILLS OR SWEATING *NAUSEA AND VOMITING THAT IS NOT CONTROLLED WITH YOUR NAUSEA MEDICATION *UNUSUAL SHORTNESS OF BREATH *UNUSUAL BRUISING OR BLEEDING *URINARY PROBLEMS (pain or burning when urinating, or frequent urination) *BOWEL PROBLEMS (unusual diarrhea, constipation, pain near the anus) TENDERNESS IN MOUTH AND THROAT WITH OR WITHOUT PRESENCE OF ULCERS (sore throat, sores in mouth, or a toothache) UNUSUAL RASH, SWELLING OR PAIN  UNUSUAL VAGINAL DISCHARGE OR ITCHING   Items with * indicate a potential emergency and should be followed up as soon as possible or go to the Emergency Department if any problems should occur.  Please show the CHEMOTHERAPY ALERT CARD or IMMUNOTHERAPY ALERT CARD at check-in to  the Emergency Department and triage nurse.  Should you have questions after your visit or need to cancel or reschedule your appointment, please contact [facility]  Dept: [phone]  and follow the prompts.  Office hours are 8:00 a.m. to 4:30 p.m. Monday - Friday. Please note that voicemails left after 4:00 p.m. may not be returned until the following business day.  We are closed weekends and major holidays. You have access to a nurse at all times for urgent questions. Please call the main number to the clinic Dept: [phone] and follow the prompts.   For any non-urgent questions, you may also contact your provider using MyChart. We now offer e-Visits for anyone 88 and older to request care online for non-urgent symptoms. For details visit [email or web address].   Also download the MyChart app! Go to the app store, search "MyChart", open the app, select , and log in with your MyChart username and password.  Masks are optional in the cancer centers. If you would like for your care team to wear a mask while they are taking care of you, please let them know. For doctor visits, patients may have with them one support person who is at least 70 years old. At this time, visitors are not allowed in the infusion area.

## 2022-05-07 ENCOUNTER — Other Ambulatory Visit: Payer: Self-pay

## 2022-05-14 ENCOUNTER — Other Ambulatory Visit: Payer: Self-pay

## 2022-05-14 DIAGNOSIS — C9000 Multiple myeloma not having achieved remission: Secondary | ICD-10-CM

## 2022-05-15 ENCOUNTER — Other Ambulatory Visit: Payer: Self-pay

## 2022-05-15 ENCOUNTER — Inpatient Hospital Stay: Payer: Medicare Other | Attending: Hematology

## 2022-05-15 ENCOUNTER — Inpatient Hospital Stay: Payer: Medicare Other

## 2022-05-15 ENCOUNTER — Inpatient Hospital Stay (HOSPITAL_BASED_OUTPATIENT_CLINIC_OR_DEPARTMENT_OTHER): Payer: Medicare Other | Admitting: Hematology

## 2022-05-15 VITALS — BP 142/71 | HR 88 | Temp 98.1°F | Resp 17 | Ht 64.0 in | Wt 208.4 lb

## 2022-05-15 DIAGNOSIS — Z5112 Encounter for antineoplastic immunotherapy: Secondary | ICD-10-CM | POA: Insufficient documentation

## 2022-05-15 DIAGNOSIS — C9000 Multiple myeloma not having achieved remission: Secondary | ICD-10-CM

## 2022-05-15 DIAGNOSIS — Z452 Encounter for adjustment and management of vascular access device: Secondary | ICD-10-CM | POA: Diagnosis not present

## 2022-05-15 DIAGNOSIS — G629 Polyneuropathy, unspecified: Secondary | ICD-10-CM | POA: Diagnosis not present

## 2022-05-15 DIAGNOSIS — D72819 Decreased white blood cell count, unspecified: Secondary | ICD-10-CM | POA: Diagnosis not present

## 2022-05-15 DIAGNOSIS — D649 Anemia, unspecified: Secondary | ICD-10-CM

## 2022-05-15 DIAGNOSIS — M1990 Unspecified osteoarthritis, unspecified site: Secondary | ICD-10-CM | POA: Insufficient documentation

## 2022-05-15 DIAGNOSIS — D539 Nutritional anemia, unspecified: Secondary | ICD-10-CM | POA: Diagnosis not present

## 2022-05-15 DIAGNOSIS — Z95828 Presence of other vascular implants and grafts: Secondary | ICD-10-CM

## 2022-05-15 DIAGNOSIS — Z7189 Other specified counseling: Secondary | ICD-10-CM

## 2022-05-15 LAB — CMP (CANCER CENTER ONLY)
ALT: 20 U/L (ref 0–44)
AST: 21 U/L (ref 15–41)
Albumin: 4 g/dL (ref 3.5–5.0)
Alkaline Phosphatase: 60 U/L (ref 38–126)
Anion gap: 6 (ref 5–15)
BUN: 13 mg/dL (ref 8–23)
CO2: 23 mmol/L (ref 22–32)
Calcium: 8.5 mg/dL — ABNORMAL LOW (ref 8.9–10.3)
Chloride: 112 mmol/L — ABNORMAL HIGH (ref 98–111)
Creatinine: 0.78 mg/dL (ref 0.44–1.00)
GFR, Estimated: >60 mL/min (ref >60)
Glucose, Bld: 85 mg/dL (ref 70–99)
Potassium: 4.1 mmol/L (ref 3.5–5.1)
Sodium: 141 mmol/L (ref 135–145)
Total Bilirubin: 0.6 mg/dL (ref 0.3–1.2)
Total Protein: 6.3 g/dL — ABNORMAL LOW (ref 6.5–8.1)

## 2022-05-15 LAB — CBC WITH DIFFERENTIAL (CANCER CENTER ONLY)
Abs Immature Granulocytes: 0.01 10*3/uL (ref 0.00–0.07)
Basophils Absolute: 0 10*3/uL (ref 0.0–0.1)
Basophils Relative: 1 %
Eosinophils Absolute: 0.1 10*3/uL (ref 0.0–0.5)
Eosinophils Relative: 2 %
HCT: 30 % — ABNORMAL LOW (ref 36.0–46.0)
Hemoglobin: 9.7 g/dL — ABNORMAL LOW (ref 12.0–15.0)
Immature Granulocytes: 0 %
Lymphocytes Relative: 46 %
Lymphs Abs: 1.8 10*3/uL (ref 0.7–4.0)
MCH: 30.5 pg (ref 26.0–34.0)
MCHC: 32.3 g/dL (ref 30.0–36.0)
MCV: 94.3 fL (ref 80.0–100.0)
Monocytes Absolute: 0.5 10*3/uL (ref 0.1–1.0)
Monocytes Relative: 12 %
Neutro Abs: 1.5 10*3/uL — ABNORMAL LOW (ref 1.7–7.7)
Neutrophils Relative %: 39 %
Platelet Count: 210 10*3/uL (ref 150–400)
RBC: 3.18 MIL/uL — ABNORMAL LOW (ref 3.87–5.11)
RDW: 15.3 % (ref 11.5–15.5)
WBC Count: 3.9 10*3/uL — ABNORMAL LOW (ref 4.0–10.5)
nRBC: 0 % (ref 0.0–0.2)

## 2022-05-15 LAB — VITAMIN B12: Vitamin B-12: 902 pg/mL (ref 180–914)

## 2022-05-15 LAB — IRON AND IRON BINDING CAPACITY (CC-WL,HP ONLY)
Iron: 24 ug/dL — ABNORMAL LOW (ref 28–170)
Saturation Ratios: 5 % — ABNORMAL LOW (ref 10.4–31.8)
TIBC: 452 ug/dL — ABNORMAL HIGH (ref 250–450)
UIBC: 428 ug/dL (ref 148–442)

## 2022-05-15 LAB — FERRITIN: Ferritin: 11 ng/mL (ref 11–307)

## 2022-05-15 MED ORDER — SODIUM CHLORIDE 0.9 % IV SOLN: Freq: Once | INTRAVENOUS | Status: AC

## 2022-05-15 MED ORDER — HEPARIN SOD (PORK) LOCK FLUSH 100 UNIT/ML IV SOLN
500.0000 [IU] | Freq: Once | INTRAVENOUS | Status: AC | PRN
  Administered 2022-05-15: 500 [IU]

## 2022-05-15 MED ORDER — SODIUM CHLORIDE 0.9% FLUSH
10.0000 mL | INTRAVENOUS | Status: DC | PRN
  Administered 2022-05-15: 10 mL

## 2022-05-15 MED ORDER — SODIUM CHLORIDE 0.9% FLUSH
10.0000 mL | Freq: Once | INTRAVENOUS | Status: AC
  Administered 2022-05-15: 10 mL

## 2022-05-15 MED ORDER — SODIUM CHLORIDE 0.9 % IV SOLN
4.0000 mg | Freq: Once | INTRAVENOUS | Status: AC
  Administered 2022-05-15: 4 mg via INTRAVENOUS
  Filled 2022-05-15: qty 0.4

## 2022-05-15 MED ORDER — PROCHLORPERAZINE MALEATE 10 MG PO TABS
10.0000 mg | ORAL_TABLET | Freq: Once | ORAL | Status: AC
  Administered 2022-05-15: 10 mg via ORAL
  Filled 2022-05-15: qty 1

## 2022-05-15 MED ORDER — DEXTROSE 5 % IV SOLN
56.0000 mg/m2 | Freq: Once | INTRAVENOUS | Status: AC
  Administered 2022-05-15: 120 mg via INTRAVENOUS
  Filled 2022-05-15: qty 60

## 2022-05-15 MED ORDER — ACETAMINOPHEN 500 MG PO TABS
1000.0000 mg | ORAL_TABLET | Freq: Once | ORAL | Status: AC
  Administered 2022-05-15: 1000 mg via ORAL
  Filled 2022-05-15: qty 2

## 2022-05-15 NOTE — Patient Instructions (Signed)
[facility]COL[facility]  Discharge Instructions: Thank you for choosing [facility] to provide your oncology and hematology care.   If you have a lab appointment with the [facility], please go directly to the [facility] and check in at the registration area.   Wear comfortable clothing and clothing appropriate for easy access to any Portacath or PICC line.   We strive to give you quality time with your provider. You may need to reschedule your appointment if you arrive late (15 or more minutes).  Arriving late affects you and other patients whose appointments are after yours.  Also, if you miss three or more appointments without notifying the office, you may be dismissed from the clinic at the provider's discretion.      For prescription refill requests, have your pharmacy contact our office and allow 72 hours for refills to be completed.    Today you received the following chemotherapy and/or immunotherapy agent: Kyprolis      To help prevent nausea and vomiting after your treatment, we encourage you to take your nausea medication as directed.  BELOW ARE SYMPTOMS THAT SHOULD BE REPORTED IMMEDIATELY: *FEVER GREATER THAN 100.4 F (38 C) OR HIGHER *CHILLS OR SWEATING *NAUSEA AND VOMITING THAT IS NOT CONTROLLED WITH YOUR NAUSEA MEDICATION *UNUSUAL SHORTNESS OF BREATH *UNUSUAL BRUISING OR BLEEDING *URINARY PROBLEMS (pain or burning when urinating, or frequent urination) *BOWEL PROBLEMS (unusual diarrhea, constipation, pain near the anus) TENDERNESS IN MOUTH AND THROAT WITH OR WITHOUT PRESENCE OF ULCERS (sore throat, sores in mouth, or a toothache) UNUSUAL RASH, SWELLING OR PAIN  UNUSUAL VAGINAL DISCHARGE OR ITCHING   Items with * indicate a potential emergency and should be followed up as soon as possible or go to the Emergency Department if any problems should occur.  Please show the CHEMOTHERAPY ALERT CARD or IMMUNOTHERAPY ALERT CARD at check-in to  the Emergency Department and triage nurse.  Should you have questions after your visit or need to cancel or reschedule your appointment, please contact [facility]  Dept: [phone]  and follow the prompts.  Office hours are 8:00 a.m. to 4:30 p.m. Monday - Friday. Please note that voicemails left after 4:00 p.m. may not be returned until the following business day.  We are closed weekends and major holidays. You have access to a nurse at all times for urgent questions. Please call the main number to the clinic Dept: [phone] and follow the prompts.   For any non-urgent questions, you may also contact your provider using MyChart. We now offer e-Visits for anyone 72 and older to request care online for non-urgent symptoms. For details visit [email or web address].   Also download the MyChart app! Go to the app store, search "MyChart", open the app, select [facility], and log in with your MyChart username and password.  Masks are optional in the cancer centers. If you would like for your care team to wear a mask while they are taking care of you, please let them know. For doctor visits, patients may have with them one support Darek Eifler who is at least 70 years old. At this time, visitors are not allowed in the infusion area.

## 2022-05-16 LAB — KAPPA/LAMBDA LIGHT CHAINS
Kappa free light chain: 11.8 mg/L (ref 3.3–19.4)
Kappa, lambda light chain ratio: 1.4 (ref 0.26–1.65)
Lambda free light chains: 8.4 mg/L (ref 5.7–26.3)

## 2022-05-17 LAB — MULTIPLE MYELOMA PANEL, SERUM
Albumin SerPl Elph-Mcnc: 3.4 g/dL (ref 2.9–4.4)
Albumin/Glob SerPl: 1.5 (ref 0.7–1.7)
Alpha 1: 0.2 g/dL (ref 0.0–0.4)
Alpha2 Glob SerPl Elph-Mcnc: 0.7 g/dL (ref 0.4–1.0)
B-Globulin SerPl Elph-Mcnc: 0.9 g/dL (ref 0.7–1.3)
Gamma Glob SerPl Elph-Mcnc: 0.6 g/dL (ref 0.4–1.8)
Globulin, Total: 2.4 g/dL (ref 2.2–3.9)
IgA: 37 mg/dL — ABNORMAL LOW (ref 87–352)
IgG (Immunoglobin G), Serum: 666 mg/dL (ref 586–1602)
IgM (Immunoglobulin M), Srm: 12 mg/dL — ABNORMAL LOW (ref 26–217)
Total Protein ELP: 5.8 g/dL — ABNORMAL LOW (ref 6.0–8.5)

## 2022-05-18 ENCOUNTER — Telehealth: Payer: Self-pay | Admitting: Hematology

## 2022-05-18 NOTE — Telephone Encounter (Signed)
Scheduled follow-up appointments per 8/1 los. Patient is aware.

## 2022-05-22 ENCOUNTER — Telehealth: Payer: Self-pay

## 2022-05-22 ENCOUNTER — Encounter: Payer: Self-pay | Admitting: Hematology

## 2022-05-22 NOTE — Progress Notes (Addendum)
   HEMATOLOGY/ONCOLOGY CLINIC NOTE  Date of Service: .05/15/2022   PCP: Erik Butler MD  CHIEF COMPLAINTS/PURPOSE OF CONSULTATION:  Follow-up for continued evaluation and management of multiple myeloma  HISTORY OF PRESENTING ILLNESS:  See previous note for details on initial presentation  INTERVAL HISTORY:  Madison Parker is a 70 y.o. female for continued evaluation and management of multiple myeloma. She notes no acute new concerns since her last clinic visit.  She notes persistent grade 1 through 2 fatigue and joint pains related to her osteoarthritis.  She notes that she is feeling physically and emotionally fatigued and would like to take a break from her treatment for a few months. No new infection issues. Labs reviewed.  Were discussed in detail with the patient.   MEDICAL HISTORY:  Past Medical History:  Diagnosis Date   Headache    Low back pain    SBO (small bowel obstruction) ([facility]) 2010   Smoldering multiple myeloma   Previous history of hypothyroidism- not currently medications Anxiety Obesity .Body mass index is 35.77 kg/m. 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 Lumbosacral radiculopathy Left hip pain  SURGICAL HISTORY: Past Surgical History:  Procedure Laterality Date   ABDOMINAL HYSTERECTOMY     complete   BACK SURGERY     lower at [facility]   COLONOSCOPY WITH PROPOFOL N/A 08/23/2020   Procedure: COLONOSCOPY WITH PROPOFOL;  Surgeon: Castaneda Mayorga, Daniel, MD;  [facility];  Service: Gastroenterology;  Laterality: N/A;  900   colonscopy  2014   GASTRIC BYPASS  yrs ago   HERNIA REPAIR     IR IMAGING GUIDED PORT INSERTION  04/20/2019   KNEE SURGERY  06/04/2009   both knees replaced    POLYPECTOMY  08/23/2020   Procedure: POLYPECTOMY;  Surgeon: Castaneda Mayorga, Daniel, MD;  [facility];  Service: Gastroenterology;;   TONSILLECTOMY  age 30   TOTAL HIP ARTHROPLASTY  Left 08/16/2017   Procedure: LEFT TOTAL HIP ARTHROPLASTY ANTERIOR APPROACH;  Surgeon: Graves, John, MD;  Location: [facility];  Service: Orthopedics;  Laterality: Left;  Spinal surgery April 2017  SOCIAL HISTORY: Social History   Socioeconomic History   Marital status: Married    Spouse name: Not on file   Number of children: Not on file   Years of education: Not on file   Highest education level: Not on file  Occupational History   Not on file  Tobacco Use   Smoking status: Former    Packs/day: 0.50    Years: 10.00    Total pack years: 5.00    Types: Cigarettes   Smokeless tobacco: Never   Tobacco comments:    quit 1977  Vaping Use   Vaping Use: Never used  Substance and Sexual Activity   Alcohol use: Yes    Comment: occ   Drug use: No   Sexual activity: Not on file  Other Topics Concern   Not on file  Social History Narrative   Not on file   Social Determinants of Health   Financial Resource Strain: Not on file  Food Insecurity: Not on file  Transportation Needs: Not on file  Physical Activity: Not on file  Stress: Not on file  Social Connections: Not on file  Intimate Partner Violence: Not At Risk (03/22/2021)   Humiliation, Afraid, Rape, and Kick questionnaire    Fear of Current or Ex-Partner: No    Emotionally Abused: No    Physically Abused: No      Sexually Abused: No  Occasional alcohol use Former smoker and smoked 1 pack per week for about 9 years has since quit.  FAMILY HISTORY: Family History  Problem Relation Age of Onset   Breast cancer Neg Hx     ALLERGIES:  is allergic to codeine.  MEDICATIONS:  Current Outpatient Medications  Medication Sig Dispense Refill   acyclovir (ZOVIRAX) 800 MG tablet Take 0.5 tablets (400 mg total) by mouth 2 (two) times daily. 60 tablet 11   Calcium Carbonate-Vitamin D 600-200 MG-UNIT TABS Calcium 600 with Vitamin D3 600 mg-5 mcg (200 unit) tablet  Take 1 tablet every day by oral route as directed.     Cholecalciferol  1.25 MG (50000 UT) TABS Take by mouth.     fluconazole (DIFLUCAN) 100 MG tablet Take 1 tablet (100 mg total) by mouth daily. 10 tablet 0   FOLIC ACID PO Take 1,360 mg by mouth daily.      lidocaine-prilocaine (EMLA) cream Apply 1 application. topically as needed. 30 g 2   NYSTATIN powder Apply topically 2 (two) times daily. (Patient not taking: Reported on 12/27/2021) 15 g 1   Probiotic Product (PROBIOTIC DAILY PO) Take 2 capsules by mouth daily. Gummies     vitamin B-12 (CYANOCOBALAMIN) 1000 MCG tablet Take 1,000 mcg by mouth daily.      Vitamin D, Ergocalciferol, (DRISDOL) 1.25 MG (50000 UNIT) CAPS capsule TAKE 1 CAPSULE ONCE A WEEK (Patient taking differently: Take 50,000 Units by mouth every 7 (seven) days. Sunday) 13 capsule 3   No current facility-administered medications for this visit.   Facility-Administered Medications Ordered in Other Visits  Medication Dose Route Frequency Provider Last Rate Last Admin   sodium chloride flush (NS) 0.9 % injection 10 mL  10 mL Intracatheter PRN Kale, Gautam Kishore, MD   10 mL at 05/11/19 1437    REVIEW OF SYSTEMS:  10 Point review of Systems was done is negative except as noted above.  PHYSICAL EXAMINATION: ECOG FS:1 - Symptomatic but completely ambulatory  Vitals:   05/15/22 0934  BP: (!) 142/71  Pulse: 88  Resp: 17  Temp: 98.1 F (36.7 C)  SpO2: 99%   Wt Readings from Last 3 Encounters:  05/15/22 208 lb 6.4 oz (94.5 kg)  05/02/22 206 lb 1.9 oz (93.5 kg)  04/18/22 204 lb 8 oz (92.8 kg)   NAD GENERAL:alert, in no acute distress and comfortable SKIN: no acute rashes, no significant lesions EYES: conjunctiva are pink and non-injected, sclera anicteric OROPHARYNX: MMM, no exudates, no oropharyngeal erythema or ulceration NECK: supple, no JVD LYMPH:  no palpable lymphadenopathy in the cervical, axillary or inguinal regions LUNGS: clear to auscultation b/l with normal respiratory effort HEART: regular rate & rhythm ABDOMEN:   normoactive bowel sounds , non tender, not distended. Extremity: no pedal edema PSYCH: alert & oriented x 3 with fluent speech NEURO: no focal motor/sensory deficits  LABORATORY DATA:  I have reviewed the data as listed  .    Latest Ref Rng & Units 05/15/2022    9:26 AM 05/02/2022    9:05 AM 04/18/2022    8:38 AM  CBC  WBC 4.0 - 10.5 K/uL 3.9  4.5  4.2   Hemoglobin 12.0 - 15.0 g/dL 9.7  9.8  9.8   Hematocrit 36.0 - 46.0 % 30.0  29.0  29.9   Platelets 150 - 400 K/uL 210  231  206    CBC    Component Value Date/Time   WBC 3.9 (L) 05/15/2022 0926     WBC 3.3 (L) 10/04/2021 0902   RBC 3.18 (L) 05/15/2022 0926   HGB 9.7 (L) 05/15/2022 0926   HGB 11.3 (L) 09/30/2017 1145   HCT 30.0 (L) 05/15/2022 0926   HCT 35.1 09/30/2017 1145   PLT 210 05/15/2022 0926   PLT 172 09/30/2017 1145   MCV 94.3 05/15/2022 0926   MCV 97.5 09/30/2017 1145   MCH 30.5 05/15/2022 0926   MCHC 32.3 05/15/2022 0926   RDW 15.3 05/15/2022 0926   RDW 14.0 09/30/2017 1145   LYMPHSABS 1.8 05/15/2022 0926   LYMPHSABS 1.3 09/30/2017 1145   MONOABS 0.5 05/15/2022 0926   MONOABS 0.2 09/30/2017 1145   EOSABS 0.1 05/15/2022 0926   EOSABS 0.0 09/30/2017 1145   EOSABS 0.1 06/03/2014 1003   BASOSABS 0.0 05/15/2022 0926   BASOSABS 0.0 09/30/2017 1145     .    Latest Ref Rng & Units 05/15/2022    9:26 AM 05/02/2022    9:05 AM 04/18/2022    8:38 AM  CMP  Glucose 70 - 99 mg/dL 85  88  72   BUN 8 - 23 mg/dL 13  11  17   Creatinine 0.44 - 1.00 mg/dL 0.78  0.72  0.80   Sodium 135 - 145 mmol/L 141  139  139   Potassium 3.5 - 5.1 mmol/L 4.1  3.8  3.8   Chloride 98 - 111 mmol/L 112  110  111   CO2 22 - 32 mmol/L 23  24  24   Calcium 8.9 - 10.3 mg/dL 8.5  9.1  8.7   Total Protein 6.5 - 8.1 g/dL 6.3  6.3  6.4   Total Bilirubin 0.3 - 1.2 mg/dL 0.6  0.7  0.7   Alkaline Phos 38 - 126 U/L 60  59  56   AST 15 - 41 U/L 21  27  21   ALT 0 - 44 U/L 20  25  20    03/11/19 BM Bx:   03/11/19  Cytogenetics:            RADIOGRAPHIC STUDIES: I have personally reviewed the radiological images as listed and agreed with the findings in the report. No results found.   Bone survey 11/08/2016 IMPRESSION: 1. Small lytic lesion in the right scapula. 2. Possible small lytic lesion in the left scapula. 3. Postoperative changes. 4. Significant degenerative changes in both hips, left greater than right. 5. No evidence for acute fracture     Electronically Signed   By: Elizabeth  Brown M.D.   On: 11/08/2016 16:19   ASSESSMENT & PLAN:   70 y.o. very pleasant lady with history of   1) R-ISS 3 light chain multiple myeloma (concern for small lytic lesions in left and right scapulae) - (appears light chain producing) and Progressive anemia This was apparently diagnosed as smoldering myeloma in 2011 by her hematologist in [place]. No renal failure/hypercalcemia. Bone survey with concern for possible small lytic lesions in B/L scapulae. PET/CT scan on 03/12/2017 showed no concerning bone lesions. Initial Bm Bx with 17% kappa restricted plasma cells consistent with plasma cell neoplasm. No treatment so far. 03/11/19 BM Bx revealed involvement by 50% plasma cells; genetics -high risk t(14;16), previous genetics from April 2018 BM Bx were standard risk 03/16/19 MRI Lumbar reveals several explanations for her lower back pain including L2-L3 stenosis, but reassuringly, there was not evidence of involvement by multiple myeloma 04/01/19 PET/CT which revealed no FDG evidence of active multiple myeloma within the skeleton. No evidence   of lytic lesions within the skeleton or soft tissue Plasmacytoma. 05/07/2019 DXA scan revealed osteopenia, T-score of -1.2.  2) Chronic leukopenia - ? Related to SMM vs other nutritional deficiencies (h/o gastric bypass surgery put her at risk for nutritional deficiencies).  WBC counts of have been close to normal recently today at 3.8k with an [place] of  1600. B12 levels WNL Ferritin adequate ?additional factor -recent NSAID use. ?element of benign ethnic neutropenia. Has not had any issues with frequent infection.   3) Neuropathy/radiculopathy -Continue follow up with orthopedics  4) history of superficial Venous Thrombosis of the left small saphenous vein  06/24/2019 US venous left : Right: There is no evidence of deep vein thrombosis in the lower extremity. No cystic structure found in the popliteal fossa. Left: Findings consistent with acute superficial vein thrombosis involving the left small saphenous vein. There is no evidence of deep vein thrombosis in the lower extremity.   PLAN: Patient notes no acute new clinical concerns suggestive of myeloma progression at this time Labs done today show stable chronic anemia with a hemoglobin of 9.7 WBC count of 3.9k and platelets of 210k No acute new toxicities from her maintenance carfilzomib at this time. She does note grade 2 fatigue and issues with chronic joint pain and swelling and would like to travel and prefers to take break from her carfilzomib infusions for at least a couple of months.  We discussed that this would be okay and we will monitor her closely Patient currently on a treatment break from her carfilzomib for the next 2 months.  FOLLOW UP:  Please cancel all currently scheduled oncology appointments after carfilzomib infusion today. Labs in 4 weeks Phone visit with Dr. Irene Limbo in 5 weeks Labs in 9 weeks Return to clinic with Dr. Irene Limbo in 10 weeks     The total time spent in the appointment was 25 minutes*.  All of the patient's questions were answered with apparent satisfaction. The patient knows to call the clinic with any problems, questions or concerns.   Sullivan Lone MD MS AAHIVMS [facility] Hematology/Oncology Physician [facility]  .*Total Encounter Time as defined by the Centers for Medicare and Medicaid Services includes, in addition to the  face-to-face time of a patient visit (documented in the note above) non-face-to-face time: obtaining and reviewing outside history, ordering and reviewing medications, tests or procedures, care coordination (communications with other health care professionals or caregivers) and documentation in the medical record.

## 2022-05-22 NOTE — Telephone Encounter (Signed)
error 

## 2022-05-30 ENCOUNTER — Inpatient Hospital Stay: Payer: Medicare Other

## 2022-06-04 DIAGNOSIS — B372 Candidiasis of skin and nail: Secondary | ICD-10-CM | POA: Diagnosis not present

## 2022-06-11 ENCOUNTER — Other Ambulatory Visit: Payer: Self-pay

## 2022-06-11 DIAGNOSIS — C9000 Multiple myeloma not having achieved remission: Secondary | ICD-10-CM

## 2022-06-12 ENCOUNTER — Inpatient Hospital Stay: Payer: Medicare Other

## 2022-06-12 ENCOUNTER — Other Ambulatory Visit: Payer: Self-pay

## 2022-06-12 DIAGNOSIS — Z95828 Presence of other vascular implants and grafts: Secondary | ICD-10-CM

## 2022-06-12 DIAGNOSIS — D72819 Decreased white blood cell count, unspecified: Secondary | ICD-10-CM | POA: Diagnosis not present

## 2022-06-12 DIAGNOSIS — M1990 Unspecified osteoarthritis, unspecified site: Secondary | ICD-10-CM | POA: Diagnosis not present

## 2022-06-12 DIAGNOSIS — D539 Nutritional anemia, unspecified: Secondary | ICD-10-CM | POA: Diagnosis not present

## 2022-06-12 DIAGNOSIS — C9000 Multiple myeloma not having achieved remission: Secondary | ICD-10-CM | POA: Diagnosis not present

## 2022-06-12 DIAGNOSIS — Z5112 Encounter for antineoplastic immunotherapy: Secondary | ICD-10-CM | POA: Diagnosis not present

## 2022-06-12 DIAGNOSIS — G629 Polyneuropathy, unspecified: Secondary | ICD-10-CM | POA: Diagnosis not present

## 2022-06-12 DIAGNOSIS — D649 Anemia, unspecified: Secondary | ICD-10-CM

## 2022-06-12 LAB — CBC WITH DIFFERENTIAL (CANCER CENTER ONLY)
Abs Immature Granulocytes: 0.01 10*3/uL (ref 0.00–0.07)
Basophils Absolute: 0 10*3/uL (ref 0.0–0.1)
Basophils Relative: 1 %
Eosinophils Absolute: 0 10*3/uL (ref 0.0–0.5)
Eosinophils Relative: 1 %
HCT: 29.9 % — ABNORMAL LOW (ref 36.0–46.0)
Hemoglobin: 9.7 g/dL — ABNORMAL LOW (ref 12.0–15.0)
Immature Granulocytes: 0 %
Lymphocytes Relative: 51 %
Lymphs Abs: 1.7 10*3/uL (ref 0.7–4.0)
MCH: 29.3 pg (ref 26.0–34.0)
MCHC: 32.4 g/dL (ref 30.0–36.0)
MCV: 90.3 fL (ref 80.0–100.0)
Monocytes Absolute: 0.4 10*3/uL (ref 0.1–1.0)
Monocytes Relative: 12 %
Neutro Abs: 1.2 10*3/uL — ABNORMAL LOW (ref 1.7–7.7)
Neutrophils Relative %: 35 %
Platelet Count: 168 10*3/uL (ref 150–400)
RBC: 3.31 MIL/uL — ABNORMAL LOW (ref 3.87–5.11)
RDW: 14.8 % (ref 11.5–15.5)
WBC Count: 3.3 10*3/uL — ABNORMAL LOW (ref 4.0–10.5)
nRBC: 0 % (ref 0.0–0.2)

## 2022-06-12 LAB — CMP (CANCER CENTER ONLY)
ALT: 26 U/L (ref 0–44)
AST: 27 U/L (ref 15–41)
Albumin: 4.1 g/dL (ref 3.5–5.0)
Alkaline Phosphatase: 55 U/L (ref 38–126)
Anion gap: 5 (ref 5–15)
BUN: 13 mg/dL (ref 8–23)
CO2: 24 mmol/L (ref 22–32)
Calcium: 9 mg/dL (ref 8.9–10.3)
Chloride: 109 mmol/L (ref 98–111)
Creatinine: 0.66 mg/dL (ref 0.44–1.00)
GFR, Estimated: >60 mL/min (ref >60)
Glucose, Bld: 94 mg/dL (ref 70–99)
Potassium: 3.9 mmol/L (ref 3.5–5.1)
Sodium: 138 mmol/L (ref 135–145)
Total Bilirubin: 0.9 mg/dL (ref 0.3–1.2)
Total Protein: 6.3 g/dL — ABNORMAL LOW (ref 6.5–8.1)

## 2022-06-12 LAB — IRON AND IRON BINDING CAPACITY (CC-WL,HP ONLY)
Iron: 35 ug/dL (ref 28–170)
Saturation Ratios: 7 % — ABNORMAL LOW (ref 10.4–31.8)
TIBC: 484 ug/dL — ABNORMAL HIGH (ref 250–450)
UIBC: 449 ug/dL — ABNORMAL HIGH (ref 148–442)

## 2022-06-12 MED ORDER — SODIUM CHLORIDE 0.9% FLUSH
10.0000 mL | Freq: Once | INTRAVENOUS | Status: AC
  Administered 2022-06-12: 10 mL

## 2022-06-12 MED ORDER — HEPARIN SOD (PORK) LOCK FLUSH 100 UNIT/ML IV SOLN
500.0000 [IU] | Freq: Once | INTRAVENOUS | Status: AC
  Administered 2022-06-12: 500 [IU]

## 2022-06-13 ENCOUNTER — Inpatient Hospital Stay: Payer: Medicare Other

## 2022-06-13 LAB — KAPPA/LAMBDA LIGHT CHAINS
Kappa free light chain: 12.6 mg/L (ref 3.3–19.4)
Kappa, lambda light chain ratio: 0.98 (ref 0.26–1.65)
Lambda free light chains: 12.9 mg/L (ref 5.7–26.3)

## 2022-06-19 ENCOUNTER — Inpatient Hospital Stay: Payer: Medicare Other | Attending: Hematology | Admitting: Hematology

## 2022-06-19 DIAGNOSIS — C9001 Multiple myeloma in remission: Secondary | ICD-10-CM

## 2022-06-19 DIAGNOSIS — D72819 Decreased white blood cell count, unspecified: Secondary | ICD-10-CM | POA: Insufficient documentation

## 2022-06-19 DIAGNOSIS — D649 Anemia, unspecified: Secondary | ICD-10-CM | POA: Diagnosis not present

## 2022-06-19 DIAGNOSIS — D509 Iron deficiency anemia, unspecified: Secondary | ICD-10-CM | POA: Diagnosis not present

## 2022-06-19 DIAGNOSIS — M8580 Other specified disorders of bone density and structure, unspecified site: Secondary | ICD-10-CM | POA: Insufficient documentation

## 2022-06-19 DIAGNOSIS — Z86718 Personal history of other venous thrombosis and embolism: Secondary | ICD-10-CM | POA: Insufficient documentation

## 2022-06-19 DIAGNOSIS — G629 Polyneuropathy, unspecified: Secondary | ICD-10-CM | POA: Insufficient documentation

## 2022-06-19 DIAGNOSIS — C9000 Multiple myeloma not having achieved remission: Secondary | ICD-10-CM | POA: Insufficient documentation

## 2022-06-19 LAB — MULTIPLE MYELOMA PANEL, SERUM
Albumin SerPl Elph-Mcnc: 3.9 g/dL (ref 2.9–4.4)
Albumin/Glob SerPl: 1.8 — ABNORMAL HIGH (ref 0.7–1.7)
Alpha 1: 0.1 g/dL (ref 0.0–0.4)
Alpha2 Glob SerPl Elph-Mcnc: 0.6 g/dL (ref 0.4–1.0)
B-Globulin SerPl Elph-Mcnc: 0.9 g/dL (ref 0.7–1.3)
Gamma Glob SerPl Elph-Mcnc: 0.6 g/dL (ref 0.4–1.8)
Globulin, Total: 2.2 g/dL (ref 2.2–3.9)
IgA: 53 mg/dL — ABNORMAL LOW (ref 87–352)
IgG (Immunoglobin G), Serum: 731 mg/dL (ref 586–1602)
IgM (Immunoglobulin M), Srm: 16 mg/dL — ABNORMAL LOW (ref 26–217)
Total Protein ELP: 6.1 g/dL (ref 6.0–8.5)

## 2022-06-19 NOTE — Progress Notes (Signed)
HEMATOLOGY/ONCOLOGY PHONE VISIT NOTE  Date of Service: 06/19/2022   PCP: Marzetta Board MD  CHIEF COMPLAINTS/PURPOSE OF CONSULTATION:  Follow-up for continued evaluation and management of multiple myeloma  HISTORY OF PRESENTING ILLNESS:  See previous note for details on initial presentation  INTERVAL HISTORY:  I connected with Madison Parker on 06/19/2022 at 8:40 AM by telephone visit and verified that I am speaking with the correct person using two identifiers.   I discussed the limitations, risks, security and privacy concerns of performing an evaluation and management service by telemedicine and the availability of in-person appointments. I also discussed with the patient that there may be a patient responsible charge related to this service. The patient expressed understanding and agreed to proceed.   Other persons participating in the visit and their role in the encounter: None  Patient's location: Home Provider's location: [place]  Madison Parker is a 71 y.o. female who was contacted via phone for continued evaluation and management of multiple myeloma. She reports She is doing well with no new symptoms or concerns.  We also discussed setting her up for stool occult blood testing to evaluate for possible GI blood loss which she is agreeable to.  We discussed starting IV iron in the context of low iron stores which she is agreeable to.  She notes her grade 1 through 2 fatigue has improved since being off the Carfilzomib. She notes that she wishes to continue to hold her Carfilzomib at this time.  No new infection issues. No other new or acute focal symptoms.  Labs done 06/12/2022 were reviewed in detail.  MEDICAL HISTORY:  Past Medical History:  Diagnosis Date   Headache    Low back pain    SBO (small bowel obstruction) ([place]) 2010   Smoldering multiple myeloma   Previous history of hypothyroidism- not currently medications Anxiety Obesity .There is  no height or weight on file to calculate BMI. 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 Lumbosacral radiculopathy Left hip pain  SURGICAL HISTORY: Past Surgical History:  Procedure Laterality Date   ABDOMINAL HYSTERECTOMY     complete   BACK SURGERY     lower at [facility]   COLONOSCOPY WITH PROPOFOL N/A 08/23/2020   Procedure: COLONOSCOPY WITH PROPOFOL;  Surgeon: Harvel Quale, MD;  [facility];  Service: Gastroenterology;  Laterality: N/A;  900   colonscopy  2014   GASTRIC BYPASS  yrs ago   HERNIA REPAIR     IR IMAGING GUIDED PORT INSERTION  04/20/2019   KNEE SURGERY  06/04/2009   both knees replaced    POLYPECTOMY  08/23/2020   Procedure: POLYPECTOMY;  Surgeon: Harvel Quale, MD;  [facility];  Service: Gastroenterology;;   TONSILLECTOMY  age 12   TOTAL HIP ARTHROPLASTY Left 08/16/2017   Procedure: LEFT TOTAL HIP ARTHROPLASTY ANTERIOR APPROACH;  Surgeon: Dorna Leitz, MD;  Location: [facility];  Service: Orthopedics;  Laterality: Left;  Spinal surgery April 2017  SOCIAL HISTORY: Social History   Socioeconomic History   Marital status: Married    Spouse name: Not on file   Number of children: Not on file   Years of education: Not on file   Highest education level: Not on file  Occupational History   Not on file  Tobacco Use   Smoking status: Former    Packs/day: 0.50    Years: 10.00    Total pack years: 5.00  Types: Cigarettes   Smokeless tobacco: Never   Tobacco comments:    quit 1977  Vaping Use   Vaping Use: Never used  Substance and Sexual Activity   Alcohol use: Yes    Comment: occ   Drug use: No   Sexual activity: Not on file  Other Topics Concern   Not on file  Social History Narrative   Not on file   Social Determinants of Health   Financial Resource Strain: Not on file  Food Insecurity: Not on file  Transportation Needs: Not on file  Physical  Activity: Not on file  Stress: Not on file  Social Connections: Not on file  Intimate Partner Violence: Not At Risk (03/22/2021)   Humiliation, Afraid, Rape, and Kick questionnaire    Fear of Current or Ex-Partner: No    Emotionally Abused: No    Physically Abused: No    Sexually Abused: No  Occasional alcohol use Former smoker and smoked 1 pack per week for about 9 years has since quit.  FAMILY HISTORY: Family History  Problem Relation Age of Onset   Breast cancer Neg Hx     ALLERGIES:  is allergic to codeine.  MEDICATIONS:  Current Outpatient Medications  Medication Sig Dispense Refill   acyclovir (ZOVIRAX) 800 MG tablet Take 0.5 tablets (400 mg total) by mouth 2 (two) times daily. 60 tablet 11   Calcium Carbonate-Vitamin D 600-200 MG-UNIT TABS Calcium 600 with Vitamin D3 600 mg-5 mcg (200 unit) tablet  Take 1 tablet every day by oral route as directed.     Cholecalciferol 1.25 MG (50000 UT) TABS Take by mouth.     fluconazole (DIFLUCAN) 100 MG tablet Take 1 tablet (100 mg total) by mouth daily. 10 tablet 0   FOLIC ACID PO Take 2,993 mg by mouth daily.      lidocaine-prilocaine (EMLA) cream Apply 1 application. topically as needed. 30 g 2   NYSTATIN powder Apply topically 2 (two) times daily. (Patient not taking: Reported on 12/27/2021) 15 g 1   Probiotic Product (PROBIOTIC DAILY PO) Take 2 capsules by mouth daily. Gummies     vitamin B-12 (CYANOCOBALAMIN) 1000 MCG tablet Take 1,000 mcg by mouth daily.      Vitamin D, Ergocalciferol, (DRISDOL) 1.25 MG (50000 UNIT) CAPS capsule TAKE 1 CAPSULE ONCE A WEEK (Patient taking differently: Take 50,000 Units by mouth every 7 (seven) days. Sunday) 13 capsule 3   No current facility-administered medications for this visit.   Facility-Administered Medications Ordered in Other Visits  Medication Dose Route Frequency Provider Last Rate Last Admin   sodium chloride flush (NS) 0.9 % injection 10 mL  10 mL Intracatheter PRN Brunetta Genera,  MD   10 mL at 05/11/19 1437    REVIEW OF SYSTEMS:  10 Point review of Systems was done is negative except as noted above.  PHYSICAL EXAMINATION: Telemedicine appointment  LABORATORY DATA:  I have reviewed the data as listed  .    Latest Ref Rng & Units 06/12/2022    8:52 AM 05/15/2022    9:26 AM 05/02/2022    9:05 AM  CBC  WBC 4.0 - 10.5 K/uL 3.3  3.9  4.5   Hemoglobin 12.0 - 15.0 g/dL 9.7  9.7  9.8   Hematocrit 36.0 - 46.0 % 29.9  30.0  29.0   Platelets 150 - 400 K/uL 168  210  231    CBC    Component Value Date/Time   WBC 3.3 (L) 06/12/2022 7169  WBC 3.3 (L) 10/04/2021 0902   RBC 3.31 (L) 06/12/2022 0852   HGB 9.7 (L) 06/12/2022 0852   HGB 11.3 (L) 09/30/2017 1145   HCT 29.9 (L) 06/12/2022 0852   HCT 35.1 09/30/2017 1145   PLT 168 06/12/2022 0852   PLT 172 09/30/2017 1145   MCV 90.3 06/12/2022 0852   MCV 97.5 09/30/2017 1145   MCH 29.3 06/12/2022 0852   MCHC 32.4 06/12/2022 0852   RDW 14.8 06/12/2022 0852   RDW 14.0 09/30/2017 1145   LYMPHSABS 1.7 06/12/2022 0852   LYMPHSABS 1.3 09/30/2017 1145   MONOABS 0.4 06/12/2022 0852   MONOABS 0.2 09/30/2017 1145   EOSABS 0.0 06/12/2022 0852   EOSABS 0.0 09/30/2017 1145   EOSABS 0.1 06/03/2014 1003   BASOSABS 0.0 06/12/2022 0852   BASOSABS 0.0 09/30/2017 1145     .    Latest Ref Rng & Units 06/12/2022    8:52 AM 05/15/2022    9:26 AM 05/02/2022    9:05 AM  CMP  Glucose 70 - 99 mg/dL 94  85  88   BUN 8 - 23 mg/dL 13  13  11    Creatinine 0.44 - 1.00 mg/dL 0.66  0.78  0.72   Sodium 135 - 145 mmol/L 138  141  139   Potassium 3.5 - 5.1 mmol/L 3.9  4.1  3.8   Chloride 98 - 111 mmol/L 109  112  110   CO2 22 - 32 mmol/L 24  23  24    Calcium 8.9 - 10.3 mg/dL 9.0  8.5  9.1   Total Protein 6.5 - 8.1 g/dL 6.3  6.3  6.3   Total Bilirubin 0.3 - 1.2 mg/dL 0.9  0.6  0.7   Alkaline Phos 38 - 126 U/L 55  60  59   AST 15 - 41 U/L 27  21  27    ALT 0 - 44 U/L 26  20  25     03/11/19 BM Bx:   03/11/19  Cytogenetics:            RADIOGRAPHIC STUDIES: I have personally reviewed the radiological images as listed and agreed with the findings in the report. No results found.   Bone survey 11/08/2016 IMPRESSION: 1. Small lytic lesion in the right scapula. 2. Possible small lytic lesion in the left scapula. 3. Postoperative changes. 4. Significant degenerative changes in both hips, left greater than right. 5. No evidence for acute fracture     Electronically Signed   By: Nolon Nations M.D.   On: 11/08/2016 16:19   ASSESSMENT & PLAN:   71 y.o. very pleasant lady with history of   1) R-ISS 3 light chain multiple myeloma (concern for small lytic lesions in left and right scapulae) - (appears light chain producing) and Progressive anemia This was apparently diagnosed as smoldering myeloma in 2011 by her hematologist in [place]. No renal failure/hypercalcemia. Bone survey with concern for possible small lytic lesions in B/L scapulae. PET/CT scan on 03/12/2017 showed no concerning bone lesions. Initial Bm Bx with 17% kappa restricted plasma cells consistent with plasma cell neoplasm. No treatment so far. 03/11/19 BM Bx revealed involvement by 50% plasma cells; genetics -high risk t(14;16), previous genetics from April 2018 BM Bx were standard risk 03/16/19 MRI Lumbar reveals several explanations for her lower back pain including L2-L3 stenosis, but reassuringly, there was not evidence of involvement by multiple myeloma 04/01/19 PET/CT which revealed no FDG evidence of active multiple myeloma within the skeleton. No evidence  of lytic lesions within the skeleton or soft tissue Plasmacytoma. 05/07/2019 DXA scan revealed osteopenia, T-score of -1.2.  2) Chronic leukopenia - ? Related to SMM vs other nutritional deficiencies (h/o gastric bypass surgery put her at risk for nutritional deficiencies).  WBC counts of have been close to normal recently today at 3.3k with an [place] of  1200. B12 levels WNL Ferritin adequate ?additional factor -recent NSAID use. ?element of benign ethnic neutropenia. Has not had any issues with frequent infection.   3) Neuropathy/radiculopathy -Continue follow up with orthopedics  4) history of superficial Venous Thrombosis of the left small saphenous vein  06/24/2019 US venous left : Right: There is no evidence of deep vein thrombosis in the lower extremity. No cystic structure found in the popliteal fossa. Left: Findings consistent with acute superficial vein thrombosis involving the left small saphenous vein. There is no evidence of deep vein thrombosis in the lower extremity.   PLAN: Patient notes no acute new clinical concerns suggestive of myeloma progression at this time Labs done today show stable chronic anemia with a hemoglobin of 9.7 WBC count of 3.3k and platelets of 168k No acute new toxicities from her maintenance carfilzomib at this time. She does note grade 2 fatigue and issues with chronic joint pain and swelling and would like to travel and prefers to take break from her carfilzomib infusions for at least a couple of months.  We discussed that this would be okay and we will monitor her closely Patient currently on a treatment break from her carfilzomib for the next 2 months. -Schedule stool occult blood testing to evaluate for possible GI blood loss. -She will start IV Venofer 300 mg weekly 3x doses. -She notes her grade 1 through 2 fatigue has improved since being off the Carfilzomib. She notes that she wishes to continue to hold her Carfilzomib at this time.  FOLLOW UP: IV Venofer 300 mg weekly x3 doses Lab appointment with first dose of Venofer for stool occult blood testing Return to clinic with Dr. Irene Limbo with labs in 2 months  The total time spent in the appointment was 20 minutes*.  All of the patient's questions were answered with apparent satisfaction. The patient knows to call the clinic with any problems,  questions or concerns.   Sullivan Lone MD MS AAHIVMS [facility] Hematology/Oncology Physician [facility]  .*Total Encounter Time as defined by the Centers for Medicare and Medicaid Services includes, in addition to the face-to-face time of a patient visit (documented in the note above) non-face-to-face time: obtaining and reviewing outside history, ordering and reviewing medications, tests or procedures, care coordination (communications with other health care professionals or caregivers) and documentation in the medical record.  I, Melene Muller, am acting as scribe for Dr. Sullivan Lone, MD.

## 2022-06-21 ENCOUNTER — Other Ambulatory Visit: Payer: Self-pay | Admitting: *Deleted

## 2022-06-21 ENCOUNTER — Telehealth: Payer: Self-pay | Admitting: Hematology

## 2022-06-21 DIAGNOSIS — C9001 Multiple myeloma in remission: Secondary | ICD-10-CM

## 2022-06-21 NOTE — Telephone Encounter (Signed)
Left message with follow-up appointments per 9/5 los.

## 2022-06-24 ENCOUNTER — Encounter: Payer: Self-pay | Admitting: Hematology

## 2022-06-25 ENCOUNTER — Other Ambulatory Visit: Payer: Self-pay

## 2022-06-25 ENCOUNTER — Inpatient Hospital Stay: Payer: Medicare Other

## 2022-06-25 VITALS — BP 116/85 | HR 72 | Temp 98.5°F | Resp 18

## 2022-06-25 DIAGNOSIS — Z86718 Personal history of other venous thrombosis and embolism: Secondary | ICD-10-CM | POA: Diagnosis not present

## 2022-06-25 DIAGNOSIS — M8580 Other specified disorders of bone density and structure, unspecified site: Secondary | ICD-10-CM | POA: Diagnosis not present

## 2022-06-25 DIAGNOSIS — C9000 Multiple myeloma not having achieved remission: Secondary | ICD-10-CM | POA: Diagnosis not present

## 2022-06-25 DIAGNOSIS — C9001 Multiple myeloma in remission: Secondary | ICD-10-CM

## 2022-06-25 DIAGNOSIS — Z95828 Presence of other vascular implants and grafts: Secondary | ICD-10-CM

## 2022-06-25 DIAGNOSIS — D72819 Decreased white blood cell count, unspecified: Secondary | ICD-10-CM | POA: Diagnosis not present

## 2022-06-25 DIAGNOSIS — D649 Anemia, unspecified: Secondary | ICD-10-CM | POA: Diagnosis not present

## 2022-06-25 DIAGNOSIS — G629 Polyneuropathy, unspecified: Secondary | ICD-10-CM | POA: Diagnosis not present

## 2022-06-25 LAB — CBC WITH DIFFERENTIAL (CANCER CENTER ONLY)
Abs Immature Granulocytes: 0 10*3/uL (ref 0.00–0.07)
Basophils Absolute: 0 10*3/uL (ref 0.0–0.1)
Basophils Relative: 1 %
Eosinophils Absolute: 0 10*3/uL (ref 0.0–0.5)
Eosinophils Relative: 1 %
HCT: 32.7 % — ABNORMAL LOW (ref 36.0–46.0)
Hemoglobin: 10.2 g/dL — ABNORMAL LOW (ref 12.0–15.0)
Immature Granulocytes: 0 %
Lymphocytes Relative: 42 %
Lymphs Abs: 1.6 10*3/uL (ref 0.7–4.0)
MCH: 29 pg (ref 26.0–34.0)
MCHC: 31.2 g/dL (ref 30.0–36.0)
MCV: 92.9 fL (ref 80.0–100.0)
Monocytes Absolute: 0.4 10*3/uL (ref 0.1–1.0)
Monocytes Relative: 11 %
Neutro Abs: 1.7 10*3/uL (ref 1.7–7.7)
Neutrophils Relative %: 45 %
Platelet Count: 185 10*3/uL (ref 150–400)
RBC: 3.52 MIL/uL — ABNORMAL LOW (ref 3.87–5.11)
RDW: 14.7 % (ref 11.5–15.5)
WBC Count: 3.8 10*3/uL — ABNORMAL LOW (ref 4.0–10.5)
nRBC: 0 % (ref 0.0–0.2)

## 2022-06-25 LAB — CMP (CANCER CENTER ONLY)
ALT: 26 U/L (ref 0–44)
AST: 27 U/L (ref 15–41)
Albumin: 4 g/dL (ref 3.5–5.0)
Alkaline Phosphatase: 52 U/L (ref 38–126)
Anion gap: 5 (ref 5–15)
BUN: 13 mg/dL (ref 8–23)
CO2: 25 mmol/L (ref 22–32)
Calcium: 9.1 mg/dL (ref 8.9–10.3)
Chloride: 108 mmol/L (ref 98–111)
Creatinine: 0.87 mg/dL (ref 0.44–1.00)
GFR, Estimated: >60 mL/min (ref >60)
Glucose, Bld: 91 mg/dL (ref 70–99)
Potassium: 3.9 mmol/L (ref 3.5–5.1)
Sodium: 138 mmol/L (ref 135–145)
Total Bilirubin: 0.6 mg/dL (ref 0.3–1.2)
Total Protein: 6.5 g/dL (ref 6.5–8.1)

## 2022-06-25 MED ORDER — SODIUM CHLORIDE 0.9% FLUSH
10.0000 mL | Freq: Once | INTRAVENOUS | Status: AC
  Administered 2022-06-25: 10 mL

## 2022-06-25 MED ORDER — SODIUM CHLORIDE 0.9 % IV SOLN: Freq: Once | INTRAVENOUS | Status: AC

## 2022-06-25 MED ORDER — ALTEPLASE 2 MG IJ SOLR
2.0000 mg | Freq: Once | INTRAMUSCULAR | Status: AC
  Administered 2022-06-25: 2 mg
  Filled 2022-06-25: qty 2

## 2022-06-25 MED ORDER — ACETAMINOPHEN 325 MG PO TABS
650.0000 mg | ORAL_TABLET | Freq: Once | ORAL | Status: AC
  Administered 2022-06-25: 650 mg via ORAL
  Filled 2022-06-25: qty 2

## 2022-06-25 MED ORDER — HEPARIN SOD (PORK) LOCK FLUSH 100 UNIT/ML IV SOLN
500.0000 [IU] | Freq: Once | INTRAVENOUS | Status: AC | PRN
  Administered 2022-06-25: 500 [IU]

## 2022-06-25 MED ORDER — SODIUM CHLORIDE 0.9 % IV SOLN
300.0000 mg | Freq: Once | INTRAVENOUS | Status: AC
  Administered 2022-06-25: 300 mg via INTRAVENOUS
  Filled 2022-06-25: qty 300

## 2022-06-25 MED ORDER — SODIUM CHLORIDE 0.9 % IV SOLN: Freq: Once | INTRAVENOUS | Status: DC

## 2022-06-25 MED ORDER — LORATADINE 10 MG PO TABS
10.0000 mg | ORAL_TABLET | Freq: Once | ORAL | Status: AC
  Administered 2022-06-25: 10 mg via ORAL
  Filled 2022-06-25: qty 1

## 2022-06-25 MED ORDER — SODIUM CHLORIDE 0.9% FLUSH
10.0000 mL | Freq: Once | INTRAVENOUS | Status: AC | PRN
  Administered 2022-06-25: 10 mL

## 2022-06-25 NOTE — Patient Instructions (Signed)

## 2022-06-27 ENCOUNTER — Ambulatory Visit: Payer: TRICARE For Life (TFL)

## 2022-06-28 ENCOUNTER — Other Ambulatory Visit: Payer: Self-pay

## 2022-06-28 DIAGNOSIS — C9001 Multiple myeloma in remission: Secondary | ICD-10-CM

## 2022-07-02 ENCOUNTER — Inpatient Hospital Stay: Payer: Medicare Other

## 2022-07-02 ENCOUNTER — Other Ambulatory Visit: Payer: Self-pay

## 2022-07-02 VITALS — BP 125/75 | HR 65 | Temp 98.0°F | Resp 16

## 2022-07-02 DIAGNOSIS — D649 Anemia, unspecified: Secondary | ICD-10-CM | POA: Diagnosis not present

## 2022-07-02 DIAGNOSIS — G629 Polyneuropathy, unspecified: Secondary | ICD-10-CM | POA: Diagnosis not present

## 2022-07-02 DIAGNOSIS — Z86718 Personal history of other venous thrombosis and embolism: Secondary | ICD-10-CM | POA: Diagnosis not present

## 2022-07-02 DIAGNOSIS — D72819 Decreased white blood cell count, unspecified: Secondary | ICD-10-CM | POA: Diagnosis not present

## 2022-07-02 DIAGNOSIS — C9000 Multiple myeloma not having achieved remission: Secondary | ICD-10-CM | POA: Diagnosis not present

## 2022-07-02 DIAGNOSIS — Z95828 Presence of other vascular implants and grafts: Secondary | ICD-10-CM

## 2022-07-02 DIAGNOSIS — M8580 Other specified disorders of bone density and structure, unspecified site: Secondary | ICD-10-CM | POA: Diagnosis not present

## 2022-07-02 MED ORDER — SODIUM CHLORIDE 0.9 % IV SOLN: Freq: Once | INTRAVENOUS | Status: AC

## 2022-07-02 MED ORDER — LORATADINE 10 MG PO TABS
10.0000 mg | ORAL_TABLET | Freq: Once | ORAL | Status: AC
  Administered 2022-07-02: 10 mg via ORAL
  Filled 2022-07-02: qty 1

## 2022-07-02 MED ORDER — ZOLEDRONIC ACID 4 MG/100ML IV SOLN
4.0000 mg | Freq: Once | INTRAVENOUS | Status: AC
  Administered 2022-07-02: 4 mg via INTRAVENOUS
  Filled 2022-07-02: qty 100

## 2022-07-02 MED ORDER — ACETAMINOPHEN 325 MG PO TABS
650.0000 mg | ORAL_TABLET | Freq: Once | ORAL | Status: AC
  Administered 2022-07-02: 650 mg via ORAL
  Filled 2022-07-02: qty 2

## 2022-07-02 MED ORDER — SODIUM CHLORIDE 0.9 % IV SOLN
300.0000 mg | Freq: Once | INTRAVENOUS | Status: AC
  Administered 2022-07-02: 300 mg via INTRAVENOUS
  Filled 2022-07-02: qty 300

## 2022-07-02 MED ORDER — HEPARIN SOD (PORK) LOCK FLUSH 100 UNIT/ML IV SOLN
500.0000 [IU] | Freq: Once | INTRAVENOUS | Status: AC | PRN
  Administered 2022-07-02: 500 [IU]

## 2022-07-02 MED ORDER — SODIUM CHLORIDE 0.9% FLUSH
10.0000 mL | Freq: Once | INTRAVENOUS | Status: AC | PRN
  Administered 2022-07-02: 10 mL

## 2022-07-02 NOTE — Progress Notes (Signed)
Pt declined to be observed for 30 minutes post Venofer infusion. Pt tolerated trtmt well w/out incident. VSS at discharge.  Ambulatory to lobby in stable condition.Marland Kitchen

## 2022-07-02 NOTE — Patient Instructions (Signed)
Zoledronic Acid Injection (Cancer) What is this medication? ZOLEDRONIC ACID (ZOE le dron ik AS id) treats high calcium levels in the blood caused by cancer. It may also be used with chemotherapy to treat weakened bones caused by cancer. It works by slowing down the release of calcium from bones. This lowers calcium levels in your blood. It also makes your bones stronger and less likely to break (fracture). It belongs to a group of medications called bisphosphonates. This medicine may be used for other purposes; ask your health care provider or pharmacist if you have questions. COMMON BRAND NAME(S): Zometa, Zometa Powder What should I tell my care team before I take this medication? They need to know if you have any of these conditions: Dehydration Dental disease Kidney disease Liver disease Low levels of calcium in the blood Lung or breathing disease, such as asthma Receiving steroids, such as dexamethasone or prednisone An unusual or allergic reaction to zoledronic acid, other medications, foods, dyes, or preservatives Pregnant or trying to get pregnant Breast-feeding How should I use this medication? This medication is injected into a vein. It is given by your care team in a hospital or clinic setting. Talk to your care team about the use of this medication in children. Special care may be needed. Overdosage: If you think you have taken too much of this medicine contact a poison control center or emergency room at once. NOTE: This medicine is only for you. Do not share this medicine with others. What if I miss a dose? Keep appointments for follow-up doses. It is important not to miss your dose. Call your care team if you are unable to keep an appointment. What may interact with this medication? Certain antibiotics given by injection Diuretics, such as bumetanide, furosemide NSAIDs, medications for pain and inflammation, such as ibuprofen or naproxen Teriparatide Thalidomide This list  may not describe all possible interactions. Give your health care provider a list of all the medicines, herbs, non-prescription drugs, or dietary supplements you use. Also tell them if you smoke, drink alcohol, or use illegal drugs. Some items may interact with your medicine. What should I watch for while using this medication? Visit your care team for regular checks on your progress. It may be some time before you see the benefit from this medication. Some people who take this medication have severe bone, joint, or muscle pain. This medication may also increase your risk for jaw problems or a broken thigh bone. Tell your care team right away if you have severe pain in your jaw, bones, joints, or muscles. Tell you care team if you have any pain that does not go away or that gets worse. Tell your dentist and dental surgeon that you are taking this medication. You should not have major dental surgery while on this medication. See your dentist to have a dental exam and fix any dental problems before starting this medication. Take good care of your teeth while on this medication. Make sure you see your dentist for regular follow-up appointments. You should make sure you get enough calcium and vitamin D while you are taking this medication. Discuss the foods you eat and the vitamins you take with your care team. Check with your care team if you have severe diarrhea, nausea, and vomiting, or if you sweat a lot. The loss of too much body fluid may make it dangerous for you to take this medication. You may need bloodwork while taking this medication. Talk to your care team if   you wish to become pregnant or think you might be pregnant. This medication can cause serious birth defects. What side effects may I notice from receiving this medication? Side effects that you should report to your care team as soon as possible: Allergic reactions--skin rash, itching, hives, swelling of the face, lips, tongue, or  throat Kidney injury--decrease in the amount of urine, swelling of the ankles, hands, or feet Low calcium level--muscle pain or cramps, confusion, tingling, or numbness in the hands or feet Osteonecrosis of the jaw--pain, swelling, or redness in the mouth, numbness of the jaw, poor healing after dental work, unusual discharge from the mouth, visible bones in the mouth Severe bone, joint, or muscle pain Side effects that usually do not require medical attention (report to your care team if they continue or are bothersome): Constipation Fatigue Fever Loss of appetite Nausea Stomach pain This list may not describe all possible side effects. Call your doctor for medical advice about side effects. You may report side effects to FDA at 1-800-FDA-1088. Where should I keep my medication? This medication is given in a hospital or clinic. It will not be stored at home. NOTE: This sheet is a summary. It may not cover all possible information. If you have questions about this medicine, talk to your doctor, pharmacist, or health care provider.  2023 Elsevier/Gold Standard (2021-11-16 00:00:00)  Iron Sucrose Injection What is this medication? IRON SUCROSE (EYE ern SOO krose) treats low levels of iron (iron deficiency anemia) in people with kidney disease. Iron is a mineral that plays an important role in making red blood cells, which carry oxygen from your lungs to the rest of your body. This medicine may be used for other purposes; ask your health care provider or pharmacist if you have questions. COMMON BRAND NAME(S): Venofer What should I tell my care team before I take this medication? They need to know if you have any of these conditions: Anemia not caused by low iron levels Heart disease High levels of iron in the blood Kidney disease Liver disease An unusual or allergic reaction to iron, other medications, foods, dyes, or preservatives Pregnant or trying to get pregnant Breast-feeding How  should I use this medication? This medication is for infusion into a vein. It is given in a hospital or clinic setting. Talk to your care team about the use of this medication in children. While this medication may be prescribed for children as young as 2 years for selected conditions, precautions do apply. Overdosage: If you think you have taken too much of this medicine contact a poison control center or emergency room at once. NOTE: This medicine is only for you. Do not share this medicine with others. What if I miss a dose? It is important not to miss your dose. Call your care team if you are unable to keep an appointment. What may interact with this medication? Do not take this medication with any of the following: Deferoxamine Dimercaprol Other iron products This medication may also interact with the following: Chloramphenicol Deferasirox This list may not describe all possible interactions. Give your health care provider a list of all the medicines, herbs, non-prescription drugs, or dietary supplements you use. Also tell them if you smoke, drink alcohol, or use illegal drugs. Some items may interact with your medicine. What should I watch for while using this medication? Visit your care team regularly. Tell your care team if your symptoms do not start to get better or if they get worse. You  may need blood work done while you are taking this medication. You may need to follow a special diet. Talk to your care team. Foods that contain iron include: whole grains/cereals, dried fruits, beans, or peas, leafy green vegetables, and organ meats (liver, kidney). What side effects may I notice from receiving this medication? Side effects that you should report to your care team as soon as possible: Allergic reactions--skin rash, itching, hives, swelling of the face, lips, tongue, or throat Low blood pressure--dizziness, feeling faint or lightheaded, blurry vision Shortness of breath Side effects  that usually do not require medical attention (report to your care team if they continue or are bothersome): Flushing Headache Joint pain Muscle pain Nausea Pain, redness, or irritation at injection site This list may not describe all possible side effects. Call your doctor for medical advice about side effects. You may report side effects to FDA at 1-800-FDA-1088. Where should I keep my medication? This medication is given in a hospital or clinic and will not be stored at home. NOTE: This sheet is a summary. It may not cover all possible information. If you have questions about this medicine, talk to your doctor, pharmacist, or health care provider.  2023 Elsevier/Gold Standard (2007-11-22 00:00:00)

## 2022-07-09 ENCOUNTER — Inpatient Hospital Stay: Payer: Medicare Other

## 2022-07-09 ENCOUNTER — Other Ambulatory Visit: Payer: Self-pay

## 2022-07-09 VITALS — BP 142/89 | HR 71 | Temp 98.0°F | Resp 17

## 2022-07-09 DIAGNOSIS — G629 Polyneuropathy, unspecified: Secondary | ICD-10-CM | POA: Diagnosis not present

## 2022-07-09 DIAGNOSIS — C9000 Multiple myeloma not having achieved remission: Secondary | ICD-10-CM

## 2022-07-09 DIAGNOSIS — D649 Anemia, unspecified: Secondary | ICD-10-CM | POA: Diagnosis not present

## 2022-07-09 DIAGNOSIS — D72819 Decreased white blood cell count, unspecified: Secondary | ICD-10-CM | POA: Diagnosis not present

## 2022-07-09 DIAGNOSIS — M8580 Other specified disorders of bone density and structure, unspecified site: Secondary | ICD-10-CM | POA: Diagnosis not present

## 2022-07-09 DIAGNOSIS — Z95828 Presence of other vascular implants and grafts: Secondary | ICD-10-CM

## 2022-07-09 DIAGNOSIS — Z86718 Personal history of other venous thrombosis and embolism: Secondary | ICD-10-CM | POA: Diagnosis not present

## 2022-07-09 MED ORDER — HEPARIN SOD (PORK) LOCK FLUSH 100 UNIT/ML IV SOLN
500.0000 [IU] | Freq: Once | INTRAVENOUS | Status: AC | PRN
  Administered 2022-07-09: 500 [IU]

## 2022-07-09 MED ORDER — LORATADINE 10 MG PO TABS
10.0000 mg | ORAL_TABLET | Freq: Once | ORAL | Status: AC
  Administered 2022-07-09: 10 mg via ORAL
  Filled 2022-07-09: qty 1

## 2022-07-09 MED ORDER — SODIUM CHLORIDE 0.9% FLUSH
10.0000 mL | Freq: Once | INTRAVENOUS | Status: AC | PRN
  Administered 2022-07-09: 10 mL

## 2022-07-09 MED ORDER — SODIUM CHLORIDE 0.9 % IV SOLN: Freq: Once | INTRAVENOUS | Status: AC

## 2022-07-09 MED ORDER — SODIUM CHLORIDE 0.9 % IV SOLN
300.0000 mg | Freq: Once | INTRAVENOUS | Status: AC
  Administered 2022-07-09: 300 mg via INTRAVENOUS
  Filled 2022-07-09: qty 300

## 2022-07-09 MED ORDER — ACETAMINOPHEN 325 MG PO TABS
650.0000 mg | ORAL_TABLET | Freq: Once | ORAL | Status: AC
  Administered 2022-07-09: 650 mg via ORAL
  Filled 2022-07-09: qty 2

## 2022-07-09 NOTE — Patient Instructions (Signed)

## 2022-07-17 ENCOUNTER — Other Ambulatory Visit: Payer: TRICARE For Life (TFL)

## 2022-07-24 ENCOUNTER — Ambulatory Visit: Payer: TRICARE For Life (TFL) | Admitting: Hematology

## 2022-08-20 ENCOUNTER — Inpatient Hospital Stay: Payer: Medicare Other | Attending: Hematology | Admitting: Hematology

## 2022-08-20 ENCOUNTER — Inpatient Hospital Stay: Payer: Medicare Other

## 2022-08-20 VITALS — BP 154/86 | HR 79 | Temp 97.7°F | Resp 16 | Wt 209.5 lb

## 2022-08-20 DIAGNOSIS — C9001 Multiple myeloma in remission: Secondary | ICD-10-CM

## 2022-08-20 DIAGNOSIS — G629 Polyneuropathy, unspecified: Secondary | ICD-10-CM | POA: Insufficient documentation

## 2022-08-20 DIAGNOSIS — C9000 Multiple myeloma not having achieved remission: Secondary | ICD-10-CM | POA: Diagnosis not present

## 2022-08-20 DIAGNOSIS — Z86718 Personal history of other venous thrombosis and embolism: Secondary | ICD-10-CM | POA: Insufficient documentation

## 2022-08-20 DIAGNOSIS — D649 Anemia, unspecified: Secondary | ICD-10-CM | POA: Insufficient documentation

## 2022-08-20 DIAGNOSIS — Z452 Encounter for adjustment and management of vascular access device: Secondary | ICD-10-CM | POA: Insufficient documentation

## 2022-08-20 DIAGNOSIS — Z95828 Presence of other vascular implants and grafts: Secondary | ICD-10-CM

## 2022-08-20 LAB — IRON AND IRON BINDING CAPACITY (CC-WL,HP ONLY)
Iron: 112 ug/dL (ref 28–170)
Saturation Ratios: 32 % — ABNORMAL HIGH (ref 10.4–31.8)
TIBC: 353 ug/dL (ref 250–450)
UIBC: 241 ug/dL (ref 148–442)

## 2022-08-20 LAB — CMP (CANCER CENTER ONLY)
ALT: 25 U/L (ref 0–44)
AST: 23 U/L (ref 15–41)
Albumin: 4.1 g/dL (ref 3.5–5.0)
Alkaline Phosphatase: 51 U/L (ref 38–126)
Anion gap: 7 (ref 5–15)
BUN: 12 mg/dL (ref 8–23)
CO2: 24 mmol/L (ref 22–32)
Calcium: 9 mg/dL (ref 8.9–10.3)
Chloride: 109 mmol/L (ref 98–111)
Creatinine: 0.8 mg/dL (ref 0.44–1.00)
GFR, Estimated: >60 mL/min (ref >60)
Glucose, Bld: 82 mg/dL (ref 70–99)
Potassium: 3.9 mmol/L (ref 3.5–5.1)
Sodium: 140 mmol/L (ref 135–145)
Total Bilirubin: 0.8 mg/dL (ref 0.3–1.2)
Total Protein: 6.8 g/dL (ref 6.5–8.1)

## 2022-08-20 LAB — CBC WITH DIFFERENTIAL (CANCER CENTER ONLY)
Abs Immature Granulocytes: 0 10*3/uL (ref 0.00–0.07)
Basophils Absolute: 0 10*3/uL (ref 0.0–0.1)
Basophils Relative: 1 %
Eosinophils Absolute: 0 10*3/uL (ref 0.0–0.5)
Eosinophils Relative: 1 %
HCT: 35.2 % — ABNORMAL LOW (ref 36.0–46.0)
Hemoglobin: 11.8 g/dL — ABNORMAL LOW (ref 12.0–15.0)
Immature Granulocytes: 0 %
Lymphocytes Relative: 53 %
Lymphs Abs: 2 10*3/uL (ref 0.7–4.0)
MCH: 31.2 pg (ref 26.0–34.0)
MCHC: 33.5 g/dL (ref 30.0–36.0)
MCV: 93.1 fL (ref 80.0–100.0)
Monocytes Absolute: 0.4 10*3/uL (ref 0.1–1.0)
Monocytes Relative: 10 %
Neutro Abs: 1.3 10*3/uL — ABNORMAL LOW (ref 1.7–7.7)
Neutrophils Relative %: 35 %
Platelet Count: 160 10*3/uL (ref 150–400)
RBC: 3.78 MIL/uL — ABNORMAL LOW (ref 3.87–5.11)
RDW: 14.1 % (ref 11.5–15.5)
WBC Count: 3.8 10*3/uL — ABNORMAL LOW (ref 4.0–10.5)
nRBC: 0 % (ref 0.0–0.2)

## 2022-08-20 LAB — FERRITIN: Ferritin: 71 ng/mL (ref 11–307)

## 2022-08-20 MED ORDER — SODIUM CHLORIDE 0.9% FLUSH
10.0000 mL | Freq: Once | INTRAVENOUS | Status: AC
  Administered 2022-08-20: 10 mL

## 2022-08-20 MED ORDER — HEPARIN SOD (PORK) LOCK FLUSH 100 UNIT/ML IV SOLN
500.0000 [IU] | Freq: Once | INTRAVENOUS | Status: AC
  Administered 2022-08-20: 500 [IU]

## 2022-08-20 NOTE — Progress Notes (Signed)
HEMATOLOGY/ONCOLOGY PHONE VISIT NOTE  Date of Service: 08/20/2022   PCP: Marzetta Board MD  CHIEF COMPLAINTS/PURPOSE OF CONSULTATION:  Follow-up for continued evaluation and management of multiple myeloma  HISTORY OF PRESENTING ILLNESS:  See previous note for details on initial presentation  INTERVAL HISTORY:  Madison Parker is a 71 y.o. female who is here to continue evaluation and management of multiple myeloma.   She was last seen by me on 06/19/2022 and was doing well overall without any issues.   She reports she has been doing well during today's visit without any new concerns. She notes the IV iron has been helping her with her energy.   She complains of skin rash around her neck that she noticed it yesterday. She reports that her skin rashes were better when she was using an ointment, but she has been noticing skin rashes again. She reports that some of her skin rashes are itchy. She has tried benadryl, which helped her itchiness.   She reports her breathing as been better and her energy is getting better as well. She notes that she had cough and mucus after her chemotherapy.   She denies back pain, chest pain, leg swelling, and abdominal pain. She denies any new medicine.  She wants to continue to wait for couple of months and then might start maintenance.   She denies of receiving any vaccines.    MEDICAL HISTORY:  Past Medical History:  Diagnosis Date   Headache    Low back pain    SBO (small bowel obstruction) ([place]) 2010   Smoldering multiple myeloma   Previous history of hypothyroidism- not currently medications Anxiety Obesity .Body mass index is 35.96 kg/m. 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 Lumbosacral radiculopathy Left hip pain  SURGICAL HISTORY: Past Surgical History:  Procedure Laterality Date   ABDOMINAL HYSTERECTOMY     complete   BACK SURGERY     lower at [facility]    COLONOSCOPY WITH PROPOFOL N/A 08/23/2020   Procedure: COLONOSCOPY WITH PROPOFOL;  Surgeon: Harvel Quale, MD;  Location: [facility];  Service: Gastroenterology;  Laterality: N/A;  900   colonscopy  2014   GASTRIC BYPASS  yrs ago   HERNIA REPAIR     IR IMAGING GUIDED PORT INSERTION  04/20/2019   KNEE SURGERY  06/04/2009   both knees replaced    POLYPECTOMY  08/23/2020   Procedure: POLYPECTOMY;  Surgeon: Harvel Quale, MD;  Location: [facility];  Service: Gastroenterology;;   TONSILLECTOMY  age 64   TOTAL HIP ARTHROPLASTY Left 08/16/2017   Procedure: LEFT TOTAL HIP ARTHROPLASTY ANTERIOR APPROACH;  Surgeon: Dorna Leitz, MD;  Location: [facility];  Service: Orthopedics;  Laterality: Left;  Spinal surgery April 2017  SOCIAL HISTORY: Social History   Socioeconomic History   Marital status: Married    Spouse name: Not on file   Number of children: Not on file   Years of education: Not on file   Highest education level: Not on file  Occupational History   Not on file  Tobacco Use   Smoking status: Former    Packs/day: 0.50    Years: 10.00    Total pack years: 5.00    Types: Cigarettes   Smokeless tobacco: Never   Tobacco comments:    quit 1977  Vaping Use   Vaping Use: Never used  Substance and Sexual Activity   Alcohol use: Yes    Comment: occ  Drug use: No   Sexual activity: Not on file  Other Topics Concern   Not on file  Social History Narrative   Not on file   Social Determinants of Health   Financial Resource Strain: Not on file  Food Insecurity: Not on file  Transportation Needs: Not on file  Physical Activity: Not on file  Stress: Not on file  Social Connections: Not on file  Intimate Partner Violence: Not At Risk (03/22/2021)   Humiliation, Afraid, Rape, and Kick questionnaire    Fear of Current or Ex-Partner: No    Emotionally Abused: No    Physically Abused: No    Sexually Abused: No  Occasional alcohol use Former smoker and smoked  1 pack per week for about 9 years has since quit.  FAMILY HISTORY: Family History  Problem Relation Age of Onset   Breast cancer Neg Hx     ALLERGIES:  is allergic to codeine.  MEDICATIONS:  Current Outpatient Medications  Medication Sig Dispense Refill   acyclovir (ZOVIRAX) 800 MG tablet Take 0.5 tablets (400 mg total) by mouth 2 (two) times daily. 60 tablet 11   Calcium Carbonate-Vitamin D 600-200 MG-UNIT TABS Calcium 600 with Vitamin D3 600 mg-5 mcg (200 unit) tablet  Take 1 tablet every day by oral route as directed.     Cholecalciferol 1.25 MG (50000 UT) TABS Take by mouth.     fluconazole (DIFLUCAN) 100 MG tablet Take 1 tablet (100 mg total) by mouth daily. 10 tablet 0   FOLIC ACID PO Take 6,286 mg by mouth daily.      lidocaine-prilocaine (EMLA) cream Apply 1 application. topically as needed. 30 g 2   NYSTATIN powder Apply topically 2 (two) times daily. (Patient not taking: Reported on 12/27/2021) 15 g 1   Probiotic Product (PROBIOTIC DAILY PO) Take 2 capsules by mouth daily. Gummies     vitamin B-12 (CYANOCOBALAMIN) 1000 MCG tablet Take 1,000 mcg by mouth daily.      Vitamin D, Ergocalciferol, (DRISDOL) 1.25 MG (50000 UNIT) CAPS capsule TAKE 1 CAPSULE ONCE A WEEK (Patient taking differently: Take 50,000 Units by mouth every 7 (seven) days. Sunday) 13 capsule 3   No current facility-administered medications for this visit.   Facility-Administered Medications Ordered in Other Visits  Medication Dose Route Frequency Provider Last Rate Last Admin   sodium chloride flush (NS) 0.9 % injection 10 mL  10 mL Intracatheter PRN Brunetta Genera, MD   10 mL at 05/11/19 1437    REVIEW OF SYSTEMS:  .10 Point review of Systems was done is negative except as noted above.  PHYSICAL EXAMINATION: .BP (!) 154/86 (BP Location: Left Arm, Patient Position: Sitting) Comment: Nurse notified  Pulse 79   Temp 97.7 F (36.5 C) (Temporal)   Resp 16   Wt 209 lb 8 oz (95 kg)   SpO2 98%   BMI  35.96 kg/m  . GENERAL:alert, in no acute distress and comfortable SKIN: no acute rashes, no significant lesions EYES: conjunctiva are pink and non-injected, sclera anicteric OROPHARYNX: MMM, no exudates, no oropharyngeal erythema or ulceration NECK: supple, no JVD LYMPH:  no palpable lymphadenopathy in the cervical, axillary or inguinal regions LUNGS: clear to auscultation b/l with normal respiratory effort HEART: regular rate & rhythm ABDOMEN:  normoactive bowel sounds , non tender, not distended. Extremity: no pedal edema PSYCH: alert & oriented x 3 with fluent speech NEURO: no focal motor/sensory deficits   LABORATORY DATA:  I have reviewed the data as listed  .  Latest Ref Rng & Units 08/20/2022    8:53 AM 06/25/2022   10:56 AM 06/12/2022    8:52 AM  CBC  WBC 4.0 - 10.5 K/uL 3.8  3.8  3.3   Hemoglobin 12.0 - 15.0 g/dL 11.8  10.2  9.7   Hematocrit 36.0 - 46.0 % 35.2  32.7  29.9   Platelets 150 - 400 K/uL 160  185  168    CBC    Component Value Date/Time   WBC 3.8 (L) 08/20/2022 0853   WBC 3.3 (L) 10/04/2021 0902   RBC 3.78 (L) 08/20/2022 0853   HGB 11.8 (L) 08/20/2022 0853   HGB 11.3 (L) 09/30/2017 1145   HCT 35.2 (L) 08/20/2022 0853   HCT 35.1 09/30/2017 1145   PLT 160 08/20/2022 0853   PLT 172 09/30/2017 1145   MCV 93.1 08/20/2022 0853   MCV 97.5 09/30/2017 1145   MCH 31.2 08/20/2022 0853   MCHC 33.5 08/20/2022 0853   RDW 14.1 08/20/2022 0853   RDW 14.0 09/30/2017 1145   LYMPHSABS 2.0 08/20/2022 0853   LYMPHSABS 1.3 09/30/2017 1145   MONOABS 0.4 08/20/2022 0853   MONOABS 0.2 09/30/2017 1145   EOSABS 0.0 08/20/2022 0853   EOSABS 0.0 09/30/2017 1145   EOSABS 0.1 06/03/2014 1003   BASOSABS 0.0 08/20/2022 0853   BASOSABS 0.0 09/30/2017 1145     .    Latest Ref Rng & Units 08/20/2022    8:53 AM 06/25/2022   10:56 AM 06/12/2022    8:52 AM  CMP  Glucose 70 - 99 mg/dL 82  91  94   BUN 8 - 23 mg/dL _0 Creatinine 0.44 - 1.00 mg/dL 0.80  0.87   0.66   Sodium 135 - 145 mmol/L 140  138  138   Potassium 3.5 - 5.1 mmol/L 3.9  3.9  3.9   Chloride 98 - 111 mmol/L 109  108  109   CO2 22 - 32 mmol/L _1 Calcium 8.9 - 10.3 mg/dL 9.0  9.1  9.0   Total Protein 6.5 - 8.1 g/dL 6.8  6.5  6.3   Total Bilirubin 0.3 - 1.2 mg/dL 0.8  0.6  0.9   Alkaline Phos 38 - 126 U/L 51  52  55   AST 15 - 41 U/L _2 ALT 0 - 44 U/L _3 03/11/19 BM Bx:   03/11/19 Cytogenetics:            RADIOGRAPHIC STUDIES: I have personally reviewed the radiological images as listed and agreed with the findings in the report. No results found.   Bone survey 11/08/2016 IMPRESSION: 1. Small lytic lesion in the right scapula. 2. Possible small lytic lesion in the left scapula. 3. Postoperative changes. 4. Significant degenerative changes in both hips, left greater than right. 5. No evidence for acute fracture     Electronically Signed   By: Nolon Nations M.D.   On: 11/08/2016 16:19   ASSESSMENT & PLAN:   71 y.o. very pleasant lady with history of   1) R-ISS 3 light chain multiple myeloma (concern for small lytic lesions in left and right scapulae) - (appears light chain producing) and Progressive anemia This was apparently diagnosed as smoldering myeloma in 2011 by her hematologist in [place]. No renal failure/hypercalcemia. Bone survey with concern for possible small lytic lesions in B/L scapulae. PET/CT scan  on 03/12/2017 showed no concerning bone lesions. Initial Bm Bx with 17% kappa restricted plasma cells consistent with plasma cell neoplasm. No treatment so far. 03/11/19 BM Bx revealed involvement by 50% plasma cells; genetics -high risk t(14;16), previous genetics from April 2018 BM Bx were standard risk 03/16/19 MRI Lumbar reveals several explanations for her lower back pain including L2-L3 stenosis, but reassuringly, there was not evidence of involvement by multiple myeloma 04/01/19 PET/CT which revealed no  FDG evidence of active multiple myeloma within the skeleton. No evidence of lytic lesions within the skeleton or soft tissue Plasmacytoma. 05/07/2019 DXA scan revealed osteopenia, T-score of -1.2.  2) Chronic leukopenia - ? Related to SMM vs other nutritional deficiencies (h/o gastric bypass surgery put her at risk for nutritional deficiencies).  WBC counts of have been close to normal recently today at 3.3k with an Mona of 1200. B12 levels WNL Ferritin adequate ?additional factor -recent NSAID use. ?element of benign ethnic neutropenia. Has not had any issues with frequent infection.   3) Neuropathy/radiculopathy -Continue follow up with orthopedics  4) history of superficial Venous Thrombosis of the left small saphenous vein  06/24/2019 US venous left : Right: There is no evidence of deep vein thrombosis in the lower extremity. No cystic structure found in the popliteal fossa. Left: Findings consistent with acute superficial vein thrombosis involving the left small saphenous vein. There is no evidence of deep vein thrombosis in the lower extremity.   PLAN: -Patient notes no acute new clinical concerns suggestive of myeloma progression at this time. -Labs done today show stable chronic anemia with a hemoglobin of 11.8k, WBC count of 3.8k and platelets of 160k. Labs showed stale CMP. Myeloma panel no no M spike SFLC WNL Ferritin 71 with iron saturation of 32% -Patient would like to stay on a treatment break from her [email or web address] this time. -discussed recommendations for vaccinations including flu shot, RSV vaccine and Covid 19 booster vaccination. - FOLLOW UP:  RTC with Dr Irene Limbo in 2 months Labs 1 week prior to clinic visit   The total time spent in the appointment was 21 minutes* .  All of the patient's questions were answered with apparent satisfaction. The patient knows to call the clinic with any problems, questions or concerns.   Zettie Cooley, am acting as a scribe for Sullivan Lone, MD.  Sullivan Lone MD [place] AAHIVMS [facility] Hematology/Oncology Physician [facility]  .*Total Encounter Time as defined by the Centers for Medicare and Medicaid Services includes, in addition to the face-to-face time of a patient visit (documented in the note above) non-face-to-face time: obtaining and reviewing outside history, ordering and reviewing medications, tests or procedures, care coordination (communications with other health care professionals or caregivers) and documentation in the medical record.

## 2022-08-21 LAB — KAPPA/LAMBDA LIGHT CHAINS
Kappa free light chain: 14.9 mg/L (ref 3.3–19.4)
Kappa, lambda light chain ratio: 1.07 (ref 0.26–1.65)
Lambda free light chains: 13.9 mg/L (ref 5.7–26.3)

## 2022-08-23 ENCOUNTER — Other Ambulatory Visit: Payer: Self-pay

## 2022-08-23 LAB — MULTIPLE MYELOMA PANEL, SERUM
Albumin SerPl Elph-Mcnc: 3.6 g/dL (ref 2.9–4.4)
Albumin/Glob SerPl: 1.4 (ref 0.7–1.7)
Alpha 1: 0.2 g/dL (ref 0.0–0.4)
Alpha2 Glob SerPl Elph-Mcnc: 0.7 g/dL (ref 0.4–1.0)
B-Globulin SerPl Elph-Mcnc: 0.8 g/dL (ref 0.7–1.3)
Gamma Glob SerPl Elph-Mcnc: 0.9 g/dL (ref 0.4–1.8)
Globulin, Total: 2.6 g/dL (ref 2.2–3.9)
IgA: 96 mg/dL (ref 87–352)
IgG (Immunoglobin G), Serum: 1053 mg/dL (ref 586–1602)
IgM (Immunoglobulin M), Srm: 32 mg/dL (ref 26–217)
Total Protein ELP: 6.2 g/dL (ref 6.0–8.5)

## 2022-08-26 ENCOUNTER — Encounter: Payer: Self-pay | Admitting: Hematology

## 2022-08-27 DIAGNOSIS — L258 Unspecified contact dermatitis due to other agents: Secondary | ICD-10-CM | POA: Diagnosis not present

## 2022-08-27 DIAGNOSIS — L308 Other specified dermatitis: Secondary | ICD-10-CM | POA: Diagnosis not present

## 2022-09-05 ENCOUNTER — Inpatient Hospital Stay: Payer: Medicare Other

## 2022-09-19 ENCOUNTER — Inpatient Hospital Stay: Payer: Medicare Other

## 2022-09-21 DIAGNOSIS — Z9484 Stem cells transplant status: Secondary | ICD-10-CM | POA: Diagnosis not present

## 2022-09-21 DIAGNOSIS — R0789 Other chest pain: Secondary | ICD-10-CM | POA: Diagnosis not present

## 2022-09-21 DIAGNOSIS — R9431 Abnormal electrocardiogram [ECG] [EKG]: Secondary | ICD-10-CM | POA: Diagnosis not present

## 2022-09-21 DIAGNOSIS — I3139 Other pericardial effusion (noninflammatory): Secondary | ICD-10-CM | POA: Diagnosis not present

## 2022-09-21 DIAGNOSIS — C9001 Multiple myeloma in remission: Secondary | ICD-10-CM | POA: Diagnosis not present

## 2022-10-19 ENCOUNTER — Other Ambulatory Visit: Payer: Self-pay | Admitting: *Deleted

## 2022-10-19 DIAGNOSIS — C9000 Multiple myeloma not having achieved remission: Secondary | ICD-10-CM

## 2022-10-22 ENCOUNTER — Inpatient Hospital Stay: Payer: Medicare Other | Attending: Hematology

## 2022-10-22 ENCOUNTER — Other Ambulatory Visit: Payer: Self-pay

## 2022-10-22 ENCOUNTER — Inpatient Hospital Stay: Payer: Medicare Other

## 2022-10-22 DIAGNOSIS — C9000 Multiple myeloma not having achieved remission: Secondary | ICD-10-CM | POA: Diagnosis not present

## 2022-10-22 DIAGNOSIS — Z452 Encounter for adjustment and management of vascular access device: Secondary | ICD-10-CM | POA: Insufficient documentation

## 2022-10-22 DIAGNOSIS — G629 Polyneuropathy, unspecified: Secondary | ICD-10-CM | POA: Diagnosis not present

## 2022-10-22 DIAGNOSIS — Z86718 Personal history of other venous thrombosis and embolism: Secondary | ICD-10-CM | POA: Insufficient documentation

## 2022-10-22 DIAGNOSIS — R21 Rash and other nonspecific skin eruption: Secondary | ICD-10-CM | POA: Insufficient documentation

## 2022-10-22 DIAGNOSIS — Z95828 Presence of other vascular implants and grafts: Secondary | ICD-10-CM

## 2022-10-22 LAB — CBC WITH DIFFERENTIAL (CANCER CENTER ONLY)
Abs Immature Granulocytes: 0.01 10*3/uL (ref 0.00–0.07)
Basophils Absolute: 0 10*3/uL (ref 0.0–0.1)
Basophils Relative: 1 %
Eosinophils Absolute: 0.1 10*3/uL (ref 0.0–0.5)
Eosinophils Relative: 2 %
HCT: 32.9 % — ABNORMAL LOW (ref 36.0–46.0)
Hemoglobin: 11.3 g/dL — ABNORMAL LOW (ref 12.0–15.0)
Immature Granulocytes: 0 %
Lymphocytes Relative: 42 %
Lymphs Abs: 1.8 10*3/uL (ref 0.7–4.0)
MCH: 33 pg (ref 26.0–34.0)
MCHC: 34.3 g/dL (ref 30.0–36.0)
MCV: 96.2 fL (ref 80.0–100.0)
Monocytes Absolute: 0.4 10*3/uL (ref 0.1–1.0)
Monocytes Relative: 10 %
Neutro Abs: 1.9 10*3/uL (ref 1.7–7.7)
Neutrophils Relative %: 45 %
Platelet Count: 173 10*3/uL (ref 150–400)
RBC: 3.42 MIL/uL — ABNORMAL LOW (ref 3.87–5.11)
RDW: 12 % (ref 11.5–15.5)
WBC Count: 4.3 10*3/uL (ref 4.0–10.5)
nRBC: 0 % (ref 0.0–0.2)

## 2022-10-22 LAB — CMP (CANCER CENTER ONLY)
ALT: 31 U/L (ref 0–44)
AST: 29 U/L (ref 15–41)
Albumin: 4 g/dL (ref 3.5–5.0)
Alkaline Phosphatase: 58 U/L (ref 38–126)
Anion gap: 5 (ref 5–15)
BUN: 10 mg/dL (ref 8–23)
CO2: 25 mmol/L (ref 22–32)
Calcium: 9 mg/dL (ref 8.9–10.3)
Chloride: 109 mmol/L (ref 98–111)
Creatinine: 0.79 mg/dL (ref 0.44–1.00)
GFR, Estimated: >60 mL/min (ref >60)
Glucose, Bld: 82 mg/dL (ref 70–99)
Potassium: 3.8 mmol/L (ref 3.5–5.1)
Sodium: 139 mmol/L (ref 135–145)
Total Bilirubin: 0.6 mg/dL (ref 0.3–1.2)
Total Protein: 6.3 g/dL — ABNORMAL LOW (ref 6.5–8.1)

## 2022-10-22 LAB — IRON AND IRON BINDING CAPACITY (CC-WL,HP ONLY)
Iron: 70 ug/dL (ref 28–170)
Saturation Ratios: 21 % (ref 10.4–31.8)
TIBC: 329 ug/dL (ref 250–450)
UIBC: 259 ug/dL (ref 148–442)

## 2022-10-22 LAB — FERRITIN: Ferritin: 79 ng/mL (ref 11–307)

## 2022-10-22 MED ORDER — HEPARIN SOD (PORK) LOCK FLUSH 100 UNIT/ML IV SOLN
500.0000 [IU] | Freq: Once | INTRAVENOUS | Status: AC
  Administered 2022-10-22: 500 [IU]

## 2022-10-22 MED ORDER — SODIUM CHLORIDE 0.9% FLUSH
10.0000 mL | Freq: Once | INTRAVENOUS | Status: AC
  Administered 2022-10-22: 10 mL

## 2022-10-23 LAB — KAPPA/LAMBDA LIGHT CHAINS
Kappa free light chain: 15.9 mg/L (ref 3.3–19.4)
Kappa, lambda light chain ratio: 1.12 (ref 0.26–1.65)
Lambda free light chains: 14.2 mg/L (ref 5.7–26.3)

## 2022-10-25 LAB — MULTIPLE MYELOMA PANEL, SERUM
Albumin SerPl Elph-Mcnc: 3.6 g/dL (ref 2.9–4.4)
Albumin/Glob SerPl: 1.4 (ref 0.7–1.7)
Alpha 1: 0.2 g/dL (ref 0.0–0.4)
Alpha2 Glob SerPl Elph-Mcnc: 0.6 g/dL (ref 0.4–1.0)
B-Globulin SerPl Elph-Mcnc: 0.9 g/dL (ref 0.7–1.3)
Gamma Glob SerPl Elph-Mcnc: 0.9 g/dL (ref 0.4–1.8)
Globulin, Total: 2.6 g/dL (ref 2.2–3.9)
IgA: 84 mg/dL — ABNORMAL LOW (ref 87–352)
IgG (Immunoglobin G), Serum: 952 mg/dL (ref 586–1602)
IgM (Immunoglobulin M), Srm: 32 mg/dL (ref 26–217)
Total Protein ELP: 6.2 g/dL (ref 6.0–8.5)

## 2022-10-29 ENCOUNTER — Other Ambulatory Visit: Payer: Self-pay

## 2022-10-29 ENCOUNTER — Inpatient Hospital Stay (HOSPITAL_BASED_OUTPATIENT_CLINIC_OR_DEPARTMENT_OTHER): Payer: Medicare Other | Admitting: Hematology

## 2022-10-29 VITALS — BP 144/71 | HR 79 | Temp 98.1°F | Resp 18 | Ht 64.0 in | Wt 210.0 lb

## 2022-10-29 DIAGNOSIS — L2084 Intrinsic (allergic) eczema: Secondary | ICD-10-CM | POA: Diagnosis not present

## 2022-10-29 DIAGNOSIS — C9001 Multiple myeloma in remission: Secondary | ICD-10-CM

## 2022-10-29 DIAGNOSIS — Z452 Encounter for adjustment and management of vascular access device: Secondary | ICD-10-CM | POA: Diagnosis not present

## 2022-10-29 DIAGNOSIS — Z86718 Personal history of other venous thrombosis and embolism: Secondary | ICD-10-CM | POA: Diagnosis not present

## 2022-10-29 DIAGNOSIS — G629 Polyneuropathy, unspecified: Secondary | ICD-10-CM | POA: Diagnosis not present

## 2022-10-29 DIAGNOSIS — C9000 Multiple myeloma not having achieved remission: Secondary | ICD-10-CM | POA: Diagnosis not present

## 2022-10-29 DIAGNOSIS — R21 Rash and other nonspecific skin eruption: Secondary | ICD-10-CM | POA: Diagnosis not present

## 2022-10-29 MED ORDER — FLUCONAZOLE 100 MG PO TABS: 100.0000 mg | ORAL_TABLET | Freq: Every day | ORAL | 0 refills | Status: DC

## 2022-10-29 MED ORDER — PREDNISONE 10 MG PO TABS: ORAL_TABLET | ORAL | 0 refills | Status: AC

## 2022-10-29 NOTE — Progress Notes (Signed)
HEMATOLOGY/ONCOLOGY PHONE VISIT NOTE  Date of Service: 10/29/2022   PCP: Marzetta Board MD  CHIEF COMPLAINTS/PURPOSE OF CONSULTATION:  Follow-up for continued evaluation and management of multiple myeloma  HISTORY OF PRESENTING ILLNESS:  See previous note for details on initial presentation  INTERVAL HISTORY:  Madison Parker is a 72 y.o. female who is here to continue evaluation and management of multiple myeloma.   Patient was last seen by me on 08/20/2022 and she complained of itchy skin rashes around her neck.She was taking Benadryl which was helping her itchiness. During the last visit, she was unsure about starting her maintenance dose.  Patient reports she has been doing well without any new medical concerns since our last visit. She does complain of mild fatigue, but not severe.   She continues to notice skin rashes near her neck, which have radiated to other parts of the body. She has been to her Dermatologist, who prescribed her an steroid ointment. Patient notes that the ointment helped for a while, but it does not help anymore. She denies of any changes in medication and denies any other family members with skin rashes. Patient denies any life-style changes and denies new detergent. She has tried Benadryl, which occasionally helps her. She wants to change her Dermatologist. She denies previous history of Eczema or any other skin problems. She denies any tick bites.   Patient reports that her skin rashes start appearing after receiving her Shingles vaccine.   She denies fever, chills, night sweats, abdominal pain, back pain, unexpected weight loss, or leg swelling.   Patient is interested in starting plant-based diet and had some question, which were answered.   She is complaint with all of her medications without any changes in dose.   MEDICAL HISTORY:  Past Medical History:  Diagnosis Date   Headache    Low back pain    SBO (small bowel obstruction) ([place]) 2010    Smoldering multiple myeloma   Previous history of hypothyroidism- not currently medications Anxiety Obesity .There is no height or weight on file to calculate BMI. 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 Lumbosacral radiculopathy Left hip pain  SURGICAL HISTORY: Past Surgical History:  Procedure Laterality Date   ABDOMINAL HYSTERECTOMY     complete   BACK SURGERY     lower at [facility]   COLONOSCOPY WITH PROPOFOL N/A 08/23/2020   Procedure: COLONOSCOPY WITH PROPOFOL;  Surgeon: Harvel Quale, MD;  Location: [facility];  Service: Gastroenterology;  Laterality: N/A;  900   colonscopy  2014   GASTRIC BYPASS  yrs ago   HERNIA REPAIR     IR IMAGING GUIDED PORT INSERTION  04/20/2019   KNEE SURGERY  06/04/2009   both knees replaced    POLYPECTOMY  08/23/2020   Procedure: POLYPECTOMY;  Surgeon: Harvel Quale, MD;  Location: [facility];  Service: Gastroenterology;;   TONSILLECTOMY  age 47   TOTAL HIP ARTHROPLASTY Left 08/16/2017   Procedure: LEFT TOTAL HIP ARTHROPLASTY ANTERIOR APPROACH;  Surgeon: Dorna Leitz, MD;  Location: [facility];  Service: Orthopedics;  Laterality: Left;  Spinal surgery April 2017  SOCIAL HISTORY: Social History   Socioeconomic History   Marital status: Married    Spouse name: Not on file   Number of children: Not on file   Years of education: Not on file   Highest education level: Not on file  Occupational History   Not on file  Tobacco Use  Smoking status: Former    Packs/day: 0.50    Years: 10.00    Total pack years: 5.00    Types: Cigarettes   Smokeless tobacco: Never   Tobacco comments:    quit 1977  Vaping Use   Vaping Use: Never used  Substance and Sexual Activity   Alcohol use: Yes    Comment: occ   Drug use: No   Sexual activity: Not on file  Other Topics Concern   Not on file  Social History Narrative   Not on file   Social Determinants of Health    Financial Resource Strain: Not on file  Food Insecurity: Not on file  Transportation Needs: Not on file  Physical Activity: Not on file  Stress: Not on file  Social Connections: Not on file  Intimate Partner Violence: Not At Risk (03/22/2021)   Humiliation, Afraid, Rape, and Kick questionnaire    Fear of Current or Ex-Partner: No    Emotionally Abused: No    Physically Abused: No    Sexually Abused: No  Occasional alcohol use Former smoker and smoked 1 pack per week for about 9 years has since quit.  FAMILY HISTORY: Family History  Problem Relation Age of Onset   Breast cancer Neg Hx     ALLERGIES:  is allergic to codeine.  MEDICATIONS:  Current Outpatient Medications  Medication Sig Dispense Refill   acyclovir (ZOVIRAX) 800 MG tablet Take 0.5 tablets (400 mg total) by mouth 2 (two) times daily. 60 tablet 11   Calcium Carbonate-Vitamin D 600-200 MG-UNIT TABS Calcium 600 with Vitamin D3 600 mg-5 mcg (200 unit) tablet  Take 1 tablet every day by oral route as directed.     Cholecalciferol 1.25 MG (50000 UT) TABS Take by mouth.     fluconazole (DIFLUCAN) 100 MG tablet Take 1 tablet (100 mg total) by mouth daily. 10 tablet 0   FOLIC ACID PO Take 1,027 mg by mouth daily.      lidocaine-prilocaine (EMLA) cream Apply 1 application. topically as needed. 30 g 2   NYSTATIN powder Apply topically 2 (two) times daily. (Patient not taking: Reported on 12/27/2021) 15 g 1   Probiotic Product (PROBIOTIC DAILY PO) Take 2 capsules by mouth daily. Gummies     vitamin B-12 (CYANOCOBALAMIN) 1000 MCG tablet Take 1,000 mcg by mouth daily.      Vitamin D, Ergocalciferol, (DRISDOL) 1.25 MG (50000 UNIT) CAPS capsule TAKE 1 CAPSULE ONCE A WEEK (Patient taking differently: Take 50,000 Units by mouth every 7 (seven) days. Sunday) 13 capsule 3   No current facility-administered medications for this visit.   Facility-Administered Medications Ordered in Other Visits  Medication Dose Route Frequency  Provider Last Rate Last Admin   sodium chloride flush (NS) 0.9 % injection 10 mL  10 mL Intracatheter PRN Brunetta Genera, MD   10 mL at 05/11/19 1437    REVIEW OF SYSTEMS:  .10 Point review of Systems was done is negative except as noted above.  PHYSICAL EXAMINATION: .BP (!) 144/71 (BP Location: Left Arm, Patient Position: Sitting) Comment: nurse notified  Pulse 79   Temp 98.1 F (36.7 C) (Temporal)   Resp 18   Ht '5\' 4"'$  (1.626 m)   Wt 210 lb (95.3 kg)   SpO2 99%   BMI 36.05 kg/m  . GENERAL:alert, in no acute distress and comfortable SKIN: no acute rashes, no significant lesions EYES: conjunctiva are pink and non-injected, sclera anicteric OROPHARYNX: MMM, no exudates, no oropharyngeal erythema or ulceration NECK:  supple, no JVD LYMPH:  no palpable lymphadenopathy in the cervical, axillary or inguinal regions LUNGS: clear to auscultation b/l with normal respiratory effort HEART: regular rate & rhythm ABDOMEN:  normoactive bowel sounds , non tender, not distended. Extremity: no pedal edema PSYCH: alert & oriented x 3 with fluent speech NEURO: no focal motor/sensory deficits   LABORATORY DATA:  I have reviewed the data as listed  .    Latest Ref Rng & Units 10/22/2022   10:39 AM 08/20/2022    8:53 AM 06/25/2022   10:56 AM  CBC  WBC 4.0 - 10.5 K/uL 4.3  3.8  3.8   Hemoglobin 12.0 - 15.0 g/dL 11.3  11.8  10.2   Hematocrit 36.0 - 46.0 % 32.9  35.2  32.7   Platelets 150 - 400 K/uL 173  160  185    CBC    Component Value Date/Time   WBC 4.3 10/22/2022 1039   WBC 3.3 (L) 10/04/2021 0902   RBC 3.42 (L) 10/22/2022 1039   HGB 11.3 (L) 10/22/2022 1039   HGB 11.3 (L) 09/30/2017 1145   HCT 32.9 (L) 10/22/2022 1039   HCT 35.1 09/30/2017 1145   PLT 173 10/22/2022 1039   PLT 172 09/30/2017 1145   MCV 96.2 10/22/2022 1039   MCV 97.5 09/30/2017 1145   MCH 33.0 10/22/2022 1039   MCHC 34.3 10/22/2022 1039   RDW 12.0 10/22/2022 1039   RDW 14.0 09/30/2017 1145   LYMPHSABS  1.8 10/22/2022 1039   LYMPHSABS 1.3 09/30/2017 1145   MONOABS 0.4 10/22/2022 1039   MONOABS 0.2 09/30/2017 1145   EOSABS 0.1 10/22/2022 1039   EOSABS 0.0 09/30/2017 1145   EOSABS 0.1 06/03/2014 1003   BASOSABS 0.0 10/22/2022 1039   BASOSABS 0.0 09/30/2017 1145     .    Latest Ref Rng & Units 10/22/2022   10:39 AM 08/20/2022    8:53 AM 06/25/2022   10:56 AM  CMP  Glucose 70 - 99 mg/dL 82  82  91   BUN 8 - 23 mg/dL '10  12  13   '$ Creatinine 0.44 - 1.00 mg/dL 0.79  0.80  0.87   Sodium 135 - 145 mmol/L 139  140  138   Potassium 3.5 - 5.1 mmol/L 3.8  3.9  3.9   Chloride 98 - 111 mmol/L 109  109  108   CO2 22 - 32 mmol/L '25  24  25   '$ Calcium 8.9 - 10.3 mg/dL 9.0  9.0  9.1   Total Protein 6.5 - 8.1 g/dL 6.3  6.8  6.5   Total Bilirubin 0.3 - 1.2 mg/dL 0.6  0.8  0.6   Alkaline Phos 38 - 126 U/L 58  51  52   AST 15 - 41 U/L '29  23  27   '$ ALT 0 - 44 U/L '31  25  26    '$ 03/11/19 BM Bx:   03/11/19 Cytogenetics:            RADIOGRAPHIC STUDIES: I have personally reviewed the radiological images as listed and agreed with the findings in the report. No results found.   Bone survey 11/08/2016 IMPRESSION: 1. Small lytic lesion in the right scapula. 2. Possible small lytic lesion in the left scapula. 3. Postoperative changes. 4. Significant degenerative changes in both hips, left greater than right. 5. No evidence for acute fracture     Electronically Signed   By: Nolon Nations M.D.   On: 11/08/2016 16:19   ASSESSMENT &  PLAN:   72 y.o. very pleasant lady with history of   1) R-ISS 3 light chain multiple myeloma (concern for small lytic lesions in left and right scapulae) - (appears light chain producing) and Progressive anemia This was apparently diagnosed as smoldering myeloma in 2011 by her hematologist in [place]. No renal failure/hypercalcemia. Bone survey with concern for possible small lytic lesions in B/L scapulae. PET/CT scan on 03/12/2017 showed no  concerning bone lesions. Initial Bm Bx with 17% kappa restricted plasma cells consistent with plasma cell neoplasm. No treatment so far. 03/11/19 BM Bx revealed involvement by 50% plasma cells; genetics -high risk t(14;16), previous genetics from April 2018 BM Bx were standard risk 03/16/19 MRI Lumbar reveals several explanations for her lower back pain including L2-L3 stenosis, but reassuringly, there was not evidence of involvement by multiple myeloma 04/01/19 PET/CT which revealed no FDG evidence of active multiple myeloma within the skeleton. No evidence of lytic lesions within the skeleton or soft tissue Plasmacytoma. 05/07/2019 DXA scan revealed osteopenia, T-score of -1.2.  2) Chronic leukopenia - ? Related to SMM vs other nutritional deficiencies (h/o gastric bypass surgery put her at risk for nutritional deficiencies).  WBC counts of have been close to normal recently today at 3.3k with an [place] of 1200. B12 levels WNL Ferritin adequate ?additional factor -recent NSAID use. ?element of benign ethnic neutropenia. Has not had any issues with frequent infection.   3) Neuropathy/radiculopathy -Continue follow up with orthopedics  4) history of superficial Venous Thrombosis of the left small saphenous vein  06/24/2019 US venous left : Right: There is no evidence of deep vein thrombosis in the lower extremity. No cystic structure found in the popliteal fossa. Left: Findings consistent with acute superficial vein thrombosis involving the left small saphenous vein. There is no evidence of deep vein thrombosis in the lower extremity.   PLAN: -Patient notes no acute new clinical concerns suggestive of myeloma progression at this time. -Discussed lab results from today, 10/22/2022, with the patient. CBC showed slightly decreased hemoglobin of 11.3 K and hematocrit of 32.9. CMP stable. -Myeloma panel  no M spike SFLC WNL Patient has no evidence of myeloma progression at this time and continues to  desire staying off maintenance treatments at this time. -Recommended going to Dermatologist and get a skin biopsy for her skin rashes.  -Recommended to continue iron supplement, B-Complex, and Vitamin-D supplement.  -prescribed short course off po prednisone and antifungal. -recommended anti-fungal shampoo, which could help her skin rashes.  -Referred to [facility] Dermatologist for likely atopic dermatitis.   FOLLOW UP: Referral to [facility] dermatology for likely atopic dermatitis Return to clinic with Dr. Irene Limbo in 2 months. Labs 1 week prior to clinic visit   The total time spent in the appointment was 30 minutes* .  All of the patient's questions were answered with apparent satisfaction. The patient knows to call the clinic with any problems, questions or concerns.   Sullivan Lone MD MS AAHIVMS [facility] Hematology/Oncology Physician [facility]  .*Total Encounter Time as defined by the Centers for Medicare and Medicaid Services includes, in addition to the face-to-face time of a patient visit (documented in the note above) non-face-to-face time: obtaining and reviewing outside history, ordering and reviewing medications, tests or procedures, care coordination (communications with other health care professionals or caregivers) and documentation in the medical record.   Zettie Cooley, am acting as a Education administrator for Sullivan Lone, MD.

## 2022-11-02 ENCOUNTER — Other Ambulatory Visit: Payer: Self-pay

## 2022-11-04 ENCOUNTER — Encounter: Payer: Self-pay | Admitting: Hematology

## 2022-11-28 ENCOUNTER — Other Ambulatory Visit: Payer: Self-pay | Admitting: Internal Medicine

## 2022-11-28 DIAGNOSIS — Z1231 Encounter for screening mammogram for malignant neoplasm of breast: Secondary | ICD-10-CM

## 2022-11-29 ENCOUNTER — Other Ambulatory Visit: Payer: Self-pay

## 2022-12-18 ENCOUNTER — Other Ambulatory Visit: Payer: Self-pay

## 2022-12-18 DIAGNOSIS — C9001 Multiple myeloma in remission: Secondary | ICD-10-CM

## 2022-12-19 ENCOUNTER — Inpatient Hospital Stay: Payer: Medicare Other | Attending: Hematology

## 2022-12-19 ENCOUNTER — Inpatient Hospital Stay: Payer: Medicare Other

## 2022-12-19 DIAGNOSIS — Z452 Encounter for adjustment and management of vascular access device: Secondary | ICD-10-CM | POA: Insufficient documentation

## 2022-12-19 DIAGNOSIS — C9000 Multiple myeloma not having achieved remission: Secondary | ICD-10-CM | POA: Insufficient documentation

## 2022-12-19 DIAGNOSIS — Z95828 Presence of other vascular implants and grafts: Secondary | ICD-10-CM

## 2022-12-19 DIAGNOSIS — E559 Vitamin D deficiency, unspecified: Secondary | ICD-10-CM | POA: Diagnosis not present

## 2022-12-19 DIAGNOSIS — D709 Neutropenia, unspecified: Secondary | ICD-10-CM | POA: Diagnosis not present

## 2022-12-19 DIAGNOSIS — C9001 Multiple myeloma in remission: Secondary | ICD-10-CM

## 2022-12-19 DIAGNOSIS — D649 Anemia, unspecified: Secondary | ICD-10-CM | POA: Insufficient documentation

## 2022-12-19 DIAGNOSIS — Z86718 Personal history of other venous thrombosis and embolism: Secondary | ICD-10-CM | POA: Diagnosis not present

## 2022-12-19 DIAGNOSIS — M8580 Other specified disorders of bone density and structure, unspecified site: Secondary | ICD-10-CM | POA: Insufficient documentation

## 2022-12-19 LAB — CMP (CANCER CENTER ONLY)
ALT: 22 U/L (ref 0–44)
AST: 21 U/L (ref 15–41)
Albumin: 4 g/dL (ref 3.5–5.0)
Alkaline Phosphatase: 59 U/L (ref 38–126)
Anion gap: 6 (ref 5–15)
BUN: 13 mg/dL (ref 8–23)
CO2: 24 mmol/L (ref 22–32)
Calcium: 9 mg/dL (ref 8.9–10.3)
Chloride: 110 mmol/L (ref 98–111)
Creatinine: 0.74 mg/dL (ref 0.44–1.00)
GFR, Estimated: >60 mL/min (ref >60)
Glucose, Bld: 106 mg/dL — ABNORMAL HIGH (ref 70–99)
Potassium: 3.5 mmol/L (ref 3.5–5.1)
Sodium: 140 mmol/L (ref 135–145)
Total Bilirubin: 0.8 mg/dL (ref 0.3–1.2)
Total Protein: 6.5 g/dL (ref 6.5–8.1)

## 2022-12-19 LAB — CBC WITH DIFFERENTIAL (CANCER CENTER ONLY)
Abs Immature Granulocytes: 0 10*3/uL (ref 0.00–0.07)
Basophils Absolute: 0 10*3/uL (ref 0.0–0.1)
Basophils Relative: 1 %
Eosinophils Absolute: 0 10*3/uL (ref 0.0–0.5)
Eosinophils Relative: 1 %
HCT: 34 % — ABNORMAL LOW (ref 36.0–46.0)
Hemoglobin: 11.6 g/dL — ABNORMAL LOW (ref 12.0–15.0)
Immature Granulocytes: 0 %
Lymphocytes Relative: 50 %
Lymphs Abs: 1.7 10*3/uL (ref 0.7–4.0)
MCH: 32.9 pg (ref 26.0–34.0)
MCHC: 34.1 g/dL (ref 30.0–36.0)
MCV: 96.3 fL (ref 80.0–100.0)
Monocytes Absolute: 0.3 10*3/uL (ref 0.1–1.0)
Monocytes Relative: 10 %
Neutro Abs: 1.3 10*3/uL — ABNORMAL LOW (ref 1.7–7.7)
Neutrophils Relative %: 38 %
Platelet Count: 144 10*3/uL — ABNORMAL LOW (ref 150–400)
RBC: 3.53 MIL/uL — ABNORMAL LOW (ref 3.87–5.11)
RDW: 12 % (ref 11.5–15.5)
WBC Count: 3.4 10*3/uL — ABNORMAL LOW (ref 4.0–10.5)
nRBC: 0 % (ref 0.0–0.2)

## 2022-12-19 LAB — IRON AND IRON BINDING CAPACITY (CC-WL,HP ONLY)
Iron: 100 ug/dL (ref 28–170)
Saturation Ratios: 30 % (ref 10.4–31.8)
TIBC: 329 ug/dL (ref 250–450)
UIBC: 229 ug/dL (ref 148–442)

## 2022-12-19 LAB — FERRITIN: Ferritin: 65 ng/mL (ref 11–307)

## 2022-12-19 MED ORDER — SODIUM CHLORIDE 0.9% FLUSH
10.0000 mL | Freq: Once | INTRAVENOUS | Status: AC
  Administered 2022-12-19: 10 mL

## 2022-12-19 MED ORDER — HEPARIN SOD (PORK) LOCK FLUSH 100 UNIT/ML IV SOLN
500.0000 [IU] | Freq: Once | INTRAVENOUS | Status: AC
  Administered 2022-12-19: 500 [IU]

## 2022-12-20 LAB — KAPPA/LAMBDA LIGHT CHAINS
Kappa free light chain: 14.5 mg/L (ref 3.3–19.4)
Kappa, lambda light chain ratio: 1.02 (ref 0.26–1.65)
Lambda free light chains: 14.2 mg/L (ref 5.7–26.3)

## 2022-12-24 ENCOUNTER — Other Ambulatory Visit: Payer: Self-pay

## 2022-12-24 DIAGNOSIS — C9000 Multiple myeloma not having achieved remission: Secondary | ICD-10-CM

## 2022-12-24 LAB — MULTIPLE MYELOMA PANEL, SERUM
Albumin SerPl Elph-Mcnc: 3.8 g/dL (ref 2.9–4.4)
Albumin/Glob SerPl: 1.8 — ABNORMAL HIGH (ref 0.7–1.7)
Alpha 1: 0.2 g/dL (ref 0.0–0.4)
Alpha2 Glob SerPl Elph-Mcnc: 0.4 g/dL (ref 0.4–1.0)
B-Globulin SerPl Elph-Mcnc: 0.8 g/dL (ref 0.7–1.3)
Gamma Glob SerPl Elph-Mcnc: 0.7 g/dL (ref 0.4–1.8)
Globulin, Total: 2.2 g/dL (ref 2.2–3.9)
IgA: 76 mg/dL — ABNORMAL LOW (ref 87–352)
IgG (Immunoglobin G), Serum: 915 mg/dL (ref 586–1602)
IgM (Immunoglobulin M), Srm: 31 mg/dL (ref 26–217)
Total Protein ELP: 6 g/dL (ref 6.0–8.5)

## 2022-12-26 ENCOUNTER — Inpatient Hospital Stay (HOSPITAL_BASED_OUTPATIENT_CLINIC_OR_DEPARTMENT_OTHER): Payer: Medicare Other | Admitting: Hematology

## 2022-12-26 ENCOUNTER — Other Ambulatory Visit: Payer: Self-pay

## 2022-12-26 VITALS — BP 150/82 | HR 72 | Temp 97.9°F | Resp 18 | Wt 211.3 lb

## 2022-12-26 DIAGNOSIS — E559 Vitamin D deficiency, unspecified: Secondary | ICD-10-CM | POA: Diagnosis not present

## 2022-12-26 DIAGNOSIS — C9001 Multiple myeloma in remission: Secondary | ICD-10-CM

## 2022-12-26 DIAGNOSIS — D649 Anemia, unspecified: Secondary | ICD-10-CM | POA: Diagnosis not present

## 2022-12-26 DIAGNOSIS — Z86718 Personal history of other venous thrombosis and embolism: Secondary | ICD-10-CM | POA: Diagnosis not present

## 2022-12-26 DIAGNOSIS — M8580 Other specified disorders of bone density and structure, unspecified site: Secondary | ICD-10-CM | POA: Diagnosis not present

## 2022-12-26 DIAGNOSIS — C9000 Multiple myeloma not having achieved remission: Secondary | ICD-10-CM | POA: Diagnosis not present

## 2022-12-26 DIAGNOSIS — D709 Neutropenia, unspecified: Secondary | ICD-10-CM | POA: Diagnosis not present

## 2022-12-26 NOTE — Progress Notes (Signed)
HEMATOLOGY/ONCOLOGY CLINIC NOTE  Date of Service: 12/26/2022   PCP: Marzetta Board MD  CHIEF COMPLAINTS/PURPOSE OF CONSULTATION:  Follow-up for continued evaluation and management of multiple myeloma  HISTORY OF PRESENTING ILLNESS:  See previous note for details on initial presentation  INTERVAL HISTORY:  Madison Parker is a 72 y.o. female who is here to continue evaluation and management of multiple myeloma. Patient was last seen by me on 10/29/2022 and complained of mild fatigue and skin rashes through out her body. She was otherwise doing well overall.  Today, she complains of skin issues since starting treatment. She also complains of a stomach ache, bloating, loss of appetite, and diarrhea. She also reports white fungus in her stool.  She endorses itchiness and reports that her rashes had stopped then restarted recently due to her medication. She continues to use topical cream to improve her rashes. She denies any abdominal pain, leg swelling, new bone pain. She reports that she does not consume plenty of meat and does not consume any refined sugar.  MEDICAL HISTORY:  Past Medical History:  Diagnosis Date   Headache    Low back pain    SBO (small bowel obstruction) ([place]) 2010   Smoldering multiple myeloma   Previous history of hypothyroidism- not currently medications Anxiety Obesity .There is no height or weight on file to calculate BMI. 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 Lumbosacral radiculopathy Left hip pain  SURGICAL HISTORY: Past Surgical History:  Procedure Laterality Date   ABDOMINAL HYSTERECTOMY     complete   BACK SURGERY     lower at [facility]   COLONOSCOPY WITH PROPOFOL N/A 08/23/2020   Procedure: COLONOSCOPY WITH PROPOFOL;  Surgeon: Harvel Quale, MD;  Location: [facility];  Service: Gastroenterology;  Laterality: N/A;  900   colonscopy  2014   GASTRIC BYPASS  yrs ago    HERNIA REPAIR     IR IMAGING GUIDED PORT INSERTION  04/20/2019   KNEE SURGERY  06/04/2009   both knees replaced    POLYPECTOMY  08/23/2020   Procedure: POLYPECTOMY;  Surgeon: Harvel Quale, MD;  Location: [facility];  Service: Gastroenterology;;   TONSILLECTOMY  age 17   TOTAL HIP ARTHROPLASTY Left 08/16/2017   Procedure: LEFT TOTAL HIP ARTHROPLASTY ANTERIOR APPROACH;  Surgeon: Dorna Leitz, MD;  Location: [facility];  Service: Orthopedics;  Laterality: Left;  Spinal surgery April 2017  SOCIAL HISTORY: Social History   Socioeconomic History   Marital status: Married    Spouse name: Not on file   Number of children: Not on file   Years of education: Not on file   Highest education level: Not on file  Occupational History   Not on file  Tobacco Use   Smoking status: Former    Packs/day: 0.50    Years: 10.00    Total pack years: 5.00    Types: Cigarettes   Smokeless tobacco: Never   Tobacco comments:    quit 1977  Vaping Use   Vaping Use: Never used  Substance and Sexual Activity   Alcohol use: Yes    Comment: occ   Drug use: No   Sexual activity: Not on file  Other Topics Concern   Not on file  Social History Narrative   Not on file   Social Determinants of Health   Financial Resource Strain: Not on file  Food Insecurity: Not on file  Transportation Needs: Not on file  Physical Activity: Not on file  Stress: Not on file  Social Connections: Not on file  Intimate Partner Violence: Not At Risk (03/22/2021)   Humiliation, Afraid, Rape, and Kick questionnaire    Fear of Current or Ex-Partner: No    Emotionally Abused: No    Physically Abused: No    Sexually Abused: No  Occasional alcohol use Former smoker and smoked 1 pack per week for about 9 years has since quit.  FAMILY HISTORY: Family History  Problem Relation Age of Onset   Breast cancer Neg Hx     ALLERGIES:  is allergic to codeine.  MEDICATIONS:  Current Outpatient Medications  Medication  Sig Dispense Refill   acyclovir (ZOVIRAX) 800 MG tablet Take 0.5 tablets (400 mg total) by mouth 2 (two) times daily. 60 tablet 11   Calcium Carbonate-Vitamin D 600-200 MG-UNIT TABS Calcium 600 with Vitamin D3 600 mg-5 mcg (200 unit) tablet  Take 1 tablet every day by oral route as directed.     Cholecalciferol 1.25 MG (50000 UT) TABS Take by mouth.     fluconazole (DIFLUCAN) 100 MG tablet Take 1 tablet (100 mg total) by mouth daily. 14 tablet 0   FOLIC ACID PO Take 1,601 mg by mouth daily.      lidocaine-prilocaine (EMLA) cream Apply 1 application. topically as needed. 30 g 2   NYSTATIN powder Apply topically 2 (two) times daily. (Patient not taking: Reported on 12/27/2021) 15 g 1   Probiotic Product (PROBIOTIC DAILY PO) Take 2 capsules by mouth daily. Gummies     vitamin B-12 (CYANOCOBALAMIN) 1000 MCG tablet Take 1,000 mcg by mouth daily.      Vitamin D, Ergocalciferol, (DRISDOL) 1.25 MG (50000 UNIT) CAPS capsule TAKE 1 CAPSULE ONCE A WEEK (Patient taking differently: Take 50,000 Units by mouth every 7 (seven) days. Sunday) 13 capsule 3   No current facility-administered medications for this visit.   Facility-Administered Medications Ordered in Other Visits  Medication Dose Route Frequency Provider Last Rate Last Admin   sodium chloride flush (NS) 0.9 % injection 10 mL  10 mL Intracatheter PRN Brunetta Genera, MD   10 mL at 05/11/19 1437    REVIEW OF SYSTEMS:   10 Point review of Systems was done is negative except as noted above.   PHYSICAL EXAMINATION: .BP (!) 150/82   Pulse 72   Temp 97.9 F (36.6 C)   Resp 18   Wt 211 lb 4.8 oz (95.8 kg)   SpO2 100%   BMI 36.27 kg/m  GENERAL:alert, in no acute distress and comfortable SKIN: no acute rashes, no significant lesions EYES: conjunctiva are pink and non-injected, sclera anicteric OROPHARYNX: MMM, no exudates, no oropharyngeal erythema or ulceration NECK: supple, no JVD LYMPH:  no palpable lymphadenopathy in the cervical,  axillary or inguinal regions LUNGS: clear to auscultation b/l with normal respiratory effort HEART: regular rate & rhythm ABDOMEN:  normoactive bowel sounds , non tender, not distended. Extremity: no pedal edema PSYCH: alert & oriented x 3 with fluent speech NEURO: no focal motor/sensory deficits    LABORATORY DATA:  I have reviewed the data as listed  .    Latest Ref Rng & Units 12/19/2022    9:27 AM 10/22/2022   10:39 AM 08/20/2022    8:53 AM  CBC  WBC 4.0 - 10.5 K/uL 3.4  4.3  3.8   Hemoglobin 12.0 - 15.0 g/dL 11.6  11.3  11.8   Hematocrit 36.0 - 46.0 % 34.0  32.9  35.2  Platelets 150 - 400 K/uL 144  173  160    CBC    Component Value Date/Time   WBC 3.4 (L) 12/19/2022 0927   WBC 3.3 (L) 10/04/2021 0902   RBC 3.53 (L) 12/19/2022 0927   HGB 11.6 (L) 12/19/2022 0927   HGB 11.3 (L) 09/30/2017 1145   HCT 34.0 (L) 12/19/2022 0927   HCT 35.1 09/30/2017 1145   PLT 144 (L) 12/19/2022 0927   PLT 172 09/30/2017 1145   MCV 96.3 12/19/2022 0927   MCV 97.5 09/30/2017 1145   MCH 32.9 12/19/2022 0927   MCHC 34.1 12/19/2022 0927   RDW 12.0 12/19/2022 0927   RDW 14.0 09/30/2017 1145   LYMPHSABS 1.7 12/19/2022 0927   LYMPHSABS 1.3 09/30/2017 1145   MONOABS 0.3 12/19/2022 0927   MONOABS 0.2 09/30/2017 1145   EOSABS 0.0 12/19/2022 0927   EOSABS 0.0 09/30/2017 1145   EOSABS 0.1 06/03/2014 1003   BASOSABS 0.0 12/19/2022 0927   BASOSABS 0.0 09/30/2017 1145     .    Latest Ref Rng & Units 12/19/2022    9:27 AM 10/22/2022   10:39 AM 08/20/2022    8:53 AM  CMP  Glucose 70 - 99 mg/dL 106  82  82   BUN 8 - 23 mg/dL 13  10  12    Creatinine 0.44 - 1.00 mg/dL 0.74  0.79  0.80   Sodium 135 - 145 mmol/L 140  139  140   Potassium 3.5 - 5.1 mmol/L 3.5  3.8  3.9   Chloride 98 - 111 mmol/L 110  109  109   CO2 22 - 32 mmol/L 24  25  24    Calcium 8.9 - 10.3 mg/dL 9.0  9.0  9.0   Total Protein 6.5 - 8.1 g/dL 6.5  6.3  6.8   Total Bilirubin 0.3 - 1.2 mg/dL 0.8  0.6  0.8   Alkaline Phos 38 -  126 U/L 59  58  51   AST 15 - 41 U/L 21  29  23    ALT 0 - 44 U/L 22  31  25     03/11/19 BM Bx:   03/11/19 Cytogenetics:            RADIOGRAPHIC STUDIES: I have personally reviewed the radiological images as listed and agreed with the findings in the report. No results found.   Bone survey 11/08/2016 IMPRESSION: 1. Small lytic lesion in the right scapula. 2. Possible small lytic lesion in the left scapula. 3. Postoperative changes. 4. Significant degenerative changes in both hips, left greater than right. 5. No evidence for acute fracture     Electronically Signed   By: Nolon Nations M.D.   On: 11/08/2016 16:19   ASSESSMENT & PLAN:   72 y.o. very pleasant lady with history of   1) R-ISS 3 light chain multiple myeloma (concern for small lytic lesions in left and right scapulae) - (appears light chain producing) and Progressive anemia This was apparently diagnosed as smoldering myeloma in 2011 by her hematologist in [place]. No renal failure/hypercalcemia. Bone survey with concern for possible small lytic lesions in B/L scapulae. PET/CT scan on 03/12/2017 showed no concerning bone lesions. Initial Bm Bx with 17% kappa restricted plasma cells consistent with plasma cell neoplasm. No treatment so far. 03/11/19 BM Bx revealed involvement by 50% plasma cells; genetics -high risk t(14;16), previous genetics from April 2018 BM Bx were standard risk 03/16/19 MRI Lumbar reveals several explanations for her lower back pain including  L2-L3 stenosis, but reassuringly, there was not evidence of involvement by multiple myeloma 04/01/19 PET/CT which revealed no FDG evidence of active multiple myeloma within the skeleton. No evidence of lytic lesions within the skeleton or soft tissue Plasmacytoma. 05/07/2019 DXA scan revealed osteopenia, T-score of -1.2.  2) Chronic leukopenia - ? Related to SMM vs other nutritional deficiencies (h/o gastric bypass surgery put her at risk for  nutritional deficiencies).  WBC counts of have been close to normal recently today at 3.3k with an ANC of 1200. B12 levels WNL Ferritin adequate ?additional factor -recent NSAID use. ?element of benign ethnic neutropenia. Has not had any issues with frequent infection.   3) Neuropathy/radiculopathy -Continue follow up with orthopedics  4) history of superficial Venous Thrombosis of the left small saphenous vein  06/24/2019 US venous left : Right: There is no evidence of deep vein thrombosis in the lower extremity. No cystic structure found in the popliteal fossa. Left: Findings consistent with acute superficial vein thrombosis involving the left small saphenous vein. There is no evidence of deep vein thrombosis in the lower extremity.   PLAN: -Patient notes no acute new clinical concerns suggestive of myeloma progression at this time. -Patient has no evidence of myeloma progression at this time and continues to desire staying off maintenance treatments at this time. -Discussed lab results on 12/19/2022 with patient. CBC showed WBC of 3.4K, hemoglobin of 11.6, and platelets of 144K. -recommended patient to continue to use topical cream -Patient has no lab or clinical evidence of myeloma progression at this time. -CMP stable -stable mild anemia -stable mild chronic Neutropenia  -iron saturation 30%, stable and ferritin is 65 -patient will have a Mammogram on 01/14/2023 -myeloma lab revealed no M spike  -kappa lambda light chains normal -Recommended patient to consume vegetarian-based foods such as seitan, tofu, legumes, and lentils -glucose level 106 -patient has not yet been able to be seen by a dermatologist in regards to likely atopic dermatitis or biopsy for skin rashes -repeat lab 3 months  FOLLOW-UP: Portflush and labs in 10 weeks RTC with Dr Candise Che in 12 weeks  The total time spent in the appointment was 30 minutes* .  All of the patient's questions were answered with apparent  satisfaction. The patient knows to call the clinic with any problems, questions or concerns.   Wyvonnia Lora MD MS AAHIVMS [facility] Hematology/Oncology Physician [facility]  .*Total Encounter Time as defined by the Centers for Medicare and Medicaid Services includes, in addition to the face-to-face time of a patient visit (documented in the note above) non-face-to-face time: obtaining and reviewing outside history, ordering and reviewing medications, tests or procedures, care coordination (communications with other health care professionals or caregivers) and documentation in the medical record.   I,Mitra Faeizi,acting as a Neurosurgeon for Wyvonnia Lora, MD.,have documented all relevant documentation on the behalf of Wyvonnia Lora, MD,as directed by  Wyvonnia Lora, MD while in the presence of Wyvonnia Lora, MD.  .I have reviewed the above documentation for accuracy and completeness, and I agree with the above. Johney Maine MD

## 2023-01-01 ENCOUNTER — Encounter: Payer: Self-pay | Admitting: Hematology

## 2023-01-14 ENCOUNTER — Ambulatory Visit
Admission: RE | Admit: 2023-01-14 | Discharge: 2023-01-14 | Disposition: A | Discharge status: Home / Self Care | Payer: Medicare Other | Source: Ambulatory Visit | Attending: Internal Medicine | Admitting: Internal Medicine

## 2023-01-14 DIAGNOSIS — Z1231 Encounter for screening mammogram for malignant neoplasm of breast: Secondary | ICD-10-CM

## 2023-01-15 ENCOUNTER — Other Ambulatory Visit: Payer: Self-pay

## 2023-03-04 DIAGNOSIS — E039 Hypothyroidism, unspecified: Secondary | ICD-10-CM | POA: Diagnosis not present

## 2023-03-04 DIAGNOSIS — E782 Mixed hyperlipidemia: Secondary | ICD-10-CM | POA: Diagnosis not present

## 2023-03-04 DIAGNOSIS — D649 Anemia, unspecified: Secondary | ICD-10-CM | POA: Diagnosis not present

## 2023-03-04 DIAGNOSIS — R030 Elevated blood-pressure reading, without diagnosis of hypertension: Secondary | ICD-10-CM | POA: Diagnosis not present

## 2023-03-06 ENCOUNTER — Inpatient Hospital Stay: Payer: Medicare Other | Attending: Hematology

## 2023-03-06 DIAGNOSIS — C9001 Multiple myeloma in remission: Secondary | ICD-10-CM

## 2023-03-06 DIAGNOSIS — C9000 Multiple myeloma not having achieved remission: Secondary | ICD-10-CM | POA: Diagnosis not present

## 2023-03-06 DIAGNOSIS — Z95828 Presence of other vascular implants and grafts: Secondary | ICD-10-CM

## 2023-03-06 DIAGNOSIS — Z452 Encounter for adjustment and management of vascular access device: Secondary | ICD-10-CM | POA: Diagnosis not present

## 2023-03-06 LAB — CBC WITH DIFFERENTIAL (CANCER CENTER ONLY)
Abs Immature Granulocytes: 0.01 10*3/uL (ref 0.00–0.07)
Basophils Absolute: 0 10*3/uL (ref 0.0–0.1)
Basophils Relative: 1 %
Eosinophils Absolute: 0.1 10*3/uL (ref 0.0–0.5)
Eosinophils Relative: 1 %
HCT: 37.1 % (ref 36.0–46.0)
Hemoglobin: 12.4 g/dL (ref 12.0–15.0)
Immature Granulocytes: 0 %
Lymphocytes Relative: 47 %
Lymphs Abs: 2.1 10*3/uL (ref 0.7–4.0)
MCH: 31.9 pg (ref 26.0–34.0)
MCHC: 33.4 g/dL (ref 30.0–36.0)
MCV: 95.4 fL (ref 80.0–100.0)
Monocytes Absolute: 0.4 10*3/uL (ref 0.1–1.0)
Monocytes Relative: 9 %
Neutro Abs: 1.8 10*3/uL (ref 1.7–7.7)
Neutrophils Relative %: 42 %
Platelet Count: 165 10*3/uL (ref 150–400)
RBC: 3.89 MIL/uL (ref 3.87–5.11)
RDW: 11.5 % (ref 11.5–15.5)
WBC Count: 4.3 10*3/uL (ref 4.0–10.5)
nRBC: 0 % (ref 0.0–0.2)

## 2023-03-06 LAB — CMP (CANCER CENTER ONLY)
ALT: 29 U/L (ref 0–44)
AST: 26 U/L (ref 15–41)
Albumin: 4.1 g/dL (ref 3.5–5.0)
Alkaline Phosphatase: 71 U/L (ref 38–126)
Anion gap: 6 (ref 5–15)
BUN: 11 mg/dL (ref 8–23)
CO2: 25 mmol/L (ref 22–32)
Calcium: 8.9 mg/dL (ref 8.9–10.3)
Chloride: 108 mmol/L (ref 98–111)
Creatinine: 0.77 mg/dL (ref 0.44–1.00)
GFR, Estimated: >60 mL/min (ref >60)
Glucose, Bld: 87 mg/dL (ref 70–99)
Potassium: 4 mmol/L (ref 3.5–5.1)
Sodium: 139 mmol/L (ref 135–145)
Total Bilirubin: 1 mg/dL (ref 0.3–1.2)
Total Protein: 6.7 g/dL (ref 6.5–8.1)

## 2023-03-06 MED ORDER — SODIUM CHLORIDE 0.9% FLUSH
10.0000 mL | Freq: Once | INTRAVENOUS | Status: AC
  Administered 2023-03-06: 10 mL

## 2023-03-06 MED ORDER — HEPARIN SOD (PORK) LOCK FLUSH 100 UNIT/ML IV SOLN
500.0000 [IU] | Freq: Once | INTRAVENOUS | Status: AC
  Administered 2023-03-06: 500 [IU]

## 2023-03-07 LAB — KAPPA/LAMBDA LIGHT CHAINS
Kappa free light chain: 13.6 mg/L (ref 3.3–19.4)
Kappa, lambda light chain ratio: 0.97 (ref 0.26–1.65)
Lambda free light chains: 14 mg/L (ref 5.7–26.3)

## 2023-03-13 DIAGNOSIS — E669 Obesity, unspecified: Secondary | ICD-10-CM | POA: Diagnosis not present

## 2023-03-13 DIAGNOSIS — D649 Anemia, unspecified: Secondary | ICD-10-CM | POA: Diagnosis not present

## 2023-03-13 DIAGNOSIS — E039 Hypothyroidism, unspecified: Secondary | ICD-10-CM | POA: Diagnosis not present

## 2023-03-13 DIAGNOSIS — Z96642 Presence of left artificial hip joint: Secondary | ICD-10-CM | POA: Diagnosis not present

## 2023-03-13 DIAGNOSIS — C9001 Multiple myeloma in remission: Secondary | ICD-10-CM | POA: Diagnosis not present

## 2023-03-13 DIAGNOSIS — E782 Mixed hyperlipidemia: Secondary | ICD-10-CM | POA: Diagnosis not present

## 2023-03-13 DIAGNOSIS — Z6839 Body mass index (BMI) 39.0-39.9, adult: Secondary | ICD-10-CM | POA: Diagnosis not present

## 2023-03-15 LAB — MULTIPLE MYELOMA PANEL, SERUM
Albumin SerPl Elph-Mcnc: 3.8 g/dL (ref 2.9–4.4)
Albumin/Glob SerPl: 1.5 (ref 0.7–1.7)
Alpha 1: 0.2 g/dL (ref 0.0–0.4)
Alpha2 Glob SerPl Elph-Mcnc: 0.5 g/dL (ref 0.4–1.0)
B-Globulin SerPl Elph-Mcnc: 1 g/dL (ref 0.7–1.3)
Gamma Glob SerPl Elph-Mcnc: 0.9 g/dL (ref 0.4–1.8)
Globulin, Total: 2.7 g/dL (ref 2.2–3.9)
IgA: 95 mg/dL (ref 87–352)
IgG (Immunoglobin G), Serum: 1041 mg/dL (ref 586–1602)
IgM (Immunoglobulin M), Srm: 32 mg/dL (ref 26–217)
Total Protein ELP: 6.5 g/dL (ref 6.0–8.5)

## 2023-03-19 ENCOUNTER — Ambulatory Visit: Payer: Medicare Other | Admitting: Dermatology

## 2023-03-19 ENCOUNTER — Encounter: Payer: Self-pay | Admitting: Dermatology

## 2023-03-19 DIAGNOSIS — L308 Other specified dermatitis: Secondary | ICD-10-CM

## 2023-03-19 DIAGNOSIS — L209 Atopic dermatitis, unspecified: Secondary | ICD-10-CM | POA: Diagnosis not present

## 2023-03-19 MED ORDER — CLOBETASOL PROPIONATE 0.05 % EX CREA
1.0000 | TOPICAL_CREAM | Freq: Two times a day (BID) | CUTANEOUS | 3 refills | Status: DC
Start: 2023-03-19 — End: 2024-03-03

## 2023-03-19 MED ORDER — TACROLIMUS 0.1 % EX OINT
TOPICAL_OINTMENT | Freq: Two times a day (BID) | CUTANEOUS | 3 refills | Status: DC
Start: 2023-03-19 — End: 2024-02-10

## 2023-03-19 NOTE — Progress Notes (Unsigned)
   New Patient Visit   Subjective  Madison Parker is a 72 y.o. female who presents for the following: Atopic Dermatitis   Patient states she has itchy, bumpy areas located at the chest, arms, and back that she would like to have examined. Patient reports the areas have been there for  6-7  month(s). She reports the areas are bothersome. Areas are itchy,bumpy, red and dry.She  states that the areas have spread. Patient reports she has previously been treated for these areas. Patient denies Hx of bx. Patient denies family history of skin cancer(s).    The following portions of the chart were reviewed this encounter and updated as appropriate: medications, allergies, medical history  Review of Systems:  No other skin or systemic complaints except as noted in HPI or Assessment and Plan.  Objective  Well appearing patient in no apparent distress; mood and affect are within normal limits.  A full examination was performed including scalp, head, eyes, ears, nose, lips, neck, chest, axillae, abdomen, back, buttocks, bilateral upper extremities, bilateral lower extremities, hands, feet, fingers, toes, fingernails, and toenails. All findings within normal limits unless otherwise noted below.   Relevant exam findings are noted in the Assessment and Plan.       Assessment & Plan   ATOPIC DERMATITIS Exam: Scaly pink papules coalescing to plaques ***% BSA  Not at Goal  Atopic dermatitis (eczema) is a chronic, relapsing, pruritic condition that can significantly affect quality of life. It is often associated with allergic rhinitis and/or asthma and can require treatment with topical medications, phototherapy, or in severe cases biologic injectable medication (Dupixent; Adbry) or Oral JAK inhibitors.  Treatment Plan: -Patient educated on fragrance free things that will be used -Cerave Anti-itch Lotion -Clobetasol cream BID use for 2 weeks the STOP -Tacrolimus BID when not using the  Clobetasol -Continue taking antihistamine to help with itching -If all options fail we will consider Dupixent Injectable, Patient provided with brochure  Recommend gentle skin care.   Other eczema  Related Medications clobetasol cream (TEMOVATE) 0.05 % Apply 1 Application topically 2 (two) times daily. USE FOR 2 WEEKS then STOP  tacrolimus (PROTOPIC) 0.1 % ointment Apply topically 2 (two) times daily.    Return in about 2 months (around 05/19/2023) for Eczema F/U.    Documentation: I have reviewed the above documentation for accuracy and completeness, and I agree with the above.  Stasia Cavalier, am acting as scribe for Langston Reusing, DO.  Langston Reusing, DO

## 2023-03-19 NOTE — Patient Instructions (Addendum)
Due to recent changes in healthcare laws, you may see results of your pathology and/or laboratory studies on MyChart before the doctors have had a chance to review them. We understand that in some cases there may be results that are confusing or concerning to you. Please understand that not all results are received at the same time and often the doctors may need to interpret multiple results in order to provide you with the best plan of care or course of treatment. Therefore, we ask that you please give us 2 business days to thoroughly review all your results before contacting the office for clarification. Should we see a critical lab result, you will be contacted sooner.   If You Need Anything After Your Visit  If you have any questions or concerns for your doctor, please call our main line at [phone] If no one answers, please leave a voicemail as directed and we will return your call as soon as possible. Messages left after 4 pm will be answered the following business day.   You may also send us a message via MyChart. We typically respond to MyChart messages within 1-2 business days.  For prescription refills, please ask your pharmacy to contact our office. Our fax number is [phone].  If you have an urgent issue when the clinic is closed that cannot wait until the next business day, you can page your doctor at the number below.    Please note that while we do our best to be available for urgent issues outside of office hours, we are not available 24/7.   If you have an urgent issue and are unable to reach us, you may choose to seek medical care at your doctor's office, retail clinic, urgent care center, or emergency room.  If you have a medical emergency, please immediately call 911 or go to the emergency department. In the event of inclement weather, please call our main line at [phone] for an update on the status of any delays or closures.  Dermatology Medication Tips: Please  keep the boxes that topical medications come in in order to help keep track of the instructions about where and how to use these. Pharmacies typically print the medication instructions only on the boxes and not directly on the medication tubes.   If your medication is too expensive, please contact our office at [phone] or send us a message through MyChart.   We are unable to tell what your co-pay for medications will be in advance as this is different depending on your insurance coverage. However, we may be able to find a substitute medication at lower cost or fill out paperwork to get insurance to cover a needed medication.   If a prior authorization is required to get your medication covered by your insurance company, please allow us 1-2 business days to complete this process.  Drug prices often vary depending on where the prescription is filled and some pharmacies may offer cheaper prices.  The website www.goodrx.com contains coupons for medications through different pharmacies. The prices here do not account for what the cost may be with help from insurance (it may be cheaper with your insurance), but the website can give you the price if you did not use any insurance.  - You can print the associated coupon and take it with your prescription to the pharmacy.  - You may also stop by our office during regular business hours and pick up a GoodRx coupon card.  - If you need your   prescription sent electronically to a different pharmacy, notify our office through [place] MyChart or by phone at [phone]     

## 2023-03-20 ENCOUNTER — Inpatient Hospital Stay: Payer: Medicare Other | Attending: Hematology | Admitting: Hematology

## 2023-03-20 ENCOUNTER — Other Ambulatory Visit: Payer: Self-pay

## 2023-03-20 DIAGNOSIS — C9000 Multiple myeloma not having achieved remission: Secondary | ICD-10-CM | POA: Diagnosis not present

## 2023-03-20 NOTE — Progress Notes (Signed)
HEMATOLOGY/ONCOLOGY PHONE VISIT NOTE  Date of Service: 03/20/2023   PCP: Patriciaann Clan MD  CHIEF COMPLAINTS/PURPOSE OF CONSULTATION:  Follow-up for continued evaluation and management of multiple myeloma  HISTORY OF PRESENTING ILLNESS:  See previous note for details on initial presentation  INTERVAL HISTORY:  Madison Parker is a 72 y.o. female who is here to continue evaluation and management of multiple myeloma. Patient was last seen by me on 12/26/2022 and complained of itchy rashes, stomach ache, bloating, loss of appetite, diarrhea, and white fungus in the stool.    I connected with Cecile Sheerer on 03/20/23 at  9:30 AM EDT by telephone visit and verified that I am speaking with the correct person using two identifiers.   I discussed the limitations, risks, security and privacy concerns of performing an evaluation and management service by telemedicine and the availability of in-person appointments. I also discussed with the patient that there may be a patient responsible charge related to this service. The patient expressed understanding and agreed to proceed.   Other persons participating in the visit and their role in the encounter: none   Patient's location: home  Provider's location: [facility]   Chief Complaint: Follow-up for continued evaluation and management of multiple myeloma  Today, she reports that she has felt well overall since her last visit. She reports stable energy levels. She continues to endorse a rash and was seen by dermatology. She was told that she has atopic dermatitis. She will try itch-relief moisturizer lotion as well as topical cortisone for two weeks. She reports that she has been consuming peanut butter and which may be causing allergic reactions. Patient recently changed her laundry detergent. She is planning on traveling to [place] soon.  MEDICAL HISTORY:  Past Medical History:  Diagnosis Date   Headache    Low back pain    SBO (small bowel  obstruction) (HCC) 2010   Smoldering multiple myeloma   Previous history of hypothyroidism- not currently medications Anxiety Obesity .There is no height or weight on file to calculate BMI. 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 Lumbosacral radiculopathy Left hip pain  SURGICAL HISTORY: Past Surgical History:  Procedure Laterality Date   ABDOMINAL HYSTERECTOMY     complete   BACK SURGERY     lower at [facility]   COLONOSCOPY WITH PROPOFOL N/A 08/23/2020   Procedure: COLONOSCOPY WITH PROPOFOL;  Surgeon: Dolores Frame, MD;  Location: [facility];  Service: Gastroenterology;  Laterality: N/A;  900   colonscopy  2014   GASTRIC BYPASS  yrs ago   HERNIA REPAIR     IR IMAGING GUIDED PORT INSERTION  04/20/2019   KNEE SURGERY  06/04/2009   both knees replaced    POLYPECTOMY  08/23/2020   Procedure: POLYPECTOMY;  Surgeon: Dolores Frame, MD;  Location: [facility];  Service: Gastroenterology;;   TONSILLECTOMY  age 16   TOTAL HIP ARTHROPLASTY Left 08/16/2017   Procedure: LEFT TOTAL HIP ARTHROPLASTY ANTERIOR APPROACH;  Surgeon: Jodi Geralds, MD;  Location: [facility];  Service: Orthopedics;  Laterality: Left;  Spinal surgery April 2017  SOCIAL HISTORY: Social History   Socioeconomic History   Marital status: Married    Spouse name: Not on file   Number of children: Not on file   Years of education: Not on file   Highest education level: Not on file  Occupational History   Not on file  Tobacco Use   Smoking status: Former  Packs/day: 0.50    Years: 10.00    Additional pack years: 0.00    Total pack years: 5.00    Types: Cigarettes   Smokeless tobacco: Never   Tobacco comments:    quit 1977  Vaping Use   Vaping Use: Never used  Substance and Sexual Activity   Alcohol use: Yes    Comment: occ   Drug use: No   Sexual activity: Not on file  Other Topics Concern   Not on file  Social History  Narrative   Not on file   Social Determinants of Health   Financial Resource Strain: Not on file  Food Insecurity: Not on file  Transportation Needs: Not on file  Physical Activity: Not on file  Stress: Not on file  Social Connections: Not on file  Intimate Partner Violence: Not At Risk (03/22/2021)   Humiliation, Afraid, Rape, and Kick questionnaire    Fear of Current or Ex-Partner: No    Emotionally Abused: No    Physically Abused: No    Sexually Abused: No  Occasional alcohol use Former smoker and smoked 1 pack per week for about 9 years has since quit.  FAMILY HISTORY: Family History  Problem Relation Age of Onset   Breast cancer Neg Hx     ALLERGIES:  is allergic to codeine.  MEDICATIONS:  Current Outpatient Medications  Medication Sig Dispense Refill   acyclovir (ZOVIRAX) 800 MG tablet Take 0.5 tablets (400 mg total) by mouth 2 (two) times daily. 60 tablet 11   Calcium Carbonate-Vitamin D 600-200 MG-UNIT TABS Calcium 600 with Vitamin D3 600 mg-5 mcg (200 unit) tablet  Take 1 tablet every day by oral route as directed.     Cholecalciferol 1.25 MG (50000 UT) TABS Take by mouth.     clobetasol cream (TEMOVATE) 0.05 % Apply 1 Application topically 2 (two) times daily. USE FOR 2 WEEKS then STOP 45 g 3   fluconazole (DIFLUCAN) 100 MG tablet Take 1 tablet (100 mg total) by mouth daily. 14 tablet 0   fluticasone (CUTIVATE) 0.05 % cream Apply 1 Application topically daily.     FOLIC ACID PO Take 1,360 mg by mouth daily.      lidocaine-prilocaine (EMLA) cream Apply 1 application. topically as needed. 30 g 2   NYSTATIN powder Apply topically 2 (two) times daily. 15 g 1   nystatin-triamcinolone (MYCOLOG II) cream Apply 1 Application topically 2 (two) times daily.     Probiotic Product (PROBIOTIC DAILY PO) Take 2 capsules by mouth daily. Gummies     tacrolimus (PROTOPIC) 0.1 % ointment Apply topically 2 (two) times daily. 100 g 3   vitamin B-12 (CYANOCOBALAMIN) 1000 MCG tablet Take  1,000 mcg by mouth daily.      Vitamin D, Ergocalciferol, (DRISDOL) 1.25 MG (50000 UNIT) CAPS capsule TAKE 1 CAPSULE ONCE A WEEK (Patient taking differently: Take 50,000 Units by mouth every 7 (seven) days. Sunday) 13 capsule 3   No current facility-administered medications for this visit.   Facility-Administered Medications Ordered in Other Visits  Medication Dose Route Frequency Provider Last Rate Last Admin   sodium chloride flush (NS) 0.9 % injection 10 mL  10 mL Intracatheter PRN Johney Maine, MD   10 mL at 05/11/19 1437    REVIEW OF SYSTEMS:   10 Point review of Systems was done is negative except as noted above.   PHYSICAL EXAMINATION: TELEMEDICINE VISIT .There were no vitals taken for this visit.   LABORATORY DATA:  I have reviewed  the data as listed  .    Latest Ref Rng & Units 03/06/2023   10:24 AM 12/19/2022    9:27 AM 10/22/2022   10:39 AM  CBC  WBC 4.0 - 10.5 K/uL 4.3  3.4  4.3   Hemoglobin 12.0 - 15.0 g/dL 16.1  09.6  04.5   Hematocrit 36.0 - 46.0 % 37.1  34.0  32.9   Platelets 150 - 400 K/uL 165  144  173    CBC    Component Value Date/Time   WBC 4.3 03/06/2023 1024   WBC 3.3 (L) 10/04/2021 0902   RBC 3.89 03/06/2023 1024   HGB 12.4 03/06/2023 1024   HGB 11.3 (L) 09/30/2017 1145   HCT 37.1 03/06/2023 1024   HCT 35.1 09/30/2017 1145   PLT 165 03/06/2023 1024   PLT 172 09/30/2017 1145   MCV 95.4 03/06/2023 1024   MCV 97.5 09/30/2017 1145   MCH 31.9 03/06/2023 1024   MCHC 33.4 03/06/2023 1024   RDW 11.5 03/06/2023 1024   RDW 14.0 09/30/2017 1145   LYMPHSABS 2.1 03/06/2023 1024   LYMPHSABS 1.3 09/30/2017 1145   MONOABS 0.4 03/06/2023 1024   MONOABS 0.2 09/30/2017 1145   EOSABS 0.1 03/06/2023 1024   EOSABS 0.0 09/30/2017 1145   EOSABS 0.1 06/03/2014 1003   BASOSABS 0.0 03/06/2023 1024   BASOSABS 0.0 09/30/2017 1145     .    Latest Ref Rng & Units 03/06/2023   10:24 AM 12/19/2022    9:27 AM 10/22/2022   10:39 AM  CMP  Glucose 70 - 99 mg/dL  87  409  82   BUN 8 - 23 mg/dL 11  13  10    Creatinine 0.44 - 1.00 mg/dL 8.11  9.14  7.82   Sodium 135 - 145 mmol/L 139  140  139   Potassium 3.5 - 5.1 mmol/L 4.0  3.5  3.8   Chloride 98 - 111 mmol/L 108  110  109   CO2 22 - 32 mmol/L 25  24  25    Calcium 8.9 - 10.3 mg/dL 8.9  9.0  9.0   Total Protein 6.5 - 8.1 g/dL 6.7  6.5  6.3   Total Bilirubin 0.3 - 1.2 mg/dL 1.0  0.8  0.6   Alkaline Phos 38 - 126 U/L 71  59  58   AST 15 - 41 U/L 26  21  29    ALT 0 - 44 U/L 29  22  31     03/11/19 BM Bx:   03/11/19 Cytogenetics:            RADIOGRAPHIC STUDIES: I have personally reviewed the radiological images as listed and agreed with the findings in the report. No results found.   Bone survey 11/08/2016 IMPRESSION: 1. Small lytic lesion in the right scapula. 2. Possible small lytic lesion in the left scapula. 3. Postoperative changes. 4. Significant degenerative changes in both hips, left greater than right. 5. No evidence for acute fracture     Electronically Signed   By: Norva Pavlov M.D.   On: 11/08/2016 16:19   ASSESSMENT & PLAN:   72 y.o. very pleasant lady with history of   1) R-ISS 3 light chain multiple myeloma (concern for small lytic lesions in left and right scapulae) - (appears light chain producing) and Progressive anemia This was apparently diagnosed as smoldering myeloma in 2011 by her hematologist in [place]. No renal failure/hypercalcemia. Bone survey with concern for possible small lytic lesions in B/L  scapulae. PET/CT scan on 03/12/2017 showed no concerning bone lesions. Initial Bm Bx with 17% kappa restricted plasma cells consistent with plasma cell neoplasm. No treatment so far. 03/11/19 BM Bx revealed involvement by 50% plasma cells; genetics -high risk t(14;16), previous genetics from April 2018 BM Bx were standard risk 03/16/19 MRI Lumbar reveals several explanations for her lower back pain including L2-L3 stenosis, but reassuringly, there  was not evidence of involvement by multiple myeloma 04/01/19 PET/CT which revealed no FDG evidence of active multiple myeloma within the skeleton. No evidence of lytic lesions within the skeleton or soft tissue Plasmacytoma. 05/07/2019 DXA scan revealed osteopenia, T-score of -1.2.  2) Chronic leukopenia - ? Related to SMM vs other nutritional deficiencies (h/o gastric bypass surgery put her at risk for nutritional deficiencies).  WBC counts of have been close to normal recently today at 3.3k with an ANC of 1200. B12 levels WNL Ferritin adequate ?additional factor -recent NSAID use. ?element of benign ethnic neutropenia. Has not had any issues with frequent infection.   3) Neuropathy/radiculopathy -Continue follow up with orthopedics  4) history of superficial Venous Thrombosis of the left small saphenous vein  06/24/2019 US venous left : Right: There is no evidence of deep vein thrombosis in the lower extremity. No cystic structure found in the popliteal fossa. Left: Findings consistent with acute superficial vein thrombosis involving the left small saphenous vein. There is no evidence of deep vein thrombosis in the lower extremity.   PLAN:  -Discussed lab results from 03/06/2023 in detail with patient. CBC normal, showed WBC of 4.3K, hemoglobin of 12.4, and platelets of 165K. -anemia resolved -CMP normal -recent myeloma panel shows that patient continues to be in remission -Patient notes no acute new clinical concerns suggestive of myeloma progression at this time. -Patient has no lab or clinical evidence of myeloma progression at this time and continues to desire staying off maintenance treatments at this time. -informed patient that treatment may change her immune system and her skin may be reacting to an environmental factor causing an allergic reaction or inflammatory condition -Patient has a scheduled visit with dermatology in a couple of months -will continue to monitor with labs in  2-3 months  FOLLOW-UP: Portflush and labs in 10 weeks RTC with Dr Candise Che in 12 weeks  The total time spent in the appointment was 21 minutes* .  All of the patient's questions were answered with apparent satisfaction. The patient knows to call the clinic with any problems, questions or concerns.   Wyvonnia Lora MD MS AAHIVMS [facility] Hematology/Oncology Physician [facility]  .*Total Encounter Time as defined by the Centers for Medicare and Medicaid Services includes, in addition to the face-to-face time of a patient visit (documented in the note above) non-face-to-face time: obtaining and reviewing outside history, ordering and reviewing medications, tests or procedures, care coordination (communications with other health care professionals or caregivers) and documentation in the medical record.    I,Mitra Faeizi,acting as a Neurosurgeon for Wyvonnia Lora, MD.,have documented all relevant documentation on the behalf of Wyvonnia Lora, MD,as directed by  Wyvonnia Lora, MD while in the presence of Wyvonnia Lora, MD.  .I have reviewed the above documentation for accuracy and completeness, and I agree with the above. Johney Maine MD

## 2023-03-21 ENCOUNTER — Telehealth: Payer: Self-pay | Admitting: Hematology

## 2023-03-22 ENCOUNTER — Other Ambulatory Visit: Payer: Self-pay

## 2023-03-26 ENCOUNTER — Encounter: Payer: Self-pay | Admitting: Hematology

## 2023-05-22 ENCOUNTER — Ambulatory Visit: Payer: TRICARE For Life (TFL) | Admitting: Dermatology

## 2023-05-27 ENCOUNTER — Other Ambulatory Visit: Payer: Self-pay

## 2023-05-27 DIAGNOSIS — C9000 Multiple myeloma not having achieved remission: Secondary | ICD-10-CM

## 2023-05-29 ENCOUNTER — Other Ambulatory Visit: Payer: Self-pay

## 2023-05-29 ENCOUNTER — Inpatient Hospital Stay: Payer: Medicare Other | Attending: Hematology

## 2023-05-29 DIAGNOSIS — Z9884 Bariatric surgery status: Secondary | ICD-10-CM | POA: Insufficient documentation

## 2023-05-29 DIAGNOSIS — C9001 Multiple myeloma in remission: Secondary | ICD-10-CM

## 2023-05-29 DIAGNOSIS — Z95828 Presence of other vascular implants and grafts: Secondary | ICD-10-CM

## 2023-05-29 DIAGNOSIS — G629 Polyneuropathy, unspecified: Secondary | ICD-10-CM | POA: Insufficient documentation

## 2023-05-29 DIAGNOSIS — Z86718 Personal history of other venous thrombosis and embolism: Secondary | ICD-10-CM | POA: Insufficient documentation

## 2023-05-29 DIAGNOSIS — K921 Melena: Secondary | ICD-10-CM | POA: Insufficient documentation

## 2023-05-29 DIAGNOSIS — C9000 Multiple myeloma not having achieved remission: Secondary | ICD-10-CM

## 2023-05-29 LAB — CMP (CANCER CENTER ONLY)
ALT: 24 U/L (ref 0–44)
AST: 19 U/L (ref 15–41)
Albumin: 3.8 g/dL (ref 3.5–5.0)
Alkaline Phosphatase: 77 U/L (ref 38–126)
Anion gap: 4 — ABNORMAL LOW (ref 5–15)
BUN: 15 mg/dL (ref 8–23)
CO2: 25 mmol/L (ref 22–32)
Calcium: 8.5 mg/dL — ABNORMAL LOW (ref 8.9–10.3)
Chloride: 111 mmol/L (ref 98–111)
Creatinine: 0.72 mg/dL (ref 0.44–1.00)
GFR, Estimated: >60 mL/min (ref >60)
Glucose, Bld: 86 mg/dL (ref 70–99)
Potassium: 3.8 mmol/L (ref 3.5–5.1)
Sodium: 140 mmol/L (ref 135–145)
Total Bilirubin: 0.7 mg/dL (ref 0.3–1.2)
Total Protein: 6.6 g/dL (ref 6.5–8.1)

## 2023-05-29 LAB — CBC WITH DIFFERENTIAL (CANCER CENTER ONLY)
Abs Immature Granulocytes: 0.01 10*3/uL (ref 0.00–0.07)
Basophils Absolute: 0 10*3/uL (ref 0.0–0.1)
Basophils Relative: 0 %
Eosinophils Absolute: 0 10*3/uL (ref 0.0–0.5)
Eosinophils Relative: 1 %
HCT: 33.9 % — ABNORMAL LOW (ref 36.0–46.0)
Hemoglobin: 11.3 g/dL — ABNORMAL LOW (ref 12.0–15.0)
Immature Granulocytes: 0 %
Lymphocytes Relative: 43 %
Lymphs Abs: 2.1 10*3/uL (ref 0.7–4.0)
MCH: 31.7 pg (ref 26.0–34.0)
MCHC: 33.3 g/dL (ref 30.0–36.0)
MCV: 95.2 fL (ref 80.0–100.0)
Monocytes Absolute: 0.4 10*3/uL (ref 0.1–1.0)
Monocytes Relative: 7 %
Neutro Abs: 2.4 10*3/uL (ref 1.7–7.7)
Neutrophils Relative %: 49 %
Platelet Count: 172 10*3/uL (ref 150–400)
RBC: 3.56 MIL/uL — ABNORMAL LOW (ref 3.87–5.11)
RDW: 11.7 % (ref 11.5–15.5)
WBC Count: 5 10*3/uL (ref 4.0–10.5)
nRBC: 0 % (ref 0.0–0.2)

## 2023-05-29 LAB — IRON AND IRON BINDING CAPACITY (CC-WL,HP ONLY)
Iron: 76 ug/dL (ref 28–170)
Saturation Ratios: 24 % (ref 10.4–31.8)
TIBC: 319 ug/dL (ref 250–450)
UIBC: 243 ug/dL (ref 148–442)

## 2023-05-29 LAB — FERRITIN: Ferritin: 71 ng/mL (ref 11–307)

## 2023-05-29 MED ORDER — HEPARIN SOD (PORK) LOCK FLUSH 100 UNIT/ML IV SOLN
500.0000 [IU] | Freq: Once | INTRAVENOUS | Status: AC
  Administered 2023-05-29: 500 [IU]

## 2023-05-29 MED ORDER — SODIUM CHLORIDE 0.9% FLUSH
10.0000 mL | Freq: Once | INTRAVENOUS | Status: AC
  Administered 2023-05-29: 10 mL

## 2023-05-30 LAB — KAPPA/LAMBDA LIGHT CHAINS
Kappa free light chain: 12.7 mg/L (ref 3.3–19.4)
Kappa, lambda light chain ratio: 1.07 (ref 0.26–1.65)
Lambda free light chains: 11.9 mg/L (ref 5.7–26.3)

## 2023-06-02 LAB — MULTIPLE MYELOMA PANEL, SERUM
Albumin SerPl Elph-Mcnc: 3.6 g/dL (ref 2.9–4.4)
Albumin/Glob SerPl: 1.5 (ref 0.7–1.7)
Alpha 1: 0.2 g/dL (ref 0.0–0.4)
Alpha2 Glob SerPl Elph-Mcnc: 0.6 g/dL (ref 0.4–1.0)
B-Globulin SerPl Elph-Mcnc: 0.9 g/dL (ref 0.7–1.3)
Gamma Glob SerPl Elph-Mcnc: 0.8 g/dL (ref 0.4–1.8)
Globulin, Total: 2.5 g/dL (ref 2.2–3.9)
IgA: 100 mg/dL (ref 87–352)
IgG (Immunoglobin G), Serum: 1033 mg/dL (ref 586–1602)
IgM (Immunoglobulin M), Srm: 37 mg/dL (ref 26–217)
Total Protein ELP: 6.1 g/dL (ref 6.0–8.5)

## 2023-06-06 ENCOUNTER — Other Ambulatory Visit: Payer: Self-pay | Admitting: *Deleted

## 2023-06-06 DIAGNOSIS — C9001 Multiple myeloma in remission: Secondary | ICD-10-CM

## 2023-06-06 DIAGNOSIS — Z9884 Bariatric surgery status: Secondary | ICD-10-CM | POA: Diagnosis not present

## 2023-06-06 DIAGNOSIS — C9000 Multiple myeloma not having achieved remission: Secondary | ICD-10-CM

## 2023-06-06 DIAGNOSIS — G629 Polyneuropathy, unspecified: Secondary | ICD-10-CM | POA: Diagnosis not present

## 2023-06-06 DIAGNOSIS — Z86718 Personal history of other venous thrombosis and embolism: Secondary | ICD-10-CM | POA: Diagnosis not present

## 2023-06-06 DIAGNOSIS — K921 Melena: Secondary | ICD-10-CM | POA: Diagnosis not present

## 2023-06-06 LAB — OCCULT BLOOD X 1 CARD TO LAB, STOOL
Fecal Occult Bld: NEGATIVE
Fecal Occult Bld: POSITIVE — AB
Fecal Occult Bld: POSITIVE — AB

## 2023-06-11 ENCOUNTER — Inpatient Hospital Stay (HOSPITAL_BASED_OUTPATIENT_CLINIC_OR_DEPARTMENT_OTHER): Payer: Medicare Other | Admitting: Hematology

## 2023-06-11 VITALS — BP 134/73 | HR 66 | Temp 97.7°F | Resp 18 | Wt 209.5 lb

## 2023-06-11 DIAGNOSIS — Z86718 Personal history of other venous thrombosis and embolism: Secondary | ICD-10-CM | POA: Diagnosis not present

## 2023-06-11 DIAGNOSIS — K921 Melena: Secondary | ICD-10-CM | POA: Diagnosis not present

## 2023-06-11 DIAGNOSIS — C9001 Multiple myeloma in remission: Secondary | ICD-10-CM | POA: Diagnosis not present

## 2023-06-11 DIAGNOSIS — Z9884 Bariatric surgery status: Secondary | ICD-10-CM | POA: Diagnosis not present

## 2023-06-11 DIAGNOSIS — C9000 Multiple myeloma not having achieved remission: Secondary | ICD-10-CM | POA: Diagnosis not present

## 2023-06-11 DIAGNOSIS — G629 Polyneuropathy, unspecified: Secondary | ICD-10-CM | POA: Diagnosis not present

## 2023-06-11 NOTE — Progress Notes (Addendum)
HEMATOLOGY/ONCOLOGY CLINIC NOTE  Date of Service: 06/11/2023   PCP: Patriciaann Clan MD  CHIEF COMPLAINTS/PURPOSE OF CONSULTATION:  Follow-up for continued evaluation and management of multiple myeloma  HISTORY OF PRESENTING ILLNESS:  See previous note for details on initial presentation  INTERVAL HISTORY:  Madison Parker is a 72 y.o. female who is here to continue evaluation and management of multiple myeloma.  I last connected with the patient via tele-med visit on 03/20/2023 and she was doing well overall.   Patient notes she has been doing well overall since our last visit. Patient notes that she saw worms in her stool and had occult blood stool testing with her PCP on 06/06/2023 which was positive for blood in stool.   She had a coloscopy last year which showed diverticulosis.   She denies taking iron supplement. Patient is regularly taking Vitamin-D supplement.   She denies fever, chills, night sweats, unexpected weight loss, abdominal pain, chest pain, back pain, abnormal bowel movement, black stool, hematuria, or leg swelling.    MEDICAL HISTORY:  Past Medical History:  Diagnosis Date   Headache    Low back pain    SBO (small bowel obstruction) (HCC) 2010   Smoldering multiple myeloma   Previous history of hypothyroidism- not currently medications Anxiety Obesity .There is no height or weight on file to calculate BMI. 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 Lumbosacral radiculopathy Left hip pain  SURGICAL HISTORY: Past Surgical History:  Procedure Laterality Date   ABDOMINAL HYSTERECTOMY     complete   BACK SURGERY     lower at [facility]   COLONOSCOPY WITH PROPOFOL N/A 08/23/2020   Procedure: COLONOSCOPY WITH PROPOFOL;  Surgeon: Dolores Frame, MD;  Location: [facility];  Service: Gastroenterology;  Laterality: N/A;  900   colonscopy  2014   GASTRIC BYPASS  yrs ago   HERNIA REPAIR      IR IMAGING GUIDED PORT INSERTION  04/20/2019   KNEE SURGERY  06/04/2009   both knees replaced    POLYPECTOMY  08/23/2020   Procedure: POLYPECTOMY;  Surgeon: Dolores Frame, MD;  Location: [facility];  Service: Gastroenterology;;   TONSILLECTOMY  age 28   TOTAL HIP ARTHROPLASTY Left 08/16/2017   Procedure: LEFT TOTAL HIP ARTHROPLASTY ANTERIOR APPROACH;  Surgeon: Jodi Geralds, MD;  Location: [facility];  Service: Orthopedics;  Laterality: Left;  Spinal surgery April 2017  SOCIAL HISTORY: Social History   Socioeconomic History   Marital status: Married    Spouse name: Not on file   Number of children: Not on file   Years of education: Not on file   Highest education level: Not on file  Occupational History   Not on file  Tobacco Use   Smoking status: Former    Current packs/day: 0.50    Average packs/day: 0.5 packs/day for 10.0 years (5.0 ttl pk-yrs)    Types: Cigarettes   Smokeless tobacco: Never   Tobacco comments:    quit 1977  Vaping Use   Vaping status: Never Used  Substance and Sexual Activity   Alcohol use: Yes    Comment: occ   Drug use: No   Sexual activity: Not on file  Other Topics Concern   Not on file  Social History Narrative   Not on file   Social Determinants of Health   Financial Resource Strain: Not on file  Food Insecurity: Not on file  Transportation Needs: Not on file  Physical Activity: Not on file  Stress: Not on file  Social Connections: Not on file  Intimate Partner Violence: Not At Risk (03/22/2021)   Humiliation, Afraid, Rape, and Kick questionnaire    Fear of Current or Ex-Partner: No    Emotionally Abused: No    Physically Abused: No    Sexually Abused: No  Occasional alcohol use Former smoker and smoked 1 pack per week for about 9 years has since quit.  FAMILY HISTORY: Family History  Problem Relation Age of Onset   Breast cancer Neg Hx     ALLERGIES:  is allergic to codeine.  MEDICATIONS:  Current Outpatient  Medications  Medication Sig Dispense Refill   acyclovir (ZOVIRAX) 800 MG tablet Take 0.5 tablets (400 mg total) by mouth 2 (two) times daily. 60 tablet 11   Calcium Carbonate-Vitamin D 600-200 MG-UNIT TABS Calcium 600 with Vitamin D3 600 mg-5 mcg (200 unit) tablet  Take 1 tablet every day by oral route as directed.     Cholecalciferol 1.25 MG (50000 UT) TABS Take by mouth.     clobetasol cream (TEMOVATE) 0.05 % Apply 1 Application topically 2 (two) times daily. USE FOR 2 WEEKS then STOP 45 g 3   fluconazole (DIFLUCAN) 100 MG tablet Take 1 tablet (100 mg total) by mouth daily. 14 tablet 0   fluticasone (CUTIVATE) 0.05 % cream Apply 1 Application topically daily.     FOLIC ACID PO Take 1,360 mg by mouth daily.      lidocaine-prilocaine (EMLA) cream Apply 1 application. topically as needed. 30 g 2   NYSTATIN powder Apply topically 2 (two) times daily. 15 g 1   nystatin-triamcinolone (MYCOLOG II) cream Apply 1 Application topically 2 (two) times daily.     Probiotic Product (PROBIOTIC DAILY PO) Take 2 capsules by mouth daily. Gummies     tacrolimus (PROTOPIC) 0.1 % ointment Apply topically 2 (two) times daily. 100 g 3   vitamin B-12 (CYANOCOBALAMIN) 1000 MCG tablet Take 1,000 mcg by mouth daily.      Vitamin D, Ergocalciferol, (DRISDOL) 1.25 MG (50000 UNIT) CAPS capsule TAKE 1 CAPSULE ONCE A WEEK (Patient taking differently: Take 50,000 Units by mouth every 7 (seven) days. Sunday) 13 capsule 3   No current facility-administered medications for this visit.   Facility-Administered Medications Ordered in Other Visits  Medication Dose Route Frequency Provider Last Rate Last Admin   sodium chloride flush (NS) 0.9 % injection 10 mL  10 mL Intracatheter PRN Johney Maine, MD   10 mL at 05/11/19 1437    REVIEW OF SYSTEMS:   10 Point review of Systems was done is negative except as noted above.   PHYSICAL EXAMINATION:  .BP 134/73   Pulse 66   Temp 97.7 F (36.5 C)   Resp 18   Wt 209 lb 8  oz (95 kg)   SpO2 100%   BMI 35.96 kg/m   GENERAL:alert, in no acute distress and comfortable SKIN: no acute rashes, no significant lesions EYES: conjunctiva are pink and non-injected, sclera anicteric OROPHARYNX: MMM, no exudates, no oropharyngeal erythema or ulceration NECK: supple, no JVD LYMPH:  no palpable lymphadenopathy in the cervical, axillary or inguinal regions LUNGS: clear to auscultation b/l with normal respiratory effort HEART: regular rate & rhythm ABDOMEN:  normoactive bowel sounds , non tender, not distended. Extremity: no pedal edema PSYCH: alert & oriented x 3 with fluent speech NEURO: no focal motor/sensory deficits  LABORATORY DATA:  I have reviewed the data as listed  .  Latest Ref Rng & Units 05/29/2023   10:35 AM 03/06/2023   10:24 AM 12/19/2022    9:27 AM  CBC  WBC 4.0 - 10.5 K/uL 5.0  4.3  3.4   Hemoglobin 12.0 - 15.0 g/dL 98.1  19.1  47.8   Hematocrit 36.0 - 46.0 % 33.9  37.1  34.0   Platelets 150 - 400 K/uL 172  165  144    CBC    Component Value Date/Time   WBC 5.0 05/29/2023 1035   WBC 3.3 (L) 10/04/2021 0902   RBC 3.56 (L) 05/29/2023 1035   HGB 11.3 (L) 05/29/2023 1035   HGB 11.3 (L) 09/30/2017 1145   HCT 33.9 (L) 05/29/2023 1035   HCT 35.1 09/30/2017 1145   PLT 172 05/29/2023 1035   PLT 172 09/30/2017 1145   MCV 95.2 05/29/2023 1035   MCV 97.5 09/30/2017 1145   MCH 31.7 05/29/2023 1035   MCHC 33.3 05/29/2023 1035   RDW 11.7 05/29/2023 1035   RDW 14.0 09/30/2017 1145   LYMPHSABS 2.1 05/29/2023 1035   LYMPHSABS 1.3 09/30/2017 1145   MONOABS 0.4 05/29/2023 1035   MONOABS 0.2 09/30/2017 1145   EOSABS 0.0 05/29/2023 1035   EOSABS 0.0 09/30/2017 1145   EOSABS 0.1 06/03/2014 1003   BASOSABS 0.0 05/29/2023 1035   BASOSABS 0.0 09/30/2017 1145     .    Latest Ref Rng & Units 05/29/2023   10:35 AM 03/06/2023   10:24 AM 12/19/2022    9:27 AM  CMP  Glucose 70 - 99 mg/dL 86  87  295   BUN 8 - 23 mg/dL 15  11  13    Creatinine 0.44 -  1.00 mg/dL 6.21  3.08  6.57   Sodium 135 - 145 mmol/L 140  139  140   Potassium 3.5 - 5.1 mmol/L 3.8  4.0  3.5   Chloride 98 - 111 mmol/L 111  108  110   CO2 22 - 32 mmol/L 25  25  24    Calcium 8.9 - 10.3 mg/dL 8.5  8.9  9.0   Total Protein 6.5 - 8.1 g/dL 6.6  6.7  6.5   Total Bilirubin 0.3 - 1.2 mg/dL 0.7  1.0  0.8   Alkaline Phos 38 - 126 U/L 77  71  59   AST 15 - 41 U/L 19  26  21    ALT 0 - 44 U/L 24  29  22     03/11/19 BM Bx:   03/11/19 Cytogenetics:            RADIOGRAPHIC STUDIES: I have personally reviewed the radiological images as listed and agreed with the findings in the report. No results found.   Bone survey 11/08/2016 IMPRESSION: 1. Small lytic lesion in the right scapula. 2. Possible small lytic lesion in the left scapula. 3. Postoperative changes. 4. Significant degenerative changes in both hips, left greater than right. 5. No evidence for acute fracture     Electronically Signed   By: Norva Pavlov M.D.   On: 11/08/2016 16:19   ASSESSMENT & PLAN:   72 y.o. very pleasant lady with history of   1) R-ISS 3 light chain multiple myeloma (concern for small lytic lesions in left and right scapulae) - (appears light chain producing) and Progressive anemia This was apparently diagnosed as smoldering myeloma in 2011 by her hematologist in [place]. No renal failure/hypercalcemia. Bone survey with concern for possible small lytic lesions in B/L scapulae. PET/CT scan on 03/12/2017 showed  no concerning bone lesions. Initial Bm Bx with 17% kappa restricted plasma cells consistent with plasma cell neoplasm. No treatment so far. 03/11/19 BM Bx revealed involvement by 50% plasma cells; genetics -high risk t(14;16), previous genetics from April 2018 BM Bx were standard risk 03/16/19 MRI Lumbar reveals several explanations for her lower back pain including L2-L3 stenosis, but reassuringly, there was not evidence of involvement by multiple myeloma 04/01/19  PET/CT which revealed no FDG evidence of active multiple myeloma within the skeleton. No evidence of lytic lesions within the skeleton or soft tissue Plasmacytoma. 05/07/2019 DXA scan revealed osteopenia, T-score of -1.2.  2) Chronic leukopenia - ? Related to SMM vs other nutritional deficiencies (h/o gastric bypass surgery put her at risk for nutritional deficiencies).  WBC counts of have been close to normal recently today at 3.3k with an ANC of 1200. B12 levels WNL Ferritin adequate ?additional factor -recent NSAID use. ?element of benign ethnic neutropenia. Has not had any issues with frequent infection.   3) Neuropathy/radiculopathy -Continue follow up with orthopedics  4) history of superficial Venous Thrombosis of the left small saphenous vein  06/24/2019 US venous left : Right: There is no evidence of deep vein thrombosis in the lower extremity. No cystic structure found in the popliteal fossa. Left: Findings consistent with acute superficial vein thrombosis involving the left small saphenous vein. There is no evidence of deep vein thrombosis in the lower extremity.   PLAN: -Patient notes no acute new clinical concerns suggestive of myeloma progression at this time. -Patient has no lab or clinical evidence of myeloma progression at this time and continues to desire staying off maintenance treatments at this time. -Discussed lab results from 05/29/2023 with the patient. CBC shows slightly decreased hemoglobin at 11.3 g/dL and slightly decreased hematocrit at 33.9%. CMP shows slightly decreased calcium level at 8.5, but stable overall. Ferritin level at 71 and iron saturation at 24%. -Discussed multiple myeloma panel results from 05/29/2023 which did not show any abnormalities. Kappa/lambda light chain ratio at 1.07.  -Discussed the occult blood stool tests from 06/06/2023. 2 out of 3 stool tested positive for blood.  -Recommend to follow-up with PCP regarding stool testing and receive  stool testing for worms in stool and consideration of GI consultation.  FOLLOW-UP: Portflush and labs in 10 weeks RTC with Dr Candise Che in 12 weeks  The total time spent in the appointment was 23 minutes* .  All of the patient's questions were answered with apparent satisfaction. The patient knows to call the clinic with any problems, questions or concerns.   Wyvonnia Lora MD MS AAHIVMS [facility] Hematology/Oncology Physician [facility]  .*Total Encounter Time as defined by the Centers for Medicare and Medicaid Services includes, in addition to the face-to-face time of a patient visit (documented in the note above) non-face-to-face time: obtaining and reviewing outside history, ordering and reviewing medications, tests or procedures, care coordination (communications with other health care professionals or caregivers) and documentation in the medical record.   I,Param Shah,acting as a Neurosurgeon for Wyvonnia Lora, MD.,have documented all relevant documentation on the behalf of Wyvonnia Lora, MD,as directed by  Wyvonnia Lora, MD while in the presence of Wyvonnia Lora, MD.  .I have reviewed the above documentation for accuracy and completeness, and I agree with the above. Johney Maine MD

## 2023-06-12 ENCOUNTER — Telehealth: Payer: Self-pay | Admitting: Hematology

## 2023-06-12 NOTE — Telephone Encounter (Signed)
Patient is aware of scheduled appointment times/dates

## 2023-06-14 ENCOUNTER — Other Ambulatory Visit: Payer: Self-pay

## 2023-06-17 ENCOUNTER — Encounter: Payer: Self-pay | Admitting: Hematology

## 2023-06-18 DIAGNOSIS — Z0000 Encounter for general adult medical examination without abnormal findings: Secondary | ICD-10-CM | POA: Diagnosis not present

## 2023-06-19 ENCOUNTER — Other Ambulatory Visit: Payer: Self-pay

## 2023-06-21 DIAGNOSIS — R195 Other fecal abnormalities: Secondary | ICD-10-CM | POA: Diagnosis not present

## 2023-06-28 DIAGNOSIS — R350 Frequency of micturition: Secondary | ICD-10-CM | POA: Diagnosis not present

## 2023-07-09 ENCOUNTER — Other Ambulatory Visit: Payer: Self-pay | Admitting: Hematology

## 2023-07-11 ENCOUNTER — Encounter: Payer: Self-pay | Admitting: Hematology

## 2023-08-05 ENCOUNTER — Encounter (INDEPENDENT_AMBULATORY_CARE_PROVIDER_SITE_OTHER): Payer: Self-pay | Admitting: *Deleted

## 2023-08-08 ENCOUNTER — Other Ambulatory Visit: Payer: Self-pay

## 2023-08-08 DIAGNOSIS — C9001 Multiple myeloma in remission: Secondary | ICD-10-CM

## 2023-08-12 ENCOUNTER — Inpatient Hospital Stay: Payer: Medicare Other | Attending: Hematology

## 2023-08-12 ENCOUNTER — Inpatient Hospital Stay (HOSPITAL_BASED_OUTPATIENT_CLINIC_OR_DEPARTMENT_OTHER): Payer: Medicare Other | Admitting: Hematology

## 2023-08-12 VITALS — BP 134/80 | HR 63 | Temp 97.9°F | Resp 18 | Wt 209.7 lb

## 2023-08-12 DIAGNOSIS — M8580 Other specified disorders of bone density and structure, unspecified site: Secondary | ICD-10-CM | POA: Diagnosis not present

## 2023-08-12 DIAGNOSIS — C9001 Multiple myeloma in remission: Secondary | ICD-10-CM | POA: Diagnosis not present

## 2023-08-12 DIAGNOSIS — G629 Polyneuropathy, unspecified: Secondary | ICD-10-CM | POA: Insufficient documentation

## 2023-08-12 DIAGNOSIS — Z95828 Presence of other vascular implants and grafts: Secondary | ICD-10-CM

## 2023-08-12 DIAGNOSIS — C9000 Multiple myeloma not having achieved remission: Secondary | ICD-10-CM | POA: Diagnosis not present

## 2023-08-12 DIAGNOSIS — Z86718 Personal history of other venous thrombosis and embolism: Secondary | ICD-10-CM | POA: Insufficient documentation

## 2023-08-12 DIAGNOSIS — Z452 Encounter for adjustment and management of vascular access device: Secondary | ICD-10-CM | POA: Diagnosis not present

## 2023-08-12 LAB — CBC WITH DIFFERENTIAL (CANCER CENTER ONLY)
Abs Immature Granulocytes: 0.01 10*3/uL (ref 0.00–0.07)
Basophils Absolute: 0 10*3/uL (ref 0.0–0.1)
Basophils Relative: 1 %
Eosinophils Absolute: 0.1 10*3/uL (ref 0.0–0.5)
Eosinophils Relative: 1 %
HCT: 34.5 % — ABNORMAL LOW (ref 36.0–46.0)
Hemoglobin: 11.8 g/dL — ABNORMAL LOW (ref 12.0–15.0)
Immature Granulocytes: 0 %
Lymphocytes Relative: 53 %
Lymphs Abs: 2.3 10*3/uL (ref 0.7–4.0)
MCH: 32.2 pg (ref 26.0–34.0)
MCHC: 34.2 g/dL (ref 30.0–36.0)
MCV: 94.3 fL (ref 80.0–100.0)
Monocytes Absolute: 0.4 10*3/uL (ref 0.1–1.0)
Monocytes Relative: 8 %
Neutro Abs: 1.6 10*3/uL — ABNORMAL LOW (ref 1.7–7.7)
Neutrophils Relative %: 37 %
Platelet Count: 175 10*3/uL (ref 150–400)
RBC: 3.66 MIL/uL — ABNORMAL LOW (ref 3.87–5.11)
RDW: 11.5 % (ref 11.5–15.5)
WBC Count: 4.4 10*3/uL (ref 4.0–10.5)
nRBC: 0 % (ref 0.0–0.2)

## 2023-08-12 LAB — CMP (CANCER CENTER ONLY)
ALT: 20 U/L (ref 0–44)
AST: 19 U/L (ref 15–41)
Albumin: 3.9 g/dL (ref 3.5–5.0)
Alkaline Phosphatase: 76 U/L (ref 38–126)
Anion gap: 5 (ref 5–15)
BUN: 11 mg/dL (ref 8–23)
CO2: 25 mmol/L (ref 22–32)
Calcium: 9.4 mg/dL (ref 8.9–10.3)
Chloride: 108 mmol/L (ref 98–111)
Creatinine: 0.81 mg/dL (ref 0.44–1.00)
GFR, Estimated: >60 mL/min (ref >60)
Glucose, Bld: 85 mg/dL (ref 70–99)
Potassium: 4 mmol/L (ref 3.5–5.1)
Sodium: 138 mmol/L (ref 135–145)
Total Bilirubin: 0.8 mg/dL (ref 0.3–1.2)
Total Protein: 6.7 g/dL (ref 6.5–8.1)

## 2023-08-12 LAB — FERRITIN: Ferritin: 56 ng/mL (ref 11–307)

## 2023-08-12 LAB — IRON AND IRON BINDING CAPACITY (CC-WL,HP ONLY)
Iron: 82 ug/dL (ref 28–170)
Saturation Ratios: 25 % (ref 10.4–31.8)
TIBC: 329 ug/dL (ref 250–450)
UIBC: 247 ug/dL (ref 148–442)

## 2023-08-12 MED ORDER — SODIUM CHLORIDE 0.9% FLUSH
10.0000 mL | Freq: Once | INTRAVENOUS | Status: AC
Start: 2023-08-12 — End: 2023-08-12
  Administered 2023-08-12: 10 mL

## 2023-08-12 MED ORDER — HEPARIN SOD (PORK) LOCK FLUSH 100 UNIT/ML IV SOLN
500.0000 [IU] | Freq: Once | INTRAVENOUS | Status: AC
  Administered 2023-08-12: 500 [IU]

## 2023-08-12 NOTE — Progress Notes (Signed)
HEMATOLOGY/ONCOLOGY CLINIC NOTE  Date of Service: 08/12/2023   PCP: Patriciaann Clan MD  CHIEF COMPLAINTS/PURPOSE OF CONSULTATION:  Follow-up for continued evaluation and management of multiple myeloma  HISTORY OF PRESENTING ILLNESS:  See previous note for details on initial presentation  INTERVAL HISTORY:  Carnesha Maravilla is a 72 y.o. female who is here to continue evaluation and management of multiple myeloma.  Patient was last seen by me on 06/11/2023 and she reported blood in stool on 06/06/2023.   Patient notes she has been doing well overall since our last visit. She complains of left leg weakness, which improves when she walks around.   Patient has been following-up with her PCP regarding worm and blood in her stool. She has been referred to gastroenterologist and will be scheduled for a colonoscopy.   He denies any new infection issues, fever, chills, night sweats, SOB, chest pain, abdominal pain, back pain, or leg swelling.    MEDICAL HISTORY:  Past Medical History:  Diagnosis Date   Headache    Low back pain    SBO (small bowel obstruction) (HCC) 2010   Smoldering multiple myeloma   Previous history of hypothyroidism- not currently medications Anxiety Obesity .There is no height or weight on file to calculate BMI. 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 Lumbosacral radiculopathy Left hip pain  SURGICAL HISTORY: Past Surgical History:  Procedure Laterality Date   ABDOMINAL HYSTERECTOMY     complete   BACK SURGERY     lower at [facility]   COLONOSCOPY WITH PROPOFOL N/A 08/23/2020   Procedure: COLONOSCOPY WITH PROPOFOL;  Surgeon: Dolores Frame, MD;  [facility];  Service: Gastroenterology;  Laterality: N/A;  900   colonscopy  2014   GASTRIC BYPASS  yrs ago   HERNIA REPAIR     IR IMAGING GUIDED PORT INSERTION  04/20/2019   KNEE SURGERY  06/04/2009   both knees replaced     POLYPECTOMY  08/23/2020   Procedure: POLYPECTOMY;  Surgeon: Dolores Frame, MD;  [facility];  Service: Gastroenterology;;   TONSILLECTOMY  age 65   TOTAL HIP ARTHROPLASTY Left 08/16/2017   Procedure: LEFT TOTAL HIP ARTHROPLASTY ANTERIOR APPROACH;  Surgeon: Jodi Geralds, MD;  Location: [facility];  Service: Orthopedics;  Laterality: Left;  Spinal surgery April 2017  SOCIAL HISTORY: Social History   Socioeconomic History   Marital status: Married    Spouse name: Not on file   Number of children: Not on file   Years of education: Not on file   Highest education level: Not on file  Occupational History   Not on file  Tobacco Use   Smoking status: Former    Current packs/day: 0.50    Average packs/day: 0.5 packs/day for 10.0 years (5.0 ttl pk-yrs)    Types: Cigarettes   Smokeless tobacco: Never   Tobacco comments:    quit 1977  Vaping Use   Vaping status: Never Used  Substance and Sexual Activity   Alcohol use: Yes    Comment: occ   Drug use: No   Sexual activity: Not on file  Other Topics Concern   Not on file  Social History Narrative   Not on file   Social Determinants of Health   Financial Resource Strain: Not on file  Food Insecurity: Not on file  Transportation Needs: Not on file  Physical Activity: Not on file  Stress: Not on file  Social Connections: Not  on file  Intimate Partner Violence: Not At Risk (03/22/2021)   Humiliation, Afraid, Rape, and Kick questionnaire    Fear of Current or Ex-Partner: No    Emotionally Abused: No    Physically Abused: No    Sexually Abused: No  Occasional alcohol use Former smoker and smoked 1 pack per week for about 9 years has since quit.  FAMILY HISTORY: Family History  Problem Relation Age of Onset   Breast cancer Neg Hx     ALLERGIES:  is allergic to codeine.  MEDICATIONS:  Current Outpatient Medications  Medication Sig Dispense Refill   acyclovir (ZOVIRAX) 800 MG tablet Take 0.5 tablets (400 mg  total) by mouth 2 (two) times daily. 60 tablet 11   Calcium Carbonate-Vitamin D 600-200 MG-UNIT TABS Calcium 600 with Vitamin D3 600 mg-5 mcg (200 unit) tablet  Take 1 tablet every day by oral route as directed.     Cholecalciferol 1.25 MG (50000 UT) TABS Take by mouth.     clobetasol cream (TEMOVATE) 0.05 % Apply 1 Application topically 2 (two) times daily. USE FOR 2 WEEKS then STOP 45 g 3   fluconazole (DIFLUCAN) 100 MG tablet Take 1 tablet (100 mg total) by mouth daily. 14 tablet 0   fluticasone (CUTIVATE) 0.05 % cream Apply 1 Application topically daily.     FOLIC ACID PO Take 1,360 mg by mouth daily.      lidocaine-prilocaine (EMLA) cream Apply 1 application. topically as needed. 30 g 2   NYSTATIN powder Apply topically 2 (two) times daily. 15 g 1   nystatin-triamcinolone (MYCOLOG II) cream Apply 1 Application topically 2 (two) times daily.     Probiotic Product (PROBIOTIC DAILY PO) Take 2 capsules by mouth daily. Gummies     tacrolimus (PROTOPIC) 0.1 % ointment Apply topically 2 (two) times daily. 100 g 3   vitamin B-12 (CYANOCOBALAMIN) 1000 MCG tablet Take 1,000 mcg by mouth daily.      Vitamin D, Ergocalciferol, (DRISDOL) 1.25 MG (50000 UNIT) CAPS capsule TAKE 1 CAPSULE ONCE A WEEK (Patient taking differently: Take 50,000 Units by mouth every 7 (seven) days. Sunday) 13 capsule 3   No current facility-administered medications for this visit.   Facility-Administered Medications Ordered in Other Visits  Medication Dose Route Frequency Provider Last Rate Last Admin   sodium chloride flush (NS) 0.9 % injection 10 mL  10 mL Intracatheter PRN Johney Maine, MD   10 mL at 05/11/19 1437    REVIEW OF SYSTEMS:   10 Point review of Systems was done is negative except as noted above.   PHYSICAL EXAMINATION:  .BP 134/80   Pulse 63   Temp 97.9 F (36.6 C)   Resp 18   Wt 209 lb 11.2 oz (95.1 kg)   SpO2 100%   BMI 35.99 kg/m   GENERAL:alert, in no acute distress and  comfortable SKIN: no acute rashes, no significant lesions EYES: conjunctiva are pink and non-injected, sclera anicteric OROPHARYNX: MMM, no exudates, no oropharyngeal erythema or ulceration NECK: supple, no JVD LYMPH:  no palpable lymphadenopathy in the cervical, axillary or inguinal regions LUNGS: clear to auscultation b/l with normal respiratory effort HEART: regular rate & rhythm ABDOMEN:  normoactive bowel sounds , non tender, not distended. Extremity: no pedal edema PSYCH: alert & oriented x 3 with fluent speech NEURO: no focal motor/sensory deficits  LABORATORY DATA:  I have reviewed the data as listed  .    Latest Ref Rng & Units 08/12/2023   10:09 AM  05/29/2023   10:35 AM 03/06/2023   10:24 AM  CBC  WBC 4.0 - 10.5 K/uL 4.4  5.0  4.3   Hemoglobin 12.0 - 15.0 g/dL 16.1  09.6  04.5   Hematocrit 36.0 - 46.0 % 34.5  33.9  37.1   Platelets 150 - 400 K/uL 175  172  165    CBC    Component Value Date/Time   WBC 4.4 08/12/2023 1009   WBC 3.3 (L) 10/04/2021 0902   RBC 3.66 (L) 08/12/2023 1009   HGB 11.8 (L) 08/12/2023 1009   HGB 11.3 (L) 09/30/2017 1145   HCT 34.5 (L) 08/12/2023 1009   HCT 35.1 09/30/2017 1145   PLT 175 08/12/2023 1009   PLT 172 09/30/2017 1145   MCV 94.3 08/12/2023 1009   MCV 97.5 09/30/2017 1145   MCH 32.2 08/12/2023 1009   MCHC 34.2 08/12/2023 1009   RDW 11.5 08/12/2023 1009   RDW 14.0 09/30/2017 1145   LYMPHSABS 2.3 08/12/2023 1009   LYMPHSABS 1.3 09/30/2017 1145   MONOABS 0.4 08/12/2023 1009   MONOABS 0.2 09/30/2017 1145   EOSABS 0.1 08/12/2023 1009   EOSABS 0.0 09/30/2017 1145   EOSABS 0.1 06/03/2014 1003   BASOSABS 0.0 08/12/2023 1009   BASOSABS 0.0 09/30/2017 1145     .    Latest Ref Rng & Units 08/12/2023   10:09 AM 05/29/2023   10:35 AM 03/06/2023   10:24 AM  CMP  Glucose 70 - 99 mg/dL 85  86  87   BUN 8 - 23 mg/dL 11  15  11    Creatinine 0.44 - 1.00 mg/dL 4.09  8.11  9.14   Sodium 135 - 145 mmol/L 138  140  139   Potassium 3.5  - 5.1 mmol/L 4.0  3.8  4.0   Chloride 98 - 111 mmol/L 108  111  108   CO2 22 - 32 mmol/L 25  25  25    Calcium 8.9 - 10.3 mg/dL 9.4  8.5  8.9   Total Protein 6.5 - 8.1 g/dL 6.7  6.6  6.7   Total Bilirubin 0.3 - 1.2 mg/dL 0.8  0.7  1.0   Alkaline Phos 38 - 126 U/L 76  77  71   AST 15 - 41 U/L 19  19  26    ALT 0 - 44 U/L 20  24  29     03/11/19 BM Bx:   03/11/19 Cytogenetics:            RADIOGRAPHIC STUDIES: I have personally reviewed the radiological images as listed and agreed with the findings in the report. No results found.   Bone survey 11/08/2016 IMPRESSION: 1. Small lytic lesion in the right scapula. 2. Possible small lytic lesion in the left scapula. 3. Postoperative changes. 4. Significant degenerative changes in both hips, left greater than right. 5. No evidence for acute fracture     Electronically Signed   By: Norva Pavlov M.D.   On: 11/08/2016 16:19   ASSESSMENT & PLAN:   72 y.o. very pleasant lady with history of   1) R-ISS 3 light chain multiple myeloma (concern for small lytic lesions in left and right scapulae) - (appears light chain producing) and Progressive anemia This was apparently diagnosed as smoldering myeloma in 2011 by her hematologist in [place]. No renal failure/hypercalcemia. Bone survey with concern for possible small lytic lesions in B/L scapulae. PET/CT scan on 03/12/2017 showed no concerning bone lesions. Initial Bm Bx with 17% kappa restricted plasma  cells consistent with plasma cell neoplasm. No treatment so far. 03/11/19 BM Bx revealed involvement by 50% plasma cells; genetics -high risk t(14;16), previous genetics from April 2018 BM Bx were standard risk 03/16/19 MRI Lumbar reveals several explanations for her lower back pain including L2-L3 stenosis, but reassuringly, there was not evidence of involvement by multiple myeloma 04/01/19 PET/CT which revealed no FDG evidence of active multiple myeloma within the skeleton. No  evidence of lytic lesions within the skeleton or soft tissue Plasmacytoma. 05/07/2019 DXA scan revealed osteopenia, T-score of -1.2.  2) Chronic leukopenia - ? Related to SMM vs other nutritional deficiencies (h/o gastric bypass surgery put her at risk for nutritional deficiencies).  WBC counts of have been close to normal recently today at 3.3k with an ANC of 1200. B12 levels WNL Ferritin adequate ?additional factor -recent NSAID use. ?element of benign ethnic neutropenia. Has not had any issues with frequent infection.   3) Neuropathy/radiculopathy -Continue follow up with orthopedics  4) history of superficial Venous Thrombosis of the left small saphenous vein  06/24/2019 US venous left : Right: There is no evidence of deep vein thrombosis in the lower extremity. No cystic structure found in the popliteal fossa. Left: Findings consistent with acute superficial vein thrombosis involving the left small saphenous vein. There is no evidence of deep vein thrombosis in the lower extremity.   PLAN: -Patient notes no acute new clinical concerns suggestive of myeloma progression at this time. -Patient has no lab or clinical evidence of myeloma progression at this time and continues to desire staying off maintenance treatments at this time. -Discussed lab results from today, 08/12/2023, in detail with the patient. CBC and CMP are stable.   SFLC WNL -Recommend to follow-up with PCP regarding stool testing and receive stool testing for worms in stool and consideration of GI consultation. Recommend to follow-up with his Gastroenterologist. -Recommend influenza vaccine, COVID-19 Booster, RSV vaccine, and other age-related vaccines.    FOLLOW-UP: Portflush and labs and MD visit in 3 months  The total time spent in the appointment was 23 minutes* .  All of the patient's questions were answered with apparent satisfaction. The patient knows to call the clinic with any problems, questions or  concerns.   Wyvonnia Lora MD MS AAHIVMS [facility] Hematology/Oncology Physician [facility]  .*Total Encounter Time as defined by the Centers for Medicare and Medicaid Services includes, in addition to the face-to-face time of a patient visit (documented in the note above) non-face-to-face time: obtaining and reviewing outside history, ordering and reviewing medications, tests or procedures, care coordination (communications with other health care professionals or caregivers) and documentation in the medical record.   I,Param Shah,acting as a Neurosurgeon for Wyvonnia Lora, MD.,have documented all relevant documentation on the behalf of Wyvonnia Lora, MD,as directed by  Wyvonnia Lora, MD while in the presence of Wyvonnia Lora, MD.\   .I have reviewed the above documentation for accuracy and completeness, and I agree with the above. Johney Maine MD

## 2023-08-13 ENCOUNTER — Telehealth: Payer: Self-pay | Admitting: Hematology

## 2023-08-13 LAB — KAPPA/LAMBDA LIGHT CHAINS
Kappa free light chain: 16.4 mg/L (ref 3.3–19.4)
Kappa, lambda light chain ratio: 1.61 (ref 0.26–1.65)
Lambda free light chains: 10.2 mg/L (ref 5.7–26.3)

## 2023-08-13 NOTE — Telephone Encounter (Signed)
Per Candise Che 10/28 los patient is aware of scheduled appointment times/dates

## 2023-08-15 ENCOUNTER — Other Ambulatory Visit: Payer: Self-pay

## 2023-08-15 LAB — MULTIPLE MYELOMA PANEL, SERUM
Albumin SerPl Elph-Mcnc: 3.5 g/dL (ref 2.9–4.4)
Albumin/Glob SerPl: 1.4 (ref 0.7–1.7)
Alpha 1: 0.2 g/dL (ref 0.0–0.4)
Alpha2 Glob SerPl Elph-Mcnc: 0.6 g/dL (ref 0.4–1.0)
B-Globulin SerPl Elph-Mcnc: 1 g/dL (ref 0.7–1.3)
Gamma Glob SerPl Elph-Mcnc: 0.9 g/dL (ref 0.4–1.8)
Globulin, Total: 2.6 g/dL (ref 2.2–3.9)
IgA: 105 mg/dL (ref 64–422)
IgG (Immunoglobin G), Serum: 1033 mg/dL (ref 586–1602)
IgM (Immunoglobulin M), Srm: 39 mg/dL (ref 26–217)
Total Protein ELP: 6.1 g/dL (ref 6.0–8.5)

## 2023-08-18 ENCOUNTER — Encounter: Payer: Self-pay | Admitting: Hematology

## 2023-08-22 ENCOUNTER — Encounter (INDEPENDENT_AMBULATORY_CARE_PROVIDER_SITE_OTHER): Payer: Self-pay | Admitting: *Deleted

## 2023-08-22 ENCOUNTER — Ambulatory Visit (INDEPENDENT_AMBULATORY_CARE_PROVIDER_SITE_OTHER): Payer: Medicare Other | Admitting: Gastroenterology

## 2023-08-22 ENCOUNTER — Encounter (INDEPENDENT_AMBULATORY_CARE_PROVIDER_SITE_OTHER): Payer: Self-pay | Admitting: Gastroenterology

## 2023-08-22 VITALS — BP 126/76 | HR 79 | Temp 97.7°F | Ht 64.0 in | Wt 212.9 lb

## 2023-08-22 DIAGNOSIS — R195 Other fecal abnormalities: Secondary | ICD-10-CM

## 2023-08-22 DIAGNOSIS — D649 Anemia, unspecified: Secondary | ICD-10-CM

## 2023-08-22 DIAGNOSIS — Z860100 Personal history of colon polyps, unspecified: Secondary | ICD-10-CM | POA: Diagnosis not present

## 2023-08-22 MED ORDER — PEG 3350-KCL-NA BICARB-NACL 420 G PO SOLR: 4000.0000 mL | Freq: Once | ORAL | 0 refills | Status: AC

## 2023-08-22 NOTE — Patient Instructions (Signed)
Schedule EGD and colonoscopy Continue anemia follow-up with oncology

## 2023-08-22 NOTE — H&P (View-Only) (Signed)
Katrinka Blazing, M.D. Gastroenterology & Hepatology [facility] Gastroenter[facility] [address] [place], [place] [address]  Primary Care Physician: Benita Stabile, MD [address] [place] [address]  I will communicate my assessment and recommendations to the referring MD via EMR.  Problems: Positive fecal occult blood test  History of Present Illness: Madison Parker is a 72 y.o. female with PMH multiple myeloma status post bone marrow transplant, who presents for evaluation of positive FOBT.  Patient was having recurrent onset of anemia on 05/29/23 - had Hb  11.3 MCV 95. She had fecal FOBT that was positive x2/3 on 06/06/23. Last hb on 08/12/23 was 11.8. Notably, iron stores in 2024 have been normal x4 - does not take any iron.  Previous values of her hemoglobin have fluctuating these range in having even lower given her history of multiple myeloma. She follows with oncology for her MM, she stopped chemotherapy in August 2023. Was advised by them to follow to [place] to check for GI losses.  She reports feeling well otherwise. She reports having very occasional episodes of abdominal cramping that resolved on their own and lasted for a few seconds. The patient denies having any nausea, vomiting, fever, chills, hematochezia, melena, hematemesis, abdominal distention, abdominal pain, diarrhea, jaundice, pruritus or weight loss.  Last EGD: many years ago Last Colonoscopy: 08/23/2020 Presence of a 15 mm polyp in the proximal ascending colon removed with a hot snare (ascending colon).  Diverticulosis of the sigmoid colon and nonbleeding internal hemorrhoids.  Recommend a repeat colonoscopy in 3 years.  Past Medical History: Past Medical History:  Diagnosis Date   Headache    Low back pain    SBO (small bowel obstruction) (HCC) 2010   Smoldering multiple myeloma     Past Surgical History: Past Surgical History:  Procedure Laterality Date   ABDOMINAL  HYSTERECTOMY     complete   BACK SURGERY     lower at [facility]   COLONOSCOPY WITH PROPOFOL N/A 08/23/2020   Procedure: COLONOSCOPY WITH PROPOFOL;  Surgeon: Dolores Frame, MD;  [facility];  Service: Gastroenterology;  Laterality: N/A;  900   colonscopy  2014   GASTRIC BYPASS  yrs ago   HERNIA REPAIR     IR IMAGING GUIDED PORT INSERTION  04/20/2019   KNEE SURGERY  06/04/2009   both knees replaced    POLYPECTOMY  08/23/2020   Procedure: POLYPECTOMY;  Surgeon: Dolores Frame, MD;  [facility];  Service: Gastroenterology;;   TONSILLECTOMY  age 32   TOTAL HIP ARTHROPLASTY Left 08/16/2017   Procedure: LEFT TOTAL HIP ARTHROPLASTY ANTERIOR APPROACH;  Surgeon: Jodi Geralds, MD;  Location: [facility];  Service: Orthopedics;  Laterality: Left;    Family History: Family History  Problem Relation Age of Onset   Breast cancer Neg Hx     Social History: Social History   Tobacco Use  Smoking Status Former   Current packs/day: 0.50   Average packs/day: 0.5 packs/day for 10.0 years (5.0 ttl pk-yrs)   Types: Cigarettes  Smokeless Tobacco Never  Tobacco Comments   quit 1977   Social History   Substance and Sexual Activity  Alcohol Use Yes   Comment: occ   Social History   Substance and Sexual Activity  Drug Use No    Allergies: Allergies  Allergen Reactions   Codeine Anxiety    Medications: Current Outpatient Medications  Medication Sig Dispense Refill   acetaminophen (TYLENOL 8 HOUR) 650  MG CR tablet Take 650 mg by mouth every 8 (eight) hours as needed for pain.     clobetasol cream (TEMOVATE) 0.05 % Apply 1 Application topically 2 (two) times daily. USE FOR 2 WEEKS then STOP 45 g 3   fluticasone (CUTIVATE) 0.05 % cream Apply 1 Application topically daily.     lidocaine-prilocaine (EMLA) cream Apply 1 application. topically as needed. 30 g 2   nystatin-triamcinolone (MYCOLOG II) cream Apply 1 Application topically 2 (two) times daily.      OVER THE COUNTER MEDICATION Vit D with K     Probiotic Product (PROBIOTIC DAILY PO) Take 2 capsules by mouth daily. Gummies     tacrolimus (PROTOPIC) 0.1 % ointment Apply topically 2 (two) times daily. 100 g 3   vitamin B-12 (CYANOCOBALAMIN) 1000 MCG tablet Take 1,000 mcg by mouth daily.      acyclovir (ZOVIRAX) 800 MG tablet Take 0.5 tablets (400 mg total) by mouth 2 (two) times daily. (Patient not taking: Reported on 08/22/2023) 60 tablet 11   No current facility-administered medications for this visit.   Facility-Administered Medications Ordered in Other Visits  Medication Dose Route Frequency Provider Last Rate Last Admin   sodium chloride flush (NS) 0.9 % injection 10 mL  10 mL Intracatheter PRN Johney Maine, MD   10 mL at 05/11/19 1437    Review of Systems: GENERAL: negative for malaise, night sweats HEENT: No changes in hearing or vision, no nose bleeds or other nasal problems. NECK: Negative for lumps, goiter, pain and significant neck swelling RESPIRATORY: Negative for cough, wheezing CARDIOVASCULAR: Negative for chest pain, leg swelling, palpitations, orthopnea GI: SEE HPI MUSCULOSKELETAL: Negative for joint pain or swelling, back pain, and muscle pain. SKIN: Negative for lesions, rash PSYCH: Negative for sleep disturbance, mood disorder and recent psychosocial stressors. HEMATOLOGY Negative for prolonged bleeding, bruising easily, and swollen nodes. ENDOCRINE: Negative for cold or heat intolerance, polyuria, polydipsia and goiter. NEURO: negative for tremor, gait imbalance, syncope and seizures. The remainder of the review of systems is noncontributory.   Physical Exam: BP 126/76 (BP Location: Right Arm, Patient Position: Sitting, Cuff Size: Large)   Pulse 79   Temp 97.7 F (36.5 C) (Oral)   Ht 5\' 4"  (1.626 m)   Wt 212 lb 14.4 oz (96.6 kg)   BMI 36.54 kg/m  GENERAL: The patient is AO x3, in no acute distress. HEENT: Head is normocephalic and atraumatic.  EOMI are intact. Mouth is well hydrated and without lesions. NECK: Supple. No masses LUNGS: Clear to auscultation. No presence of rhonchi/wheezing/rales. Adequate chest expansion HEART: RRR, normal s1 and s2. ABDOMEN: Soft, nontender, no guarding, no peritoneal signs, and nondistended. BS +. No masses. EXTREMITIES: Without any cyanosis, clubbing, rash, lesions or edema. NEUROLOGIC: AOx3, no focal motor deficit. SKIN: no jaundice, no rashes  Imaging/Labs: as above  I personally reviewed and interpreted the available labs, imaging and endoscopic files.  Impression and Plan: Madison Parker is a 72 y.o. female with PMH multiple myeloma status post bone marrow transplant, who presents for evaluation of positive FOBT.  Patient has been asymptomatic and denies any complaints.  Was found to have positive FOBT with minimal drop in her baseline hemoglobin.  Notably, her iron stores have been normal.  Given her positive stool testing and history of colonic polyps, we will proceed with endoscopic evaluation of gastrointestinal losses with an EGD and colonoscopy.  Patient is agreeable to proceed with this.  -Schedule EGD and colonoscopy -Continue anemia follow-up with oncology  All questions were answered.      Katrinka Blazing, MD Gastroenterology and Hepatology [facility]

## 2023-08-22 NOTE — Progress Notes (Signed)
Katrinka Blazing, M.D. Gastroenterology & Hepatology [facility] [address] [place], [place] [address]  Primary Care Physician: Benita Stabile, MD [address] [place] [address]  I will communicate my assessment and recommendations to the referring MD via EMR.  Problems: Positive fecal occult blood test  History of Present Illness: Madison Parker is a 72 y.o. female with PMH multiple myeloma status post bone marrow transplant, who presents for evaluation of positive FOBT.  Patient was having recurrent onset of anemia on 05/29/23 - had Hb  11.3 MCV 95. She had fecal FOBT that was positive x2/3 on 06/06/23. Last hb on 08/12/23 was 11.8. Notably, iron stores in 2024 have been normal x4 - does not take any iron.  Previous values of her hemoglobin have fluctuating these range in having even lower given her history of multiple myeloma. She follows with oncology for her MM, she stopped chemotherapy in August 2023. Was advised by them to follow to [place] to check for GI losses.  She reports feeling well otherwise. She reports having very occasional episodes of abdominal cramping that resolved on their own and lasted for a few seconds. The patient denies having any nausea, vomiting, fever, chills, hematochezia, melena, hematemesis, abdominal distention, abdominal pain, diarrhea, jaundice, pruritus or weight loss.  Last EGD: many years ago Last Colonoscopy: 08/23/2020 Presence of a 15 mm polyp in the proximal ascending colon removed with a hot snare (ascending colon).  Diverticulosis of the sigmoid colon and nonbleeding internal hemorrhoids.  Recommend a repeat colonoscopy in 3 years.  Past Medical History: Past Medical History:  Diagnosis Date   Headache    Low back pain    SBO (small bowel obstruction) (HCC) 2010   Smoldering multiple myeloma     Past Surgical History: Past Surgical History:  Procedure Laterality Date   ABDOMINAL  HYSTERECTOMY     complete   BACK SURGERY     lower at [facility]   COLONOSCOPY WITH PROPOFOL N/A 08/23/2020   Procedure: COLONOSCOPY WITH PROPOFOL;  Surgeon: Dolores Frame, MD;  Location: [facility];  Service: Gastroenterology;  Laterality: N/A;  900   colonscopy  2014   GASTRIC BYPASS  yrs ago   HERNIA REPAIR     IR IMAGING GUIDED PORT INSERTION  04/20/2019   KNEE SURGERY  06/04/2009   both knees replaced    POLYPECTOMY  08/23/2020   Procedure: POLYPECTOMY;  Surgeon: Dolores Frame, MD;  Location: [facility];  Service: Gastroenterology;;   TONSILLECTOMY  age 32   TOTAL HIP ARTHROPLASTY Left 08/16/2017   Procedure: LEFT TOTAL HIP ARTHROPLASTY ANTERIOR APPROACH;  Surgeon: Jodi Geralds, MD;  Location: [facility];  Service: Orthopedics;  Laterality: Left;    Family History: Family History  Problem Relation Age of Onset   Breast cancer Neg Hx     Social History: Social History   Tobacco Use  Smoking Status Former   Current packs/day: 0.50   Average packs/day: 0.5 packs/day for 10.0 years (5.0 ttl pk-yrs)   Types: Cigarettes  Smokeless Tobacco Never  Tobacco Comments   quit 1977   Social History   Substance and Sexual Activity  Alcohol Use Yes   Comment: occ   Social History   Substance and Sexual Activity  Drug Use No    Allergies: Allergies  Allergen Reactions   Codeine Anxiety    Medications: Current Outpatient Medications  Medication Sig Dispense Refill   acetaminophen (TYLENOL 8 HOUR) 650  MG CR tablet Take 650 mg by mouth every 8 (eight) hours as needed for pain.     clobetasol cream (TEMOVATE) 0.05 % Apply 1 Application topically 2 (two) times daily. USE FOR 2 WEEKS then STOP 45 g 3   fluticasone (CUTIVATE) 0.05 % cream Apply 1 Application topically daily.     lidocaine-prilocaine (EMLA) cream Apply 1 application. topically as needed. 30 g 2   nystatin-triamcinolone (MYCOLOG II) cream Apply 1 Application topically 2 (two) times daily.      OVER THE COUNTER MEDICATION Vit D with K     Probiotic Product (PROBIOTIC DAILY PO) Take 2 capsules by mouth daily. Gummies     tacrolimus (PROTOPIC) 0.1 % ointment Apply topically 2 (two) times daily. 100 g 3   vitamin B-12 (CYANOCOBALAMIN) 1000 MCG tablet Take 1,000 mcg by mouth daily.      acyclovir (ZOVIRAX) 800 MG tablet Take 0.5 tablets (400 mg total) by mouth 2 (two) times daily. (Patient not taking: Reported on 08/22/2023) 60 tablet 11   No current facility-administered medications for this visit.   Facility-Administered Medications Ordered in Other Visits  Medication Dose Route Frequency Provider Last Rate Last Admin   sodium chloride flush (NS) 0.9 % injection 10 mL  10 mL Intracatheter PRN Johney Maine, MD   10 mL at 05/11/19 1437    Review of Systems: GENERAL: negative for malaise, night sweats HEENT: No changes in hearing or vision, no nose bleeds or other nasal problems. NECK: Negative for lumps, goiter, pain and significant neck swelling RESPIRATORY: Negative for cough, wheezing CARDIOVASCULAR: Negative for chest pain, leg swelling, palpitations, orthopnea GI: SEE HPI MUSCULOSKELETAL: Negative for joint pain or swelling, back pain, and muscle pain. SKIN: Negative for lesions, rash PSYCH: Negative for sleep disturbance, mood disorder and recent psychosocial stressors. HEMATOLOGY Negative for prolonged bleeding, bruising easily, and swollen nodes. ENDOCRINE: Negative for cold or heat intolerance, polyuria, polydipsia and goiter. NEURO: negative for tremor, gait imbalance, syncope and seizures. The remainder of the review of systems is noncontributory.   Physical Exam: BP 126/76 (BP Location: Right Arm, Patient Position: Sitting, Cuff Size: Large)   Pulse 79   Temp 97.7 F (36.5 C) (Oral)   Ht 5\' 4"  (1.626 m)   Wt 212 lb 14.4 oz (96.6 kg)   BMI 36.54 kg/m  GENERAL: The patient is AO x3, in no acute distress. HEENT: Head is normocephalic and atraumatic.  EOMI are intact. Mouth is well hydrated and without lesions. NECK: Supple. No masses LUNGS: Clear to auscultation. No presence of rhonchi/wheezing/rales. Adequate chest expansion HEART: RRR, normal s1 and s2. ABDOMEN: Soft, nontender, no guarding, no peritoneal signs, and nondistended. BS +. No masses. EXTREMITIES: Without any cyanosis, clubbing, rash, lesions or edema. NEUROLOGIC: AOx3, no focal motor deficit. SKIN: no jaundice, no rashes  Imaging/Labs: as above  I personally reviewed and interpreted the available labs, imaging and endoscopic files.  Impression and Plan: Madison Parker is a 72 y.o. female with PMH multiple myeloma status post bone marrow transplant, who presents for evaluation of positive FOBT.  Patient has been asymptomatic and denies any complaints.  Was found to have positive FOBT with minimal drop in her baseline hemoglobin.  Notably, her iron stores have been normal.  Given her positive stool testing and history of colonic polyps, we will proceed with endoscopic evaluation of gastrointestinal losses with an EGD and colonoscopy.  Patient is agreeable to proceed with this.  -Schedule EGD and colonoscopy -Continue anemia follow-up with oncology  All questions were answered.      Katrinka Blazing, MD Gastroenterology and Hepatology [facility]

## 2023-08-27 NOTE — Anesthesia Preprocedure Evaluation (Addendum)
Anesthesia Evaluation  Patient identified by MRN, date of birth, ID band Patient awake    Reviewed: Allergy & Precautions, NPO status , Patient's Chart, lab work & pertinent test results  History of Anesthesia Complications Negative for: history of anesthetic complications  Airway Mallampati: II  TM Distance: >3 FB Neck ROM: Full    Dental no notable dental hx. (+) Dental Advisory Given, Teeth Intact   Pulmonary former smoker   Pulmonary exam normal breath sounds clear to auscultation       Cardiovascular Exercise Tolerance: Good Normal cardiovascular exam Rhythm:Regular Rate:Normal     Neuro/Psych  Headaches    GI/Hepatic negative GI ROS, Neg liver ROS,,,  Endo/Other  Hypothyroidism  Class 3 obesity  Renal/GU negative Renal ROS     Musculoskeletal  (+) Arthritis , Osteoarthritis,    Abdominal   Peds  Hematology  (+) Blood dyscrasia, anemia   Anesthesia Other Findings Multiple myeloma  Reproductive/Obstetrics                             Anesthesia Physical Anesthesia Plan  ASA: 3  Anesthesia Plan: General   Post-op Pain Management: Minimal or no pain anticipated   Induction: Intravenous  PONV Risk Score and Plan: Propofol infusion  Airway Management Planned: Nasal Cannula and Natural Airway  Additional Equipment: None  Intra-op Plan:   Post-operative Plan:   Informed Consent: I have reviewed the patients History and Physical, chart, labs and discussed the procedure including the risks, benefits and alternatives for the proposed anesthesia with the patient or authorized representative who has indicated his/her understanding and acceptance.     Dental advisory given  Plan Discussed with: CRNA  Anesthesia Plan Comments:         Anesthesia Quick Evaluation

## 2023-08-28 ENCOUNTER — Ambulatory Visit (HOSPITAL_COMMUNITY): Payer: Self-pay | Admitting: Anesthesiology

## 2023-08-28 ENCOUNTER — Ambulatory Visit (HOSPITAL_COMMUNITY)
Admission: RE | Admit: 2023-08-28 | Discharge: 2023-08-28 | Disposition: A | Discharge status: Home / Self Care | Payer: Medicare Other | Attending: Gastroenterology | Admitting: Gastroenterology

## 2023-08-28 ENCOUNTER — Other Ambulatory Visit: Payer: Self-pay

## 2023-08-28 ENCOUNTER — Ambulatory Visit (HOSPITAL_BASED_OUTPATIENT_CLINIC_OR_DEPARTMENT_OTHER): Payer: Medicare Other | Admitting: Anesthesiology

## 2023-08-28 ENCOUNTER — Encounter (HOSPITAL_COMMUNITY)
Admission: RE | Disposition: A | Discharge status: Home / Self Care | Payer: Self-pay | Source: Home / Self Care | Attending: Gastroenterology

## 2023-08-28 ENCOUNTER — Encounter (INDEPENDENT_AMBULATORY_CARE_PROVIDER_SITE_OTHER): Payer: Self-pay | Admitting: *Deleted

## 2023-08-28 DIAGNOSIS — Z860101 Personal history of adenomatous and serrated colon polyps: Secondary | ICD-10-CM

## 2023-08-28 DIAGNOSIS — Z860100 Personal history of colon polyps, unspecified: Secondary | ICD-10-CM | POA: Diagnosis not present

## 2023-08-28 DIAGNOSIS — Z9481 Bone marrow transplant status: Secondary | ICD-10-CM | POA: Diagnosis not present

## 2023-08-28 DIAGNOSIS — D649 Anemia, unspecified: Secondary | ICD-10-CM

## 2023-08-28 DIAGNOSIS — Z1211 Encounter for screening for malignant neoplasm of colon: Secondary | ICD-10-CM | POA: Diagnosis not present

## 2023-08-28 DIAGNOSIS — Z87891 Personal history of nicotine dependence: Secondary | ICD-10-CM | POA: Diagnosis not present

## 2023-08-28 DIAGNOSIS — D120 Benign neoplasm of cecum: Secondary | ICD-10-CM | POA: Insufficient documentation

## 2023-08-28 DIAGNOSIS — K648 Other hemorrhoids: Secondary | ICD-10-CM | POA: Diagnosis not present

## 2023-08-28 DIAGNOSIS — D122 Benign neoplasm of ascending colon: Secondary | ICD-10-CM | POA: Diagnosis not present

## 2023-08-28 DIAGNOSIS — Z6836 Body mass index (BMI) 36.0-36.9, adult: Secondary | ICD-10-CM | POA: Insufficient documentation

## 2023-08-28 DIAGNOSIS — D126 Benign neoplasm of colon, unspecified: Secondary | ICD-10-CM

## 2023-08-28 DIAGNOSIS — C9000 Multiple myeloma not having achieved remission: Secondary | ICD-10-CM | POA: Diagnosis not present

## 2023-08-28 DIAGNOSIS — E039 Hypothyroidism, unspecified: Secondary | ICD-10-CM | POA: Diagnosis not present

## 2023-08-28 DIAGNOSIS — K5730 Diverticulosis of large intestine without perforation or abscess without bleeding: Secondary | ICD-10-CM | POA: Diagnosis not present

## 2023-08-28 DIAGNOSIS — Z09 Encounter for follow-up examination after completed treatment for conditions other than malignant neoplasm: Secondary | ICD-10-CM | POA: Diagnosis not present

## 2023-08-28 DIAGNOSIS — K635 Polyp of colon: Secondary | ICD-10-CM | POA: Diagnosis not present

## 2023-08-28 DIAGNOSIS — R195 Other fecal abnormalities: Secondary | ICD-10-CM

## 2023-08-28 DIAGNOSIS — Z9884 Bariatric surgery status: Secondary | ICD-10-CM | POA: Diagnosis not present

## 2023-08-28 HISTORY — PX: ESOPHAGOGASTRODUODENOSCOPY (EGD) WITH PROPOFOL: SHX5813

## 2023-08-28 HISTORY — PX: POLYPECTOMY: SHX149

## 2023-08-28 HISTORY — PX: COLONOSCOPY WITH PROPOFOL: SHX5780

## 2023-08-28 LAB — HM COLONOSCOPY

## 2023-08-28 SURGERY — COLONOSCOPY WITH PROPOFOL
Anesthesia: General

## 2023-08-28 MED ORDER — LIDOCAINE HCL (PF) 2 % IJ SOLN
INTRAMUSCULAR | Status: DC | PRN
  Administered 2023-08-28: 50 mg via INTRADERMAL

## 2023-08-28 MED ORDER — PROPOFOL 500 MG/50ML IV EMUL
INTRAVENOUS | Status: DC | PRN
  Administered 2023-08-28: 150 ug/kg/min via INTRAVENOUS

## 2023-08-28 MED ORDER — PROPOFOL 10 MG/ML IV BOLUS
INTRAVENOUS | Status: DC | PRN
  Administered 2023-08-28 (×2): 50 mg via INTRAVENOUS
  Administered 2023-08-28: 100 mg via INTRAVENOUS

## 2023-08-28 MED ORDER — PHENYLEPHRINE 80 MCG/ML (10ML) SYRINGE FOR IV PUSH (FOR BLOOD PRESSURE SUPPORT)
PREFILLED_SYRINGE | INTRAVENOUS | Status: DC | PRN
  Administered 2023-08-28 (×5): 160 ug via INTRAVENOUS

## 2023-08-28 MED ORDER — LACTATED RINGERS IV SOLN: INTRAVENOUS | Status: DC | PRN

## 2023-08-28 MED ORDER — PHENYLEPHRINE 80 MCG/ML (10ML) SYRINGE FOR IV PUSH (FOR BLOOD PRESSURE SUPPORT)
PREFILLED_SYRINGE | INTRAVENOUS | Status: AC
  Filled 2023-08-28: qty 10

## 2023-08-28 NOTE — Transfer of Care (Signed)
Immediate Anesthesia Transfer of Care Note  Patient: Madison Parker  Procedure(s) Performed: COLONOSCOPY WITH PROPOFOL ESOPHAGOGASTRODUODENOSCOPY (EGD) WITH PROPOFOL POLYPECTOMY INTESTINAL  Patient Location: Endoscopy Unit  Anesthesia Type:General  Level of Consciousness: awake  Airway & Oxygen Therapy: Patient Spontanous Breathing  Post-op Assessment: Report given to RN and Post -op Vital signs reviewed and stable  Post vital signs: Reviewed and stable  Last Vitals:  Vitals Value Taken Time  BP    Temp    Pulse    Resp    SpO2      Last Pain:  Vitals:   08/28/23 0737  TempSrc:   PainSc: 0-No pain      Patients Stated Pain Goal: 4 (08/28/23 0718)  Complications: No notable events documented.

## 2023-08-28 NOTE — Anesthesia Procedure Notes (Signed)
Date/Time: 08/28/2023 7:35 AM  Performed by: Julian Reil, CRNAPre-anesthesia Checklist: Patient identified, Emergency Drugs available, Suction available and Patient being monitored Patient Re-evaluated:Patient Re-evaluated prior to induction Oxygen Delivery Method: Nasal cannula Induction Type: IV induction Placement Confirmation: positive ETCO2

## 2023-08-28 NOTE — Op Note (Signed)
[facility] Patient Name: Madison Parker Procedure Date: 08/28/2023 7:34 AM MRN: [id number] Date of Birth: April 19, 1951 Attending MD: Katrinka Blazing , , [id number] CSN: [id number] Age: 72 Admit Type: Outpatient Procedure:                Colonoscopy Indications:              Surveillance: Personal history of adenomatous                            polyps on last colonoscopy 3 years ago Providers:                Katrinka Blazing, Crystal Page, Francoise Ceo RN, RN,                            Dyann Ruddle Referring MD:              Medicines:                Monitored Anesthesia Care Complications:            No immediate complications. Estimated Blood Loss:     Estimated blood loss: none. Procedure:                Pre-Anesthesia Assessment:                           - Prior to the procedure, a History and Physical                            was performed, and patient medications, allergies                            and sensitivities were reviewed. The patient's                            tolerance of previous anesthesia was reviewed.                           - The risks and benefits of the procedure and the                            sedation options and risks were discussed with the                            patient. All questions were answered and informed                            consent was obtained.                           - ASA Grade Assessment: II - A patient with mild                            systemic disease.                           After obtaining informed consent, the colonoscope  was passed under direct vision. Throughout the                            procedure, the patient's blood pressure, pulse, and                            oxygen saturations were monitored continuously. The                            [id number]-[id number] ([id number]) scope was introduced through                            the anus and advanced to the the cecum, identified                             by appendiceal orifice and ileocecal valve. The                            colonoscopy was performed without difficulty. The                            patient tolerated the procedure well. The quality                            of the bowel preparation was good. Scope In: 7:51:09 AM Scope Out: 8:12:46 AM Scope Withdrawal Time: 0 hours 17 minutes 54 seconds  Total Procedure Duration: 0 hours 21 minutes 37 seconds  Findings:      The perianal and digital rectal examinations were normal.      Three sessile polyps were found in the ascending colon and cecum. The       polyps were 3 to 5 mm in size. These polyps were removed with a cold       snare. Resection and retrieval were complete.      Scattered small-mouthed diverticula were found in the sigmoid colon,       descending colon and ascending colon.      Non-bleeding internal hemorrhoids were found during retroflexion. The       hemorrhoids were small. Impression:               - Three 3 to 5 mm polyps in the ascending colon and                            in the cecum, removed with a cold snare. Resected                            and retrieved.                           - Diverticulosis in the sigmoid colon, in the                            descending colon and in the ascending colon.                           -  Non-bleeding internal hemorrhoids. Moderate Sedation:      Per Anesthesia Care Recommendation:           - Discharge patient to home (ambulatory).                           - Resume previous diet.                           - Await pathology results.                           - Repeat colonoscopy in 5 years for surveillance. Procedure Code(s):        --- Professional ---                           [phone], Colonoscopy, flexible; with removal of                            tumor(s), polyp(s), or other lesion(s) by snare                            technique Diagnosis Code(s):        --- Professional ---                            Z86.010, Personal history of colonic polyps                           D12.2, Benign neoplasm of ascending colon                           D12.0, Benign neoplasm of cecum                           K64.8, Other hemorrhoids                           K57.30, Diverticulosis of large intestine without                            perforation or abscess without bleeding CPT copyright 2022 American Medical Association. All rights reserved. The codes documented in this report are preliminary and upon coder review may  be revised to meet current compliance requirements. Katrinka Blazing, MD Katrinka Blazing,  08/28/2023 8:19:00 AM This report has been signed electronically. Number of Addenda: 0

## 2023-08-28 NOTE — Discharge Instructions (Addendum)
You are being discharged to home.  Resume your previous diet.  We are waiting for your pathology results.  Your physician has recommended a repeat colonoscopy in five years for surveillance.  

## 2023-08-28 NOTE — Op Note (Signed)
[facility] Patient Name: Madison Parker Procedure Date: 08/28/2023 7:35 AM MRN: [id number] Date of Birth: May 20, 1951 Attending MD: Katrinka Blazing , , [id number] CSN: [id number] Age: 72 Admit Type: Outpatient Procedure:                Upper GI endoscopy Indications:              Anemia Providers:                Katrinka Blazing, Crystal Page, Francoise Ceo RN, RN,                            Dyann Ruddle Referring MD:              Medicines:                Monitored Anesthesia Care Complications:            No immediate complications. Estimated Blood Loss:     Estimated blood loss: none. Procedure:                Pre-Anesthesia Assessment:                           - Prior to the procedure, a History and Physical                            was performed, and patient medications, allergies                            and sensitivities were reviewed. The patient's                            tolerance of previous anesthesia was reviewed.                           - The risks and benefits of the procedure and the                            sedation options and risks were discussed with the                            patient. All questions were answered and informed                            consent was obtained.                           - ASA Grade Assessment: II - A patient with mild                            systemic disease.                           After obtaining informed consent, the endoscope was                            passed under direct vision. Throughout the  procedure, the patient's blood pressure, pulse, and                            oxygen saturations were monitored continuously. The                            [id number]-[id number] ([id number]) scope was introduced through the                            mouth, and advanced to the jejunum. The upper GI                            endoscopy was accomplished without difficulty. The                             patient tolerated the procedure well. Scope In: 7:41:44 AM Scope Out: 7:45:24 AM Total Procedure Duration: 0 hours 3 minutes 40 seconds  Findings:      The examined esophagus was normal.      Evidence of a gastric bypass was found. A gastric pouch with a 6 cm       length from the GE junction to the gastrojejunal anastomosis was found.       The staple line appeared intact. The gastrojejunal anastomosis was       characterized by healthy appearing mucosa. This was traversed. The       pouch-to-jejunum limb was characterized by healthy appearing mucosa.      The examined jejunum was normal (both afferent and efferent limbs). Impression:               - Normal esophagus.                           - Gastric bypass with a pouch 6 cm in length and                            intact staple line. Gastrojejunal anastomosis                            characterized by healthy appearing mucosa.                           - Normal examined jejunum.                           - No specimens collected. Moderate Sedation:      Per Anesthesia Care Recommendation:           - Discharge patient to home (ambulatory).                           - Resume previous diet. Procedure Code(s):        --- Professional ---                           [phone], Esophagogastroduodenoscopy, flexible,  transoral; diagnostic, including collection of                            specimen(s) by brushing or washing, when performed                            (separate procedure) Diagnosis Code(s):        --- Professional ---                           W09.81, Bariatric surgery status                           D64.9, Anemia, unspecified CPT copyright 2022 American Medical Association. All rights reserved. The codes documented in this report are preliminary and upon coder review may  be revised to meet current compliance requirements. Katrinka Blazing, MD Katrinka Blazing,  08/28/2023 7:50:22 AM This  report has been signed electronically. Number of Addenda: 0

## 2023-08-28 NOTE — Anesthesia Postprocedure Evaluation (Signed)
Anesthesia Post Note  Patient: Madison Parker  Procedure(s) Performed: COLONOSCOPY WITH PROPOFOL ESOPHAGOGASTRODUODENOSCOPY (EGD) WITH PROPOFOL POLYPECTOMY INTESTINAL  Patient location during evaluation: PACU Anesthesia Type: General Level of consciousness: awake and alert Pain management: pain level controlled Vital Signs Assessment: post-procedure vital signs reviewed and stable Respiratory status: spontaneous breathing, nonlabored ventilation, respiratory function stable and patient connected to nasal cannula oxygen Cardiovascular status: blood pressure returned to baseline and stable Postop Assessment: no apparent nausea or vomiting Anesthetic complications: no   There were no known notable events for this encounter.   Last Vitals:  Vitals:   08/28/23 0718 08/28/23 0815  BP: 119/70 (!) 120/59  Pulse: 73 64  Resp: 16 (!) 25  Temp: 36.6 C 36.6 C  SpO2: 97% 95%    Last Pain:  Vitals:   08/28/23 0815  TempSrc: Oral  PainSc: 0-No pain                 Nelle Sayed L Ordean Fouts

## 2023-08-28 NOTE — Interval H&P Note (Signed)
History and Physical Interval Note:  08/28/2023 7:33 AM  Madison Parker  has presented today for surgery, with the diagnosis of hx colon polyps, anemia.  The various methods of treatment have been discussed with the patient and family. After consideration of risks, benefits and other options for treatment, the patient has consented to  Procedure(s) with comments: COLONOSCOPY WITH PROPOFOL (N/A) - 815am, asa 1-2 ESOPHAGOGASTRODUODENOSCOPY (EGD) WITH PROPOFOL (N/A) as a surgical intervention.  The patient's history has been reviewed, patient examined, no change in status, stable for surgery.  I have reviewed the patient's chart and labs.  Questions were answered to the patient's satisfaction.     Katrinka Blazing Mayorga

## 2023-08-29 LAB — SURGICAL PATHOLOGY

## 2023-09-03 ENCOUNTER — Encounter (INDEPENDENT_AMBULATORY_CARE_PROVIDER_SITE_OTHER): Payer: Self-pay | Admitting: *Deleted

## 2023-09-03 ENCOUNTER — Encounter (HOSPITAL_COMMUNITY): Payer: Self-pay | Admitting: Gastroenterology

## 2023-09-05 ENCOUNTER — Encounter: Payer: Self-pay | Admitting: Hematology

## 2023-09-05 NOTE — Telephone Encounter (Signed)
Telephone call  

## 2023-09-30 ENCOUNTER — Other Ambulatory Visit: Payer: Self-pay

## 2023-10-23 ENCOUNTER — Ambulatory Visit
Admission: EM | Admit: 2023-10-23 | Discharge: 2023-10-23 | Disposition: A | Discharge status: Home / Self Care | Payer: Medicare Other | Attending: Nurse Practitioner | Admitting: Nurse Practitioner

## 2023-10-23 ENCOUNTER — Encounter: Payer: Self-pay | Admitting: Emergency Medicine

## 2023-10-23 ENCOUNTER — Other Ambulatory Visit: Payer: Self-pay

## 2023-10-23 DIAGNOSIS — N390 Urinary tract infection, site not specified: Secondary | ICD-10-CM | POA: Insufficient documentation

## 2023-10-23 DIAGNOSIS — N898 Other specified noninflammatory disorders of vagina: Secondary | ICD-10-CM | POA: Diagnosis not present

## 2023-10-23 LAB — POCT URINALYSIS DIP (MANUAL ENTRY)
Bilirubin, UA: NEGATIVE
Blood, UA: NEGATIVE
Glucose, UA: NEGATIVE mg/dL
Ketones, POC UA: NEGATIVE mg/dL
Leukocytes, UA: NEGATIVE
Nitrite, UA: POSITIVE — AB
Protein Ur, POC: NEGATIVE mg/dL
Spec Grav, UA: 1.025 (ref 1.010–1.025)
Urobilinogen, UA: 1 U/dL
pH, UA: 6 (ref 5.0–8.0)

## 2023-10-23 MED ORDER — SULFAMETHOXAZOLE-TRIMETHOPRIM 800-160 MG PO TABS: 1.0000 | ORAL_TABLET | Freq: Two times a day (BID) | ORAL | 0 refills | Status: AC

## 2023-10-23 MED ORDER — FLUCONAZOLE 150 MG PO TABS: ORAL_TABLET | ORAL | 0 refills | Status: DC

## 2023-10-23 NOTE — ED Triage Notes (Signed)
 Pt reports lower back pain that is now radiating to lower abdomen. Pt reports "dark and white frothy urine and bowels." Reports also has lidocaine patches x2 for pain assistance with minimal change in discomfort.   Last BM this am.

## 2023-10-23 NOTE — Discharge Instructions (Signed)
 Your urinalysis shows that you do have a urinary tract infection.  A urine culture has been ordered to ensure you are being treated with the appropriate antibiotic.  If the medication needs to be changed, you will be contacted.  Cytology swab is also pending.  You will be contacted if the results are abnormal.  You will also have access to the results via MyChart. Take medication as prescribed. Continue over-the-counter Tylenol  as needed for pain, fever, or general discomfort. Make sure you are drinking plenty of fluids.  Try to drink at least 8-10 8 ounce glasses of water  while symptoms persist. Develop a toileting schedule that will allow you to urinate every 2 hours. Avoid caffeine such as tea, soda, and coffee while symptoms persist. As discussed, if you are continuing to experience low back pain after being treated for a UTI, please follow-up with your oncologist as soon as possible for further evaluation. Go to the emergency department immediately if you experience fever, chills, worsening low back pain, worsening numbness, tingling, loss of bowel or bladder function, or become unable to walk. Follow-up as needed.

## 2023-10-23 NOTE — ED Provider Notes (Signed)
 [facility]    CSN: [id number] Arrival date & time: 10/23/23  0808      History   Chief Complaint Chief Complaint  Patient presents with   Back Pain    HPI Madison Parker is a 73 y.o. female.   The history is provided by the patient.   Patient presents for complaints of low back pain, lower pelvic pain, and dark and white frothy urine.  Patient states symptoms have been present for the past several days.  She also complains of vaginal irritation and itching that is been present for the past several months after she received the shingles vaccine..  Patient states that she was treated for UTI back in October, but states that her symptoms returned.  She states that the pain in her low back radiates to her lower abdomen, and states it was so bad this morning, that she experienced some numbness and tingling, and had difficulty moving.  She denies fever, chills, chest pain, dysuria, urinary frequency, urgency, hematuria, decreased urine stream, flank pain, vaginal odor, lower extremity weakness, frequent falls, or loss of bowel or bladder function..  Patient reports that she does have a history of multiple myeloma.  States that she did apply lidocaine  patches to her back this morning for pain.  Patient states she recently had a stem cell transplant.  States that she is scheduled to see the stem cell oncologist this week, and has her regular appointment with her oncologist on the 27th of this month.  Past Medical History:  Diagnosis Date   Headache    Low back pain    SBO (small bowel obstruction) (HCC) 2010   Smoldering multiple myeloma     Patient Active Problem List   Diagnosis Date Noted   History of colon polyps 08/28/2023   Positive fecal occult blood test 08/22/2023   History of colonic polyps 08/22/2023   Port-A-Cath in place 04/27/2019   Multiple myeloma not having achieved remission (HCC) 04/16/2019   Counseling regarding advance care planning and goals of care  04/16/2019   Primary osteoarthritis of left hip 08/16/2017   Hypothyroidism 11/08/2016   Smoldering multiple myeloma 11/08/2016   H/O bariatric surgery 02/02/2016   Degenerative joint disease of left hip 01/18/2016   Anemia 11/22/2015   Left hip pain 11/22/2015   Left shoulder pain 11/22/2015   Lumbar spondylosis 11/22/2015    Past Surgical History:  Procedure Laterality Date   ABDOMINAL HYSTERECTOMY     complete   BACK SURGERY     lower at [facility]   COLONOSCOPY WITH PROPOFOL  N/A 08/23/2020   Procedure: COLONOSCOPY WITH PROPOFOL ;  Surgeon: Eartha Angelia Sieving, MD;  Location: [facility];  Service: Gastroenterology;  Laterality: N/A;  900   COLONOSCOPY WITH PROPOFOL  N/A 08/28/2023   Procedure: COLONOSCOPY WITH PROPOFOL ;  Surgeon: Eartha Angelia Sieving, MD;  Location: [facility];  Service: Gastroenterology;  Laterality: N/A;  815am, asa 1-2   colonscopy  2014   ESOPHAGOGASTRODUODENOSCOPY (EGD) WITH PROPOFOL  N/A 08/28/2023   Procedure: ESOPHAGOGASTRODUODENOSCOPY (EGD) WITH PROPOFOL ;  Surgeon: Eartha Angelia Sieving, MD;  Location: [facility];  Service: Gastroenterology;  Laterality: N/A;   GASTRIC BYPASS  yrs ago   HERNIA REPAIR     IR IMAGING GUIDED PORT INSERTION  04/20/2019   KNEE SURGERY  06/04/2009   both knees replaced    POLYPECTOMY  08/23/2020   Procedure: POLYPECTOMY;  Surgeon: Eartha Angelia Sieving, MD;  Location: [facility];  Service: Gastroenterology;;   POLYPECTOMY  08/28/2023   Procedure: POLYPECTOMY INTESTINAL;  Surgeon: Eartha Angelia Sieving, MD;  Location: [facility];  Service: Gastroenterology;;   TONSILLECTOMY  age 63   TOTAL HIP ARTHROPLASTY Left 08/16/2017   Procedure: LEFT TOTAL HIP ARTHROPLASTY ANTERIOR APPROACH;  Surgeon: Yvone Rush, MD;  Location: [facility];  Service: Orthopedics;  Laterality: Left;    OB History   No obstetric history on file.      Home Medications    Prior to Admission medications   Medication Sig  Start Date End Date Taking? Authorizing Provider  fluconazole  (DIFLUCAN ) 150 MG tablet Take 1 tablet by mouth today.  May repeat every 3 days up to 2 additional doses for continued symptoms. 10/23/23  Yes Leath-Warren, Etta PARAS, NP  sulfamethoxazole -trimethoprim  (BACTRIM  DS) 800-160 MG tablet Take 1 tablet by mouth 2 (two) times daily for 7 days. 10/23/23 10/30/23 Yes Leath-Warren, Etta PARAS, NP  acetaminophen  (TYLENOL  8 HOUR) 650 MG CR tablet Take 650 mg by mouth every 8 (eight) hours as needed for pain.    [provider]  clobetasol  cream (TEMOVATE ) 0.05 % Apply 1 Application topically 2 (two) times daily. USE FOR 2 WEEKS then STOP 03/19/23   Alm Delon SAILOR, DO  fluticasone (CUTIVATE) 0.05 % cream Apply 1 Application topically daily. 08/27/22   [provider]  lidocaine -prilocaine  (EMLA ) cream Apply 1 application. topically as needed. 12/27/21   Onesimo Emaline Brink, MD  nystatin -triamcinolone  (MYCOLOG II) cream Apply 1 Application topically 2 (two) times daily.    [provider]  OVER THE COUNTER MEDICATION Vit D with K    [provider]  Probiotic Product (PROBIOTIC DAILY PO) Take 2 capsules by mouth daily. Gummies    [provider]  tacrolimus  (PROTOPIC ) 0.1 % ointment Apply topically 2 (two) times daily. 03/19/23   Alm Delon SAILOR, DO  vitamin B-12 (CYANOCOBALAMIN ) 1000 MCG tablet Take 1,000 mcg by mouth daily.     [provider]    Family History Family History  Problem Relation Age of Onset   Breast cancer Neg Hx     Social History Social History   Tobacco Use   Smoking status: Former    Current packs/day: 0.50    Average packs/day: 0.5 packs/day for 10.0 years (5.0 ttl pk-yrs)    Types: Cigarettes   Smokeless tobacco: Never   Tobacco comments:    quit 1977  Vaping Use   Vaping status: Never Used  Substance Use Topics   Alcohol use: Yes    Comment: occ   Drug use: No     Allergies   Codeine   Review of  Systems Review of Systems Per HPI  Physical Exam Triage Vital Signs ED Triage Vitals [10/23/23 0820]  Encounter Vitals Group     BP      Systolic BP Percentile      Diastolic BP Percentile      Pulse      Resp      Temp      Temp src      SpO2      Weight      Height      Head Circumference      Peak Flow      Pain Score 8     Pain Loc      Pain Education      Exclude from Growth Chart    No data found.  Updated Vital Signs BP 129/85 (BP Location: Right Arm)   Pulse 69  Temp 97.8 F (36.6 C) (Oral)   Resp 20   SpO2 97%   Visual Acuity Right Eye Distance:   Left Eye Distance:   Bilateral Distance:    Right Eye Near:   Left Eye Near:    Bilateral Near:     Physical Exam Vitals and nursing note reviewed.  Constitutional:      General: She is not in acute distress.    Appearance: Normal appearance.  HENT:     Head: Normocephalic.  Eyes:     Extraocular Movements: Extraocular movements intact.     Conjunctiva/sclera: Conjunctivae normal.     Pupils: Pupils are equal, round, and reactive to light.  Cardiovascular:     Rate and Rhythm: Normal rate and regular rhythm.     Pulses: Normal pulses.     Heart sounds: Normal heart sounds.  Pulmonary:     Effort: Pulmonary effort is normal. No respiratory distress.     Breath sounds: Normal breath sounds. No stridor. No wheezing, rhonchi or rales.  Abdominal:     General: Bowel sounds are normal.     Palpations: Abdomen is soft.     Tenderness: There is abdominal tenderness in the suprapubic area.  Musculoskeletal:     Cervical back: Normal range of motion.  Lymphadenopathy:     Cervical: No cervical adenopathy.  Skin:    General: Skin is warm and dry.  Neurological:     General: No focal deficit present.     Mental Status: She is alert and oriented to person, place, and time.  Psychiatric:        Mood and Affect: Mood normal.        Behavior: Behavior normal.      UC Treatments / Results   Labs (all labs ordered are listed, but only abnormal results are displayed) Labs Reviewed  POCT URINALYSIS DIP (MANUAL ENTRY) - Abnormal; Notable for the following components:      Result Value   Clarity, UA cloudy (*)    Nitrite, UA Positive (*)    All other components within normal limits  URINE CULTURE  CERVICOVAGINAL ANCILLARY ONLY    EKG   Radiology No results found.  Procedures Procedures (including critical care time)  Medications Ordered in UC Medications - No data to display  Initial Impression / Assessment and Plan / UC Course  I have reviewed the triage vital signs and the nursing notes.  Pertinent labs & imaging results that were available during my care of the patient were reviewed by me and considered in my medical decision making (see chart for details).  On exam, room air sats are at 97%, lung sounds are clear throughout.  Patient does have some bilateral CVA tenderness.  Urinalysis is positive for nitrates.  Will treat for UTI with Bactrim  DS to cover for both UTI and pyelonephritis.  Urine culture is pending.  For her vaginal complaints, cytology swab is pending, fluconazole  150 mg tablets were prescribed.  Discussion with patient regarding complaints regarding her low back pain.  Advised patient that the numbness and tingling is a concern, and does not usually accompany UTIs or kidney infections.  Patient was advised to discuss this with her oncologist at her upcoming appointments, specifically if she is still experiencing the pain after being treated for UTI.  Supportive care recommendations were provided and discussed with the patient to include fluids, rest, over-the-counter Tylenol , voiding every 2 hours, and avoiding caffeine.  Discussed ER follow-up precautions with the patient to  include fever, chills, worsening low back pain, loss of bowel or bladder function, falls, or worsening numbness or tingling in her lower extremities.  Patient was in agreement with  this plan of care and verbalizes understanding.  All questions were answered.  Patient stable for discharge.  Final Clinical Impressions(s) / UC Diagnoses   Final diagnoses:  Urinary tract infection without hematuria, site unspecified  Vaginal irritation     Discharge Instructions      Your urinalysis shows that you do have a urinary tract infection.  A urine culture has been ordered to ensure you are being treated with the appropriate antibiotic.  If the medication needs to be changed, you will be contacted.  Cytology swab is also pending.  You will be contacted if the results are abnormal.  You will also have access to the results via MyChart. Take medication as prescribed. Continue over-the-counter Tylenol  as needed for pain, fever, or general discomfort. Make sure you are drinking plenty of fluids.  Try to drink at least 8-10 8 ounce glasses of water  while symptoms persist. Develop a toileting schedule that will allow you to urinate every 2 hours. Avoid caffeine such as tea, soda, and coffee while symptoms persist. As discussed, if you are continuing to experience low back pain after being treated for a UTI, please follow-up with your oncologist as soon as possible for further evaluation. Go to the emergency department immediately if you experience fever, chills, worsening low back pain, worsening numbness, tingling, loss of bowel or bladder function, or become unable to walk. Follow-up as needed.     ED Prescriptions     Medication Sig Dispense Auth. Provider   sulfamethoxazole -trimethoprim  (BACTRIM  DS) 800-160 MG tablet Take 1 tablet by mouth 2 (two) times daily for 7 days. 14 tablet Leath-Warren, Etta PARAS, NP   fluconazole  (DIFLUCAN ) 150 MG tablet Take 1 tablet by mouth today.  May repeat every 3 days up to 2 additional doses for continued symptoms. 3 tablet Leath-Warren, Etta PARAS, NP      PDMP not reviewed this encounter.   Gilmer Etta PARAS, NP 10/23/23  [phone]

## 2023-10-24 LAB — CERVICOVAGINAL ANCILLARY ONLY
Bacterial Vaginitis (gardnerella): NEGATIVE
Candida Glabrata: NEGATIVE
Candida Vaginitis: NEGATIVE
Comment: NEGATIVE
Comment: NEGATIVE
Comment: NEGATIVE

## 2023-10-25 DIAGNOSIS — Z9484 Stem cells transplant status: Secondary | ICD-10-CM | POA: Diagnosis not present

## 2023-10-25 DIAGNOSIS — C9000 Multiple myeloma not having achieved remission: Secondary | ICD-10-CM | POA: Diagnosis not present

## 2023-10-25 DIAGNOSIS — C9001 Multiple myeloma in remission: Secondary | ICD-10-CM | POA: Diagnosis not present

## 2023-10-25 LAB — URINE CULTURE: Culture: >=100000 — AB

## 2023-11-08 ENCOUNTER — Other Ambulatory Visit: Payer: Self-pay

## 2023-11-08 DIAGNOSIS — C9001 Multiple myeloma in remission: Secondary | ICD-10-CM

## 2023-11-11 ENCOUNTER — Inpatient Hospital Stay: Payer: Medicare Other | Attending: Hematology

## 2023-11-11 ENCOUNTER — Inpatient Hospital Stay (HOSPITAL_BASED_OUTPATIENT_CLINIC_OR_DEPARTMENT_OTHER): Payer: Medicare Other | Admitting: Hematology

## 2023-11-11 VITALS — BP 130/80 | HR 75 | Temp 97.7°F | Resp 18 | Ht 64.0 in | Wt 210.3 lb

## 2023-11-11 DIAGNOSIS — Z86718 Personal history of other venous thrombosis and embolism: Secondary | ICD-10-CM | POA: Insufficient documentation

## 2023-11-11 DIAGNOSIS — Z452 Encounter for adjustment and management of vascular access device: Secondary | ICD-10-CM | POA: Insufficient documentation

## 2023-11-11 DIAGNOSIS — G629 Polyneuropathy, unspecified: Secondary | ICD-10-CM | POA: Insufficient documentation

## 2023-11-11 DIAGNOSIS — C9001 Multiple myeloma in remission: Secondary | ICD-10-CM

## 2023-11-11 DIAGNOSIS — M8580 Other specified disorders of bone density and structure, unspecified site: Secondary | ICD-10-CM | POA: Insufficient documentation

## 2023-11-11 DIAGNOSIS — D649 Anemia, unspecified: Secondary | ICD-10-CM | POA: Diagnosis not present

## 2023-11-11 DIAGNOSIS — D72819 Decreased white blood cell count, unspecified: Secondary | ICD-10-CM | POA: Insufficient documentation

## 2023-11-11 DIAGNOSIS — C9000 Multiple myeloma not having achieved remission: Secondary | ICD-10-CM

## 2023-11-11 DIAGNOSIS — Z95828 Presence of other vascular implants and grafts: Secondary | ICD-10-CM

## 2023-11-11 LAB — CBC WITH DIFFERENTIAL (CANCER CENTER ONLY)
Abs Immature Granulocytes: 0.01 10*3/uL (ref 0.00–0.07)
Basophils Absolute: 0 10*3/uL (ref 0.0–0.1)
Basophils Relative: 1 %
Eosinophils Absolute: 0 10*3/uL (ref 0.0–0.5)
Eosinophils Relative: 1 %
HCT: 35.4 % — ABNORMAL LOW (ref 36.0–46.0)
Hemoglobin: 11.8 g/dL — ABNORMAL LOW (ref 12.0–15.0)
Immature Granulocytes: 0 %
Lymphocytes Relative: 50 %
Lymphs Abs: 2.1 10*3/uL (ref 0.7–4.0)
MCH: 31.5 pg (ref 26.0–34.0)
MCHC: 33.3 g/dL (ref 30.0–36.0)
MCV: 94.4 fL (ref 80.0–100.0)
Monocytes Absolute: 0.4 10*3/uL (ref 0.1–1.0)
Monocytes Relative: 9 %
Neutro Abs: 1.6 10*3/uL — ABNORMAL LOW (ref 1.7–7.7)
Neutrophils Relative %: 39 %
Platelet Count: 180 10*3/uL (ref 150–400)
RBC: 3.75 MIL/uL — ABNORMAL LOW (ref 3.87–5.11)
RDW: 11.7 % (ref 11.5–15.5)
WBC Count: 4.2 10*3/uL (ref 4.0–10.5)
nRBC: 0 % (ref 0.0–0.2)

## 2023-11-11 LAB — IRON AND IRON BINDING CAPACITY (CC-WL,HP ONLY)
Iron: 71 ug/dL (ref 28–170)
Saturation Ratios: 21 % (ref 10.4–31.8)
TIBC: 332 ug/dL (ref 250–450)
UIBC: 261 ug/dL (ref 148–442)

## 2023-11-11 LAB — FERRITIN: Ferritin: 56 ng/mL (ref 11–307)

## 2023-11-11 MED ORDER — SODIUM CHLORIDE 0.9% FLUSH
10.0000 mL | Freq: Once | INTRAVENOUS | Status: AC
  Administered 2023-11-11: 10 mL

## 2023-11-11 MED ORDER — TRIMETHOPRIM 100 MG PO TABS: 100.0000 mg | ORAL_TABLET | Freq: Every day | ORAL | 0 refills | Status: DC

## 2023-11-11 MED ORDER — HEPARIN SOD (PORK) LOCK FLUSH 100 UNIT/ML IV SOLN
500.0000 [IU] | Freq: Once | INTRAVENOUS | Status: AC
  Administered 2023-11-11: 500 [IU]

## 2023-11-11 NOTE — Progress Notes (Signed)
HEMATOLOGY/ONCOLOGY CLINIC NOTE  Date of Service: 11/11/2023   PCP: Patriciaann Clan MD  CHIEF COMPLAINTS/PURPOSE OF CONSULTATION:  Follow-up for continued evaluation and management of multiple myeloma  HISTORY OF PRESENTING ILLNESS:  See previous note for details on initial presentation  INTERVAL HISTORY:  Madison Parker is a 73 y.o. female who is here to continue evaluation and management of multiple myeloma.  Patient was last seen by me on 08/08/2023 and she complained of left leg weakness, but was doing well overall.   Patient notes she has been doing well overall since our last visit. She does complain of lower back pain. Patent reports she recently went to Urgent care where she had urine culture, which showed UTI. She was prescribed Fluconazole and Bactrim on 10/23/2023. Patient notes that this is her second UTI. She notes that her Fluconazole only helps her UTI symptoms for a while.   During this visit, she complains of similar urinary symptoms with itchiness around her pelvic area.   She regularly follows-up with her Gastroenterologist. She had endoscopy with her Gastroenterologist.   She denies fever, chills, night sweats, unexpected weight loss, abdominal pain, chest pain, or leg swelling.   MEDICAL HISTORY:  Past Medical History:  Diagnosis Date   Headache    Low back pain    SBO (small bowel obstruction) (HCC) 2010   Smoldering multiple myeloma   Previous history of hypothyroidism- not currently medications Anxiety Obesity .There is no height or weight on file to calculate BMI. 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 Lumbosacral radiculopathy Left hip pain  SURGICAL HISTORY: Past Surgical History:  Procedure Laterality Date   ABDOMINAL HYSTERECTOMY     complete   BACK SURGERY     lower at [facility]   COLONOSCOPY WITH PROPOFOL N/A 08/23/2020   Procedure: COLONOSCOPY WITH PROPOFOL;  Surgeon:  Dolores Frame, MD;  Location: [facility];  Service: Gastroenterology;  Laterality: N/A;  900   COLONOSCOPY WITH PROPOFOL N/A 08/28/2023   Procedure: COLONOSCOPY WITH PROPOFOL;  Surgeon: Dolores Frame, MD;  Location: [facility];  Service: Gastroenterology;  Laterality: N/A;  815am, asa 1-2   colonscopy  2014   ESOPHAGOGASTRODUODENOSCOPY (EGD) WITH PROPOFOL N/A 08/28/2023   Procedure: ESOPHAGOGASTRODUODENOSCOPY (EGD) WITH PROPOFOL;  Surgeon: Dolores Frame, MD;  Location: [facility];  Service: Gastroenterology;  Laterality: N/A;   GASTRIC BYPASS  yrs ago   HERNIA REPAIR     IR IMAGING GUIDED PORT INSERTION  04/20/2019   KNEE SURGERY  06/04/2009   both knees replaced    POLYPECTOMY  08/23/2020   Procedure: POLYPECTOMY;  Surgeon: Dolores Frame, MD;  Location: [facility];  Service: Gastroenterology;;   POLYPECTOMY  08/28/2023   Procedure: POLYPECTOMY INTESTINAL;  Surgeon: Dolores Frame, MD;  Location: [facility];  Service: Gastroenterology;;   TONSILLECTOMY  age 60   TOTAL HIP ARTHROPLASTY Left 08/16/2017   Procedure: LEFT TOTAL HIP ARTHROPLASTY ANTERIOR APPROACH;  Surgeon: Jodi Geralds, MD;  Location: [facility];  Service: Orthopedics;  Laterality: Left;  Spinal surgery April 2017  SOCIAL HISTORY: Social History   Socioeconomic History   Marital status: Married    Spouse name: Not on file   Number of children: Not on file   Years of education: Not on file   Highest education level: Not on file  Occupational History   Not on file  Tobacco Use   Smoking status: Former  Current packs/day: 0.50    Average packs/day: 0.5 packs/day for 10.0 years (5.0 ttl pk-yrs)    Types: Cigarettes   Smokeless tobacco: Never   Tobacco comments:    quit 1977  Vaping Use   Vaping status: Never Used  Substance and Sexual Activity   Alcohol use: Yes    Comment: occ   Drug use: No   Sexual activity: Not on file  Other Topics Concern    Not on file  Social History Narrative   Not on file   Social Drivers of Health   Financial Resource Strain: Not on file  Food Insecurity: Not on file  Transportation Needs: Not on file  Physical Activity: Not on file  Stress: Not on file  Social Connections: Not on file  Intimate Partner Violence: Not At Risk (03/22/2021)   Humiliation, Afraid, Rape, and Kick questionnaire    Fear of Current or Ex-Partner: No    Emotionally Abused: No    Physically Abused: No    Sexually Abused: No  Occasional alcohol use Former smoker and smoked 1 pack per week for about 9 years has since quit.  FAMILY HISTORY: Family History  Problem Relation Age of Onset   Breast cancer Neg Hx     ALLERGIES:  is allergic to codeine.  MEDICATIONS:  Current Outpatient Medications  Medication Sig Dispense Refill   acetaminophen (TYLENOL 8 HOUR) 650 MG CR tablet Take 650 mg by mouth every 8 (eight) hours as needed for pain.     clobetasol cream (TEMOVATE) 0.05 % Apply 1 Application topically 2 (two) times daily. USE FOR 2 WEEKS then STOP 45 g 3   fluconazole (DIFLUCAN) 150 MG tablet Take 1 tablet by mouth today.  May repeat every 3 days up to 2 additional doses for continued symptoms. 3 tablet 0   fluticasone (CUTIVATE) 0.05 % cream Apply 1 Application topically daily.     lidocaine-prilocaine (EMLA) cream Apply 1 application. topically as needed. 30 g 2   nystatin-triamcinolone (MYCOLOG II) cream Apply 1 Application topically 2 (two) times daily.     OVER THE COUNTER MEDICATION Vit D with K     Probiotic Product (PROBIOTIC DAILY PO) Take 2 capsules by mouth daily. Gummies     tacrolimus (PROTOPIC) 0.1 % ointment Apply topically 2 (two) times daily. 100 g 3   vitamin B-12 (CYANOCOBALAMIN) 1000 MCG tablet Take 1,000 mcg by mouth daily.      No current facility-administered medications for this visit.   Facility-Administered Medications Ordered in Other Visits  Medication Dose Route Frequency Provider Last  Rate Last Admin   sodium chloride flush (NS) 0.9 % injection 10 mL  10 mL Intracatheter PRN Johney Maine, MD   10 mL at 05/11/19 1437    REVIEW OF SYSTEMS:   10 Point review of Systems was done is negative except as noted above.   PHYSICAL EXAMINATION:  .BP 130/80 (BP Location: Right Arm, Patient Position: Sitting)   Pulse 75   Temp 97.7 F (36.5 C) (Tympanic)   Resp 18   Ht 5\' 4"  (1.626 m)   Wt 210 lb 4.8 oz (95.4 kg)   SpO2 99%   BMI 36.10 kg/m   GENERAL:alert, in no acute distress and comfortable SKIN: no acute rashes, no significant lesions EYES: conjunctiva are pink and non-injected, sclera anicteric OROPHARYNX: MMM, no exudates, no oropharyngeal erythema or ulceration NECK: supple, no JVD LYMPH:  no palpable lymphadenopathy in the cervical, axillary or inguinal regions LUNGS: clear to  auscultation b/l with normal respiratory effort HEART: regular rate & rhythm ABDOMEN:  normoactive bowel sounds , non tender, not distended. Extremity: no pedal edema PSYCH: alert & oriented x 3 with fluent speech NEURO: no focal motor/sensory deficits  LABORATORY DATA:  I have reviewed the data as listed  .    Latest Ref Rng & Units 11/11/2023   10:02 AM 08/12/2023   10:09 AM 05/29/2023   10:35 AM  CBC  WBC 4.0 - 10.5 K/uL 4.2  4.4  5.0   Hemoglobin 12.0 - 15.0 g/dL 09.8  11.9  14.7   Hematocrit 36.0 - 46.0 % 35.4  34.5  33.9   Platelets 150 - 400 K/uL 180  175  172    CBC    Component Value Date/Time   WBC 4.2 11/11/2023 1002   WBC 3.3 (L) 10/04/2021 0902   RBC 3.75 (L) 11/11/2023 1002   HGB 11.8 (L) 11/11/2023 1002   HGB 11.3 (L) 09/30/2017 1145   HCT 35.4 (L) 11/11/2023 1002   HCT 35.1 09/30/2017 1145   PLT 180 11/11/2023 1002   PLT 172 09/30/2017 1145   MCV 94.4 11/11/2023 1002   MCV 97.5 09/30/2017 1145   MCH 31.5 11/11/2023 1002   MCHC 33.3 11/11/2023 1002   RDW 11.7 11/11/2023 1002   RDW 14.0 09/30/2017 1145   LYMPHSABS 2.1 11/11/2023 1002    LYMPHSABS 1.3 09/30/2017 1145   MONOABS 0.4 11/11/2023 1002   MONOABS 0.2 09/30/2017 1145   EOSABS 0.0 11/11/2023 1002   EOSABS 0.0 09/30/2017 1145   EOSABS 0.1 06/03/2014 1003   BASOSABS 0.0 11/11/2023 1002   BASOSABS 0.0 09/30/2017 1145     .    Latest Ref Rng & Units 08/12/2023   10:09 AM 05/29/2023   10:35 AM 03/06/2023   10:24 AM  CMP  Glucose 70 - 99 mg/dL 85  86  87   BUN 8 - 23 mg/dL 11  15  11    Creatinine 0.44 - 1.00 mg/dL 8.29  5.62  1.30   Sodium 135 - 145 mmol/L 138  140  139   Potassium 3.5 - 5.1 mmol/L 4.0  3.8  4.0   Chloride 98 - 111 mmol/L 108  111  108   CO2 22 - 32 mmol/L 25  25  25    Calcium 8.9 - 10.3 mg/dL 9.4  8.5  8.9   Total Protein 6.5 - 8.1 g/dL 6.7  6.6  6.7   Total Bilirubin 0.3 - 1.2 mg/dL 0.8  0.7  1.0   Alkaline Phos 38 - 126 U/L 76  77  71   AST 15 - 41 U/L 19  19  26    ALT 0 - 44 U/L 20  24  29     03/11/19 BM Bx:   03/11/19 Cytogenetics:            RADIOGRAPHIC STUDIES: I have personally reviewed the radiological images as listed and agreed with the findings in the report. No results found.   Bone survey 11/08/2016 IMPRESSION: 1. Small lytic lesion in the right scapula. 2. Possible small lytic lesion in the left scapula. 3. Postoperative changes. 4. Significant degenerative changes in both hips, left greater than right. 5. No evidence for acute fracture     Electronically Signed   By: Norva Pavlov M.D.   On: 11/08/2016 16:19   ASSESSMENT & PLAN:   73 y.o. very pleasant lady with history of   1) R-ISS 3 light chain multiple myeloma (  concern for small lytic lesions in left and right scapulae) - (appears light chain producing) and Progressive anemia This was apparently diagnosed as smoldering myeloma in 2011 by her hematologist in [place]. No renal failure/hypercalcemia. Bone survey with concern for possible small lytic lesions in B/L scapulae. PET/CT scan on 03/12/2017 showed no concerning bone  lesions. Initial Bm Bx with 17% kappa restricted plasma cells consistent with plasma cell neoplasm. No treatment so far. 03/11/19 BM Bx revealed involvement by 50% plasma cells; genetics -high risk t(14;16), previous genetics from April 2018 BM Bx were standard risk 03/16/19 MRI Lumbar reveals several explanations for her lower back pain including L2-L3 stenosis, but reassuringly, there was not evidence of involvement by multiple myeloma 04/01/19 PET/CT which revealed no FDG evidence of active multiple myeloma within the skeleton. No evidence of lytic lesions within the skeleton or soft tissue Plasmacytoma. 05/07/2019 DXA scan revealed osteopenia, T-score of -1.2.  2) Chronic leukopenia - ? Related to SMM vs other nutritional deficiencies (h/o gastric bypass surgery put her at risk for nutritional deficiencies).  WBC counts of have been close to normal recently today at 3.3k with an ANC of 1200. B12 levels WNL Ferritin adequate ?additional factor -recent NSAID use. ?element of benign ethnic neutropenia. Has not had any issues with frequent infection.   3) Neuropathy/radiculopathy -Continue follow up with orthopedics  4) history of superficial Venous Thrombosis of the left small saphenous vein  06/24/2019 US venous left : Right: There is no evidence of deep vein thrombosis in the lower extremity. No cystic structure found in the popliteal fossa. Left: Findings consistent with acute superficial vein thrombosis involving the left small saphenous vein. There is no evidence of deep vein thrombosis in the lower extremity.   PLAN: -Patient notes no acute new clinical concerns suggestive of myeloma progression at this time. -Patient has no lab or clinical evidence of myeloma progression at this time and continues to desire staying off maintenance treatments at this time. -Discussed lab results from today, 11/11/2023, in detail with the patient. CBC shows patient is anemic with hemoglobin of 11.8 g/dL  with hematocrit of 08.6%, but stable overall.  -Discussed Kappa/Lambda light chain results from 10/25/2023. Showed stable kappa/lambda ratio. -Discussed the option of getting referred to Urologist. Patient agrees.  -Refer the patient to Urologist.  -Discussed the option of starting Trimethoprim once daily for UTI prophylaxis for a month until she visits Urologist.  -Answered all of patient's questions.  -Recommend influenza vaccine, COVID-19 Booster, RSV vaccine, and other age-related vaccines.    FOLLOW-UP: Portflush and labs and MD visit in 3 months Urology referral for recurrent UTI  The total time spent in the appointment was 30 minutes* .  All of the patient's questions were answered with apparent satisfaction. The patient knows to call the clinic with any problems, questions or concerns.   Wyvonnia Lora MD MS AAHIVMS [facility] Hematology/Oncology Physician [facility]  .*Total Encounter Time as defined by the Centers for Medicare and Medicaid Services includes, in addition to the face-to-face time of a patient visit (documented in the note above) non-face-to-face time: obtaining and reviewing outside history, ordering and reviewing medications, tests or procedures, care coordination (communications with other health care professionals or caregivers) and documentation in the medical record.   I,Param Shah,acting as a Neurosurgeon for Wyvonnia Lora, MD.,have documented all relevant documentation on the behalf of Wyvonnia Lora, MD,as directed by  Wyvonnia Lora, MD while in the presence of Wyvonnia Lora, MD.  .I  have reviewed the above documentation for accuracy and completeness, and I agree with the above. Johney Maine MD

## 2023-11-12 ENCOUNTER — Telehealth: Payer: Self-pay | Admitting: Hematology

## 2023-11-12 LAB — KAPPA/LAMBDA LIGHT CHAINS
Kappa free light chain: 24 mg/L — ABNORMAL HIGH (ref 3.3–19.4)
Kappa, lambda light chain ratio: 1.4 (ref 0.26–1.65)
Lambda free light chains: 17.2 mg/L (ref 5.7–26.3)

## 2023-11-12 NOTE — Telephone Encounter (Signed)
Spoke with patient confirming upcoming appointment

## 2023-11-13 ENCOUNTER — Other Ambulatory Visit: Payer: Self-pay

## 2023-11-14 LAB — MULTIPLE MYELOMA PANEL, SERUM
Albumin SerPl Elph-Mcnc: 3.4 g/dL (ref 2.9–4.4)
Albumin/Glob SerPl: 1.1 (ref 0.7–1.7)
Alpha 1: 0.3 g/dL (ref 0.0–0.4)
Alpha2 Glob SerPl Elph-Mcnc: 0.7 g/dL (ref 0.4–1.0)
B-Globulin SerPl Elph-Mcnc: 1.1 g/dL (ref 0.7–1.3)
Gamma Glob SerPl Elph-Mcnc: 1.1 g/dL (ref 0.4–1.8)
Globulin, Total: 3.2 g/dL (ref 2.2–3.9)
IgA: 151 mg/dL (ref 64–422)
IgG (Immunoglobin G), Serum: 1184 mg/dL (ref 586–1602)
IgM (Immunoglobulin M), Srm: 42 mg/dL (ref 26–217)
Total Protein ELP: 6.6 g/dL (ref 6.0–8.5)

## 2023-11-17 ENCOUNTER — Encounter: Payer: Self-pay | Admitting: Hematology

## 2023-12-04 ENCOUNTER — Other Ambulatory Visit: Payer: Self-pay

## 2023-12-04 DIAGNOSIS — C9001 Multiple myeloma in remission: Secondary | ICD-10-CM

## 2023-12-05 NOTE — Progress Notes (Signed)
 Referral faxed to [facility] per Dr Candise Che

## 2023-12-17 ENCOUNTER — Other Ambulatory Visit: Payer: Self-pay

## 2023-12-18 ENCOUNTER — Other Ambulatory Visit: Payer: Self-pay | Admitting: Internal Medicine

## 2023-12-18 DIAGNOSIS — Z1231 Encounter for screening mammogram for malignant neoplasm of breast: Secondary | ICD-10-CM

## 2024-01-08 DIAGNOSIS — R351 Nocturia: Secondary | ICD-10-CM | POA: Diagnosis not present

## 2024-01-08 DIAGNOSIS — N390 Urinary tract infection, site not specified: Secondary | ICD-10-CM | POA: Diagnosis not present

## 2024-01-08 DIAGNOSIS — B390 Acute pulmonary histoplasmosis capsulati: Secondary | ICD-10-CM | POA: Diagnosis not present

## 2024-01-15 ENCOUNTER — Ambulatory Visit
Admission: RE | Admit: 2024-01-15 | Discharge: 2024-01-15 | Disposition: A | Discharge status: Home / Self Care | Source: Ambulatory Visit | Attending: Internal Medicine | Admitting: Internal Medicine

## 2024-01-15 ENCOUNTER — Encounter: Payer: Self-pay | Admitting: Hematology

## 2024-01-15 DIAGNOSIS — Z1231 Encounter for screening mammogram for malignant neoplasm of breast: Secondary | ICD-10-CM | POA: Diagnosis not present

## 2024-01-27 ENCOUNTER — Ambulatory Visit (HOSPITAL_BASED_OUTPATIENT_CLINIC_OR_DEPARTMENT_OTHER): Admitting: Certified Nurse Midwife

## 2024-01-27 ENCOUNTER — Encounter (HOSPITAL_BASED_OUTPATIENT_CLINIC_OR_DEPARTMENT_OTHER): Payer: Self-pay | Admitting: Certified Nurse Midwife

## 2024-01-27 VITALS — BP 146/80 | HR 78 | Ht 61.0 in | Wt 209.4 lb

## 2024-01-27 DIAGNOSIS — R8290 Unspecified abnormal findings in urine: Secondary | ICD-10-CM

## 2024-01-27 DIAGNOSIS — L900 Lichen sclerosus et atrophicus: Secondary | ICD-10-CM | POA: Diagnosis not present

## 2024-01-27 DIAGNOSIS — L2082 Flexural eczema: Secondary | ICD-10-CM | POA: Diagnosis not present

## 2024-01-27 MED ORDER — TRIAMCINOLONE ACETONIDE 0.1 % EX OINT: 1.0000 | TOPICAL_OINTMENT | Freq: Two times a day (BID) | CUTANEOUS | 0 refills | Status: DC

## 2024-01-27 MED ORDER — CLOBETASOL PROPIONATE 0.05 % EX OINT: 1.0000 | TOPICAL_OINTMENT | Freq: Two times a day (BID) | CUTANEOUS | 0 refills | Status: DC

## 2024-01-27 NOTE — Progress Notes (Signed)
 GYNECOLOGY  VISIT  HPI: 73 y.o. No obstetric history on file. Married [place] or [place] American female here for new problem gyn visit. Patient reports vulvar,perineal itching for approx one year or longer. She experiences itching around labia majora, minora, perineum, up into hairline, sometimes to rectal region. Itching sometimes worse at night. She has intense urge to scratch. Patient also shows provider eczematous patches on both arms (insides of elbows bilaterally) and on neck. Patient reports these patches flare up intermittently. They are itchy as well. Pt also reports abnormal urine to odor (will send for culture).    Past Medical History:  Diagnosis Date   Headache    Low back pain    SBO (small bowel obstruction) (HCC) 2010   Smoldering multiple myeloma     MEDS:   Current Outpatient Medications on File Prior to Visit  Medication Sig Dispense Refill   acetaminophen (TYLENOL 8 HOUR) 650 MG CR tablet Take 650 mg by mouth every 8 (eight) hours as needed for pain.     OVER THE COUNTER MEDICATION Vit D with K     vitamin B-12 (CYANOCOBALAMIN) 1000 MCG tablet Take 1,000 mcg by mouth daily.      clobetasol cream (TEMOVATE) 0.05 % Apply 1 Application topically 2 (two) times daily. USE FOR 2 WEEKS then STOP (Patient not taking: Reported on 01/27/2024) 45 g 3   fluconazole (DIFLUCAN) 150 MG tablet Take 1 tablet by mouth today.  May repeat every 3 days up to 2 additional doses for continued symptoms. (Patient not taking: Reported on 01/27/2024) 3 tablet 0   fluticasone (CUTIVATE) 0.05 % cream Apply 1 Application topically daily. (Patient not taking: Reported on 01/27/2024)     lidocaine-prilocaine (EMLA) cream Apply 1 application. topically as needed. (Patient not taking: Reported on 01/27/2024) 30 g 2   nystatin-triamcinolone (MYCOLOG II) cream Apply 1 Application topically 2 (two) times daily. (Patient not taking: Reported on 01/27/2024)     Probiotic Product (PROBIOTIC DAILY PO) Take 2  capsules by mouth daily. Gummies (Patient not taking: Reported on 01/27/2024)     tacrolimus (PROTOPIC) 0.1 % ointment Apply topically 2 (two) times daily. (Patient not taking: Reported on 01/27/2024) 100 g 3   trimethoprim (TRIMPEX) 100 MG tablet Take 1 tablet (100 mg total) by mouth daily. (Patient not taking: Reported on 01/27/2024) 30 tablet 0   Current Facility-Administered Medications on File Prior to Visit  Medication Dose Route Frequency Provider Last Rate Last Admin   sodium chloride flush (NS) 0.9 % injection 10 mL  10 mL Intracatheter PRN Johney Maine, MD   10 mL at 05/11/19 1437    ALLERGIES: Codeine  Review of Systems  Genitourinary:        +abnormal odor to urine, vulvar itching, perineal itching   PHYSICAL EXAMINATION:    BP (!) 146/80 (BP Location: Right Arm, Patient Position: Sitting, Cuff Size: Large)   Pulse 78   Ht 5\' 1"  (1.549 m)   Wt 209 lb 6.4 oz (95 kg)   BMI 39.57 kg/m     General appearance: alert, cooperative and appears stated age  Pelvic: External genitalia:  no lesions noted. Some whitening noted in hour-glass pattern seems c/w lichen sclerosus (vulvar) involving perineum and around anus              Urethra:  normal appearing urethra with no masses, tenderness or lesions              Bartholins and Skenes: normal  Vagina:  atrophic, no erythema noted vaginally              Cervix: absent              Bimanual Exam:  Uterus:  uterus absent               Chaperone, CMA, was present for exam.  Assessment/Plan: 1. Abnormal urine odor (Primary) - Urine Culture  2. Flexural eczema - Inside elbows bilaterally - triamcinolone ointment (KENALOG) 0.1 %; Apply 1 Application topically 2 (two) times daily.  Dispense: 453 g; Refill: 0  3. Lichen sclerosus - Pt will plan follow-up in 1 month to evaluate effectiveness of Temovate on resolving vulvar itching. - clobetasol ointment (TEMOVATE) 0.05 %; Apply 1 Application topically 2 (two)  times daily. Apply as directed twice daily for 2 weeks then 2-3 times weekly vulva perineum  Dispense: 180 g; Refill: 0  Abe Abed K Bracken Moffa

## 2024-01-28 ENCOUNTER — Other Ambulatory Visit: Payer: Self-pay

## 2024-01-29 DIAGNOSIS — D49511 Neoplasm of unspecified behavior of right kidney: Secondary | ICD-10-CM | POA: Diagnosis not present

## 2024-01-29 DIAGNOSIS — D3001 Benign neoplasm of right kidney: Secondary | ICD-10-CM | POA: Diagnosis not present

## 2024-01-29 DIAGNOSIS — N390 Urinary tract infection, site not specified: Secondary | ICD-10-CM | POA: Diagnosis not present

## 2024-01-30 LAB — URINE CULTURE

## 2024-02-05 ENCOUNTER — Other Ambulatory Visit (HOSPITAL_BASED_OUTPATIENT_CLINIC_OR_DEPARTMENT_OTHER): Payer: Self-pay

## 2024-02-05 DIAGNOSIS — R8290 Unspecified abnormal findings in urine: Secondary | ICD-10-CM

## 2024-02-06 ENCOUNTER — Other Ambulatory Visit (HOSPITAL_BASED_OUTPATIENT_CLINIC_OR_DEPARTMENT_OTHER)

## 2024-02-06 DIAGNOSIS — R8290 Unspecified abnormal findings in urine: Secondary | ICD-10-CM

## 2024-02-07 ENCOUNTER — Other Ambulatory Visit: Payer: Self-pay

## 2024-02-07 DIAGNOSIS — C9001 Multiple myeloma in remission: Secondary | ICD-10-CM

## 2024-02-08 LAB — URINE CULTURE

## 2024-02-10 ENCOUNTER — Inpatient Hospital Stay: Payer: TRICARE For Life (TFL) | Attending: Hematology

## 2024-02-10 ENCOUNTER — Inpatient Hospital Stay (HOSPITAL_BASED_OUTPATIENT_CLINIC_OR_DEPARTMENT_OTHER): Payer: TRICARE For Life (TFL) | Admitting: Hematology

## 2024-02-10 VITALS — BP 142/71 | HR 67 | Resp 18 | Ht 61.0 in | Wt 210.7 lb

## 2024-02-10 DIAGNOSIS — C9000 Multiple myeloma not having achieved remission: Secondary | ICD-10-CM | POA: Diagnosis not present

## 2024-02-10 DIAGNOSIS — C9001 Multiple myeloma in remission: Secondary | ICD-10-CM

## 2024-02-10 DIAGNOSIS — Z452 Encounter for adjustment and management of vascular access device: Secondary | ICD-10-CM | POA: Insufficient documentation

## 2024-02-10 DIAGNOSIS — G629 Polyneuropathy, unspecified: Secondary | ICD-10-CM | POA: Diagnosis not present

## 2024-02-10 DIAGNOSIS — Z95828 Presence of other vascular implants and grafts: Secondary | ICD-10-CM

## 2024-02-10 DIAGNOSIS — D72819 Decreased white blood cell count, unspecified: Secondary | ICD-10-CM | POA: Diagnosis not present

## 2024-02-10 DIAGNOSIS — Z86718 Personal history of other venous thrombosis and embolism: Secondary | ICD-10-CM | POA: Insufficient documentation

## 2024-02-10 DIAGNOSIS — M8580 Other specified disorders of bone density and structure, unspecified site: Secondary | ICD-10-CM | POA: Diagnosis not present

## 2024-02-10 DIAGNOSIS — E559 Vitamin D deficiency, unspecified: Secondary | ICD-10-CM | POA: Diagnosis not present

## 2024-02-10 DIAGNOSIS — Z87440 Personal history of urinary (tract) infections: Secondary | ICD-10-CM | POA: Diagnosis not present

## 2024-02-10 LAB — CMP (CANCER CENTER ONLY)
ALT: 24 U/L (ref 0–44)
AST: 22 U/L (ref 15–41)
Albumin: 4.1 g/dL (ref 3.5–5.0)
Alkaline Phosphatase: 69 U/L (ref 38–126)
Anion gap: 4 — ABNORMAL LOW (ref 5–15)
BUN: 16 mg/dL (ref 8–23)
CO2: 26 mmol/L (ref 22–32)
Calcium: 9.2 mg/dL (ref 8.9–10.3)
Chloride: 108 mmol/L (ref 98–111)
Creatinine: 0.81 mg/dL (ref 0.44–1.00)
GFR, Estimated: >60 mL/min (ref >60)
Glucose, Bld: 84 mg/dL (ref 70–99)
Potassium: 3.9 mmol/L (ref 3.5–5.1)
Sodium: 138 mmol/L (ref 135–145)
Total Bilirubin: 0.9 mg/dL (ref 0.0–1.2)
Total Protein: 7.1 g/dL (ref 6.5–8.1)

## 2024-02-10 LAB — CBC WITH DIFFERENTIAL (CANCER CENTER ONLY)
Abs Immature Granulocytes: 0.01 10*3/uL (ref 0.00–0.07)
Basophils Absolute: 0 10*3/uL (ref 0.0–0.1)
Basophils Relative: 1 %
Eosinophils Absolute: 0 10*3/uL (ref 0.0–0.5)
Eosinophils Relative: 0 %
HCT: 34.5 % — ABNORMAL LOW (ref 36.0–46.0)
Hemoglobin: 11.8 g/dL — ABNORMAL LOW (ref 12.0–15.0)
Immature Granulocytes: 0 %
Lymphocytes Relative: 48 %
Lymphs Abs: 2 10*3/uL (ref 0.7–4.0)
MCH: 32.6 pg (ref 26.0–34.0)
MCHC: 34.2 g/dL (ref 30.0–36.0)
MCV: 95.3 fL (ref 80.0–100.0)
Monocytes Absolute: 0.4 10*3/uL (ref 0.1–1.0)
Monocytes Relative: 9 %
Neutro Abs: 1.7 10*3/uL (ref 1.7–7.7)
Neutrophils Relative %: 42 %
Platelet Count: 169 10*3/uL (ref 150–400)
RBC: 3.62 MIL/uL — ABNORMAL LOW (ref 3.87–5.11)
RDW: 11.9 % (ref 11.5–15.5)
WBC Count: 4.1 10*3/uL (ref 4.0–10.5)
nRBC: 0 % (ref 0.0–0.2)

## 2024-02-10 LAB — IRON AND IRON BINDING CAPACITY (CC-WL,HP ONLY)
Iron: 100 ug/dL (ref 28–170)
Saturation Ratios: 28 % (ref 10.4–31.8)
TIBC: 356 ug/dL (ref 250–450)
UIBC: 256 ug/dL (ref 148–442)

## 2024-02-10 LAB — FERRITIN: Ferritin: 85 ng/mL (ref 11–307)

## 2024-02-10 MED ORDER — SODIUM CHLORIDE 0.9% FLUSH
10.0000 mL | Freq: Once | INTRAVENOUS | Status: AC
Start: 2024-02-10 — End: 2024-02-10
  Administered 2024-02-10: 10 mL

## 2024-02-10 MED ORDER — HEPARIN SOD (PORK) LOCK FLUSH 100 UNIT/ML IV SOLN
500.0000 [IU] | Freq: Once | INTRAVENOUS | Status: AC
Start: 2024-02-10 — End: 2024-02-10
  Administered 2024-02-10: 500 [IU]

## 2024-02-10 NOTE — Progress Notes (Signed)
 HEMATOLOGY/ONCOLOGY CLINIC NOTE  Date of Service: 02/10/2024   PCP: Mira Amend MD  CHIEF COMPLAINTS/PURPOSE OF CONSULTATION:  Follow-up for continued evaluation and management of multiple myeloma  HISTORY OF PRESENTING ILLNESS:  See previous note for details on initial presentation  INTERVAL HISTORY:  Madison Parker is a 73 y.o. female who is here to continue evaluation and management of multiple myeloma.  Patient was last seen by me on 11/11/2023 and she complained of lower back pain and urinary symptoms with itchiness around pelvic area. She was referred to Urologist for recurrent UTI.   Patient notes she has been doing well overall since our last visit. She regularly follows-up with her Urologist regarding recurrent UTI. She had a recent ultrasound, which showed a small cyst in her kidneys. She is going to have another scans in May with her Urologist.   Patient did have other recurrent UTI's since our last visit.  She denies any new infection issues, fever, chills, night sweats, unexpected weight loss, chest pain, abdominal pain, or leg swelling. She does complain of chronic mild lower back pain.   Patient also complains of intermittent bilateral lower extremities numbness, mainly left lower extremity.   MEDICAL HISTORY:  Past Medical History:  Diagnosis Date   Headache    Low back pain    SBO (small bowel obstruction) (HCC) 2010   Smoldering multiple myeloma   Previous history of hypothyroidism- not currently medications Anxiety Obesity .There is no height or weight on file to calculate BMI. 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 Lumbosacral radiculopathy Left hip pain  SURGICAL HISTORY: Past Surgical History:  Procedure Laterality Date   ABDOMINAL HYSTERECTOMY     complete   BACK SURGERY     lower at [facility]   COLONOSCOPY WITH PROPOFOL  N/A 08/23/2020   Procedure: COLONOSCOPY WITH PROPOFOL ;   Surgeon: Urban Garden, MD;  [facility]: [facility];  Service: Gastroenterology;  Laterality: N/A;  900   COLONOSCOPY WITH PROPOFOL  N/A 08/28/2023   Procedure: COLONOSCOPY WITH PROPOFOL ;  Surgeon: Urban Garden, MD;  Location: [facility];  Service: Gastroenterology;  Laterality: N/A;  815am, asa 1-2   colonscopy  2014   ESOPHAGOGASTRODUODENOSCOPY (EGD) WITH PROPOFOL  N/A 08/28/2023   Procedure: ESOPHAGOGASTRODUODENOSCOPY (EGD) WITH PROPOFOL ;  Surgeon: Urban Garden, MD;  Location: [facility];  Service: Gastroenterology;  Laterality: N/A;   GASTRIC BYPASS  yrs ago   HERNIA REPAIR     IR IMAGING GUIDED PORT INSERTION  04/20/2019   KNEE SURGERY  06/04/2009   both knees replaced    POLYPECTOMY  08/23/2020   Procedure: POLYPECTOMY;  Surgeon: Urban Garden, MD;  Location: [facility];  Service: Gastroenterology;;   POLYPECTOMY  08/28/2023   Procedure: POLYPECTOMY INTESTINAL;  Surgeon: Urban Garden, MD;  Location: [facility];  Service: Gastroenterology;;   TONSILLECTOMY  age 44   TOTAL HIP ARTHROPLASTY Left 08/16/2017   Procedure: LEFT TOTAL HIP ARTHROPLASTY ANTERIOR APPROACH;  Surgeon: Neil Balls, MD;  Location: [facility];  Service: Orthopedics;  Laterality: Left;  Spinal surgery April 2017  SOCIAL HISTORY: Social History   Socioeconomic History   Marital status: Married    Spouse name: Not on file   Number of children: Not on file   Years of education: Not on file   Highest education level: Not on file  Occupational History   Not on file  Tobacco Use   Smoking status: Former  Current packs/day: 0.50    Average packs/day: 0.5 packs/day for 10.0 years (5.0 ttl pk-yrs)    Types: Cigarettes   Smokeless tobacco: Never   Tobacco comments:    quit 1977  Vaping Use   Vaping status: Never Used  Substance and Sexual Activity   Alcohol use: Yes    Comment: occ   Drug use: No   Sexual activity: Not on file  Other Topics  Concern   Not on file  Social History Narrative   Not on file   Social Drivers of Health   Financial Resource Strain: Not on file  Food Insecurity: Not on file  Transportation Needs: Not on file  Physical Activity: Not on file  Stress: Not on file  Social Connections: Not on file  Intimate Partner Violence: Not At Risk (03/22/2021)   Humiliation, Afraid, Rape, and Kick questionnaire    Fear of Current or Ex-Partner: No    Emotionally Abused: No    Physically Abused: No    Sexually Abused: No  Occasional alcohol use Former smoker and smoked 1 pack per week for about 9 years has since quit.  FAMILY HISTORY: Family History  Problem Relation Age of Onset   Breast cancer Neg Hx     ALLERGIES:  is allergic to codeine.  MEDICATIONS:  Current Outpatient Medications  Medication Sig Dispense Refill   acetaminophen  (TYLENOL  8 HOUR) 650 MG CR tablet Take 650 mg by mouth every 8 (eight) hours as needed for pain.     clobetasol  cream (TEMOVATE ) 0.05 % Apply 1 Application topically 2 (two) times daily. USE FOR 2 WEEKS then STOP (Patient not taking: Reported on 01/27/2024) 45 g 3   clobetasol  ointment (TEMOVATE ) 0.05 % Apply 1 Application topically 2 (two) times daily. Apply as directed twice daily for 2 weeks then 2-3 times weekly vulva perineum 180 g 0   fluconazole  (DIFLUCAN ) 150 MG tablet Take 1 tablet by mouth today.  May repeat every 3 days up to 2 additional doses for continued symptoms. (Patient not taking: Reported on 01/27/2024) 3 tablet 0   fluticasone (CUTIVATE) 0.05 % cream Apply 1 Application topically daily. (Patient not taking: Reported on 01/27/2024)     lidocaine -prilocaine  (EMLA ) cream Apply 1 application. topically as needed. (Patient not taking: Reported on 01/27/2024) 30 g 2   nystatin -triamcinolone  (MYCOLOG II) cream Apply 1 Application topically 2 (two) times daily. (Patient not taking: Reported on 01/27/2024)     OVER THE COUNTER MEDICATION Vit D with K     Probiotic  Product (PROBIOTIC DAILY PO) Take 2 capsules by mouth daily. Gummies (Patient not taking: Reported on 01/27/2024)     tacrolimus  (PROTOPIC ) 0.1 % ointment Apply topically 2 (two) times daily. (Patient not taking: Reported on 01/27/2024) 100 g 3   triamcinolone  ointment (KENALOG ) 0.1 % Apply 1 Application topically 2 (two) times daily. 453 g 0   trimethoprim  (TRIMPEX ) 100 MG tablet Take 1 tablet (100 mg total) by mouth daily. (Patient not taking: Reported on 01/27/2024) 30 tablet 0   vitamin B-12 (CYANOCOBALAMIN ) 1000 MCG tablet Take 1,000 mcg by mouth daily.      No current facility-administered medications for this visit.   Facility-Administered Medications Ordered in Other Visits  Medication Dose Route Frequency Provider Last Rate Last Admin   sodium chloride  flush (NS) 0.9 % injection 10 mL  10 mL Intracatheter PRN Frankie Israel, MD   10 mL at 05/11/19 1437    REVIEW OF SYSTEMS:   10 Point review  of Systems was done is negative except as noted above.   PHYSICAL EXAMINATION:  .BP (!) 142/71 (BP Location: Left Arm, Patient Position: Sitting)   Pulse 67   Resp 18   Ht 5\' 1"  (1.549 m)   Wt 210 lb 11.2 oz (95.6 kg)   SpO2 100%   BMI 39.81 kg/m   GENERAL:alert, in no acute distress and comfortable SKIN: no acute rashes, no significant lesions EYES: conjunctiva are pink and non-injected, sclera anicteric OROPHARYNX: MMM, no exudates, no oropharyngeal erythema or ulceration NECK: supple, no JVD LYMPH:  no palpable lymphadenopathy in the cervical, axillary or inguinal regions LUNGS: clear to auscultation b/l with normal respiratory effort HEART: regular rate & rhythm ABDOMEN:  normoactive bowel sounds , non tender, not distended. Extremity: no pedal edema PSYCH: alert & oriented x 3 with fluent speech NEURO: no focal motor/sensory deficits  LABORATORY DATA:  I have reviewed the data as listed  .    Latest Ref Rng & Units 02/10/2024    9:41 AM 11/11/2023   10:02 AM  08/12/2023   10:09 AM  CBC  WBC 4.0 - 10.5 K/uL 4.1  4.2  4.4   Hemoglobin 12.0 - 15.0 g/dL 14.7  82.9  56.2   Hematocrit 36.0 - 46.0 % 34.5  35.4  34.5   Platelets 150 - 400 K/uL 169  180  175    CBC    Component Value Date/Time   WBC 4.1 02/10/2024 0941   WBC 3.3 (L) 10/04/2021 0902   RBC 3.62 (L) 02/10/2024 0941   HGB 11.8 (L) 02/10/2024 0941   HGB 11.3 (L) 09/30/2017 1145   HCT 34.5 (L) 02/10/2024 0941   HCT 35.1 09/30/2017 1145   PLT 169 02/10/2024 0941   PLT 172 09/30/2017 1145   MCV 95.3 02/10/2024 0941   MCV 97.5 09/30/2017 1145   MCH 32.6 02/10/2024 0941   MCHC 34.2 02/10/2024 0941   RDW 11.9 02/10/2024 0941   RDW 14.0 09/30/2017 1145   LYMPHSABS 2.0 02/10/2024 0941   LYMPHSABS 1.3 09/30/2017 1145   MONOABS 0.4 02/10/2024 0941   MONOABS 0.2 09/30/2017 1145   EOSABS 0.0 02/10/2024 0941   EOSABS 0.0 09/30/2017 1145   EOSABS 0.1 06/03/2014 1003   BASOSABS 0.0 02/10/2024 0941   BASOSABS 0.0 09/30/2017 1145     .    Latest Ref Rng & Units 02/10/2024    9:41 AM 08/12/2023   10:09 AM 05/29/2023   10:35 AM  CMP  Glucose 70 - 99 mg/dL 84  85  86   BUN 8 - 23 mg/dL 16  11  15    Creatinine 0.44 - 1.00 mg/dL 1.30  8.65  7.84   Sodium 135 - 145 mmol/L 138  138  140   Potassium 3.5 - 5.1 mmol/L 3.9  4.0  3.8   Chloride 98 - 111 mmol/L 108  108  111   CO2 22 - 32 mmol/L 26  25  25    Calcium 8.9 - 10.3 mg/dL 9.2  9.4  8.5   Total Protein 6.5 - 8.1 g/dL 7.1  6.7  6.6   Total Bilirubin 0.0 - 1.2 mg/dL 0.9  0.8  0.7   Alkaline Phos 38 - 126 U/L 69  76  77   AST 15 - 41 U/L 22  19  19    ALT 0 - 44 U/L 24  20  24     03/11/19 BM Bx:   03/11/19 Cytogenetics:  RADIOGRAPHIC STUDIES: I have personally reviewed the radiological images as listed and agreed with the findings in the report. MM 3D SCREENING MAMMOGRAM BILATERAL BREAST Result Date: 01/16/2024 CLINICAL DATA:  Screening. EXAM: DIGITAL SCREENING BILATERAL MAMMOGRAM WITH TOMOSYNTHESIS AND CAD  TECHNIQUE: Bilateral screening digital craniocaudal and mediolateral oblique mammograms were obtained. Bilateral screening digital breast tomosynthesis was performed. The images were evaluated with computer-aided detection. COMPARISON:  Previous exam(s). ACR Breast Density Category b: There are scattered areas of fibroglandular density. FINDINGS: There are no findings suspicious for malignancy. IMPRESSION: No mammographic evidence of malignancy. A result letter of this screening mammogram will be mailed directly to the patient. RECOMMENDATION: Screening mammogram in one year. (Code:SM-B-01Y) BI-RADS CATEGORY  1: Negative. Electronically Signed   By: Roda Cirri M.D.   On: 01/16/2024 15:15     Bone survey 11/08/2016 IMPRESSION: 1. Small lytic lesion in the right scapula. 2. Possible small lytic lesion in the left scapula. 3. Postoperative changes. 4. Significant degenerative changes in both hips, left greater than right. 5. No evidence for acute fracture     Electronically Signed   By: Anitra Barn M.D.   On: 11/08/2016 16:19   ASSESSMENT & PLAN:   73 y.o. very pleasant lady with history of   1) R-ISS 3 light chain multiple myeloma (concern for small lytic lesions in left and right scapulae) - (appears light chain producing) and Progressive anemia This was apparently diagnosed as smoldering myeloma in 2011 by her hematologist in [place] . No renal failure/hypercalcemia. Bone survey with concern for possible small lytic lesions in B/L scapulae. PET/CT scan on 03/12/2017 showed no concerning bone lesions. Initial Bm Bx with 17% kappa restricted plasma cells consistent with plasma cell neoplasm. No treatment so far. 03/11/19 BM Bx revealed involvement by 50% plasma cells; genetics -high risk t(14;16), previous genetics from April 2018 BM Bx were standard risk 03/16/19 MRI Lumbar reveals several explanations for her lower back pain including L2-L3 stenosis, but reassuringly, there  was not evidence of involvement by multiple myeloma 04/01/19 PET/CT which revealed no FDG evidence of active multiple myeloma within the skeleton. No evidence of lytic lesions within the skeleton or soft tissue Plasmacytoma. 05/07/2019 DXA scan revealed osteopenia, T-score of -1.2.  2) Chronic leukopenia - ? Related to SMM vs other nutritional deficiencies (h/o gastric bypass surgery put her at risk for nutritional deficiencies).  WBC counts of have been close to normal recently today at 3.3k with an ANC of 1200. B12 levels WNL Ferritin adequate ?additional factor -recent NSAID use. ?element of benign ethnic neutropenia. Has not had any issues with frequent infection.   3) Neuropathy/radiculopathy -Continue follow up with orthopedics  4) history of superficial Venous Thrombosis of the left small saphenous vein  06/24/2019 US  venous left : Right: There is no evidence of deep vein thrombosis in the lower extremity. No cystic structure found in the popliteal fossa. Left: Findings consistent with acute superficial vein thrombosis involving the left small saphenous vein. There is no evidence of deep vein thrombosis in the lower extremity.   PLAN: -Discussed lab results from today, 02/10/2024, in detail with the patient. CBC shows slightly low Hgb of 11.8 g/dL with Hct of 29.5%, but stable overall. Cmp stable.  -Continue to follow-up with OBGYN and Urologist.  -Patient notes no acute new clinical concerns suggestive of myeloma progression at this time. -Patient has no lab or clinical evidence of myeloma progression at this time and continues to desire staying off maintenance treatments at this time. -  Answered all of patient's questions.  -Recommend influenza vaccine, COVID-19 Booster, RSV vaccine, and other age-related vaccines.      FOLLOW-UP: Portflush and labs and MD visit in 3 months  The total time spent in the appointment was 23 minutes* .  All of the patient's questions were answered  with apparent satisfaction. The patient knows to call the clinic with any problems, questions or concerns.   Jacquelyn Matt MD MS AAHIVMS [facility] Hematology/Oncology Physician [facility]  .*Total Encounter Time as defined by the Centers for Medicare and Medicaid Services includes, in addition to the face-to-face time of a patient visit (documented in the note above) non-face-to-face time: obtaining and reviewing outside history, ordering and reviewing medications, tests or procedures, care coordination (communications with other health care professionals or caregivers) and documentation in the medical record.   I,Param Shah,acting as a Neurosurgeon for Jacquelyn Matt, MD.,have documented all relevant documentation on the behalf of Jacquelyn Matt, MD,as directed by  Jacquelyn Matt, MD while in the presence of Jacquelyn Matt, MD.  .I have reviewed the above documentation for accuracy and completeness, and I agree with the above. .Sevin Farone Kishore Markel Mergenthaler MD

## 2024-02-11 ENCOUNTER — Telehealth: Payer: Self-pay | Admitting: Hematology

## 2024-02-11 LAB — KAPPA/LAMBDA LIGHT CHAINS
Kappa free light chain: 21.3 mg/L — ABNORMAL HIGH (ref 3.3–19.4)
Kappa, lambda light chain ratio: 1.16 (ref 0.26–1.65)
Lambda free light chains: 18.4 mg/L (ref 5.7–26.3)

## 2024-02-11 NOTE — Telephone Encounter (Signed)
 Left patient a vm regarding upcoming appointment

## 2024-02-12 ENCOUNTER — Other Ambulatory Visit: Payer: Self-pay

## 2024-02-12 LAB — MULTIPLE MYELOMA PANEL, SERUM
Albumin SerPl Elph-Mcnc: 3.2 g/dL (ref 2.9–4.4)
Albumin/Glob SerPl: 1 (ref 0.7–1.7)
Alpha 1: 0.2 g/dL (ref 0.0–0.4)
Alpha2 Glob SerPl Elph-Mcnc: 0.6 g/dL (ref 0.4–1.0)
B-Globulin SerPl Elph-Mcnc: 1.1 g/dL (ref 0.7–1.3)
Gamma Glob SerPl Elph-Mcnc: 1.3 g/dL (ref 0.4–1.8)
Globulin, Total: 3.3 g/dL (ref 2.2–3.9)
IgA: 180 mg/dL (ref 64–422)
IgG (Immunoglobin G), Serum: 1406 mg/dL (ref 586–1602)
IgM (Immunoglobulin M), Srm: 49 mg/dL (ref 26–217)
Total Protein ELP: 6.5 g/dL (ref 6.0–8.5)

## 2024-02-17 ENCOUNTER — Encounter: Payer: Self-pay | Admitting: Hematology

## 2024-02-27 ENCOUNTER — Ambulatory Visit (HOSPITAL_BASED_OUTPATIENT_CLINIC_OR_DEPARTMENT_OTHER): Admitting: Certified Nurse Midwife

## 2024-03-03 ENCOUNTER — Ambulatory Visit (HOSPITAL_BASED_OUTPATIENT_CLINIC_OR_DEPARTMENT_OTHER): Admitting: Certified Nurse Midwife

## 2024-03-03 DIAGNOSIS — L900 Lichen sclerosus et atrophicus: Secondary | ICD-10-CM | POA: Diagnosis not present

## 2024-03-03 MED ORDER — CLOBETASOL PROPIONATE 0.05 % EX OINT: 1.0000 | TOPICAL_OINTMENT | Freq: Two times a day (BID) | CUTANEOUS | 1 refills | Status: AC

## 2024-03-03 MED ORDER — ESTRADIOL 0.1 MG/GM VA CREA: TOPICAL_CREAM | VAGINAL | 6 refills | Status: AC

## 2024-03-03 NOTE — Progress Notes (Signed)
      Expand All Collapse All   GYNECOLOGY  VISIT   HPI: 73 y.o. No obstetric history on file. Married [place] or [place] American female here for new problem gyn visit. Patient reports vulvar,perineal itching for approx one year or longer. She experiences itching around labia majora, minora, perineum, up into hairline, sometimes to rectal region. Itching sometimes worse at night. She has intense urge to scratch. Patient also shows provider eczematous patches on both arms (insides of elbows bilaterally) and on neck. Patient reports these patches flare up intermittently. They are itchy as well. Pt also reports abnormal urine to odor (will send for culture).       Subjective:     Madison Parker is a 73 y.o. female who presents for follow-up from April. At that visit pt reported vulvar and perineal itching for one year or longer. Itching around labia majora, minora, perineum, up to hairline, sometimes around rectum. Itching worse at night. Intense urge to scratch.  Today pt states she used Clobetasol  ointment twice daily for 2 weeks. Itching resolved and she felt better. After several days of not using the ointment, the itching recurred. She started using the ointment again, and is now using the ointment twice weekly. Flexural eczema responded to Triamcinolone  and is not currently flaring.  The following portions of the patient's history were reviewed and updated as appropriate: allergies, current medications, past family history, past medical history, past social history, past surgical history, and problem list.   Review of Systems Pertinent items are noted in HPI.    Objective:    BP (!) 141/84 (BP Location: Left Arm, Patient Position: Sitting, Cuff Size: Large)   Pulse 79   Ht 5\' 1"  (1.549 m) Comment: Reported  Wt 213 lb 9.6 oz (96.9 kg)   BMI 40.36 kg/m  General appearance: alert, cooperative, and appears stated age Pelvic: no vulvar erythema noted, vaginal atrophy present     Assessment:    Lichen Sclerosus (responded to Temovate ).  Vaginal Atrophy   Plan:   Pt will continue Clobetasol  twice weekly. She will RTO in 2-3 months. We will consider transitioning to Mometasone at that time if flares have stopped. May use vaginal estrogen 1gram twice weekly for vaginal atrophy. Yolanda Hence

## 2024-03-06 DIAGNOSIS — D49511 Neoplasm of unspecified behavior of right kidney: Secondary | ICD-10-CM | POA: Diagnosis not present

## 2024-03-06 DIAGNOSIS — K5730 Diverticulosis of large intestine without perforation or abscess without bleeding: Secondary | ICD-10-CM | POA: Diagnosis not present

## 2024-04-09 ENCOUNTER — Other Ambulatory Visit: Payer: Self-pay

## 2024-05-11 ENCOUNTER — Other Ambulatory Visit: Payer: Self-pay

## 2024-05-11 DIAGNOSIS — C9001 Multiple myeloma in remission: Secondary | ICD-10-CM

## 2024-05-11 NOTE — Progress Notes (Signed)
 HEMATOLOGY/ONCOLOGY CLINIC NOTE  Date of Service: 05/12/2024   PCP: Dayton Arts MD  CHIEF COMPLAINTS/PURPOSE OF CONSULTATION:  Follow-up for continued evaluation and management of multiple myeloma  HISTORY OF PRESENTING ILLNESS:  See previous note for details on initial presentation  INTERVAL HISTORY:  Madison Parker is a 73 y.o. female who is here to continue evaluation and management of multiple myeloma.  Patient was last seen by me on 02/10/2024 and reported recurrent UTIs, chronic mild lower back pain, and intermittent bilateral lower extremities numbness, mainly in left lower extremity.   She reports that she is doingw ell overal with no new concern over the last 3 months.   She continues to be on Clobetasol  steroid cream for her skin rashes. Patient follows with Delon Lenis, DO. Patient notes that there are plans for a biopsy for further evaluation.   She reports that her skin rashes do respond to steroid cream. She notes that on some days she needs to use steroid cream daily.   Patient notes some weight gain.   She denies any breathing issues, changes in bowel habits habits,  unexplained fever, chills, night sweats, other infection issues, new shoulder pain, or abdominal pain.   Patient reports occasional mild leg swelling.   She reports stable bone pain with no new changes. Patient notes that she does sleep on her side sometimes, which causes some left shoulder pain.   She denies starting any new medications recently. Patient reports that she is taking vitamin D  regularly.   She continues to be on estradiol  cream by her OB/GYN.   MEDICAL HISTORY:  Past Medical History:  Diagnosis Date   Headache    Low back pain    SBO (small bowel obstruction) (HCC) 2010   Smoldering multiple myeloma   Previous history of hypothyroidism- not currently medications Anxiety Obesity .Body mass index is 41.42 kg/m. Vitamin D  deficiency Smoldering multiple myeloma diagnosed  in 2011 with active myeloma on treatment. Lumbosacral radiculopathy Left hip pain  SURGICAL HISTORY: Past Surgical History:  Procedure Laterality Date   ABDOMINAL HYSTERECTOMY     complete   BACK SURGERY     lower at [facility]   COLONOSCOPY WITH PROPOFOL  N/A 08/23/2020   Procedure: COLONOSCOPY WITH PROPOFOL ;  Surgeon: Eartha Angelia Sieving, MD;  Location: [facility];  Service: Gastroenterology;  Laterality: N/A;  900   COLONOSCOPY WITH PROPOFOL  N/A 08/28/2023   Procedure: COLONOSCOPY WITH PROPOFOL ;  Surgeon: Eartha Angelia Sieving, MD;  Location: [facility];  Service: Gastroenterology;  Laterality: N/A;  815am, asa 1-2   colonscopy  2014   ESOPHAGOGASTRODUODENOSCOPY (EGD) WITH PROPOFOL  N/A 08/28/2023   Procedure: ESOPHAGOGASTRODUODENOSCOPY (EGD) WITH PROPOFOL ;  Surgeon: Eartha Angelia Sieving, MD;  Location: [facility];  Service: Gastroenterology;  Laterality: N/A;   GASTRIC BYPASS  yrs ago   HERNIA REPAIR     IR IMAGING GUIDED PORT INSERTION  04/20/2019   KNEE SURGERY  06/04/2009   both knees replaced    POLYPECTOMY  08/23/2020   Procedure: POLYPECTOMY;  Surgeon: Eartha Angelia Sieving, MD;  Location: [facility];  Service: Gastroenterology;;   POLYPECTOMY  08/28/2023   Procedure: POLYPECTOMY INTESTINAL;  Surgeon: Eartha Angelia Sieving, MD;  Location: [facility];  Service: Gastroenterology;;   TONSILLECTOMY  age 43   TOTAL HIP ARTHROPLASTY Left 08/16/2017   Procedure: LEFT TOTAL HIP ARTHROPLASTY ANTERIOR APPROACH;  Surgeon: Yvone Rush, MD;  Location: [facility];  Service: Orthopedics;  Laterality: Left;  Spinal surgery April 2017  SOCIAL HISTORY: Social History   Socioeconomic History   Marital status: Married    Spouse name: Not on file   Number of children: Not on file   Years of education: Not on file   Highest education level: Not on file  Occupational History   Not on file  Tobacco Use   Smoking status: Former    Current packs/day: 0.50     Average packs/day: 0.5 packs/day for 10.0 years (5.0 ttl pk-yrs)    Types: Cigarettes   Smokeless tobacco: Never   Tobacco comments:    quit 1977  Vaping Use   Vaping status: Never Used  Substance and Sexual Activity   Alcohol use: Yes    Comment: occ   Drug use: No   Sexual activity: Not on file  Other Topics Concern   Not on file  Social History Narrative   Not on file   Social Drivers of Health   Financial Resource Strain: Not on file  Food Insecurity: Not on file  Transportation Needs: Not on file  Physical Activity: Not on file  Stress: Not on file  Social Connections: Not on file  Intimate Partner Violence: Not At Risk (03/22/2021)   Humiliation, Afraid, Rape, and Kick questionnaire    Fear of Current or Ex-Partner: No    Emotionally Abused: No    Physically Abused: No    Sexually Abused: No  Occasional alcohol use Former smoker and smoked 1 pack per week for about 9 years has since quit.  FAMILY HISTORY: Family History  Problem Relation Age of Onset   Breast cancer Neg Hx     ALLERGIES:  is allergic to codeine.  MEDICATIONS:  Current Outpatient Medications  Medication Sig Dispense Refill   acetaminophen  (TYLENOL  8 HOUR) 650 MG CR tablet Take 650 mg by mouth every 8 (eight) hours as needed for pain.     clobetasol  ointment (TEMOVATE ) 0.05 % Apply 1 Application topically 2 (two) times daily. Apply twice weekly 60 g 1   estradiol  (ESTRACE ) 0.1 MG/GM vaginal cream 1 gram vaginally twice weekly 42.5 g 6   lidocaine -prilocaine  (EMLA ) cream Apply 1 application. topically as needed. 30 g 2   OVER THE COUNTER MEDICATION Vit D with K     vitamin B-12 (CYANOCOBALAMIN ) 1000 MCG tablet Take 1,000 mcg by mouth daily.      No current facility-administered medications for this visit.   Facility-Administered Medications Ordered in Other Visits  Medication Dose Route Frequency Provider Last Rate Last Admin   sodium chloride  flush (NS) 0.9 % injection 10 mL  10 mL  Intracatheter PRN Onesimo Emaline Brink, MD   10 mL at 05/11/19 1437    REVIEW OF SYSTEMS:   10 Point review of Systems was done is negative except as noted above.   PHYSICAL EXAMINATION:  .BP 133/80   Pulse 65   Temp 97.9 F (36.6 C)   Resp 20   Wt 219 lb 3.2 oz (99.4 kg)   SpO2 100%   BMI 41.42 kg/m   GENERAL:alert, in no acute distress and comfortable SKIN: no acute rashes, no significant lesions EYES: conjunctiva are pink and non-injected, sclera anicteric OROPHARYNX: MMM, no exudates, no oropharyngeal erythema or ulceration NECK: supple, no JVD LYMPH:  no palpable lymphadenopathy in the cervical, axillary or inguinal regions LUNGS: clear to auscultation b/l with normal respiratory effort HEART: regular rate & rhythm ABDOMEN:  normoactive bowel sounds , non tender, not distended. Extremity: no pedal edema PSYCH: alert & oriented x  3 with fluent speech NEURO: no focal motor/sensory deficits   LABORATORY DATA:  I have reviewed the data as listed  .    Latest Ref Rng & Units 05/12/2024    9:38 AM 02/10/2024    9:41 AM 11/11/2023   10:02 AM  CBC  WBC 4.0 - 10.5 K/uL 4.1  4.1  4.2   Hemoglobin 12.0 - 15.0 g/dL 88.2  88.1  88.1   Hematocrit 36.0 - 46.0 % 35.0  34.5  35.4   Platelets 150 - 400 K/uL 158  169  180    CBC    Component Value Date/Time   WBC 4.1 05/12/2024 0938   WBC 3.3 (L) 10/04/2021 0902   RBC 3.63 (L) 05/12/2024 0938   HGB 11.7 (L) 05/12/2024 0938   HGB 11.3 (L) 09/30/2017 1145   HCT 35.0 (L) 05/12/2024 0938   HCT 35.1 09/30/2017 1145   PLT 158 05/12/2024 0938   PLT 172 09/30/2017 1145   MCV 96.4 05/12/2024 0938   MCV 97.5 09/30/2017 1145   MCH 32.2 05/12/2024 0938   MCHC 33.4 05/12/2024 0938   RDW 11.4 (L) 05/12/2024 0938   RDW 14.0 09/30/2017 1145   LYMPHSABS 2.0 05/12/2024 0938   LYMPHSABS 1.3 09/30/2017 1145   MONOABS 0.4 05/12/2024 0938   MONOABS 0.2 09/30/2017 1145   EOSABS 0.0 05/12/2024 0938   EOSABS 0.0 09/30/2017 1145   EOSABS  0.1 06/03/2014 1003   BASOSABS 0.0 05/12/2024 0938   BASOSABS 0.0 09/30/2017 1145     .    Latest Ref Rng & Units 05/12/2024    9:38 AM 02/10/2024    9:41 AM 08/12/2023   10:09 AM  CMP  Glucose 70 - 99 mg/dL 83  84  85   BUN 8 - 23 mg/dL 13  16  11    Creatinine 0.44 - 1.00 mg/dL 9.21  9.18  9.18   Sodium 135 - 145 mmol/L 140  138  138   Potassium 3.5 - 5.1 mmol/L 3.8  3.9  4.0   Chloride 98 - 111 mmol/L 110  108  108   CO2 22 - 32 mmol/L 26  26  25    Calcium 8.9 - 10.3 mg/dL 8.9  9.2  9.4   Total Protein 6.5 - 8.1 g/dL 6.9  7.1  6.7   Total Bilirubin 0.0 - 1.2 mg/dL 0.9  0.9  0.8   Alkaline Phos 38 - 126 U/L 71  69  76   AST 15 - 41 U/L 20  22  19    ALT 0 - 44 U/L 21  24  20     03/11/19 BM Bx:   03/11/19 Cytogenetics:            RADIOGRAPHIC STUDIES: I have personally reviewed the radiological images as listed and agreed with the findings in the report. No results found.    Bone survey 11/08/2016 IMPRESSION: 1. Small lytic lesion in the right scapula. 2. Possible small lytic lesion in the left scapula. 3. Postoperative changes. 4. Significant degenerative changes in both hips, left greater than right. 5. No evidence for acute fracture     Electronically Signed   By: Almarie Daring M.D.   On: 11/08/2016 16:19   ASSESSMENT & PLAN:   73 y.o. very pleasant lady with history of   1) R-ISS 3 light chain multiple myeloma (concern for small lytic lesions in left and right scapulae) - (appears light chain producing) and Progressive anemia This was apparently diagnosed as  smoldering myeloma in 2011 by her hematologist in [place] . No renal failure/hypercalcemia. Bone survey with concern for possible small lytic lesions in B/L scapulae. PET/CT scan on 03/12/2017 showed no concerning bone lesions. Initial Bm Bx with 17% kappa restricted plasma cells consistent with plasma cell neoplasm. No treatment so far. 03/11/19 BM Bx revealed involvement by 50%  plasma cells; genetics -high risk t(14;16), previous genetics from April 2018 BM Bx were standard risk 03/16/19 MRI Lumbar reveals several explanations for her lower back pain including L2-L3 stenosis, but reassuringly, there was not evidence of involvement by multiple myeloma 04/01/19 PET/CT which revealed no FDG evidence of active multiple myeloma within the skeleton. No evidence of lytic lesions within the skeleton or soft tissue Plasmacytoma. 05/07/2019 DXA scan revealed osteopenia, T-score of -1.2.  2) Chronic leukopenia - ? Related to SMM vs other nutritional deficiencies (h/o gastric bypass surgery put her at risk for nutritional deficiencies).  WBC counts of have been close to normal recently today at 3.3k with an ANC of 1200. B12 levels WNL Ferritin adequate ?additional factor -recent NSAID use. ?element of benign ethnic neutropenia. Has not had any issues with frequent infection.   3) Neuropathy/radiculopathy -Continue follow up with orthopedics  4) history of superficial Venous Thrombosis of the left small saphenous vein  06/24/2019 US  venous left : Right: There is no evidence of deep vein thrombosis in the lower extremity. No cystic structure found in the popliteal fossa. Left: Findings consistent with acute superficial vein thrombosis involving the left small saphenous vein. There is no evidence of deep vein thrombosis in the lower extremity.   PLAN:  -Discussed lab results on 05/12/2024 in detail with patient.  -labs today are fairly normal -CBC showed WBC of 4.1K, hemoglobin of 11.7, and platelets of 158K. -myeloma labs and iron  labs from today show no evidence of myeloma progressiion with undetectable M spike and normal SFLC ratio. -her last myeloma panel from 02/10/2024 continued to show undetectable M protein and showed that patient continues to be in remission -did not feel any enlarged lymph nodes during physical examination -no clinical sign or lab evidence suggestive of  myeloma progression at this time -continue to follow up with Delon Lenis, DO for evaluation and management of skin rashes.   -discussed that her steroid cream for skin rashes should not have too much systemic absorption  -continue to follow-up with OB/GYN -will repeat labs in 3 months -assuming her remaining labs from today are normal, given that she has been in remission for while, discussed option to alternate our visits every 3 months with phone visits every other time. Patient is agreeable.  -answered all of patient's questions in detail   FOLLOW-UP: Labs in 12 weeks Phone visit with Dr Onesimo in 14 weeks  The total time spent in the appointment was 23 minutes* .  All of the patient's questions were answered with apparent satisfaction. The patient knows to call the clinic with any problems, questions or concerns.   Emaline Onesimo MD MS AAHIVMS [facility] Hematology/Oncology Physician [facility]  .*Total Encounter Time as defined by the Centers for Medicare and Medicaid Services includes, in addition to the face-to-face time of a patient visit (documented in the note above) non-face-to-face time: obtaining and reviewing outside history, ordering and reviewing medications, tests or procedures, care coordination (communications with other health care professionals or caregivers) and documentation in the medical record.    I,Mitra Faeizi,acting as a Neurosurgeon for Emaline Onesimo, MD.,have documented all relevant  documentation on the behalf of Emaline Saran, MD,as directed by  Emaline Saran, MD while in the presence of Emaline Saran, MD.  .I have reviewed the above documentation for accuracy and completeness, and I agree with the above. .Molly Maselli Kishore Esbeidy Mclaine MD

## 2024-05-12 ENCOUNTER — Inpatient Hospital Stay (HOSPITAL_BASED_OUTPATIENT_CLINIC_OR_DEPARTMENT_OTHER): Admitting: Hematology

## 2024-05-12 ENCOUNTER — Inpatient Hospital Stay: Attending: Hematology

## 2024-05-12 VITALS — BP 133/80 | HR 65 | Temp 97.9°F | Resp 20 | Wt 219.2 lb

## 2024-05-12 DIAGNOSIS — M8580 Other specified disorders of bone density and structure, unspecified site: Secondary | ICD-10-CM | POA: Diagnosis not present

## 2024-05-12 DIAGNOSIS — D72819 Decreased white blood cell count, unspecified: Secondary | ICD-10-CM | POA: Diagnosis not present

## 2024-05-12 DIAGNOSIS — R21 Rash and other nonspecific skin eruption: Secondary | ICD-10-CM | POA: Diagnosis not present

## 2024-05-12 DIAGNOSIS — G629 Polyneuropathy, unspecified: Secondary | ICD-10-CM | POA: Diagnosis not present

## 2024-05-12 DIAGNOSIS — C9001 Multiple myeloma in remission: Secondary | ICD-10-CM | POA: Diagnosis not present

## 2024-05-12 DIAGNOSIS — Z86718 Personal history of other venous thrombosis and embolism: Secondary | ICD-10-CM | POA: Diagnosis not present

## 2024-05-12 DIAGNOSIS — C9000 Multiple myeloma not having achieved remission: Secondary | ICD-10-CM | POA: Diagnosis not present

## 2024-05-12 DIAGNOSIS — Z87440 Personal history of urinary (tract) infections: Secondary | ICD-10-CM | POA: Insufficient documentation

## 2024-05-12 LAB — CMP (CANCER CENTER ONLY)
ALT: 21 U/L (ref 0–44)
AST: 20 U/L (ref 15–41)
Albumin: 3.8 g/dL (ref 3.5–5.0)
Alkaline Phosphatase: 71 U/L (ref 38–126)
Anion gap: 4 — ABNORMAL LOW (ref 5–15)
BUN: 13 mg/dL (ref 8–23)
CO2: 26 mmol/L (ref 22–32)
Calcium: 8.9 mg/dL (ref 8.9–10.3)
Chloride: 110 mmol/L (ref 98–111)
Creatinine: 0.78 mg/dL (ref 0.44–1.00)
GFR, Estimated: >60 mL/min (ref >60)
Glucose, Bld: 83 mg/dL (ref 70–99)
Potassium: 3.8 mmol/L (ref 3.5–5.1)
Sodium: 140 mmol/L (ref 135–145)
Total Bilirubin: 0.9 mg/dL (ref 0.0–1.2)
Total Protein: 6.9 g/dL (ref 6.5–8.1)

## 2024-05-12 LAB — IRON AND IRON BINDING CAPACITY (CC-WL,HP ONLY)
Iron: 97 ug/dL (ref 28–170)
Saturation Ratios: 28 % (ref 10.4–31.8)
TIBC: 343 ug/dL (ref 250–450)
UIBC: 246 ug/dL (ref 148–442)

## 2024-05-12 LAB — FERRITIN: Ferritin: 75 ng/mL (ref 11–307)

## 2024-05-12 LAB — CBC WITH DIFFERENTIAL (CANCER CENTER ONLY)
Abs Immature Granulocytes: 0.01 K/uL (ref 0.00–0.07)
Basophils Absolute: 0 K/uL (ref 0.0–0.1)
Basophils Relative: 1 %
Eosinophils Absolute: 0 K/uL (ref 0.0–0.5)
Eosinophils Relative: 1 %
HCT: 35 % — ABNORMAL LOW (ref 36.0–46.0)
Hemoglobin: 11.7 g/dL — ABNORMAL LOW (ref 12.0–15.0)
Immature Granulocytes: 0 %
Lymphocytes Relative: 47 %
Lymphs Abs: 2 K/uL (ref 0.7–4.0)
MCH: 32.2 pg (ref 26.0–34.0)
MCHC: 33.4 g/dL (ref 30.0–36.0)
MCV: 96.4 fL (ref 80.0–100.0)
Monocytes Absolute: 0.4 K/uL (ref 0.1–1.0)
Monocytes Relative: 9 %
Neutro Abs: 1.7 K/uL (ref 1.7–7.7)
Neutrophils Relative %: 42 %
Platelet Count: 158 K/uL (ref 150–400)
RBC: 3.63 MIL/uL — ABNORMAL LOW (ref 3.87–5.11)
RDW: 11.4 % — ABNORMAL LOW (ref 11.5–15.5)
WBC Count: 4.1 K/uL (ref 4.0–10.5)
nRBC: 0 % (ref 0.0–0.2)

## 2024-05-13 LAB — KAPPA/LAMBDA LIGHT CHAINS
Kappa free light chain: 21.8 mg/L — ABNORMAL HIGH (ref 3.3–19.4)
Kappa, lambda light chain ratio: 1.54 (ref 0.26–1.65)
Lambda free light chains: 14.2 mg/L (ref 5.7–26.3)

## 2024-05-14 ENCOUNTER — Other Ambulatory Visit: Payer: Self-pay

## 2024-05-14 LAB — MULTIPLE MYELOMA PANEL, SERUM
Albumin SerPl Elph-Mcnc: 3.3 g/dL (ref 2.9–4.4)
Albumin/Glob SerPl: 1.2 (ref 0.7–1.7)
Alpha 1: 0.2 g/dL (ref 0.0–0.4)
Alpha2 Glob SerPl Elph-Mcnc: 0.5 g/dL (ref 0.4–1.0)
B-Globulin SerPl Elph-Mcnc: 1 g/dL (ref 0.7–1.3)
Gamma Glob SerPl Elph-Mcnc: 1.2 g/dL (ref 0.4–1.8)
Globulin, Total: 3 g/dL (ref 2.2–3.9)
IgA: 156 mg/dL (ref 64–422)
IgG (Immunoglobin G), Serum: 1218 mg/dL (ref 586–1602)
IgM (Immunoglobulin M), Srm: 56 mg/dL (ref 26–217)
Total Protein ELP: 6.3 g/dL (ref 6.0–8.5)

## 2024-05-18 ENCOUNTER — Encounter: Payer: Self-pay | Admitting: Hematology

## 2024-07-29 ENCOUNTER — Encounter (INDEPENDENT_AMBULATORY_CARE_PROVIDER_SITE_OTHER): Payer: Self-pay | Admitting: Gastroenterology

## 2024-07-31 ENCOUNTER — Other Ambulatory Visit: Payer: Self-pay

## 2024-07-31 DIAGNOSIS — C9001 Multiple myeloma in remission: Secondary | ICD-10-CM

## 2024-08-03 ENCOUNTER — Inpatient Hospital Stay: Attending: Hematology

## 2024-08-03 DIAGNOSIS — C9000 Multiple myeloma not having achieved remission: Secondary | ICD-10-CM | POA: Insufficient documentation

## 2024-08-03 DIAGNOSIS — C9001 Multiple myeloma in remission: Secondary | ICD-10-CM

## 2024-08-03 LAB — CMP (CANCER CENTER ONLY)
ALT: 20 U/L (ref 0–44)
AST: 19 U/L (ref 15–41)
Albumin: 3.9 g/dL (ref 3.5–5.0)
Alkaline Phosphatase: 66 U/L (ref 38–126)
Anion gap: 6 (ref 5–15)
BUN: 14 mg/dL (ref 8–23)
CO2: 26 mmol/L (ref 22–32)
Calcium: 9.7 mg/dL (ref 8.9–10.3)
Chloride: 107 mmol/L (ref 98–111)
Creatinine: 0.84 mg/dL (ref 0.44–1.00)
GFR, Estimated: >60 mL/min (ref >60)
Glucose, Bld: 92 mg/dL (ref 70–99)
Potassium: 3.9 mmol/L (ref 3.5–5.1)
Sodium: 139 mmol/L (ref 135–145)
Total Bilirubin: 0.9 mg/dL (ref 0.0–1.2)
Total Protein: 7.1 g/dL (ref 6.5–8.1)

## 2024-08-03 LAB — FERRITIN: Ferritin: 80 ng/mL (ref 11–307)

## 2024-08-03 LAB — CBC WITH DIFFERENTIAL (CANCER CENTER ONLY)
Abs Immature Granulocytes: 0.01 K/uL (ref 0.00–0.07)
Basophils Absolute: 0 K/uL (ref 0.0–0.1)
Basophils Relative: 0 %
Eosinophils Absolute: 0 K/uL (ref 0.0–0.5)
Eosinophils Relative: 1 %
HCT: 36.4 % (ref 36.0–46.0)
Hemoglobin: 12 g/dL (ref 12.0–15.0)
Immature Granulocytes: 0 %
Lymphocytes Relative: 43 %
Lymphs Abs: 2.1 K/uL (ref 0.7–4.0)
MCH: 31.7 pg (ref 26.0–34.0)
MCHC: 33 g/dL (ref 30.0–36.0)
MCV: 96.3 fL (ref 80.0–100.0)
Monocytes Absolute: 0.4 K/uL (ref 0.1–1.0)
Monocytes Relative: 8 %
Neutro Abs: 2.3 K/uL (ref 1.7–7.7)
Neutrophils Relative %: 48 %
Platelet Count: 176 K/uL (ref 150–400)
RBC: 3.78 MIL/uL — ABNORMAL LOW (ref 3.87–5.11)
RDW: 11.9 % (ref 11.5–15.5)
WBC Count: 4.9 K/uL (ref 4.0–10.5)
nRBC: 0 % (ref 0.0–0.2)

## 2024-08-03 LAB — IRON AND IRON BINDING CAPACITY (CC-WL,HP ONLY)
Iron: 105 ug/dL (ref 28–170)
Saturation Ratios: 30 % (ref 10.4–31.8)
TIBC: 356 ug/dL (ref 250–450)
UIBC: 251 ug/dL (ref 148–442)

## 2024-08-04 LAB — KAPPA/LAMBDA LIGHT CHAINS
Kappa free light chain: 25.3 mg/L — ABNORMAL HIGH (ref 3.3–19.4)
Kappa, lambda light chain ratio: 1.47 (ref 0.26–1.65)
Lambda free light chains: 17.2 mg/L (ref 5.7–26.3)

## 2024-08-06 LAB — MULTIPLE MYELOMA PANEL, SERUM
Albumin SerPl Elph-Mcnc: 3.5 g/dL (ref 2.9–4.4)
Albumin/Glob SerPl: 1.1 (ref 0.7–1.7)
Alpha 1: 0.2 g/dL (ref 0.0–0.4)
Alpha2 Glob SerPl Elph-Mcnc: 0.7 g/dL (ref 0.4–1.0)
B-Globulin SerPl Elph-Mcnc: 1.1 g/dL (ref 0.7–1.3)
Gamma Glob SerPl Elph-Mcnc: 1.3 g/dL (ref 0.4–1.8)
Globulin, Total: 3.3 g/dL (ref 2.2–3.9)
IgA: 193 mg/dL (ref 64–422)
IgG (Immunoglobin G), Serum: 1315 mg/dL (ref 586–1602)
IgM (Immunoglobulin M), Srm: 69 mg/dL (ref 26–217)
Total Protein ELP: 6.8 g/dL (ref 6.0–8.5)

## 2024-08-24 ENCOUNTER — Inpatient Hospital Stay: Attending: Hematology | Admitting: Hematology

## 2024-08-24 DIAGNOSIS — Z87891 Personal history of nicotine dependence: Secondary | ICD-10-CM | POA: Diagnosis not present

## 2024-08-24 DIAGNOSIS — Z86718 Personal history of other venous thrombosis and embolism: Secondary | ICD-10-CM | POA: Diagnosis not present

## 2024-08-24 DIAGNOSIS — C9001 Multiple myeloma in remission: Secondary | ICD-10-CM | POA: Diagnosis not present

## 2024-08-24 DIAGNOSIS — E039 Hypothyroidism, unspecified: Secondary | ICD-10-CM | POA: Diagnosis not present

## 2024-08-24 DIAGNOSIS — D649 Anemia, unspecified: Secondary | ICD-10-CM | POA: Insufficient documentation

## 2024-08-24 DIAGNOSIS — C9000 Multiple myeloma not having achieved remission: Secondary | ICD-10-CM | POA: Diagnosis not present

## 2024-08-24 NOTE — Progress Notes (Signed)
 HEMATOLOGY ONCOLOGY PHONE VISIT NOTE  Date of service: 08/24/2024  Patient Care Team: Shona Norleen PEDLAR, MD as PCP - General (Internal Medicine) Onesimo Emaline Brink, MD as Consulting Physician (Hematology) Yvone Norleen, MD as Consulting Physician (Orthopedic Surgery)  CHIEF COMPLAINT/PURPOSE OF CONSULTATION: Follow-up for continued evaluation and management of Multiple Myeloma  HISTORY OF PRESENTING ILLNESS: (11/08/2016) Madison Parker is a wonderful 73 y.o. female who has been referred to us  by Dr Dayton Arts  for evaluation and management of smoldering multiple myeloma.   Patient has a history of smoldering multiple myeloma which she reports she was diagnosed with in 2011 when she was evaluated by a hematologist in [place] . She reports that she presented with low blood counts (low WBC)   and after significant workup she had a bone marrow examination based on which she was told that she has smoldering multiple myeloma. Patient notes that she had a bone survey at that time which was negative. She was monitored there closely and then moved to [facility]  in 2015 to stay with her son whose wife was being treated for breast cancer. Patient was following with Dr. Evalene Reusing in [place]. She reports not having had any treatment for multiple myeloma.   Her last labs from about a year ago showed a SPEP with no OBSERVED M spike . Serum free light chains showed an elevation of kappa Free light chain 310 and lambda free light chain of 12.7 with an abnormal Kappa/ Lambda ratio of 24.38 ( up from 19 previously). Random urine showed an M protein complement of 44%.   She lives in [place] and requested transfer of care to us . She was offered follow-up but [facility] in [place]  But prefers to  follow us  in [place]. Patient reports her energy levels been stable. She has chronic back pain which has improved since the patient had her spinal surgery in April 2017  (L4-L5 interbody fusion). Patient notes she has had some chronic left hip pain which is somewhat more bothersome with the navy on the lateral aspect of her upper thighs that's painful. She also notes some right hip pain. Over the last few weeks he notes some upper neck pain especially when bending her neck backwards and tingling numbness in her right upper extremity.   Has been working on losing some weight voluntarily.  No acute other new symptoms.  SUMMARY OF ONCOLOGIC HISTORY: Oncology History  Multiple myeloma not having achieved remission (HCC)  04/16/2019 Initial Diagnosis   Multiple myeloma not having achieved remission (HCC)   04/27/2019 -  Chemotherapy   Patient is on Treatment Plan : MYELOMA SALVAGE Carfilzomib  [id number]     02/07/2022 Cancer Staging   Staging form: Plasma Cell Myeloma and Plasma Cell Disorders, AJCC 8th Edition - Clinical: Beta-2 -microglobulin (mg/L): 1.9, Albumin (g/dL): 4, ISS: Stage I, High-risk cytogenetics: Present, LDH: Unknown - Signed by Onesimo Emaline Brink, MD on 02/07/2022 Stage prefix: Initial diagnosis Beta 2 microglobulin range (mg/L): Less than 3.5 Albumin range (g/dL): Greater than or equal to 3.5 Cytogenetics: t(14;16) translocation Serum calcium level: Normal    INTERVAL HISTORY: I connected with Madison Parker on 08/24/2024 at  8:40 AM EST by telephone and verified that I am speaking with the correct person using two identifiers.   I discussed the limitations, risks, security and privacy concerns of performing an evaluation and management service by telemedicine and the availability of in-person appointments. I also discussed with the patient that there may be a patient  responsible charge related to this service. The patient expressed understanding and agreed to proceed.   Other persons participating in the visit and their role in the encounter: Medical Scribe, Damien Blanks.  Patient's location: Home Provider's location: [facility]   Chief Complaint:  continued evaluation and management of Multiple Myeloma  She was last seen by me on 05/12/2024; at the time she mentioned experiencing some weight gain and occasional leg swelling.   Today, she says that she has been well. She expresses some concern about her elevated Kappa Light Chains.  She states that was given a cream (Clobetasol  and Estradiol ) for her lichen sclerosus, which helps some. Does endorses experiencing cramping in her legs, which she does not feel to be contributed by dehydration or low electrolytes as she drinks plenty of water  with added salt. She notes that she has stopped taking Vitamin B12.  REVIEW OF SYSTEMS:   10 Point review of systems of done and is negative except as noted above.  MEDICAL HISTORY Past Medical History:  Diagnosis Date   Headache    Low back pain    SBO (small bowel obstruction) (HCC) 2010   Smoldering multiple myeloma   Previous history of hypothyroidism- not currently medications Anxiety Obesity .Body mass index is 38.23 kg/m. Vitamin D  deficiency Smoldering multiple myeloma diagnosed in 2011 Lumbosacral radiculopathy Left hip pain  SURGICAL HISTORY Past Surgical History:  Procedure Laterality Date   ABDOMINAL HYSTERECTOMY     complete   BACK SURGERY     lower at [facility]   COLONOSCOPY WITH PROPOFOL  N/A 08/23/2020   Procedure: COLONOSCOPY WITH PROPOFOL ;  Surgeon: Eartha Angelia Sieving, MD;  [facility];  Service: Gastroenterology;  Laterality: N/A;  900   COLONOSCOPY WITH PROPOFOL  N/A 08/28/2023   Procedure: COLONOSCOPY WITH PROPOFOL ;  Surgeon: Eartha Angelia Sieving, MD;  Location: [facility];  Service: Gastroenterology;  Laterality: N/A;  815am, asa 1-2   colonscopy  2014   ESOPHAGOGASTRODUODENOSCOPY (EGD) WITH PROPOFOL  N/A 08/28/2023   Procedure: ESOPHAGOGASTRODUODENOSCOPY (EGD) WITH PROPOFOL ;  Surgeon: Eartha Angelia Sieving, MD;  [facility]: [facility];  Service: Gastroenterology;  Laterality: N/A;   GASTRIC  BYPASS  yrs ago   HERNIA REPAIR     IR IMAGING GUIDED PORT INSERTION  04/20/2019   KNEE SURGERY  06/04/2009   both knees replaced    POLYPECTOMY  08/23/2020   Procedure: POLYPECTOMY;  Surgeon: Eartha Angelia Sieving, MD;  [facility]: [facility];  Service: Gastroenterology;;   POLYPECTOMY  08/28/2023   Procedure: POLYPECTOMY INTESTINAL;  Surgeon: Eartha Angelia Sieving, MD;  Location: [facility];  Service: Gastroenterology;;   TONSILLECTOMY  age 57   TOTAL HIP ARTHROPLASTY Left 08/16/2017   Procedure: LEFT TOTAL HIP ARTHROPLASTY ANTERIOR APPROACH;  Surgeon: Yvone Rush, MD;  Location: [facility];  Service: Orthopedics;  Laterality: Left;  Spinal surgery April 2017    SOCIAL HISTORY Social History   Tobacco Use   Smoking status: Former    Current packs/day: 0.50    Average packs/day: 0.5 packs/day for 10.0 years (5.0 ttl pk-yrs)    Types: Cigarettes   Smokeless tobacco: Never   Tobacco comments:    quit 1977  Vaping Use   Vaping status: Never Used  Substance Use Topics   Alcohol use: Yes    Comment: occ   Drug use: No    Social History   Social History Narrative   Not on file  Occasional alcohol use Former smoker and smoked 1 pack per week for about  9 years has since quit.    SOCIAL DRIVERS OF HEALTH SDOH Screenings   Depression [phone]): Low Risk  (03/03/2024)  Tobacco Use: Medium Risk (01/27/2024)     FAMILY HISTORY Family History  Problem Relation Age of Onset   Breast cancer Neg Hx      ALLERGIES: is allergic to codeine.  MEDICATIONS  Current Outpatient Medications  Medication Sig Dispense Refill   acetaminophen  (TYLENOL  8 HOUR) 650 MG CR tablet Take 650 mg by mouth every 8 (eight) hours as needed for pain.     clobetasol  ointment (TEMOVATE ) 0.05 % Apply 1 Application topically 2 (two) times daily. Apply twice weekly 60 g 1   estradiol  (ESTRACE ) 0.1 MG/GM vaginal cream 1 gram vaginally twice weekly 42.5 g 6   lidocaine -prilocaine  (EMLA ) cream Apply  1 application. topically as needed. 30 g 2   OVER THE COUNTER MEDICATION Vit D with K     vitamin B-12 (CYANOCOBALAMIN ) 1000 MCG tablet Take 1,000 mcg by mouth daily.      No current facility-administered medications for this visit.   Facility-Administered Medications Ordered in Other Visits  Medication Dose Route Frequency Provider Last Rate Last Admin   sodium chloride  flush (NS) 0.9 % injection 10 mL  10 mL Intracatheter PRN Onesimo Emaline Brink, MD   10 mL at 05/11/19 1437    PHYSICAL EXAMINATION -TELEPHONE VISIT  LABORATORY DATA:   I have reviewed the data as listed     Latest Ref Rng & Units 08/03/2024    9:59 AM 05/12/2024    9:38 AM 02/10/2024    9:41 AM  CBC EXTENDED  WBC 4.0 - 10.5 K/uL 4.9  4.1  4.1   RBC 3.87 - 5.11 MIL/uL 3.78  3.63  3.62   Hemoglobin 12.0 - 15.0 g/dL 87.9  88.2  88.1   HCT 36.0 - 46.0 % 36.4  35.0  34.5   Platelets 150 - 400 K/uL 176  158  169   NEUT# 1.7 - 7.7 K/uL 2.3  1.7  1.7   Lymph# 0.7 - 4.0 K/uL 2.1  2.0  2.0     Iron  Studies:  Lab Results  Component Value Date   IRON  105 08/03/2024   UIBC 251 08/03/2024   TIBC 356 08/03/2024   IRONPCTSAT 30 08/03/2024   FERRITIN 80 08/03/2024        Latest Ref Rng & Units 08/03/2024    9:59 AM 05/12/2024    9:38 AM 02/10/2024    9:41 AM  CMP  Glucose 70 - 99 mg/dL 92  83  84   BUN 8 - 23 mg/dL 14  13  16    Creatinine 0.44 - 1.00 mg/dL 9.15  9.21  9.18   Sodium 135 - 145 mmol/L 139  140  138   Potassium 3.5 - 5.1 mmol/L 3.9  3.8  3.9   Chloride 98 - 111 mmol/L 107  110  108   CO2 22 - 32 mmol/L 26  26  26    Calcium 8.9 - 10.3 mg/dL 9.7  8.9  9.2   Total Protein 6.5 - 8.1 g/dL 7.1  6.9  7.1   Total Bilirubin 0.0 - 1.2 mg/dL 0.9  0.9  0.9   Alkaline Phos 38 - 126 U/L 66  71  69   AST 15 - 41 U/L 19  20  22    ALT 0 - 44 U/L 20  21  24     MULTIPLE MYELOMA & KAPPA/LAMBDA LIGHT CHAINS 02/2023 -  10/025  The immunofixation pattern appears unremarkable. Evidence of  monoclonal protein is not  apparent.     03/11/19 BM Bx:    03/11/19 Cytogenetics:                    RADIOGRAPHIC STUDIES: I have personally reviewed the radiological images as listed and agreed with the findings in the report. No results found.   ASSESSMENT & PLAN:  73 y.o. female with  Bone survey 11/08/2016 IMPRESSION: 1. Small lytic lesion in the right scapula. 2. Possible small lytic lesion in the left scapula. 3. Postoperative changes. 4. Significant degenerative changes in both hips, left greater than right. 5. No evidence for acute fracture     Electronically Signed   By: Almarie Daring M.D.   On: 11/08/2016 16:19     ASSESSMENT & PLAN:   73 y.o. very pleasant lady with history of    1) R-ISS 3 light chain multiple myeloma (concern for small lytic lesions in left and right scapulae) - (appears light chain producing) and Progressive anemia This was apparently diagnosed as smoldering myeloma in 2011 by her hematologist in [place] . No renal failure/hypercalcemia. Bone survey with concern for possible small lytic lesions in B/L scapulae. PET/CT scan on 03/12/2017 showed no concerning bone lesions. Initial Bm Bx with 17% kappa restricted plasma cells consistent with plasma cell neoplasm. No treatment so far. 03/11/19 BM Bx revealed involvement by 50% plasma cells; genetics -high risk t(14;16), previous genetics from April 2018 BM Bx were standard risk 03/16/19 MRI Lumbar reveals several explanations for her lower back pain including L2-L3 stenosis, but reassuringly, there was not evidence of involvement by multiple myeloma 04/01/19 PET/CT which revealed no FDG evidence of active multiple myeloma within the skeleton. No evidence of lytic lesions within the skeleton or soft tissue Plasmacytoma. 05/07/2019 DXA scan revealed osteopenia, T-score of -1.2.   2) Chronic leukopenia - ? Related to SMM vs other nutritional deficiencies (h/o gastric bypass surgery put her at risk for  nutritional deficiencies).  WBC counts of have been close to normal recently today at 3.3k with an ANC of 1200. B12 levels WNL Ferritin adequate ?additional factor -recent NSAID use. ?element of benign ethnic neutropenia. Has not had any issues with frequent infection.    3) Neuropathy/radiculopathy -Continue follow up with orthopedics   4) history of superficial Venous Thrombosis of the left small saphenous vein  06/24/2019 US  venous left : Right: There is no evidence of deep vein thrombosis in the lower extremity. No cystic structure found in the popliteal fossa. Left: Findings consistent with acute superficial vein thrombosis involving the left small saphenous vein. There is no evidence of deep vein thrombosis in the lower extremity.    PLAN: - Discussed lab results on 08/03/2024 in detail with patient: CBC stable WBC of 4.9, Hemoglobin of 12, and PLTs of 176K. CMP stable.  M protein undetectable  Kappa/Lambda Lights Chains with borderline elevated Kappa Light Chains, but normal K/L Ratio Iron  Panel and Ferritin: Iron % 30 and Ferritin 80. -no evidence of myeloma progression at this time. -continue to monitor off maintenance treatment as per patients wishes - I explained that her cramping could be contributed by dehydration, low electrolytes, her radiculopathy. Suggested she resume Vitamin B12 and ensure adequate magnesium  and Potassium in diet.  FOLLOW-UP in 12 weeks for labs and in 14 weeks for follow-up with Dr. Onesimo.  The total time spent in the appointment was 20 minutes* .  All of the  patient's questions were answered and the patient knows to call the clinic with any problems, questions, or concerns.  Emaline Saran MD MS AAHIVMS [facility] Hematology/Oncology [facility]ician [facility]  *Total Encounter Time as defined by the Centers for Medicare and Medicaid Services includes, in addition to the face-to-face time of a patient visit (documented in the note above)  non-face-to-face time: obtaining and reviewing outside history, ordering and reviewing medications, tests or procedures, care coordination (communications with other health care professionals or caregivers) and documentation in the medical record.  I,Emily Lagle,acting as a neurosurgeon for Emaline Saran, MD.,have documented all relevant documentation on the behalf of Emaline Saran, MD,as directed by  Emaline Saran, MD while in the presence of Emaline Saran, MD.  I have reviewed the above documentation for accuracy and completeness, and I agree with the above.  Deward Sebek, MD

## 2024-08-26 ENCOUNTER — Other Ambulatory Visit: Payer: Self-pay

## 2024-08-30 ENCOUNTER — Encounter: Payer: Self-pay | Admitting: Hematology

## 2024-11-09 ENCOUNTER — Encounter: Payer: Self-pay | Admitting: Hematology

## 2024-11-11 ENCOUNTER — Other Ambulatory Visit: Payer: Self-pay

## 2024-11-11 DIAGNOSIS — C9001 Multiple myeloma in remission: Secondary | ICD-10-CM

## 2024-11-16 ENCOUNTER — Inpatient Hospital Stay

## 2024-11-19 ENCOUNTER — Inpatient Hospital Stay: Attending: Hematology

## 2024-11-19 ENCOUNTER — Encounter: Payer: Self-pay | Admitting: Hematology

## 2024-11-19 DIAGNOSIS — C9001 Multiple myeloma in remission: Secondary | ICD-10-CM

## 2024-11-19 LAB — CBC WITH DIFFERENTIAL (CANCER CENTER ONLY)
Abs Immature Granulocytes: 0.02 10*3/uL (ref 0.00–0.07)
Basophils Absolute: 0 10*3/uL (ref 0.0–0.1)
Basophils Relative: 1 %
Eosinophils Absolute: 0.1 10*3/uL (ref 0.0–0.5)
Eosinophils Relative: 1 %
HCT: 34.7 % — ABNORMAL LOW (ref 36.0–46.0)
Hemoglobin: 11.6 g/dL — ABNORMAL LOW (ref 12.0–15.0)
Immature Granulocytes: 0 %
Lymphocytes Relative: 36 %
Lymphs Abs: 1.9 10*3/uL (ref 0.7–4.0)
MCH: 32.5 pg (ref 26.0–34.0)
MCHC: 33.4 g/dL (ref 30.0–36.0)
MCV: 97.2 fL (ref 80.0–100.0)
Monocytes Absolute: 0.5 10*3/uL (ref 0.1–1.0)
Monocytes Relative: 10 %
Neutro Abs: 2.7 10*3/uL (ref 1.7–7.7)
Neutrophils Relative %: 52 %
Platelet Count: 166 10*3/uL (ref 150–400)
RBC: 3.57 MIL/uL — ABNORMAL LOW (ref 3.87–5.11)
RDW: 11.6 % (ref 11.5–15.5)
WBC Count: 5.2 10*3/uL (ref 4.0–10.5)
nRBC: 0 % (ref 0.0–0.2)

## 2024-11-19 LAB — FERRITIN: Ferritin: 143 ng/mL (ref 11–307)

## 2024-11-19 LAB — CMP (CANCER CENTER ONLY)
ALT: 28 U/L (ref 0–44)
AST: 19 U/L (ref 15–41)
Albumin: 4 g/dL (ref 3.5–5.0)
Alkaline Phosphatase: 81 U/L (ref 38–126)
Anion gap: 11 (ref 5–15)
BUN: 14 mg/dL (ref 8–23)
CO2: 23 mmol/L (ref 22–32)
Calcium: 9.4 mg/dL (ref 8.9–10.3)
Chloride: 107 mmol/L (ref 98–111)
Creatinine: 0.81 mg/dL (ref 0.44–1.00)
GFR, Estimated: >60 mL/min
Glucose, Bld: 89 mg/dL (ref 70–99)
Potassium: 4 mmol/L (ref 3.5–5.1)
Sodium: 141 mmol/L (ref 135–145)
Total Bilirubin: 0.9 mg/dL (ref 0.0–1.2)
Total Protein: 6.8 g/dL (ref 6.5–8.1)

## 2024-11-19 LAB — IRON AND IRON BINDING CAPACITY (CC-WL,HP ONLY)
Iron: 79 ug/dL (ref 28–170)
Saturation Ratios: 25 % (ref 10.4–31.8)
TIBC: 316 ug/dL (ref 250–450)
UIBC: 238 ug/dL

## 2024-11-20 LAB — KAPPA/LAMBDA LIGHT CHAINS
Kappa free light chain: 32.2 mg/L — ABNORMAL HIGH (ref 3.3–19.4)
Kappa, lambda light chain ratio: 1.73 — ABNORMAL HIGH (ref 0.26–1.65)
Lambda free light chains: 18.6 mg/L (ref 5.7–26.3)

## 2024-11-30 ENCOUNTER — Inpatient Hospital Stay: Admitting: Hematology
